# Patient Record
Sex: Female | Born: 1960 | Race: Black or African American | Hispanic: No | Smoking: Former smoker
Health system: Southern US, Community
[De-identification: ages and names within clinical notes are randomized; demographics above are authoritative.]

## PROBLEM LIST (undated history)

## (undated) DIAGNOSIS — M797 Fibromyalgia: Secondary | ICD-10-CM

## (undated) DIAGNOSIS — M2012 Hallux valgus (acquired), left foot: Secondary | ICD-10-CM

## (undated) DIAGNOSIS — J301 Allergic rhinitis due to pollen: Secondary | ICD-10-CM

## (undated) DIAGNOSIS — E119 Type 2 diabetes mellitus without complications: Secondary | ICD-10-CM

## (undated) DIAGNOSIS — M1990 Unspecified osteoarthritis, unspecified site: Secondary | ICD-10-CM

## (undated) DIAGNOSIS — I639 Cerebral infarction, unspecified: Secondary | ICD-10-CM

## (undated) DIAGNOSIS — N183 Chronic kidney disease, stage 3 unspecified: Secondary | ICD-10-CM

## (undated) DIAGNOSIS — Z87898 Personal history of other specified conditions: Secondary | ICD-10-CM

## (undated) DIAGNOSIS — K219 Gastro-esophageal reflux disease without esophagitis: Secondary | ICD-10-CM

## (undated) DIAGNOSIS — I509 Heart failure, unspecified: Secondary | ICD-10-CM

## (undated) DIAGNOSIS — I10 Essential (primary) hypertension: Secondary | ICD-10-CM

## (undated) DIAGNOSIS — E669 Obesity, unspecified: Secondary | ICD-10-CM

## (undated) DIAGNOSIS — J40 Bronchitis, not specified as acute or chronic: Secondary | ICD-10-CM

## (undated) DIAGNOSIS — E785 Hyperlipidemia, unspecified: Secondary | ICD-10-CM

## (undated) DIAGNOSIS — I428 Other cardiomyopathies: Secondary | ICD-10-CM

## (undated) DIAGNOSIS — F419 Anxiety disorder, unspecified: Secondary | ICD-10-CM

## (undated) DIAGNOSIS — Z8701 Personal history of pneumonia (recurrent): Secondary | ICD-10-CM

## (undated) DIAGNOSIS — M329 Systemic lupus erythematosus, unspecified: Secondary | ICD-10-CM

## (undated) DIAGNOSIS — R51 Headache: Secondary | ICD-10-CM

## (undated) HISTORY — DX: Obesity, unspecified: E66.9

## (undated) HISTORY — DX: Allergic rhinitis due to pollen: J30.1

## (undated) HISTORY — PX: TUBAL LIGATION: SHX77

## (undated) HISTORY — DX: Unspecified osteoarthritis, unspecified site: M19.90

## (undated) HISTORY — DX: Systemic lupus erythematosus, unspecified: M32.9

## (undated) HISTORY — DX: Hallux valgus (acquired), left foot: M20.12

## (undated) HISTORY — DX: Anxiety disorder, unspecified: F41.9

## (undated) HISTORY — DX: Other cardiomyopathies: I42.8

## (undated) HISTORY — PX: OTHER SURGICAL HISTORY: SHX169

---

## 2000-10-31 ENCOUNTER — Emergency Department (HOSPITAL_COMMUNITY): Admission: EM | Admit: 2000-10-31 | Discharge: 2000-10-31 | Payer: Self-pay | Admitting: Internal Medicine

## 2000-10-31 ENCOUNTER — Encounter: Payer: Self-pay | Admitting: Internal Medicine

## 2006-04-13 ENCOUNTER — Encounter: Payer: Self-pay | Admitting: Family Medicine

## 2006-04-20 ENCOUNTER — Encounter: Payer: Self-pay | Admitting: Family Medicine

## 2006-05-20 ENCOUNTER — Encounter: Payer: Self-pay | Admitting: Family Medicine

## 2007-04-07 ENCOUNTER — Emergency Department: Payer: Self-pay | Admitting: Unknown Physician Specialty

## 2009-09-05 ENCOUNTER — Ambulatory Visit: Payer: Self-pay | Admitting: Surgery

## 2010-11-09 HISTORY — PX: US ECHOCARDIOGRAPHY: HXRAD669

## 2010-11-20 ENCOUNTER — Encounter (HOSPITAL_COMMUNITY): Payer: Self-pay | Admitting: Pharmacy Technician

## 2010-11-25 ENCOUNTER — Encounter (HOSPITAL_COMMUNITY): Payer: Self-pay | Admitting: Pharmacy Technician

## 2010-12-05 ENCOUNTER — Encounter (HOSPITAL_COMMUNITY)
Admission: RE | Disposition: A | Discharge status: Home / Self Care | Payer: Self-pay | Source: Ambulatory Visit | Attending: Internal Medicine

## 2010-12-05 ENCOUNTER — Ambulatory Visit (HOSPITAL_COMMUNITY)
Admission: RE | Admit: 2010-12-05 | Discharge: 2010-12-05 | Disposition: A | Discharge status: Home / Self Care | Payer: Medicaid Other | Source: Ambulatory Visit | Attending: Internal Medicine | Admitting: Internal Medicine

## 2010-12-05 DIAGNOSIS — I509 Heart failure, unspecified: Secondary | ICD-10-CM | POA: Insufficient documentation

## 2010-12-05 DIAGNOSIS — I428 Other cardiomyopathies: Secondary | ICD-10-CM | POA: Insufficient documentation

## 2010-12-05 HISTORY — PX: LEFT HEART CATHETERIZATION WITH CORONARY ANGIOGRAM: SHX5451

## 2010-12-05 LAB — GLUCOSE, CAPILLARY
Glucose-Capillary: 125 mg/dL — ABNORMAL HIGH (ref 70–99)
Glucose-Capillary: 118 mg/dL — ABNORMAL HIGH (ref 70–99)

## 2010-12-05 SURGERY — LEFT HEART CATHETERIZATION WITH CORONARY ANGIOGRAM
Anesthesia: LOCAL

## 2010-12-05 MED ORDER — SODIUM CHLORIDE 0.9 % IV SOLN: 1.0000 mL/kg/h | INTRAVENOUS | Status: AC

## 2010-12-05 MED ORDER — SODIUM CHLORIDE 0.9 % IJ SOLN: 3.0000 mL | Freq: Two times a day (BID) | INTRAMUSCULAR | Status: DC

## 2010-12-05 MED ORDER — SODIUM CHLORIDE 0.9 % IJ SOLN: 3.0000 mL | INTRAMUSCULAR | Status: DC | PRN

## 2010-12-05 MED ORDER — VERAPAMIL HCL 2.5 MG/ML IV SOLN
INTRAVENOUS | Status: AC
  Filled 2010-12-05: qty 2

## 2010-12-05 MED ORDER — ASPIRIN 81 MG PO CHEW
CHEWABLE_TABLET | ORAL | Status: AC
  Administered 2010-12-05: 243 mg
  Filled 2010-12-05: qty 3

## 2010-12-05 MED ORDER — HEPARIN (PORCINE) IN NACL 2-0.9 UNIT/ML-% IJ SOLN
INTRAMUSCULAR | Status: AC
  Filled 2010-12-05: qty 2000

## 2010-12-05 MED ORDER — SODIUM CHLORIDE 0.9 % IV SOLN: INTRAVENOUS | Status: DC

## 2010-12-05 MED ORDER — OXYCODONE-ACETAMINOPHEN 5-325 MG PO TABS: 1.0000 | ORAL_TABLET | ORAL | Status: DC | PRN

## 2010-12-05 MED ORDER — ONDANSETRON HCL 4 MG/2ML IJ SOLN: 4.0000 mg | Freq: Four times a day (QID) | INTRAMUSCULAR | Status: DC | PRN

## 2010-12-05 MED ORDER — SODIUM CHLORIDE 0.9 % IV SOLN
INTRAVENOUS | Status: DC
  Administered 2010-12-05: 09:00:00 via INTRAVENOUS

## 2010-12-05 MED ORDER — ACETAMINOPHEN 325 MG PO TABS: 650.0000 mg | ORAL_TABLET | ORAL | Status: DC | PRN

## 2010-12-05 MED ORDER — SODIUM CHLORIDE 0.9 % IV SOLN: 250.0000 mL | INTRAVENOUS | Status: DC

## 2010-12-05 MED ORDER — LIDOCAINE HCL (PF) 1 % IJ SOLN
INTRAMUSCULAR | Status: AC
  Filled 2010-12-05: qty 30

## 2010-12-05 MED ORDER — ASPIRIN 81 MG PO CHEW: 324.0000 mg | CHEWABLE_TABLET | ORAL | Status: DC

## 2010-12-05 MED ORDER — NITROGLYCERIN 0.2 MG/ML ON CALL CATH LAB
INTRAVENOUS | Status: AC
  Filled 2010-12-05: qty 1

## 2010-12-05 MED ORDER — FENTANYL CITRATE 0.05 MG/ML IJ SOLN
INTRAMUSCULAR | Status: AC
  Filled 2010-12-05: qty 2

## 2010-12-05 MED ORDER — MIDAZOLAM HCL 2 MG/2ML IJ SOLN
INTRAMUSCULAR | Status: AC
  Filled 2010-12-05: qty 2

## 2010-12-05 NOTE — Op Note (Signed)
[facility]        CARDIAC CATHETERIZATION REPORT  Katherine Katherine [name]   [id number] 02/14/1960  Performing Cardiologist: Katherine Katherine [name] Primary Physician: No primary provider on file. Primary Cardiologist:  Katherine Katherine [name]  Procedures Performed:  Left Heart Catheterization via 5 Fr right femoral access  Left Ventriculography, (RAO/LAO)  for 100 ml total contrast  Native Coronary Angiography  Indication(s): New onset heart failure  History: 50 y.o. female who was recently presented to [facility] with new onset congestive heart failure. She was discharged for outpatient cardiology follow-up. EF was reportedly 25-30%, dilated, with global hypokinesis. She is referred for cardiac catheterization to r/o CAD.  Consent: Katherine procedure with Risks/Benefits/Alternatives and Indications was reviewed with Katherine Katherine [name].  All questions were answered.    Risks / Complications include, but not limited to: Death, MI, CVA/TIA, VF/VT (with defibrillation), Bradycardia (need for temporary pacer placement), contrast induced nephropathy , bleeding / bruising / hematoma / pseudoaneurysm, vascular or coronary injury (with possible emergent CT or Vascular Surgery), adverse medication reactions, infection.    Katherine Katherine [name] voices understanding and agree to proceed.    Risks of procedure as well as Katherine alternatives and risks of each were explained to Katherine (Katherine [name]/caregiver).  Consent for procedure obtained. Consent for signed by MD and Katherine [name] with RN witness -- placed on chart.  Procedure: Katherine Katherine [name] was brought to Katherine [facility] in Katherine fasting state and prepped and draped in Katherine usual sterile fashion for (Right groin) access.  Sterile technique was used including antiseptics, cap, gloves, gown, hand hygiene, mask and sheet.  Skin prep: Chlorhexidine;  Time Out: Verified Katherine [name] identification, verified procedure, site/side was marked, verified  correct Katherine [name] position, special equipment/implants available, medications/allergies/relevent history reviewed, required imaging and test results available.  Performed  + Allen's test  Katherine right radial artery was identified and Katherine area around it was anesthetized with 1cc of 1% lidocaine. Multiple attempts at accessing Katherine radial artery were made but do to Katherine pulse being weak and Katherine artery fairly deep, it could not be accessed. Attention was then turned to Katherine right groin. Katherine right femoral head was identified using tactile and fluoroscopic technique.  Katherine right groin was anesthetized with 20 cc 1% subcutaneous Lidocaine.  Katherine right Common Femoral Artery was accessed using Katherine Modified Seldinger Technique with placement of a antimicrobial bonded/coated single lumen (5 Fr) sheath was placed in Katherine left femoral artery using a smart ultrasound needle and Katherine Seldinger technique.  Katherine sheath was aspirated and flushed.    A 5 Fr JL4 Catheter was advanced of over a Standard J wire into Katherine ascending Aorta.  Katherine catheter was used to engage Katherine left coronary artery.  Multiple cineangiographic views of Katherine left coronary artery system(s) were performed. A 5 Fr JR4 Catheter was advanced of over a safety J wire into Katherine ascending Aorta.  Katherine catheter was used to engage Katherine right coronary artery.  Multiple cineangiographic views of Katherine right coronary artery system(s) were performed.) This catheter was then exchanged over Katherine safety J wire for an angled Pigtail catheter that was advanced across Katherine Aortic Valve.  LV hemodynamics were measured and Left Ventriculography was performed.  LV hemodynamics were then re-sampled, and Katherine catheter was pulled back across Katherine Aortic Valve for measurement of "pull-back" gradient.  Katherine catheter and Katherine wire was removed completely out of Katherine body.  A final contrast arteriogram was performed of Katherine arteriotomy showing an  adequate access position just below Katherine inguinal ligament.  Based  on this and her large body habitus, I decided to proceed with arterial closure. This was accomplished by Katherine cath lab staff with an Angioseal device. Immediate hemostasis was successful.  Katherine Katherine [name] was transported to Katherine holding area in stable condition.   Katherine Katherine [name]  was stable before, during and following Katherine procedure.   Katherine [name] did tolerate procedure well. There were not complications. EBL: 10 cc  Medications:  Sedation:  2 mg IV Versed, 50 IV mcg Fentanyl  Contrast:  100 ml Omnipaque    Hemodynamics:  Central Aortic Pressure / Mean Aortic Pressure: 148/81  LV Pressure / LV End diastolic Pressure:  18  Left Ventriculography:  EF:  20-25%  Wall Motion: global hypokinesis  Coronary Angiographic Data:  Angiographically normal coronary arteries  Impression: 1.  Angiographically normal coronary arteries 2.  Non-ischemic, dilated cardiomyopathy  Plan: 1.  Will optimize medications and outpatient work-up for NICM.  Katherine case and results was discussed with Katherine Katherine [name]. Katherine case and results was not discussed with Katherine Katherine [name]'s PCP.  Time Spend Directly with Katherine [name]:  60 minutes  Katherine Nose, MD Attending Cardiologist [facility]  Katherine Katherine [name] C 12/05/2010, 12:11 PM

## 2010-12-05 NOTE — H&P (Signed)
Pt. Seen and examined. My office note which is scanned from the past 30 days details the H&P and plans for heart catheterization. There have been no changes since this H&P.  Pt. Signed informed consent and wishes to proceed with LHC today.  Chrystie Nose, MD Attending Cardiologist [facility]

## 2010-12-09 ENCOUNTER — Encounter (HOSPITAL_COMMUNITY): Payer: Self-pay

## 2010-12-09 ENCOUNTER — Other Ambulatory Visit (HOSPITAL_COMMUNITY): Payer: Self-pay | Admitting: Internal Medicine

## 2010-12-18 ENCOUNTER — Other Ambulatory Visit (HOSPITAL_COMMUNITY): Payer: Self-pay | Admitting: Internal Medicine

## 2010-12-18 DIAGNOSIS — D869 Sarcoidosis, unspecified: Secondary | ICD-10-CM

## 2010-12-24 ENCOUNTER — Other Ambulatory Visit: Payer: Self-pay | Admitting: Internal Medicine

## 2010-12-25 ENCOUNTER — Ambulatory Visit (HOSPITAL_COMMUNITY)
Admission: RE | Admit: 2010-12-25 | Discharge: 2010-12-25 | Disposition: A | Discharge status: Home / Self Care | Payer: Medicaid Other | Source: Ambulatory Visit | Attending: Internal Medicine | Admitting: Internal Medicine

## 2010-12-25 DIAGNOSIS — D869 Sarcoidosis, unspecified: Secondary | ICD-10-CM

## 2010-12-25 DIAGNOSIS — I428 Other cardiomyopathies: Secondary | ICD-10-CM | POA: Insufficient documentation

## 2010-12-25 LAB — BASIC METABOLIC PANEL
BUN: 17 mg/dL (ref 6–23)
Calcium: 9.1 mg/dL (ref 8.4–10.5)
GFR calc Af Amer: 90 mL/min — ABNORMAL LOW (ref >90)
GFR calc non Af Amer: 77 mL/min — ABNORMAL LOW (ref >90)
Glucose, Bld: 206 mg/dL — ABNORMAL HIGH (ref 70–99)

## 2010-12-30 MED ORDER — GADOBENATE DIMEGLUMINE 529 MG/ML IV SOLN
30.0000 mL | Freq: Once | INTRAVENOUS | Status: AC
  Administered 2010-12-30: 30 mL via INTRAVENOUS

## 2011-02-09 ENCOUNTER — Other Ambulatory Visit: Payer: Self-pay | Admitting: Cardiovascular Disease

## 2011-02-12 MED ORDER — GENTAMICIN SULFATE 40 MG/ML IJ SOLN
80.0000 mg | INTRAMUSCULAR | Status: DC
  Filled 2011-02-12: qty 2

## 2011-02-12 MED ORDER — SODIUM CHLORIDE 0.9 % IV SOLN
INTRAVENOUS | Status: DC
  Administered 2011-02-13: 10:00:00 via INTRAVENOUS

## 2011-02-12 MED ORDER — SODIUM CHLORIDE 0.45 % IV SOLN: INTRAVENOUS | Status: DC

## 2011-02-12 MED ORDER — CEFAZOLIN SODIUM-DEXTROSE 2-3 GM-% IV SOLR
2.0000 g | INTRAVENOUS | Status: DC
  Filled 2011-02-12: qty 50

## 2011-02-12 MED ORDER — CHLORHEXIDINE GLUCONATE 4 % EX LIQD
60.0000 mL | Freq: Once | CUTANEOUS | Status: DC
  Filled 2011-02-12: qty 60

## 2011-02-13 ENCOUNTER — Encounter (HOSPITAL_COMMUNITY): Payer: Self-pay | Admitting: General Practice

## 2011-02-13 ENCOUNTER — Ambulatory Visit (HOSPITAL_COMMUNITY)
Admission: RE | Admit: 2011-02-13 | Discharge: 2011-02-14 | Disposition: A | Discharge status: Home / Self Care | Payer: Medicaid Other | Source: Ambulatory Visit | Attending: Cardiovascular Disease | Admitting: Cardiovascular Disease

## 2011-02-13 ENCOUNTER — Encounter (HOSPITAL_COMMUNITY)
Admission: RE | Disposition: A | Discharge status: Home / Self Care | Payer: Self-pay | Source: Ambulatory Visit | Attending: Cardiovascular Disease

## 2011-02-13 DIAGNOSIS — I428 Other cardiomyopathies: Secondary | ICD-10-CM | POA: Insufficient documentation

## 2011-02-13 DIAGNOSIS — Z95810 Presence of automatic (implantable) cardiac defibrillator: Secondary | ICD-10-CM

## 2011-02-13 HISTORY — DX: Headache: R51

## 2011-02-13 HISTORY — PX: IMPLANTABLE CARDIOVERTER DEFIBRILLATOR IMPLANT: SHX5473

## 2011-02-13 HISTORY — DX: Essential (primary) hypertension: I10

## 2011-02-13 HISTORY — DX: Heart failure, unspecified: I50.9

## 2011-02-13 HISTORY — DX: Cerebral infarction, unspecified: I63.9

## 2011-02-13 HISTORY — PX: OTHER SURGICAL HISTORY: SHX169

## 2011-02-13 HISTORY — DX: Gastro-esophageal reflux disease without esophagitis: K21.9

## 2011-02-13 HISTORY — DX: Bronchitis, not specified as acute or chronic: J40

## 2011-02-13 HISTORY — DX: Fibromyalgia: M79.7

## 2011-02-13 LAB — SURGICAL PCR SCREEN
MRSA, PCR: NEGATIVE
Staphylococcus aureus: NEGATIVE

## 2011-02-13 SURGERY — IMPLANTABLE CARDIOVERTER DEFIBRILLATOR IMPLANT
Anesthesia: LOCAL

## 2011-02-13 MED ORDER — CEFAZOLIN SODIUM 1-5 GM-% IV SOLN
1.0000 g | Freq: Four times a day (QID) | INTRAVENOUS | Status: AC
  Administered 2011-02-13 – 2011-02-14 (×3): 1 g via INTRAVENOUS
  Filled 2011-02-13 (×3): qty 50

## 2011-02-13 MED ORDER — BUPROPION HCL ER (SR) 150 MG PO TB12
150.0000 mg | ORAL_TABLET | ORAL | Status: DC
  Administered 2011-02-14: 150 mg via ORAL
  Filled 2011-02-13 (×2): qty 1

## 2011-02-13 MED ORDER — SIMVASTATIN 20 MG PO TABS
20.0000 mg | ORAL_TABLET | Freq: Every day | ORAL | Status: DC
  Administered 2011-02-13: 20 mg via ORAL
  Filled 2011-02-13 (×2): qty 1

## 2011-02-13 MED ORDER — ACETAMINOPHEN 325 MG PO TABS
325.0000 mg | ORAL_TABLET | ORAL | Status: DC | PRN
Start: 2011-02-13 — End: 2011-02-14

## 2011-02-13 MED ORDER — LOSARTAN POTASSIUM 50 MG PO TABS
100.0000 mg | ORAL_TABLET | Freq: Every day | ORAL | Status: DC
  Administered 2011-02-14: 100 mg via ORAL
  Filled 2011-02-13 (×2): qty 2

## 2011-02-13 MED ORDER — LORATADINE 10 MG PO TABS
10.0000 mg | ORAL_TABLET | Freq: Every day | ORAL | Status: DC | PRN
  Administered 2011-02-13: 10 mg via ORAL
  Filled 2011-02-13: qty 1

## 2011-02-13 MED ORDER — LORAZEPAM 0.5 MG PO TABS: 0.5000 mg | ORAL_TABLET | Freq: Every day | ORAL | Status: DC | PRN

## 2011-02-13 MED ORDER — HEPARIN (PORCINE) IN NACL 2-0.9 UNIT/ML-% IJ SOLN
INTRAMUSCULAR | Status: AC
  Filled 2011-02-13: qty 1000

## 2011-02-13 MED ORDER — PROMETHAZINE HCL 25 MG PO TABS: 25.0000 mg | ORAL_TABLET | Freq: Three times a day (TID) | ORAL | Status: DC | PRN

## 2011-02-13 MED ORDER — ALBUTEROL SULFATE HFA 108 (90 BASE) MCG/ACT IN AERS
1.0000 | INHALATION_SPRAY | Freq: Four times a day (QID) | RESPIRATORY_TRACT | Status: DC | PRN
  Filled 2011-02-13: qty 6.7

## 2011-02-13 MED ORDER — ASPIRIN EC 81 MG PO TBEC
81.0000 mg | DELAYED_RELEASE_TABLET | Freq: Every day | ORAL | Status: DC
  Administered 2011-02-14: 81 mg via ORAL
  Filled 2011-02-13 (×2): qty 1

## 2011-02-13 MED ORDER — CLONIDINE HCL 0.3 MG PO TABS
0.3000 mg | ORAL_TABLET | Freq: Two times a day (BID) | ORAL | Status: DC
  Administered 2011-02-13 – 2011-02-14 (×2): 0.3 mg via ORAL
  Filled 2011-02-13 (×4): qty 1

## 2011-02-13 MED ORDER — CLOPIDOGREL BISULFATE 75 MG PO TABS
75.0000 mg | ORAL_TABLET | Freq: Every day | ORAL | Status: DC
  Administered 2011-02-14: 75 mg via ORAL
  Filled 2011-02-13 (×2): qty 1

## 2011-02-13 MED ORDER — DIPHENHYDRAMINE HCL 50 MG/ML IJ SOLN
INTRAMUSCULAR | Status: AC
  Filled 2011-02-13: qty 1

## 2011-02-13 MED ORDER — FLUTICASONE-SALMETEROL 250-50 MCG/DOSE IN AEPB
1.0000 | INHALATION_SPRAY | RESPIRATORY_TRACT | Status: DC
  Administered 2011-02-14: 1 via RESPIRATORY_TRACT
  Filled 2011-02-13: qty 14

## 2011-02-13 MED ORDER — FENTANYL CITRATE 0.05 MG/ML IJ SOLN
INTRAMUSCULAR | Status: AC
  Filled 2011-02-13: qty 2

## 2011-02-13 MED ORDER — HYDROCODONE-ACETAMINOPHEN 5-325 MG PO TABS
1.0000 | ORAL_TABLET | ORAL | Status: DC | PRN
  Administered 2011-02-13 – 2011-02-14 (×4): 2 via ORAL
  Filled 2011-02-13 (×4): qty 2

## 2011-02-13 MED ORDER — GABAPENTIN 300 MG PO CAPS
300.0000 mg | ORAL_CAPSULE | Freq: Three times a day (TID) | ORAL | Status: DC
  Administered 2011-02-13 – 2011-02-14 (×3): 300 mg via ORAL
  Filled 2011-02-13 (×5): qty 1

## 2011-02-13 MED ORDER — CEFAZOLIN SODIUM-DEXTROSE 2-3 GM-% IV SOLR
INTRAVENOUS | Status: AC
  Filled 2011-02-13: qty 50

## 2011-02-13 MED ORDER — MIDAZOLAM HCL 5 MG/5ML IJ SOLN
INTRAMUSCULAR | Status: AC
  Filled 2011-02-13: qty 5

## 2011-02-13 MED ORDER — LIDOCAINE HCL (PF) 1 % IJ SOLN
INTRAMUSCULAR | Status: AC
  Filled 2011-02-13: qty 60

## 2011-02-13 MED ORDER — ZOLPIDEM TARTRATE 5 MG PO TABS: 10.0000 mg | ORAL_TABLET | Freq: Every evening | ORAL | Status: DC | PRN

## 2011-02-13 MED ORDER — METFORMIN HCL 500 MG PO TABS
500.0000 mg | ORAL_TABLET | Freq: Two times a day (BID) | ORAL | Status: DC
Start: 2011-02-13 — End: 2011-02-14
  Administered 2011-02-13 – 2011-02-14 (×2): 500 mg via ORAL
  Filled 2011-02-13 (×4): qty 1

## 2011-02-13 MED ORDER — MUPIROCIN 2 % EX OINT
TOPICAL_OINTMENT | Freq: Two times a day (BID) | CUTANEOUS | Status: DC
  Administered 2011-02-13 – 2011-02-14 (×2): via NASAL
  Filled 2011-02-13: qty 22

## 2011-02-13 MED ORDER — LOSARTAN POTASSIUM-HCTZ 100-25 MG PO TABS
1.0000 | ORAL_TABLET | Freq: Every day | ORAL | Status: DC
Start: 2011-02-13 — End: 2011-02-13

## 2011-02-13 MED ORDER — FUROSEMIDE 40 MG PO TABS
40.0000 mg | ORAL_TABLET | Freq: Two times a day (BID) | ORAL | Status: DC
  Administered 2011-02-13 – 2011-02-14 (×2): 40 mg via ORAL
  Filled 2011-02-13 (×4): qty 1

## 2011-02-13 MED ORDER — METOPROLOL TARTRATE 50 MG PO TABS
50.0000 mg | ORAL_TABLET | Freq: Two times a day (BID) | ORAL | Status: DC
  Administered 2011-02-13 – 2011-02-14 (×2): 50 mg via ORAL
  Filled 2011-02-13 (×4): qty 1

## 2011-02-13 MED ORDER — SODIUM CHLORIDE 0.9 % IV SOLN: INTRAVENOUS | Status: DC

## 2011-02-13 MED ORDER — HYDROXYCHLOROQUINE SULFATE 200 MG PO TABS
200.0000 mg | ORAL_TABLET | Freq: Two times a day (BID) | ORAL | Status: DC
Start: 2011-02-13 — End: 2011-02-14
  Administered 2011-02-13 – 2011-02-14 (×2): 200 mg via ORAL
  Filled 2011-02-13 (×4): qty 1

## 2011-02-13 MED ORDER — ONDANSETRON HCL 4 MG/2ML IJ SOLN: 4.0000 mg | Freq: Four times a day (QID) | INTRAMUSCULAR | Status: DC | PRN

## 2011-02-13 MED ORDER — PANTOPRAZOLE SODIUM 40 MG PO TBEC
40.0000 mg | DELAYED_RELEASE_TABLET | Freq: Every day | ORAL | Status: DC
  Administered 2011-02-14: 40 mg via ORAL
  Filled 2011-02-13: qty 1

## 2011-02-13 MED ORDER — VENLAFAXINE HCL ER 150 MG PO CP24
225.0000 mg | ORAL_CAPSULE | Freq: Every day | ORAL | Status: DC
  Administered 2011-02-14: 225 mg via ORAL
  Filled 2011-02-13 (×2): qty 1

## 2011-02-13 MED ORDER — HYDROCHLOROTHIAZIDE 25 MG PO TABS
25.0000 mg | ORAL_TABLET | Freq: Every day | ORAL | Status: DC
  Administered 2011-02-14: 25 mg via ORAL
  Filled 2011-02-13 (×2): qty 1

## 2011-02-13 NOTE — Op Note (Signed)
Procedure report  Procedure performed:  1. Implantation of new dual chamber cardioverter defibrillator  2. Fluoroscopy  3. Moderate sedation  4. Initial lead and generator testing    Reason for procedure:  Primary prevention of sudden cardiac death Nonischemic cardiomyopathy,  left ventricular ejection fraction less than 35%, Heart failure NYHA class 2, on comprehensive medical therapy for over 90 days (SCD-HeFT)  Procedure performed by: Thurmon Fair, MD  Complications: None  Estimated blood loss: <10 mL  Medications administered during procedure: Ancef 2 g intravenously Benadryl 25 mg intravenously Lidocaine 1% 30 mL locally,  Fentanyl 100 mcg intravenously Versed 5 mg intravenously  Device details: Generator Medtronic protecta model D314 DRM serial number [id number] Right atrial lead Medtronic 5076-52 cm serial number [id number] Right ventricular lead Medtronic Sprint Quattro secure 06/28/1938 38M-62 cm dual coil defibrillator lead serial number [id number]  Procedure details:  After the risks and benefits of the procedure were discussed the patient provided informed consent and was brought to the cardiac cath lab in the fasting state. The patient was prepped and draped in usual sterile fashion. Local anesthesia with 1% lidocaine was administered to to the left infraclavicular area. A 5-6 cm horizontal incision was made parallel with and 2-3 cm caudal to the left clavicle. Using electrocautery and blunt dissection a prepectoral pocket was created down to the level of the pectoralis major muscle fascia. The pocket was carefully inspected for hemostasis. An antibiotic-soaked sponge was placed in the pocket.  Under fluoroscopic guidance and using the modified Seldinger technique 2 separate venipunctures were performed to access the left subclavian vein. no difficulty was encountered accessing the vein.  Two J-tip guidewires were subsequently exchanged for 9.5 [place] and   7 [place] safe sheaths.  Under fluoroscopic guidance the ventricular lead was advanced to level of the mid to apical right ventricular septum and thet active-fixation helix was deployed. Prominent current of injury was seen. Satisfactory pacing and sensing parameters were recorded. There was no evidence of diaphragmatic stimulation at maximum device output. The safe sheath was peeled away and the lead was secured in place with 2-0 silk.  In similar fashion the right atrial lead was advanced to the level of the atrial appendage. The active-fixation helix was deployed. There was prominent current of injury. Satisfactory  pacing and sensing parameters were recorded. There was no evidence of diaphragmatic stimulation with pacing at maximum device output. The safe sheath was peeled away and the lead was secured in place with 2-0 silk.  The antibiotic-soaked sponge was removed from the pocket. The pocket was flushed with copious amounts of antibiotic solution. Reinspection showed excellent hemostasis.  The ventricular lead was connected to the generator and appropriate ventricular pacing was seen. Subsequently the atrial lead was also connected. Repeat testing of the lead parameters later showed excellent values.  The entire system was then carefully inserted in the pocket with care been taking that the leads and device assumed a comfortable position without pressure on the incision. Great care was taken that the leads be located deep to the generator. The pocket was then closed in layers using 2 layers of 2-0 Vicryl and cutaneous staples, after which a sterile dressing was applied.  Defibrillation threshold testing was then performed. After adequate sedation was achieved, ventricular fibrillation was induced with a 1J shock on T method. There was appropriate sensing by the device. There were no dropouts during "least sensitive" settings. The arrhythmia was terminated by a single 15J shock. High voltage  impedance  during the shock was 39 ohms. Retesting of the leads confirmed normal function.  At the end of the procedure the following lead parameters were encountered:  Right atrial lead  sensed P waves 2.3 mV, impedance 703 ohms, threshold 0.9 V at 0.5 ms pulse width.  Right ventricular lead sensed R waves 13.5 mV, impedance 995 ohms, threshold 0.5 V at 0.5 ms pulse width.  High voltage impedance 39 ohms, charge time 2.6 seconds  Thurmon Fair, MD, [facility] [phone] 02/13/2011 11:49 AM

## 2011-02-13 NOTE — H&P (Signed)
History reviewed, patient examined, no change in status, stable for surgery. Meets SCD-HFT criteria for ICD as primary prevention. Risks and benefits discussed and she agrees to proceed. Thurmon Fair, MD, [facility] [phone]  02/13/2011 8:43 AM

## 2011-02-14 ENCOUNTER — Ambulatory Visit (HOSPITAL_COMMUNITY): Payer: Medicaid Other

## 2011-02-14 ENCOUNTER — Other Ambulatory Visit: Payer: Self-pay

## 2011-02-14 NOTE — Progress Notes (Signed)
Pt. Discharged 02/14/2011  12:13 PM Discharge instructions reviewed with patient/family. Patient/family verbalized understanding. All Rx's given. Questions answered as needed. Pt. Discharged to home with family/self. Taken off unit via W/C. Elease Hashimoto RN

## 2011-02-14 NOTE — Progress Notes (Signed)
[facility]  Subjective: No complaints.  Objective: Vital signs in last 24 hours: Temp:  [98.3 F (36.8 C)-98.9 F (37.2 C)] 98.9 F (37.2 C) (01/26 0508) Pulse Rate:  [78-90] 78  (01/26 0753) Resp:  [19-21] 21  (01/26 0753) BP: (133-166)/(67-105) 166/75 mmHg (01/26 0508) SpO2:  [95 %-97 %] 97 % (01/26 0508) Last BM Date: 02/12/11  Intake/Output from previous day: 01/25 0701 - 01/26 0700 In: 1200 [P.O.:1200] Out: -  Intake/Output this shift:    Medications Current Facility-Administered Medications  Medication Dose Route Frequency Provider Last Rate Last Dose  . 0.9 %  sodium chloride infusion   Intravenous Continuous Mihai Croitoru, MD      . acetaminophen (TYLENOL) tablet 325-650 mg  325-650 mg Oral Q4H PRN Mihai Croitoru, MD      . albuterol (PROVENTIL HFA;VENTOLIN HFA) 108 (90 BASE) MCG/ACT inhaler 1 puff  1 puff Inhalation Q6H PRN Mihai Croitoru, MD      . aspirin EC tablet 81 mg  81 mg Oral Daily Mihai Croitoru, MD      . buPROPion (WELLBUTRIN SR) 12 hr tablet 150 mg  150 mg Oral Q0700 Mihai Croitoru, MD   150 mg at 02/14/11 0631  . ceFAZolin (ANCEF) 2-3 GM-% IVPB SOLR           . ceFAZolin (ANCEF) IVPB 1 g/50 mL premix  1 g Intravenous Q6H Mihai Croitoru, MD   1 g at 02/14/11 0128  . cloNIDine (CATAPRES) tablet 0.3 mg  0.3 mg Oral BID Thurmon Fair, MD   0.3 mg at 02/13/11 2208  . clopidogrel (PLAVIX) tablet 75 mg  75 mg Oral Daily Mihai Croitoru, MD      . Fluticasone-Salmeterol (ADVAIR) 250-50 MCG/DOSE inhaler 1 puff  1 puff Inhalation Q0700 Mihai Croitoru, MD   1 puff at 02/14/11 0753  . furosemide (LASIX) tablet 40 mg  40 mg Oral BID Thurmon Fair, MD   40 mg at 02/14/11 0817  . gabapentin (NEURONTIN) capsule 300 mg  300 mg Oral TID Thurmon Fair, MD   300 mg at 02/13/11 2208  . hydrochlorothiazide (HYDRODIURIL) tablet 25 mg  25 mg Oral Daily Mihai Croitoru, MD      . HYDROcodone-acetaminophen (NORCO) 5-325 MG per tablet 1-2 tablet   1-2 tablet Oral Q4H PRN Thurmon Fair, MD   2 tablet at 02/14/11 0541  . hydroxychloroquine (PLAQUENIL) tablet 200 mg  200 mg Oral BID Thurmon Fair, MD   200 mg at 02/13/11 2208  . loratadine (CLARITIN) tablet 10 mg  10 mg Oral Daily PRN Thurmon Fair, MD   10 mg at 02/13/11 1744  . LORazepam (ATIVAN) tablet 0.5-1 mg  0.5-1 mg Oral Daily PRN Mihai Croitoru, MD      . losartan (COZAAR) tablet 100 mg  100 mg Oral Daily Mihai Croitoru, MD      . metFORMIN (GLUCOPHAGE) tablet 500 mg  500 mg Oral BID WC Mihai Croitoru, MD   500 mg at 02/14/11 0708  . metoprolol (LOPRESSOR) tablet 50 mg  50 mg Oral BID Thurmon Fair, MD   50 mg at 02/13/11 2208  . midazolam (VERSED) 5 MG/5ML injection           . mupirocin ointment (BACTROBAN) 2 %   Nasal BID Mihai Croitoru, MD      . ondansetron (ZOFRAN) injection 4 mg  4 mg Intravenous Q6H PRN Mihai Croitoru, MD      . pantoprazole (PROTONIX) EC tablet 40 mg  40 mg Oral Q1200 Mihai Croitoru, MD      . promethazine (PHENERGAN) tablet 25 mg  25 mg Oral Q8H PRN Mihai Croitoru, MD      . simvastatin (ZOCOR) tablet 20 mg  20 mg Oral QHS Mihai Croitoru, MD   20 mg at 02/13/11 2208  . venlafaxine (EFFEXOR-XR) 24 hr capsule 225 mg  225 mg Oral Q breakfast Mihai Croitoru, MD   225 mg at 02/14/11 0631  . zolpidem (AMBIEN) tablet 10 mg  10 mg Oral QHS PRN Abelino Derrick, PA      . DISCONTD: 0.45 % sodium chloride infusion   Intravenous Continuous Mihai Croitoru, MD      . DISCONTD: 0.9 %  sodium chloride infusion   Intravenous Continuous Mihai Croitoru, MD 50 mL/hr at 02/13/11 0937    . DISCONTD: ceFAZolin (ANCEF) IVPB 2 g/50 mL premix  2 g Intravenous On Call Thurmon Fair, MD      . DISCONTD: chlorhexidine (HIBICLENS) 4 % liquid 4 application  60 mL Topical Once Mihai Croitoru, MD      . DISCONTD: gentamicin (GARAMYCIN) 80 mg in sodium chloride irrigation 0.9 % 500 mL irrigation  80 mg Irrigation On Call Thurmon Fair, MD      . DISCONTD: losartan-hydrochlorothiazide  (HYZAAR) 100-25 MG per tablet 1 tablet  1 tablet Oral Daily Mihai Croitoru, MD        PE: General appearance: alert, cooperative and no distress Lungs: clear to auscultation bilaterally Heart: regular rate and rhythm, S1, S2 normal, no murmur, click, rub or gallop Extremities: 1+ LEE Pulses: 2+ and symmetric  Lab Results:  No results found for this basename: WBC:3,HGB:3,HCT:3,PLT:3 in the last 72 hours BMET  Studies/Results: Procedure performed:  1. Implantation of new dual chamber cardioverter defibrillator  2. Fluoroscopy  3. Moderate sedation  4. Initial lead and generator testing  Reason for procedure:  Primary prevention of sudden cardiac death  Nonischemic cardiomyopathy, left ventricular ejection fraction less than 35%, Heart failure NYHA class 2, on comprehensive medical therapy for over 90 days (SCD-HeFT)    Assessment/Plan  Active Problems: Cardiomyopathy  Plan:  S/P ICD for primary prevention.  No Pneumothorax.  No new bleeding from incision site.    LOS: 1 day    HAGER,BRYAN W 02/14/2011 10:35 AM   Agree with note written by Jones Skene PAC  S/P ICD implant by Dr. Mitchell Heir for primary prevention. Looks good. CXR ok. Interrogated by rep this am. Exam benign. OK for D/C home ROV with Dr. Mitchell Heir 1-2 weeks then back to Dr. Rennis Golden.  Runell Gess 02/14/2011 10:41 AM

## 2012-05-24 ENCOUNTER — Telehealth (HOSPITAL_COMMUNITY): Payer: Self-pay | Admitting: *Deleted

## 2012-06-08 ENCOUNTER — Telehealth (HOSPITAL_COMMUNITY): Payer: Self-pay | Admitting: *Deleted

## 2012-06-17 ENCOUNTER — Other Ambulatory Visit: Payer: Self-pay

## 2012-06-17 MED ORDER — CLOPIDOGREL BISULFATE 75 MG PO TABS: 75.0000 mg | ORAL_TABLET | Freq: Every day | ORAL | Status: DC

## 2012-06-17 NOTE — Telephone Encounter (Signed)
Rx was sent to pharmacy electronically. 

## 2012-08-06 ENCOUNTER — Encounter: Payer: Self-pay | Admitting: *Deleted

## 2012-08-11 ENCOUNTER — Encounter: Payer: Self-pay | Admitting: Internal Medicine

## 2012-08-12 ENCOUNTER — Ambulatory Visit: Payer: Medicaid Other | Admitting: Cardiovascular Disease

## 2012-09-08 ENCOUNTER — Other Ambulatory Visit: Payer: Self-pay

## 2012-09-08 MED ORDER — SPIRONOLACTONE 25 MG PO TABS: 25.0000 mg | ORAL_TABLET | Freq: Every day | ORAL | Status: DC

## 2012-09-08 MED ORDER — FUROSEMIDE 40 MG PO TABS: 40.0000 mg | ORAL_TABLET | Freq: Two times a day (BID) | ORAL | Status: DC

## 2012-09-08 NOTE — Telephone Encounter (Signed)
Rx was sent to pharmacy electronically. 

## 2012-09-29 ENCOUNTER — Other Ambulatory Visit: Payer: Self-pay | Admitting: *Deleted

## 2012-09-29 MED ORDER — METOPROLOL TARTRATE 50 MG PO TABS: 50.0000 mg | ORAL_TABLET | Freq: Three times a day (TID) | ORAL | Status: DC

## 2012-09-29 NOTE — Telephone Encounter (Signed)
Metoprolol tart 50mg  refilled electronically #90 w/0 refills.

## 2012-10-03 ENCOUNTER — Other Ambulatory Visit: Payer: Self-pay | Admitting: Cardiovascular Disease

## 2012-10-03 ENCOUNTER — Ambulatory Visit (INDEPENDENT_AMBULATORY_CARE_PROVIDER_SITE_OTHER): Payer: Medicaid Other | Admitting: Internal Medicine

## 2012-10-03 ENCOUNTER — Encounter: Payer: Self-pay | Admitting: Internal Medicine

## 2012-10-03 VITALS — BP 134/90 | HR 74 | Ht 70.5 in | Wt 301.4 lb

## 2012-10-03 DIAGNOSIS — E119 Type 2 diabetes mellitus without complications: Secondary | ICD-10-CM

## 2012-10-03 DIAGNOSIS — R0602 Shortness of breath: Secondary | ICD-10-CM

## 2012-10-03 DIAGNOSIS — M797 Fibromyalgia: Secondary | ICD-10-CM | POA: Insufficient documentation

## 2012-10-03 DIAGNOSIS — I10 Essential (primary) hypertension: Secondary | ICD-10-CM

## 2012-10-03 DIAGNOSIS — E1122 Type 2 diabetes mellitus with diabetic chronic kidney disease: Secondary | ICD-10-CM | POA: Insufficient documentation

## 2012-10-03 DIAGNOSIS — IMO0002 Reserved for concepts with insufficient information to code with codable children: Secondary | ICD-10-CM

## 2012-10-03 DIAGNOSIS — IMO0001 Reserved for inherently not codable concepts without codable children: Secondary | ICD-10-CM

## 2012-10-03 DIAGNOSIS — I639 Cerebral infarction, unspecified: Secondary | ICD-10-CM

## 2012-10-03 DIAGNOSIS — I6350 Cerebral infarction due to unspecified occlusion or stenosis of unspecified cerebral artery: Secondary | ICD-10-CM

## 2012-10-03 DIAGNOSIS — Z95810 Presence of automatic (implantable) cardiac defibrillator: Secondary | ICD-10-CM

## 2012-10-03 DIAGNOSIS — I428 Other cardiomyopathies: Secondary | ICD-10-CM | POA: Insufficient documentation

## 2012-10-03 DIAGNOSIS — Z79899 Other long term (current) drug therapy: Secondary | ICD-10-CM

## 2012-10-03 DIAGNOSIS — E785 Hyperlipidemia, unspecified: Secondary | ICD-10-CM

## 2012-10-03 DIAGNOSIS — M329 Systemic lupus erythematosus, unspecified: Secondary | ICD-10-CM

## 2012-10-03 HISTORY — DX: Fibromyalgia: M79.7

## 2012-10-03 LAB — ICD DEVICE OBSERVATION

## 2012-10-03 NOTE — Patient Instructions (Addendum)
Your physician recommends that you return for lab work TODAY. BNP CMP We will call you with the results and make any necessary changes based off the results.

## 2012-10-03 NOTE — Progress Notes (Signed)
OFFICE NOTE  Chief Complaint:  Increasing shortness of breath, orthopnea, weight gain  Primary Care Physician: Rush Barer, PA-C  HPI:  Katherine [name] is a 52 year old female with history of nonischemic cardiomyopathy, EF about 20% to 25%. She had heart catheterization which showed fairly normal coronaries and a cardiac MRI which showed a dilated LV and severe global hypokinesis and EF of 28%. No evidence of delayed enhancement, infiltrative disease, or LV noncompaction. She had an AICD placed which was a Barrister's clerk DR XT device. This was interrogated by Dr. Royann Shivers in July 2013. She had no events; however, did have some increased impedance with OptiVol. She did have a small amount of weight gain and he had increased her Lasix and started her on Aldactone. When I last saw her in April of this year her weight has been stable and she was asymptomatic. About 6 or 7 weeks ago she started to have more swelling and shortness of breath as well as weight gain. She also had a flare of her lupus and felt that her symptoms were possibly due to fibromyalgia. When her swelling became worse she increased her Lasix from 40 mg every morning and 20 mg every afternoon to 80 mg 3 times a day. She did not notify the office if this change and initially had some improvement in her swelling but notes that at some point she plateaued. She also feels like she gets occasional sharp twinges from her defibrillator which she thinks are shocks. She recently saw her primary care provider and has been having some problems with worsening kidney function per her report and was referred to a nephrologist who she will be seeing. I'm concerned about her increased lasix use and history of renal dysfunction.  PMHx:  Past Medical History  Diagnosis Date  . Angina   . Asthma   . Shortness of breath   . Bronchitis   . Lupus   . Seizures     per patient statement  . Hypertension   . Stroke   . CHF (congestive heart  failure)   . Pneumonia   . GERD (gastroesophageal reflux disease)   . Headache(784.0)   . Diabetes mellitus   . Fibromyalgia   . Nonischemic cardiomyopathy     NYHA classII  . Fibromyalgia 10/03/2012    Past Surgical History  Procedure Laterality Date  . Icd  02/13/2011    Medtronic Protecta DR XT  . Tubal ligation    . Right ankle    . US echocardiography  11/09/2010    mild LV enlargement w/conc. LVH & severe global hypokinesis EF 30-35%,mod. diastolic dysfunction, mod. TR,mild AI,mild to mod MR,mild PI    FAMHx:  History reviewed. No pertinent family history.  SOCHx:   reports that she quit smoking about 7 years ago. She has never used smokeless tobacco. She reports that  drinks alcohol. She reports that she does not use illicit drugs.  ALLERGIES:  Allergies  Allergen Reactions  . Penicillins Itching    Can take amoxicillin without a reaction  . Shellfish Allergy Other (See Comments)    Causes to have grand mal seizures    ROS: A comprehensive review of systems was negative except for: Constitutional: positive for fatigue Respiratory: positive for dyspnea on exertion Cardiovascular: positive for orthopnea and paroxysmal nocturnal dyspnea  HOME MEDS: Current Outpatient Prescriptions  Medication Sig Dispense Refill  . albuterol (PROVENTIL HFA;VENTOLIN HFA) 108 (90 BASE) MCG/ACT inhaler Inhale 2 puffs into the lungs every 6 (six)  hours as needed. For wheezing and shortness of breath      . albuterol (PROVENTIL) (2.5 MG/3ML) 0.083% nebulizer solution Take 2.5 mg by nebulization every 6 (six) hours as needed. For wheezing and shortness of breath       . aspirin EC 81 MG tablet Take 81 mg by mouth daily.        . cloNIDine (CATAPRES) 0.3 MG tablet Take 0.2 mg by mouth 2 (two) times daily.       . clopidogrel (PLAVIX) 75 MG tablet Take 1 tablet (75 mg total) by mouth daily.  30 tablet  11  . Fluticasone-Salmeterol (ADVAIR) 250-50 MCG/DOSE AEPB Inhale 1 puff into the lungs  every morning.        . furosemide (LASIX) 40 MG tablet Take 80 mg by mouth 3 (three) times daily.      Marland Kitchen gabapentin (NEURONTIN) 300 MG capsule Take 300 mg by mouth 3 (three) times daily.        . hydroxychloroquine (PLAQUENIL) 200 MG tablet Take 200 mg by mouth 2 (two) times daily.        Marland Kitchen loratadine (CLARITIN) 10 MG tablet Take 10 mg by mouth daily as needed. For allergies       . LORazepam (ATIVAN) 1 MG tablet Take 0.5-1 mg by mouth daily as needed. anxiety       . losartan (COZAAR) 100 MG tablet Take 100 mg by mouth daily.      . metoprolol (LOPRESSOR) 50 MG tablet Take 1 tablet (50 mg total) by mouth 3 (three) times daily.  90 tablet  0  . omeprazole (PRILOSEC) 20 MG capsule Take 20 mg by mouth 2 (two) times daily.        . promethazine (PHENERGAN) 25 MG tablet Take 25 mg by mouth every 8 (eight) hours as needed. nausea       . simvastatin (ZOCOR) 20 MG tablet Take 20 mg by mouth at bedtime.        . sitaGLIPtin (JANUVIA) 50 MG tablet Take 50 mg by mouth daily.      Marland Kitchen spironolactone (ALDACTONE) 25 MG tablet Take 1 tablet (25 mg total) by mouth daily.  90 tablet  2  . venlafaxine (EFFEXOR-XR) 75 MG 24 hr capsule Take 450 mg by mouth daily.        No current facility-administered medications for this visit.    LABS/IMAGING: No results found for this or any previous visit (from the past 48 hour(s)). No results found.  VITALS: BP 134/90  Pulse 74  Ht 5' 10.5" (1.791 m)  Wt 301 lb 6.4 oz (136.714 kg)  BMI 42.62 kg/m2  EXAM: General appearance: alert, mild distress and morbidly obese Neck: JVD - 5 cm above sternal notch, no adenopathy, no carotid bruit, supple, symmetrical, trachea midline and thyroid not enlarged, symmetric, no tenderness/mass/nodules Lungs: diminished breath sounds bibasilar and rales bibasilar Heart: regular rate and rhythm Abdomen: obese, distended and somewhat firm, no rebound, TTP or guarding Extremities: edema 1-2+ bilateral pitting Pulses: 2+ and  symmetric Skin: Skin color, texture, turgor normal. No rashes or lesions Neurologic: Grossly normal  EKG: Normal sinus rhythm at 74  ASSESSMENT: 1. Acute on chronic systolic heart failure exacerbation 2. Nonischemic cardiomyopathy, EF 28% 2013 by cardiac MRI 3. Status post Medtronic Protecta XT AICD 4. Reported renal dysfunction  PLAN: 1.   Mrs. Pardue has had worsening shortness of breath, 4 pillow orthopnea, lower extremity edema and fatigue. She increased her Lasix doses on her  own to try to compensate for this and is now taking more than 3 times as much Lasix as she had previously. I'm concerned about her renal function and electrolytes in the setting of known medical renal disease. It is also unclear what led to her exacerbation she reports no significant dietary changes or lapses in medication. She did have a flare of her lupus, she felt at the same time.  Interrogation of her ICD today demonstrated a definite increase in her thoracic impedance in June and July. She also had an episode of VT which self terminated just prior to any treatment. She was aware of that episode. There were no shocks recorded. The device appears to be functioning appropriately. At this point, I would recommend that laboratory work today to evaluate her BNP and comprehensive metabolic profile. Based on her renal function I may elect to either add Zaroxolyn for additional diuresis or she may need IV diuretics.  If for some reason her renal function is worse, she may even need hospitalization. Will plan to contact her tomorrow with those results.  Chrystie Nose, MD, [facility] Attending Cardiologist [facility]  HILTY,Kenneth C 10/03/2012, 12:47 PM

## 2012-10-04 LAB — ICD DEVICE OBSERVATION
AL THRESHOLD: 0.5 V
ATRIAL PACING ICD: 0.7 pct
FVT: 0
RV LEAD IMPEDENCE ICD: 361 Ohm
RV LEAD THRESHOLD: 0.5 V
TZON-0003VSLOWVT: 359.2 ms
VF: 1

## 2012-10-04 LAB — COMPREHENSIVE METABOLIC PANEL
Albumin: 3.9 g/dL (ref 3.5–5.2)
BUN: 17 mg/dL (ref 6–23)
Calcium: 9.2 mg/dL (ref 8.4–10.5)
Chloride: 103 mEq/L (ref 96–112)
Glucose, Bld: 127 mg/dL — ABNORMAL HIGH (ref 70–99)
Potassium: 3.9 mEq/L (ref 3.5–5.3)

## 2012-10-04 LAB — BRAIN NATRIURETIC PEPTIDE: Brain Natriuretic Peptide: 472.2 pg/mL — ABNORMAL HIGH (ref 0.0–100.0)

## 2012-10-07 ENCOUNTER — Telehealth: Payer: Self-pay | Admitting: Internal Medicine

## 2012-10-07 DIAGNOSIS — Z79899 Other long term (current) drug therapy: Secondary | ICD-10-CM

## 2012-10-07 DIAGNOSIS — R0602 Shortness of breath: Secondary | ICD-10-CM

## 2012-10-07 MED ORDER — METOLAZONE 5 MG PO TABS: 5.0000 mg | ORAL_TABLET | Freq: Every day | ORAL | Status: DC

## 2012-10-07 NOTE — Telephone Encounter (Signed)
Called patient with lab results and medication instructions per Dr. Rennis Golden - Lasix 80mg  BID and zaroxylyn 5mg  daily. Informed patient of repeat lab work and follow up appointment with Dr. Rennis Golden

## 2012-10-07 NOTE — Telephone Encounter (Signed)
Message copied by Lindell Spar on Fri Oct 07, 2012  5:03 PM ------      Message from: Chrystie Nose      Created: Fri Oct 07, 2012  1:42 PM       Let her know the BNP is elevated, but not significantly. This suggests some decompensated heart failure. Kidney function looks ok.  Have her decrease her lasix to 80 mg twice daily and add zaroxyln 5 mg daily. Re-check BMP/BNP in 2 weeks and appointment with me after that.            -[place] ------

## 2012-10-07 NOTE — Telephone Encounter (Signed)
Message forwarded to Dr. Hilty/Jenna, RN. 

## 2012-10-07 NOTE — Telephone Encounter (Signed)
Patient is waiting for her test results

## 2012-10-10 ENCOUNTER — Other Ambulatory Visit: Payer: Self-pay | Admitting: *Deleted

## 2012-10-10 MED ORDER — ALBUTEROL SULFATE (2.5 MG/3ML) 0.083% IN NEBU: 2.5000 mg | INHALATION_SOLUTION | Freq: Four times a day (QID) | RESPIRATORY_TRACT | Status: DC | PRN

## 2012-10-10 NOTE — Telephone Encounter (Signed)
Rx was sent to pharmacy electronically. 

## 2012-10-18 ENCOUNTER — Telehealth: Payer: Self-pay | Admitting: Internal Medicine

## 2012-10-18 NOTE — Telephone Encounter (Signed)
Please call refill to [facility] in [place] for her generic lasix.  Her dosage was changed by Dr. Rennis Golden and she is running out.

## 2012-10-24 ENCOUNTER — Other Ambulatory Visit: Payer: Self-pay | Admitting: *Deleted

## 2012-10-24 MED ORDER — METOPROLOL TARTRATE 50 MG PO TABS: 50.0000 mg | ORAL_TABLET | Freq: Three times a day (TID) | ORAL | Status: DC

## 2012-11-07 ENCOUNTER — Other Ambulatory Visit: Payer: Self-pay | Admitting: *Deleted

## 2012-11-07 ENCOUNTER — Telehealth (HOSPITAL_COMMUNITY): Payer: Self-pay | Admitting: Internal Medicine

## 2012-11-07 MED ORDER — CLONIDINE HCL 0.2 MG PO TABS: 0.2000 mg | ORAL_TABLET | Freq: Two times a day (BID) | ORAL | Status: DC

## 2012-11-07 NOTE — Telephone Encounter (Signed)
Rx was sent to pharmacy electronically. 

## 2012-11-07 NOTE — Telephone Encounter (Signed)
Needs refill on Clonidine.  Pharmacy has been faxing request since Thursday and told her to call us. [facility].

## 2012-11-08 ENCOUNTER — Ambulatory Visit: Payer: Medicaid Other | Admitting: Internal Medicine

## 2012-11-17 ENCOUNTER — Encounter: Payer: Self-pay | Admitting: Internal Medicine

## 2012-11-17 ENCOUNTER — Ambulatory Visit (INDEPENDENT_AMBULATORY_CARE_PROVIDER_SITE_OTHER): Payer: Medicaid Other | Admitting: Internal Medicine

## 2012-11-17 VITALS — BP 114/64 | HR 64 | Ht 70.5 in | Wt 290.6 lb

## 2012-11-17 DIAGNOSIS — I5043 Acute on chronic combined systolic (congestive) and diastolic (congestive) heart failure: Secondary | ICD-10-CM | POA: Insufficient documentation

## 2012-11-17 DIAGNOSIS — I428 Other cardiomyopathies: Secondary | ICD-10-CM

## 2012-11-17 DIAGNOSIS — I5023 Acute on chronic systolic (congestive) heart failure: Secondary | ICD-10-CM

## 2012-11-17 DIAGNOSIS — I509 Heart failure, unspecified: Secondary | ICD-10-CM

## 2012-11-17 DIAGNOSIS — Z95810 Presence of automatic (implantable) cardiac defibrillator: Secondary | ICD-10-CM

## 2012-11-17 LAB — ICD DEVICE OBSERVATION

## 2012-11-17 MED ORDER — POTASSIUM CHLORIDE CRYS ER 20 MEQ PO TBCR: 20.0000 meq | EXTENDED_RELEASE_TABLET | Freq: Every day | ORAL | Status: DC

## 2012-11-17 NOTE — Progress Notes (Signed)
OFFICE NOTE  Chief Complaint:  Increasing shortness of breath, orthopnea, weight gain  Primary Care Physician: Rush Barer, PA-C  HPI:  Katherine [name] is a 52 year old female with history of nonischemic cardiomyopathy, EF about 20% to 25%. She had heart catheterization which showed fairly normal coronaries and a cardiac MRI which showed a dilated LV and severe global hypokinesis and EF of 28%. No evidence of delayed enhancement, infiltrative disease, or LV noncompaction. She had an AICD placed which was a Barrister's clerk DR XT device. This was interrogated by Dr. Royann Shivers in July 2013. She had no events; however, did have some increased impedance with OptiVol. She did have a small amount of weight gain and he had increased her Lasix and started her on Aldactone. When I last saw her in April of this year her weight has been stable and she was asymptomatic. About 6 or 7 weeks ago she started to have more swelling and shortness of breath as well as weight gain. She also had a flare of her lupus and felt that her symptoms were possibly due to fibromyalgia. When her swelling became worse she increased her Lasix from 40 mg every morning and 20 mg every afternoon to 80 mg 3 times a day. She did not notify the office if this change and initially had some improvement in her swelling but notes that at some point she plateaued. She also feels like she gets occasional sharp twinges from her defibrillator which she thinks are shocks. She recently saw her primary care provider and has been having some problems with worsening kidney function per her report and was referred to a nephrologist who she will be seeing. I'm concerned about her increased lasix use and history of renal dysfunction.  At her last office visit I recommended adding Zaroxolyn daily to her regimen. She was also taking Lasix 80 mg 3 times daily. We decreased that to 40 mg twice a day. She has since had 11 pound weight loss. She reports  a marked improvement in her shortness of breath and symptoms. Her BNP was over 400 and is now down to 189 on laboratory work dated 11/15/2012. Her creatinine has risen very slightly from 1.1-1.21. Her labs otherwise are within normal limits except for potassium which is borderline low at 3.5. She is much more energy now and is generally feeling better. Her AICD was interrogated by myself today in the office to evaluate her operative all and heart failure management report. This demonstrated a sharp increase in her fluid status in the end of September and beginning of October which has now dropped back to 0. She's had no upper rate episodes or shocks noted since her last interrogation.  PMHx:  Past Medical History  Diagnosis Date  . Angina   . Asthma   . Shortness of breath   . Bronchitis   . Lupus   . Seizures     per patient statement  . Hypertension   . Stroke   . CHF (congestive heart failure)   . Pneumonia   . GERD (gastroesophageal reflux disease)   . Headache(784.0)   . Diabetes mellitus   . Fibromyalgia   . Nonischemic cardiomyopathy     NYHA classII  . Fibromyalgia 10/03/2012    Past Surgical History  Procedure Laterality Date  . Icd  02/13/2011    Medtronic Protecta DR XT  . Tubal ligation    . Right ankle    . US echocardiography  11/09/2010  mild LV enlargement w/conc. LVH & severe global hypokinesis EF 30-35%,mod. diastolic dysfunction, mod. TR,mild AI,mild to mod MR,mild PI    FAMHx:  History reviewed. No pertinent family history.  SOCHx:   reports that she quit smoking about 7 years ago. She has never used smokeless tobacco. She reports that she drinks alcohol. She reports that she does not use illicit drugs.  ALLERGIES:  Allergies  Allergen Reactions  . Penicillins Itching    Can take amoxicillin without a reaction  . Shellfish Allergy Other (See Comments)    Causes to have grand mal seizures    ROS: A comprehensive review of systems was  negative.  HOME MEDS: Current Outpatient Prescriptions  Medication Sig Dispense Refill  . albuterol (PROVENTIL HFA;VENTOLIN HFA) 108 (90 BASE) MCG/ACT inhaler Inhale 2 puffs into the lungs every 6 (six) hours as needed. For wheezing and shortness of breath      . albuterol (PROVENTIL) (2.5 MG/3ML) 0.083% nebulizer solution Take 3 mLs (2.5 mg total) by nebulization every 6 (six) hours as needed. For wheezing and shortness of breath  75 mL  3  . aspirin EC 81 MG tablet Take 81 mg by mouth daily.        . cloNIDine (CATAPRES) 0.2 MG tablet Take 1 tablet (0.2 mg total) by mouth 2 (two) times daily.  60 tablet  10  . clopidogrel (PLAVIX) 75 MG tablet Take 1 tablet (75 mg total) by mouth daily.  30 tablet  11  . Fluticasone-Salmeterol (ADVAIR) 250-50 MCG/DOSE AEPB Inhale 1 puff into the lungs every morning.        . furosemide (LASIX) 40 MG tablet Take 40 mg by mouth 2 (two) times daily. Will take 1 extra dose based on edema      . gabapentin (NEURONTIN) 300 MG capsule Take 300 mg by mouth 3 (three) times daily.        . hydroxychloroquine (PLAQUENIL) 200 MG tablet Take 200 mg by mouth 2 (two) times daily.        Marland Kitchen loratadine (CLARITIN) 10 MG tablet Take 10 mg by mouth daily as needed. For allergies       . LORazepam (ATIVAN) 1 MG tablet Take 0.5-1 mg by mouth daily as needed. anxiety       . losartan (COZAAR) 100 MG tablet Take 100 mg by mouth daily.      . metolazone (ZAROXOLYN) 5 MG tablet Take 5 mg by mouth every other day. Take Monday, Wednesday, Friday      . metoprolol (LOPRESSOR) 50 MG tablet Take 1 tablet (50 mg total) by mouth 3 (three) times daily.  90 tablet  11  . omeprazole (PRILOSEC) 20 MG capsule Take 20 mg by mouth 2 (two) times daily.        . promethazine (PHENERGAN) 25 MG tablet Take 25 mg by mouth every 8 (eight) hours as needed. nausea       . simvastatin (ZOCOR) 20 MG tablet Take 20 mg by mouth at bedtime.        . sitaGLIPtin (JANUVIA) 50 MG tablet Take 50 mg by mouth daily.       Marland Kitchen spironolactone (ALDACTONE) 25 MG tablet Take 1 tablet (25 mg total) by mouth daily.  90 tablet  2  . venlafaxine (EFFEXOR-XR) 75 MG 24 hr capsule Take 450 mg by mouth daily.       Marland Kitchen zolpidem (AMBIEN) 10 MG tablet Take 10 mg by mouth at bedtime as needed for sleep.      Marland Kitchen  potassium chloride SA (K-DUR,KLOR-CON) 20 MEQ tablet Take 1 tablet (20 mEq total) by mouth daily.  90 tablet  3   No current facility-administered medications for this visit.    LABS/IMAGING: No results found for this or any previous visit (from the past 48 hour(s)). No results found.  VITALS: BP 114/64  Pulse 64  Ht 5' 10.5" (1.791 m)  Wt 290 lb 9.6 oz (131.815 kg)  BMI 41.09 kg/m2  EXAM: General appearance: alert, mild distress and morbidly obese Neck: no notable JVD, no adenopathy, no carotid bruit Lungs: diminished breath sounds bibasilar, no rales Heart: regular rate and rhythm Abdomen: obese, distended and somewhat firm, no rebound, TTP or guarding Extremities: no edema Pulses: 2+ and symmetric Skin: Skin color, texture, turgor normal. No rashes or lesions Neurologic: Grossly normal  EKG: Normal sinus rhythm at 74  ASSESSMENT: 1. Acute on chronic systolic heart failure exacerbation - improved with the addition of Zaroxyln 2. Nonischemic cardiomyopathy, EF 28% 2013 by cardiac MRI 3. Status post Medtronic Protecta XT AICD 4. Reported renal dysfunction  PLAN: 1.   Mrs. Mini is doing much better after the addition of Zaroxolyn to Lasix. She continues on Zaroxolyn daily with Lasix 40 mg twice daily. Her BNP has come down significantly to 189. Her creatinine is stable at 1.21. She feels significantly better with less shortness of breath and no lower extremity swelling. Her weight is coming down 11 pounds. Her Optivol measurements indicate that she is now at 0 or baseline based on fluid index.  Her potassium is noted to be on the low normal side. I would like to start her on potassium 40 mEq daily. In  addition I think we can back off on the Zaroxolyn to 3 times weekly. She should remain on 40 mg of Lasix twice daily. We will go and try to configure her to have monthly reports of her Optivol to help pinpoint trends in heart failure.  A plan to see her back in 3 months or sooner as necessary.  Chrystie Nose, MD, [facility] Attending Cardiologist [facility]  HILTY,Kenneth C 11/17/2012, 3:37 PM

## 2012-11-17 NOTE — Patient Instructions (Addendum)
Start taking a potassium supplement daily. This has been sent to your pharmacy.  Take your zaroxolyn on Mondays, Wednesdays & Fridays.   Your physician wants you to follow-up in: 3 months. You will receive a reminder letter in the mail two months in advance. If you don't receive a letter, please call our office to schedule the follow-up appointment.

## 2012-12-04 IMAGING — CR DG CHEST 2V
2 series · 2 of 2 positions shown · non-contrast
Comparison: None.

CLINICAL DATA: Pacemaker placement.  History of diabetes and
hypertension.

CHEST - 2 VIEW

[w chest pa]
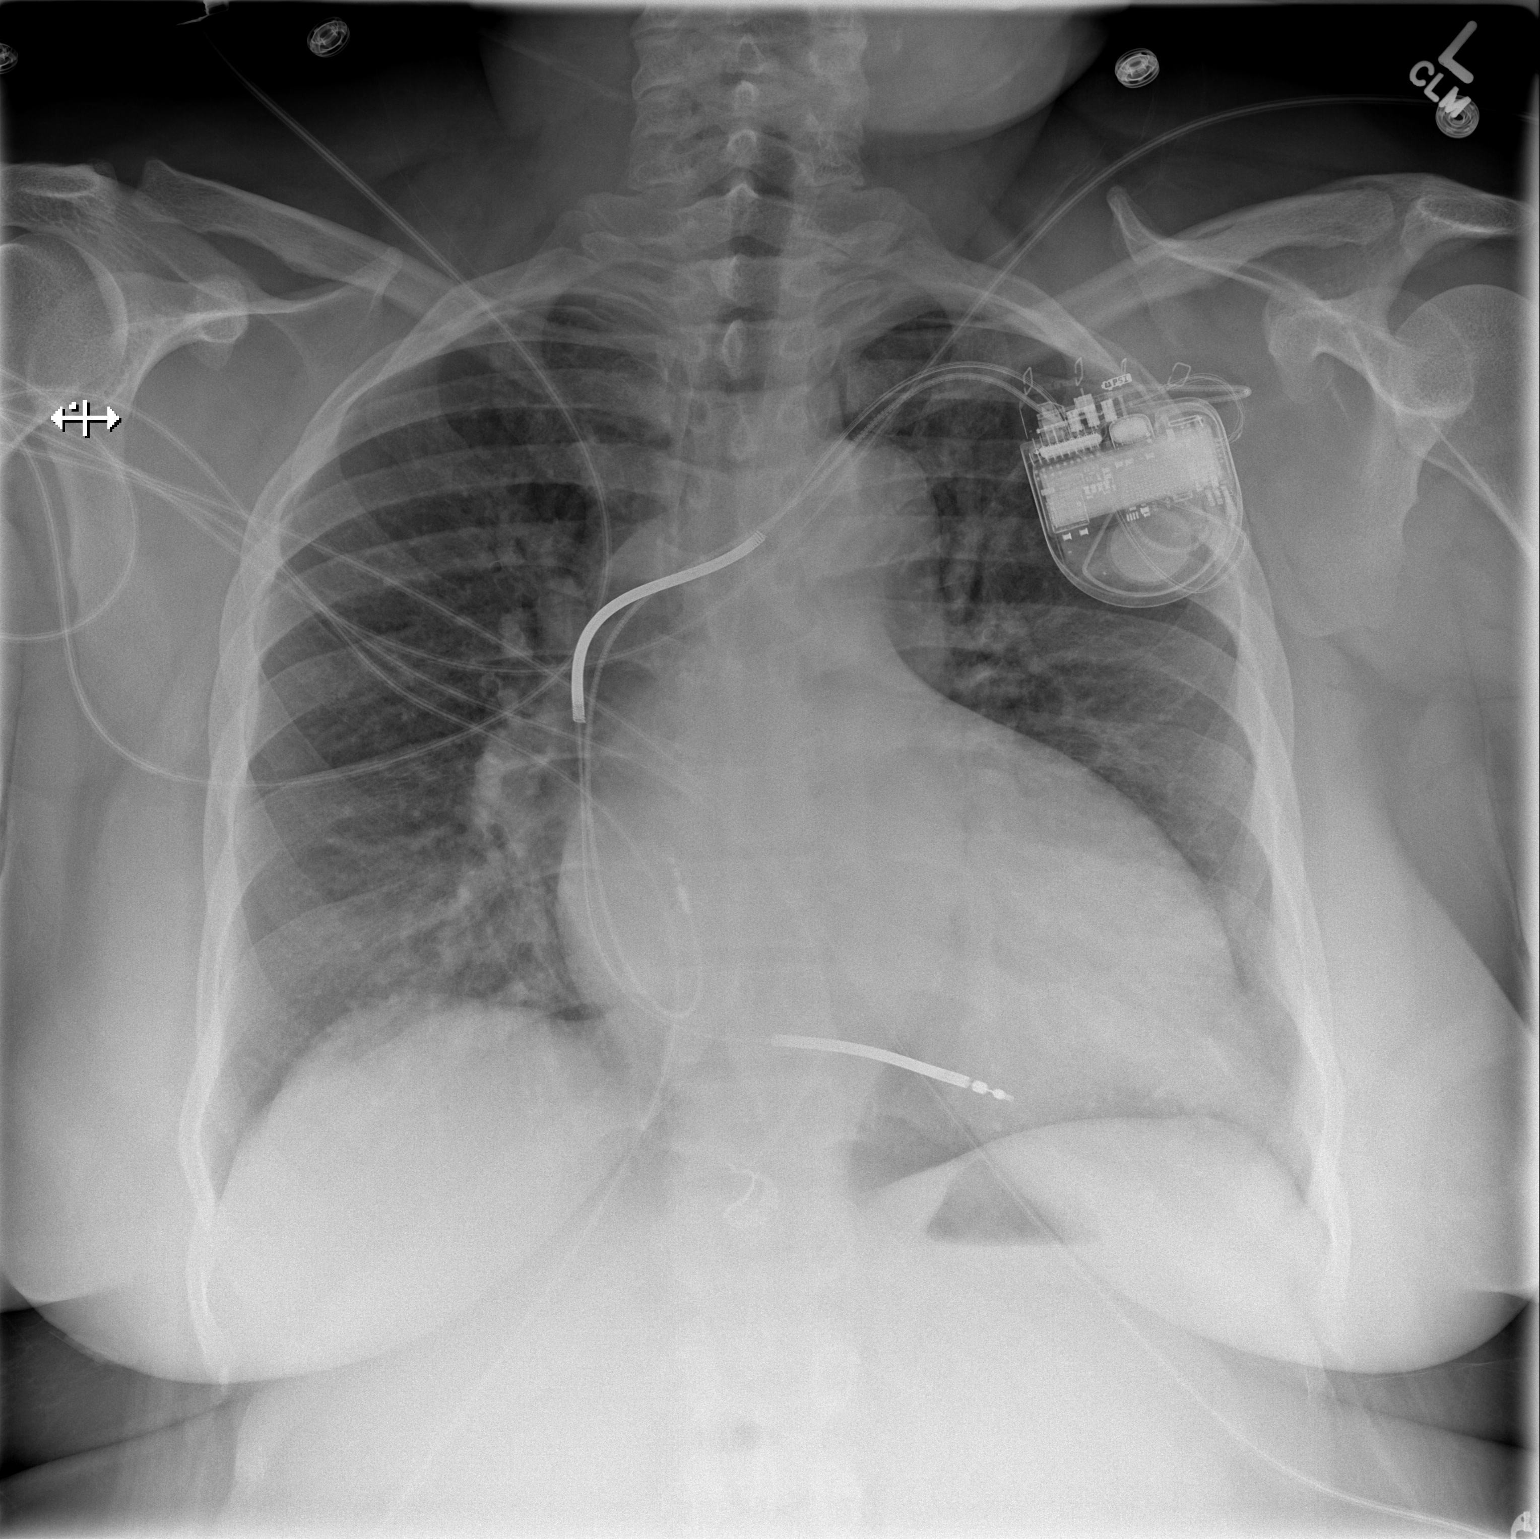

[w chest lat]
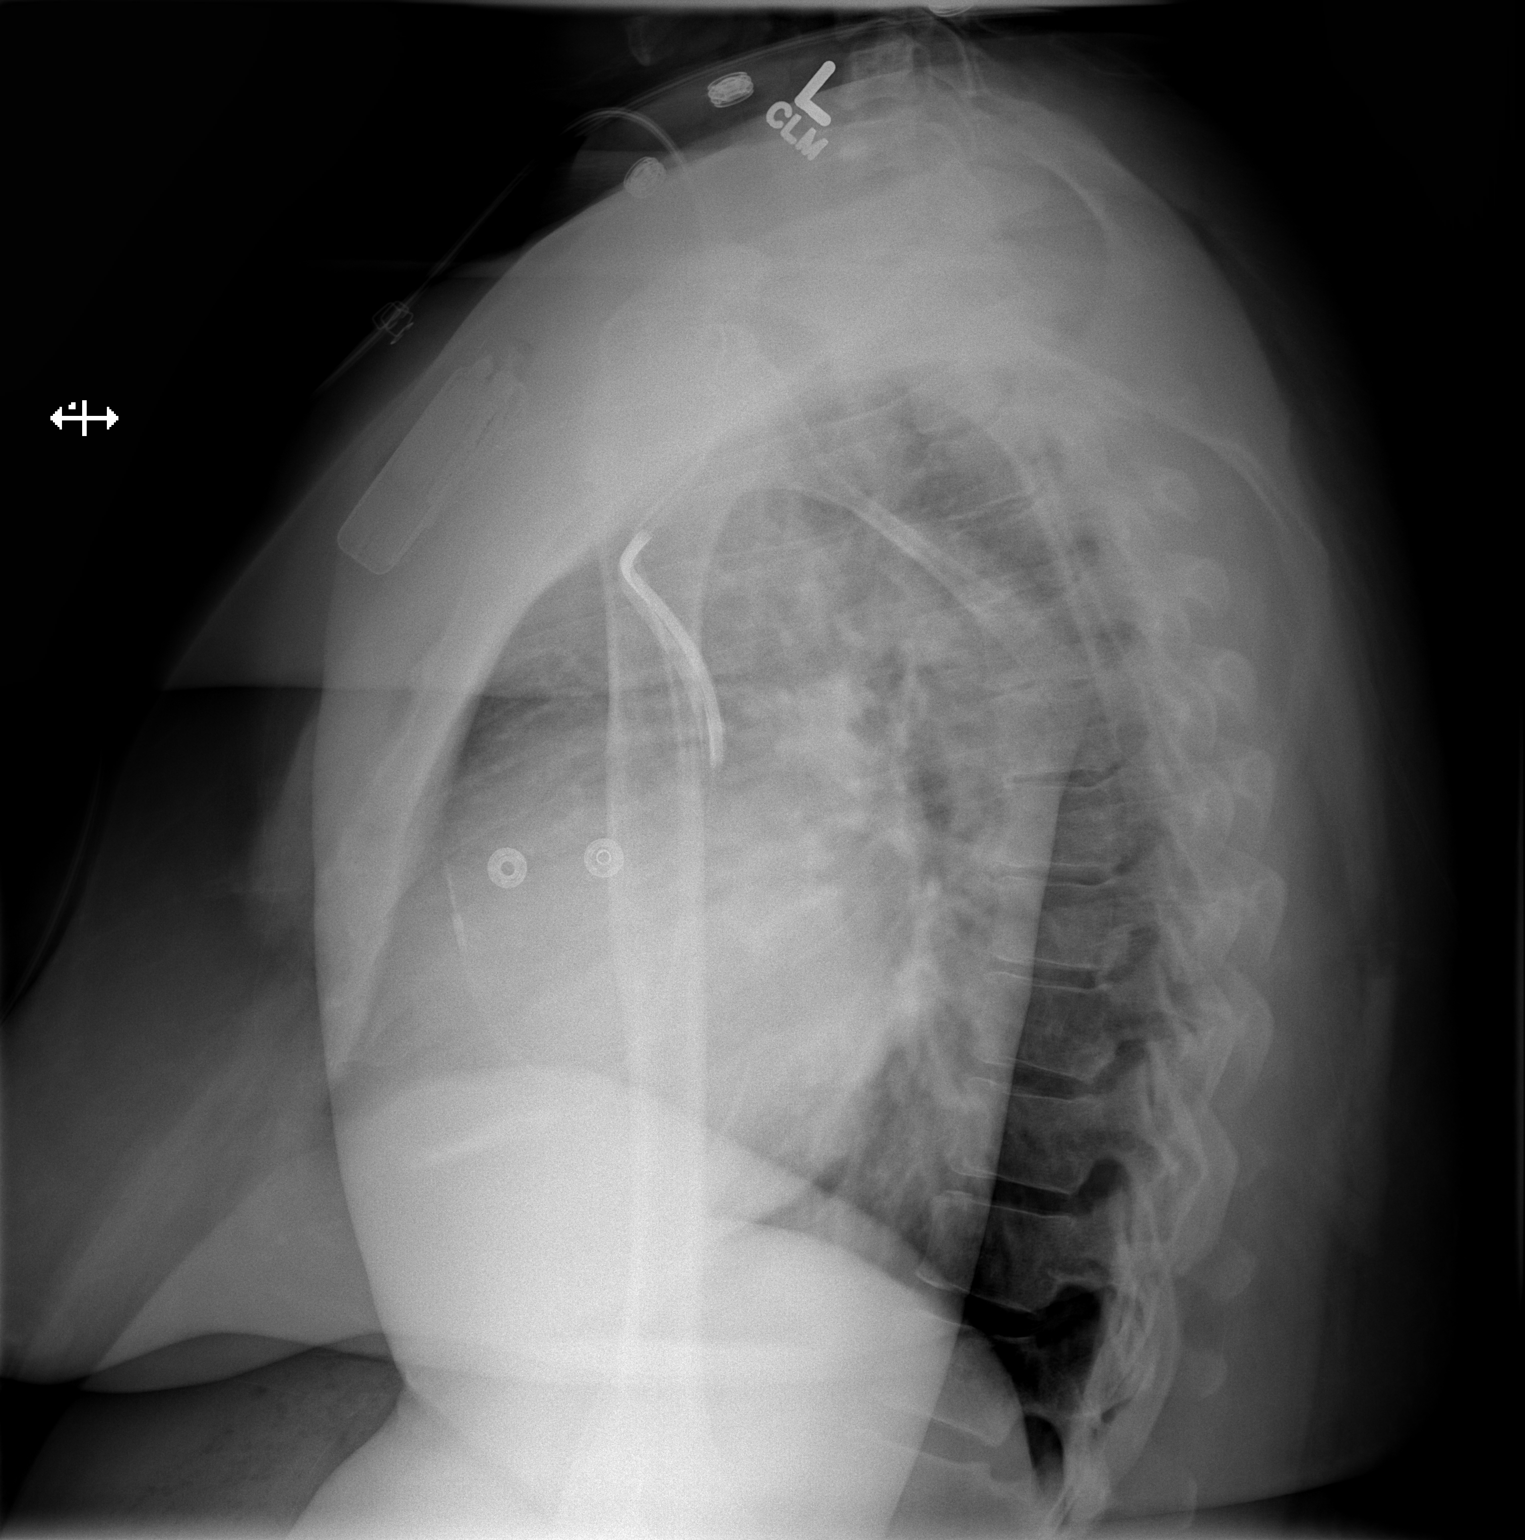

[2 of 2 positions shown; findings below may reference images not displayed]

FINDINGS: The patient was unable to raise left arm for the lateral
view.  There is a left subclavian AICD with leads in the right
atrium and right ventricle.  There is moderate cardiomegaly with
mild vascular congestion.  No overt pulmonary edema, significant
pleural effusion or pneumothorax is demonstrated.
IMPRESSION: Pacemaker placement without demonstrated complication.
Cardiomegaly and vascular congestion.

## 2013-01-02 ENCOUNTER — Encounter: Payer: Medicaid Other | Admitting: *Deleted

## 2013-01-11 ENCOUNTER — Ambulatory Visit (INDEPENDENT_AMBULATORY_CARE_PROVIDER_SITE_OTHER): Payer: Medicaid Other | Admitting: *Deleted

## 2013-01-11 DIAGNOSIS — I428 Other cardiomyopathies: Secondary | ICD-10-CM

## 2013-01-11 DIAGNOSIS — I509 Heart failure, unspecified: Secondary | ICD-10-CM

## 2013-01-11 DIAGNOSIS — I5023 Acute on chronic systolic (congestive) heart failure: Secondary | ICD-10-CM

## 2013-01-11 LAB — ICD DEVICE OBSERVATION

## 2013-01-11 NOTE — Patient Instructions (Signed)
Please go home and set up your Carelink transmitter.   Your physician recommends that you schedule a follow-up appointment in: 6 months with Dr.Croitoru

## 2013-01-13 LAB — MDC_IDC_ENUM_SESS_TYPE_INCLINIC
Battery Voltage: 3.13 V
Brady Statistic AP VP Percent: 0.1 % — CL
Brady Statistic AS VP Percent: 0.1 % — CL
HighPow Impedance: 44 Ohm
HighPow Impedance: 55 Ohm
Lead Channel Impedance Value: 399 Ohm
Lead Channel Impedance Value: 456 Ohm
Lead Channel Pacing Threshold Amplitude: 0.75 V
Lead Channel Pacing Threshold Pulse Width: 0.4 ms
Lead Channel Sensing Intrinsic Amplitude: 3.3 mV
Lead Channel Setting Pacing Amplitude: 1.5 V
Lead Channel Setting Pacing Amplitude: 2 V
Lead Channel Setting Sensing Sensitivity: 0.6 mV
Zone Setting Detection Interval: 359.2 ms

## 2013-01-13 NOTE — Progress Notes (Signed)
ICD check in clinic. Normal device function. Thresholds and sensing consistent with previous device measurements. Impedance trends stable over time. No evidence of any ventricular arrhythmias. No mode switches. Histogram distribution appropriate for patient and level of activity. No changes made this session. Device programmed at appropriate safety margins. Device programmed to optimize intrinsic conduction. Current batt voltage 3.13V (ERI 2.63V). Pt enrolled in remote follow-up. Plan to follow up remotely on 02-13-2013 and with [facility] in March 2015.  Patient education completed including shock plan. Alert tones demonstrated for patient.

## 2013-01-16 ENCOUNTER — Telehealth: Payer: Self-pay | Admitting: *Deleted

## 2013-01-23 ENCOUNTER — Other Ambulatory Visit: Payer: Self-pay | Admitting: *Deleted

## 2013-01-23 MED ORDER — FUROSEMIDE 40 MG PO TABS: 40.0000 mg | ORAL_TABLET | Freq: Two times a day (BID) | ORAL | Status: DC

## 2013-02-13 ENCOUNTER — Encounter: Payer: Medicaid Other | Admitting: *Deleted

## 2013-02-24 ENCOUNTER — Encounter: Payer: Self-pay | Admitting: *Deleted

## 2013-04-06 ENCOUNTER — Encounter: Payer: Self-pay | Admitting: *Deleted

## 2013-05-22 ENCOUNTER — Encounter: Payer: Self-pay | Admitting: Cardiovascular Disease

## 2013-05-25 ENCOUNTER — Other Ambulatory Visit: Payer: Self-pay

## 2013-05-25 MED ORDER — METOLAZONE 5 MG PO TABS: 5.0000 mg | ORAL_TABLET | ORAL | Status: DC

## 2013-06-20 ENCOUNTER — Encounter: Payer: Self-pay | Admitting: Cardiology

## 2013-06-20 ENCOUNTER — Ambulatory Visit: Payer: Medicaid Other | Admitting: Internal Medicine

## 2013-07-20 ENCOUNTER — Ambulatory Visit: Payer: Medicaid Other | Admitting: Internal Medicine

## 2013-08-08 ENCOUNTER — Other Ambulatory Visit: Payer: Self-pay

## 2013-08-08 MED ORDER — METOLAZONE 5 MG PO TABS: 5.0000 mg | ORAL_TABLET | ORAL | Status: DC

## 2013-08-08 NOTE — Telephone Encounter (Signed)
Rx was sent to pharmacy electronically. 

## 2013-08-12 ENCOUNTER — Telehealth: Payer: Self-pay | Admitting: Internal Medicine

## 2013-08-12 NOTE — Telephone Encounter (Signed)
Closed encounter °

## 2013-08-22 ENCOUNTER — Telehealth: Payer: Self-pay | Admitting: Internal Medicine

## 2013-08-24 NOTE — Telephone Encounter (Signed)
Closed enounter °

## 2013-08-29 ENCOUNTER — Encounter: Payer: Self-pay | Admitting: *Deleted

## 2013-09-12 ENCOUNTER — Encounter: Payer: Self-pay | Admitting: *Deleted

## 2013-10-06 ENCOUNTER — Encounter: Payer: Medicaid Other | Attending: "Endocrinology | Admitting: Nutrition

## 2013-10-06 VITALS — Ht 70.0 in | Wt 288.0 lb

## 2013-10-06 DIAGNOSIS — E118 Type 2 diabetes mellitus with unspecified complications: Principal | ICD-10-CM

## 2013-10-06 DIAGNOSIS — E119 Type 2 diabetes mellitus without complications: Secondary | ICD-10-CM

## 2013-10-06 DIAGNOSIS — E1165 Type 2 diabetes mellitus with hyperglycemia: Secondary | ICD-10-CM | POA: Insufficient documentation

## 2013-10-06 DIAGNOSIS — IMO0002 Reserved for concepts with insufficient information to code with codable children: Secondary | ICD-10-CM | POA: Insufficient documentation

## 2013-10-06 NOTE — Progress Notes (Signed)
  Medical Nutrition Therapy:  Appt start time: 1000 end time:  1100.   Assessment:  Primary concerns today: uncontrolled diabetes. She notes she has a lot of health problems that make it hard for her to exercise, eat on regular schedule and take care of her diabetes. She reports recently coming out of hospital with blood sugars in the 900's. She states she has Lupus, has had 5 mini strokes (?), Fibromyalgia, CHF, IBS and some other medical problems. She lives by herself and cooks/shops for herself. Denies any chewing or swallowing problems. Can't walk well due to history of stroke and right side weakness at times. She notes her memory is not good at times.Quit smoking and now has quit drinking regular sodas. Hasn't been checking blood sugars because she has anxiety attacks. Possible non compliance with insulin due  Anxiety issues.  Preferred Learning Style:   Auditory  Visual  Hands on  Learning Readiness:   Contemplating  MEDICATIONS: See list   DIETARY INTAKE:  It was difficult to get a diet history from her today. She notes she can't get up too early due to pain from fibromyalgia and her lupus. Incosistent meal times. Usual physical activity: limited but she is willing to try to improve and be more active when able.  Estimated energy needs: 1500 calories 180 g carbohydrates 80  g protein 45 g fat  Progress Towards Goal(s):  In progress.   Nutritional Diagnosis:  NB-1.1 Food and nutrition-related knowledge deficit As related to uncontrolled diabetes.  As evidenced by A1C > 7% with CKD..    Intervention:  Nutrition counseling and diabetes education on diet, meal planning, MY Plate, portion sizes and benefits of physical activity for weight loss and improved blood sugars.  Plan:  Aim for 3-4 Carb Choices per meal ( 45-60 grams) +/- 1 either way  Aim for 0-1 Carbs per snack if hungry  Include protein in moderation with your meals and snacks Consider  increasing your activity  level by 15 for 30 minutes daily as tolerated Consider checking BG at alternate times per day as directed by MD  Continuer taking medications as directed by MD Follow the Plate Method  .Teaching Method Utilized:  Visual Auditory Hands on . Handouts given during visit include: Carb Counting and Food Label handouts Meal Plan Card  My Plate Method  Barriers to learning/adherence to lifestyle change: memory issues, physical limitations from Lupus  Demonstrated degree of understanding via:  Teach Back   Monitoring/Evaluation:  Dietary intake, exercise, meal planning, SBG, and body weight in 1 week(s).

## 2013-10-06 NOTE — Patient Instructions (Signed)
Plan:  Aim for 3-4 Carb Choices per meal ( 45-60 grams) +/- 1 either way  Aim for 0-1 Carbs per snack if hungry  Include protein in moderation with your meals and snacks Consider  increasing your activity level by 15 for 30 minutes daily as tolerated Consider checking BG at alternate times per day as directed by MD  Continuer taking medications as directed by MD Follow the Plate Method

## 2013-10-09 ENCOUNTER — Telehealth: Payer: Self-pay | Admitting: Nutrition

## 2013-10-11 ENCOUNTER — Encounter: Payer: Medicaid Other | Attending: "Endocrinology | Admitting: Nutrition

## 2013-10-11 ENCOUNTER — Ambulatory Visit: Payer: Medicaid Other | Admitting: Nutrition

## 2013-10-11 ENCOUNTER — Encounter: Payer: Self-pay | Admitting: Cardiovascular Disease

## 2013-10-11 ENCOUNTER — Ambulatory Visit (INDEPENDENT_AMBULATORY_CARE_PROVIDER_SITE_OTHER): Payer: Medicaid Other | Admitting: Cardiovascular Disease

## 2013-10-11 VITALS — BP 100/62 | HR 68 | Ht 71.0 in | Wt 288.0 lb

## 2013-10-11 DIAGNOSIS — E119 Type 2 diabetes mellitus without complications: Secondary | ICD-10-CM

## 2013-10-11 DIAGNOSIS — Z95810 Presence of automatic (implantable) cardiac defibrillator: Secondary | ICD-10-CM

## 2013-10-11 DIAGNOSIS — I10 Essential (primary) hypertension: Secondary | ICD-10-CM

## 2013-10-11 DIAGNOSIS — I6350 Cerebral infarction due to unspecified occlusion or stenosis of unspecified cerebral artery: Secondary | ICD-10-CM

## 2013-10-11 DIAGNOSIS — I5022 Chronic systolic (congestive) heart failure: Secondary | ICD-10-CM

## 2013-10-11 DIAGNOSIS — E1165 Type 2 diabetes mellitus with hyperglycemia: Secondary | ICD-10-CM

## 2013-10-11 DIAGNOSIS — I428 Other cardiomyopathies: Secondary | ICD-10-CM

## 2013-10-11 DIAGNOSIS — IMO0002 Reserved for concepts with insufficient information to code with codable children: Secondary | ICD-10-CM | POA: Diagnosis not present

## 2013-10-11 DIAGNOSIS — E785 Hyperlipidemia, unspecified: Secondary | ICD-10-CM

## 2013-10-11 DIAGNOSIS — I639 Cerebral infarction, unspecified: Secondary | ICD-10-CM

## 2013-10-11 MED ORDER — METOPROLOL SUCCINATE ER 100 MG PO TB24: 100.0000 mg | ORAL_TABLET | Freq: Every day | ORAL | Status: DC

## 2013-10-11 NOTE — Progress Notes (Signed)
  Medical Nutrition Therapy:  Appt start time: 1530 end time:  1630.   Assessment:  Primary concerns today: Diabetes.  She lives by herself and does her own shopping and cooking. She reports her blood sugars still in the 300's.  Medication compliance is reported to be an issue. Stays hungry, thirsty, tired and feels depressed at times.  Preferred Learning Style:   Auditory  No preference indicated   Learning Readiness:   Not ready  Contemplating  Ready  Change in progress   MEDICATIONS: see list   DIETARY INTAKE:  Her eating pattern is inconsistent. She notes she she eats at various times and no schedule. Sometimes skips meals.  Usual physical activity: ADL's  Estimated energy needs: 1800  calories 200 g carbohydrates 135 g protein 50 g fat  Progress Towards Goal(s):  In progress.   Nutritional Diagnosis:  NB-1.1 Food and nutrition-related knowledge deficit As related to diabetes.  As evidenced by A1C A1C > 15%   Intervention:  Nutrition counseling and diabetes education provided on diet, portion sizes, meal planning, carb counting and importance of diet and medication compliance to reduce complications.. Plan:  Aim for 3-4 Carb Choices per meal (45-60 grams) +/- 1 either way  Aim for 0-1 Carbs per snack if hungry  Include protein in moderation with your meals and snacks Consider  increasing your activity level by 15 for 30 minutes daily as tolerated Check BG 4 times per day as instructed by MD and record on BS log sheet along with medication administered.  Consider taking medications as directed by MD.  Goal 1. Get A1C down to less than 10% in three months. 2. Eat three balanced meals as discussed by Plate Method. 3. Drink water and avoid sweets and junk food. 4. Do not snack between meals.  Teaching Method Utilized:  Visual Auditory Hands on  Handouts given during visit include:  My Plate Carb Counting and Food Label handouts Meal Plan  Card    Barriers to learning/adherence to lifestyle change: fiances and level of understanding  Demonstrated degree of understanding via:  Teach Back   Monitoring/Evaluation:  Dietary intake, exercise, meal planning, food journal, SBG, and body weight in 1 month(s).

## 2013-10-11 NOTE — Patient Instructions (Addendum)
Your physician wants you to follow-up in: 6 months You will receive a reminder letter in the mail two months in advance. If you don't receive a letter, please call our office to schedule the follow-up appointment.    Your physician has recommended you make the following change in your medication:     Change from Lopressor to Torprol XL 100 mg daily     Thank you for choosing [facility] !

## 2013-10-11 NOTE — Progress Notes (Signed)
  Medical Nutrition Therapy:  Appt start time: 1545end time:  1600.   Assessment:  Primary concerns today: Diabetes.  BS this 343  mg/dl. 15 units Humalog with meals; 50 units of levemir.  BS been running in upper 353 mg at lunch 430 yesterday am. 317 before supper last night Following the slidding scale as directed..  Lives by herself and shops for herself. Vision is  gettng better. Now preparing meals before injecting insulin.  Taking a One a day multivitamin a day. Walking dog for exercise some days. Drinking water only. Eating carrots and lettuce for her vegetable and beginning to feel better.  Preferred Learning Style:   Auditory  Visual  Hands on   Learning Readiness:   Not ready  Contemplating  Ready  Change in progress   MEDICATIONS: see list   DIETARY INTAKE:  24-hr recall:  B ( AM): 2 toast, 2 eggs and 2 slices of bacon and grils or oatmeal 1 pk, water 16 oz Snk ( AM): yogurt  L ( PM): bologna,lettuce and cheese sandwich on honewheat, carrots, apple, water 16 oz Snk ( PM): none D ( PM): Cheese toast sandwich, water 16 oz Snk ( PM): none Beverages: water 1 liter per day  Usual physical activity: Walking her dog daily  Estimated energy needs: 1500 calories 170 g carbohydrates 1112 g protein 42 g fat  Progress Towards Goal(s):  In progress.   Nutritional Diagnosis:  NB-1.1 Food and nutrition-related knowledge deficit As related to diabetes.  As evidenced by A1C >15%.    Intervention:  Nutrition counseling and diabetes education.  Plan:  Aim for 3-4 Carb Choices per meal (1 grams) +/- 1 either way  Keep a blood sugars log and food journal daily. Avoid snacks unless a low blood sugar. Include protein in moderation with your meals and snacks Consider reading food labels for Total Carbohydrate Consider  increasing your activity level by 15 for 30 minutes daily as tolerated Consider checking BG at alternate times per day as directed by MD  Consider taking  medication  as directed by MD- Levemir 50 units nightly. Do not forget to take it. May take with dinner meal to help remember to take it. Be sure to take Novolog 15 units (meal time insulin) before each meal but don't inject until food is on the table. Test blood sugar before meals to know how much insulin to inject before meals.  Teaching Method Utilized:  Visual Auditory Hands on  Handouts given during visit include:       My Plate  Carb Counting and Food Label handouts Meal Plan Card  Barriers to learning/adherence to lifestyle change: none  Demonstrated degree of understanding via:  Teach Back   Monitoring/Evaluation:  Dietary intake, exercise, meal planning, SBG, My Plate,  and body weight in 2 week(s).. Bring BS log and food journal with you to visits.

## 2013-10-11 NOTE — Progress Notes (Signed)
Patient ID: Randa Spike, female   DOB: [birth year], 53 y.o.   MRN: [id number]      SUBJECTIVE: The patient is a 53 year old woman who I am evaluating for the first time, and is a former patient of Dr. Rennis Golden. She has a history of a nonischemic cardiomyopathy and chronic systolic heart failure and has an ICD. She also has a history of HTN, stroke and type 2 diabetes mellitus. Cardiac MRI on 12/25/10 showed moderate LV dilation with severe global hypokinesis, EF 28%. There was no myocardial delayed enhancement, thus no definitive evidence for prior MI, infiltrative disease, or myocarditis. She denies chest pain and palpitations. She may explain some shortness of breath and waking up in the morning which resolves after getting out of bed. She denies paroxysmal nocturnal dyspnea. She takes metolazone every other day. On the day she doesn't take it she can experience some abdominal distention.  ECG performed in the office today demonstrates normal sinus rhythm with left axis deviation and a nonspecific T wave abnormality.  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  Review of Systems: As per "subjective", otherwise negative.  Allergies  Allergen Reactions  . Penicillins Itching    Can take amoxicillin without a reaction  . Shellfish Allergy Other (See Comments)    Causes to have grand mal seizures    Current Outpatient Prescriptions  Medication Sig Dispense Refill  . acetaminophen (TYLENOL) 500 MG tablet Take 500 mg by mouth every 6 (six) hours as needed.      Marland Kitchen albuterol (PROVENTIL HFA;VENTOLIN HFA) 108 (90 BASE) MCG/ACT inhaler Inhale 2 puffs into the lungs every 6 (six) hours as needed. For wheezing and shortness of breath      . albuterol (PROVENTIL) (2.5 MG/3ML) 0.083% nebulizer solution Take 3 mLs (2.5 mg total) by nebulization every 6 (six) hours as needed. For wheezing and shortness of breath  75 mL  3  . aspirin EC 81 MG tablet Take 81 mg by mouth daily.        .  cloNIDine (CATAPRES) 0.2 MG tablet Take 1 tablet (0.2 mg total) by mouth 2 (two) times daily.  60 tablet  10  . clopidogrel (PLAVIX) 75 MG tablet Take 1 tablet (75 mg total) by mouth daily.  30 tablet  11  . Fluticasone-Salmeterol (ADVAIR) 250-50 MCG/DOSE AEPB Inhale 1 puff into the lungs every morning.        . furosemide (LASIX) 40 MG tablet Take 1 tablet (40 mg total) by mouth 2 (two) times daily. Will take 1 extra dose based on edema  90 tablet  2  . gabapentin (NEURONTIN) 300 MG capsule Take 300 mg by mouth 3 (three) times daily.        . hydroxychloroquine (PLAQUENIL) 200 MG tablet Take 200 mg by mouth 2 (two) times daily.        Marland Kitchen ibuprofen (ADVIL,MOTRIN) 400 MG tablet Take 400 mg by mouth every 8 (eight) hours as needed.      . Insulin Aspart (NOVOLOG FLEXPEN [place]) Inject 50 Units into the skin.      Marland Kitchen loratadine (CLARITIN) 10 MG tablet Take 10 mg by mouth daily as needed. For allergies       . LORazepam (ATIVAN) 1 MG tablet Take 0.5-1 mg by mouth daily as needed. anxiety       . losartan (COZAAR) 100 MG tablet Take 100 mg by mouth daily.      . metolazone (ZAROXOLYN) 5 MG tablet Take 1 tablet (  5 mg total) by mouth every other day. Take Monday, Wednesday, Friday  15 tablet  3  . metoprolol (LOPRESSOR) 50 MG tablet Take 1 tablet (50 mg total) by mouth 3 (three) times daily.  90 tablet  11  . omeprazole (PRILOSEC) 20 MG capsule Take 20 mg by mouth 2 (two) times daily.        . potassium chloride SA (K-DUR,KLOR-CON) 20 MEQ tablet Take 1 tablet (20 mEq total) by mouth daily.  90 tablet  3  . promethazine (PHENERGAN) 25 MG tablet Take 25 mg by mouth every 8 (eight) hours as needed. nausea       . simvastatin (ZOCOR) 20 MG tablet Take 20 mg by mouth at bedtime.        Marland Kitchen spironolactone (ALDACTONE) 25 MG tablet Take 1 tablet (25 mg total) by mouth daily.  90 tablet  2  . traMADol (ULTRAM) 50 MG tablet Take by mouth 2 (two) times daily as needed.      . venlafaxine XR (EFFEXOR-XR) 150 MG 24 hr  capsule Take 450 mg by mouth daily.       No current facility-administered medications for this visit.    Past Medical History  Diagnosis Date  . Angina   . Asthma   . Shortness of breath   . Bronchitis   . Lupus   . Seizures     per patient statement  . Hypertension   . Stroke   . CHF (congestive heart failure)   . Pneumonia   . GERD (gastroesophageal reflux disease)   . Headache(784.0)   . Diabetes mellitus   . Fibromyalgia   . Nonischemic cardiomyopathy     NYHA classII  . Fibromyalgia 10/03/2012    Past Surgical History  Procedure Laterality Date  . Icd  02/13/2011    Medtronic Protecta DR XT  . Tubal ligation    . Right ankle    . US echocardiography  11/09/2010    mild LV enlargement w/conc. LVH & severe global hypokinesis EF 30-35%,mod. diastolic dysfunction, mod. TR,mild AI,mild to mod MR,mild PI    History   Social History  . Marital Status: Legally Separated    Spouse Name: N/A    Number of Children: N/A  . Years of Education: N/A   Occupational History  . Not on file.   Social History Main Topics  . Smoking status: Former Smoker -- 1.00 packs/day    Types: Cigarettes    Quit date: 10/03/2005  . Smokeless tobacco: Never Used  . Alcohol Use: Yes     Comment: quit 2007  . Drug Use: No  . Sexual Activity: Yes   Other Topics Concern  . Not on file   Social History Narrative  . No narrative on file     Filed Vitals:   10/11/13 1332  BP: 100/62  Pulse: 68  Height:  (1.803 m)  Weight: 288 lb (130.636 kg)    PHYSICAL EXAM General: NAD HEENT: Normal. Neck: No JVD, no thyromegaly. Lungs: Clear to auscultation bilaterally with normal respiratory effort. CV: Nondisplaced PMI.  Regular rate and rhythm, normal S1/S2, no S3/S4, no murmur. No pretibial or periankle edema.  No carotid bruit.  Normal pedal pulses.  Abdomen: Soft, nontender, no hepatosplenomegaly, no distention.  Neurologic: Alert and oriented x 3.  Psych: Normal  affect. Skin: Normal. Musculoskeletal: Normal range of motion, no gross deformities. Extremities: No clubbing or cyanosis.   ECG: Most recent ECG reviewed.  ASSESSMENT AND PLAN: 1. Chronic systolic heart failure/NICM: Euvolemic and stable. Continue therapy with Lasix 40 mg twice daily, spironolactone 25 mg daily and metolazone 5 mg every other day. Renal function is monitored by primary provider. Currently on Toprol tartrate 50 mg three times daily. Given her severely reduced left ventricular systolic function, I will switch this to Toprol-XL 100 mg daily. 2. ICD: Device interrogation in December 2014 demonstrated normal function with no ventricular arrhythmias and no mode switches. Scheduled to see [name] next month. 3. Essential HTN: Controlled on present therapy. 4. Type 2 diabetes:On insulin. 5. Hyperlipidemia: On simvastatin 20 mg.  Dispo: f/u 6 months.  Prentice Docker, M.D., F.A.C.C.

## 2013-10-12 ENCOUNTER — Telehealth: Payer: Self-pay | Admitting: Cardiovascular Disease

## 2013-10-12 NOTE — Telephone Encounter (Signed)
Please see refill bin for prior authorization / tgs  °

## 2013-10-13 ENCOUNTER — Encounter: Payer: Self-pay | Admitting: Nutrition

## 2013-10-17 ENCOUNTER — Telehealth: Payer: Self-pay | Admitting: *Deleted

## 2013-10-17 NOTE — Telephone Encounter (Signed)
Insurance will cover Toprol XL 100 mg daily, notified [facility] pharmacy that this medication does not need a prior authorization. Pharmacist filled medication under the trade name.

## 2013-10-18 NOTE — Patient Instructions (Signed)
Plan:  Aim for 3-4 Carb Choices per meal (45-60 grams) +/- 1 either way  Aim for 0-1 Carbs per snack if hungry  Include protein in moderation with your meals and snacks Consider  increasing your activity level by 15 for 30 minutes daily as tolerated Check BG 4 times per day as instructed by MD and record on BS log sheet along with medication administered.  Consider taking medications as directed by MD.  Goal 1. Get A1C down to less than 10% in three months. 2. Eat three balanced meals as discussed by Plate Method. 3. Drink water and avoid sweets and junk food. 4. Do not snack between meals.

## 2013-10-20 ENCOUNTER — Encounter: Payer: Medicaid Other | Admitting: Internal Medicine

## 2013-11-01 NOTE — Telephone Encounter (Signed)
Missed appointment

## 2013-11-08 NOTE — Patient Instructions (Signed)
Plan:  Aim for 3-4 Carb Choices per meal (1 grams) +/- 1 either way  Keep a blood sugars log and food journal daily. Avoid snacks unless a low blood sugar. Include protein in moderation with your meals and snacks Consider reading food labels for Total Carbohydrate Consider  increasing your activity level by 15 for 30 minutes daily as tolerated Consider checking BG at alternate times per day as directed by MD  Consider taking medication  as directed by MD Be sure to take Novolog (meal time insulin) before each meal but don't inject until food is on the table. Test blood sugar before meals to know how much insulin to inject before meals.

## 2013-11-10 ENCOUNTER — Ambulatory Visit: Payer: Medicaid Other | Admitting: Nutrition

## 2013-11-27 ENCOUNTER — Other Ambulatory Visit: Payer: Self-pay

## 2013-11-27 MED ORDER — METOLAZONE 5 MG PO TABS
5.0000 mg | ORAL_TABLET | ORAL | Status: DC
Start: 2013-11-27 — End: 2013-12-22

## 2013-11-27 NOTE — Telephone Encounter (Signed)
Rx sent to pharmacy   

## 2013-11-30 ENCOUNTER — Encounter: Payer: Medicaid Other | Admitting: Internal Medicine

## 2013-11-30 ENCOUNTER — Encounter: Payer: Self-pay | Admitting: *Deleted

## 2013-12-19 ENCOUNTER — Encounter: Payer: Self-pay | Admitting: *Deleted

## 2013-12-22 ENCOUNTER — Telehealth: Payer: Self-pay | Admitting: *Deleted

## 2013-12-22 MED ORDER — METOLAZONE 5 MG PO TABS: 5.0000 mg | ORAL_TABLET | ORAL | Status: DC

## 2013-12-22 MED ORDER — FUROSEMIDE 40 MG PO TABS: 40.0000 mg | ORAL_TABLET | Freq: Two times a day (BID) | ORAL | Status: DC

## 2013-12-22 NOTE — Telephone Encounter (Signed)
Pt needs furosemide, metelazone called in  [facility].

## 2013-12-22 NOTE — Telephone Encounter (Signed)
Refill complete 

## 2013-12-28 ENCOUNTER — Encounter (HOSPITAL_COMMUNITY): Payer: Self-pay | Admitting: Internal Medicine

## 2013-12-29 ENCOUNTER — Other Ambulatory Visit: Payer: Self-pay

## 2013-12-29 MED ORDER — CLONIDINE HCL 0.2 MG PO TABS: 0.2000 mg | ORAL_TABLET | Freq: Two times a day (BID) | ORAL | Status: DC

## 2014-01-08 ENCOUNTER — Other Ambulatory Visit: Payer: Self-pay | Admitting: Cardiovascular Disease

## 2014-01-08 MED ORDER — LOSARTAN POTASSIUM 100 MG PO TABS: 100.0000 mg | ORAL_TABLET | Freq: Every day | ORAL | Status: DC

## 2014-01-08 NOTE — Telephone Encounter (Signed)
Refill complete 

## 2014-01-08 NOTE — Telephone Encounter (Signed)
Received fax refill request  Rx # [id number]  Medication:  Losartan 100 mg Qty 90 Sig:  Take one tablet daily Physician:  Purvis Sheffield

## 2014-01-15 ENCOUNTER — Other Ambulatory Visit: Payer: Self-pay | Admitting: Cardiovascular Disease

## 2014-01-15 NOTE — Telephone Encounter (Signed)
Received fax refill request  Rx # [id number] Medication:  Gabapentin 300 mg capsule Qty 270 Sig:  Take one capsule three times daily Physician:  Purvis Sheffield

## 2014-01-23 ENCOUNTER — Other Ambulatory Visit: Payer: Self-pay | Admitting: Cardiovascular Disease

## 2014-01-23 MED ORDER — SIMVASTATIN 20 MG PO TABS: 20.0000 mg | ORAL_TABLET | Freq: Every day | ORAL | Status: DC

## 2014-01-23 NOTE — Telephone Encounter (Signed)
Received fax refill request  Rx # [id number] Medication:  Simvastatin 20 mg tablet Qty 90 Sig:  Take one tablet at bedtime Physician:  Purvis Sheffield

## 2014-01-23 NOTE — Telephone Encounter (Signed)
Refill complete 

## 2014-01-29 ENCOUNTER — Other Ambulatory Visit: Payer: Self-pay | Admitting: Cardiovascular Disease

## 2014-01-29 MED ORDER — OMEPRAZOLE 20 MG PO CPDR: 20.0000 mg | DELAYED_RELEASE_CAPSULE | Freq: Two times a day (BID) | ORAL | Status: DC

## 2014-01-29 NOTE — Telephone Encounter (Signed)
Received fax refill request  Rx # [id number] Medication:  Omeprazole DR 20 mg capsule Qty 180  Sig:  Take one capsule twice daily Physician:  Purvis Sheffield

## 2014-01-31 ENCOUNTER — Telehealth: Payer: Self-pay | Admitting: *Deleted

## 2014-01-31 ENCOUNTER — Other Ambulatory Visit: Payer: Self-pay | Admitting: *Deleted

## 2014-01-31 MED ORDER — SPIRONOLACTONE 25 MG PO TABS: 25.0000 mg | ORAL_TABLET | Freq: Every day | ORAL | Status: DC

## 2014-01-31 NOTE — Telephone Encounter (Signed)
Error

## 2014-02-26 ENCOUNTER — Other Ambulatory Visit: Payer: Self-pay | Admitting: Cardiovascular Disease

## 2014-02-26 MED ORDER — METOPROLOL SUCCINATE ER 100 MG PO TB24: 100.0000 mg | ORAL_TABLET | Freq: Every day | ORAL | Status: DC

## 2014-02-26 NOTE — Telephone Encounter (Signed)
Received fax refill request  Rx # [id number]  Medication:  Metoprolol ER 100 mg Qty 30  Sig:  Take one tablet daily with or immediately following a meal Physician:  Purvis Sheffield

## 2014-03-23 ENCOUNTER — Encounter: Payer: Medicaid Other | Admitting: Internal Medicine

## 2014-03-23 ENCOUNTER — Encounter: Payer: Self-pay | Admitting: Internal Medicine

## 2014-04-13 ENCOUNTER — Telehealth: Payer: Self-pay

## 2014-04-13 NOTE — Telephone Encounter (Signed)
Needs PA for Losartan,they want patient to have tried a previous Ace before approving this medication.I do not se where he has ever had any other ACE   Call Monday 3/28 for decision [phone]  PA # [id number]

## 2014-04-17 ENCOUNTER — Telehealth: Payer: Self-pay

## 2014-04-17 NOTE — Telephone Encounter (Signed)
PA [id number] aproved for losartan 04/13/14 thru 04/13/15, [facility] notified

## 2014-04-30 ENCOUNTER — Other Ambulatory Visit: Payer: Self-pay

## 2014-04-30 MED ORDER — POTASSIUM CHLORIDE CRYS ER 20 MEQ PO TBCR: 20.0000 meq | EXTENDED_RELEASE_TABLET | Freq: Every day | ORAL | Status: DC

## 2014-04-30 NOTE — Telephone Encounter (Signed)
Refill complete 

## 2014-05-11 ENCOUNTER — Other Ambulatory Visit: Payer: Self-pay | Admitting: Cardiovascular Disease

## 2014-05-11 MED ORDER — FUROSEMIDE 40 MG PO TABS: 40.0000 mg | ORAL_TABLET | Freq: Two times a day (BID) | ORAL | Status: DC

## 2014-05-11 NOTE — Telephone Encounter (Signed)
Received fax refill request  Rx # [id number] Medication:  Furosemide 40mg  tablet Qty 90 Sig:  Take one tab twice a day will take 1 extra tab based on edema Physician:  Purvis Sheffield

## 2014-06-19 ENCOUNTER — Other Ambulatory Visit: Payer: Self-pay | Admitting: Cardiovascular Disease

## 2014-06-19 MED ORDER — METOPROLOL SUCCINATE ER 100 MG PO TB24: 100.0000 mg | ORAL_TABLET | Freq: Every day | ORAL | Status: DC

## 2014-06-19 NOTE — Telephone Encounter (Signed)
Received fax refill request  Rx # [id number] Medication:  Metoprolol SUCC ER 100 mg  Qty 30 Sig:  Take one tablet once daily with food Physician:  Purvis Sheffield

## 2014-06-19 NOTE — Telephone Encounter (Signed)
Needs apt for future refills

## 2014-06-22 ENCOUNTER — Encounter: Payer: Self-pay | Admitting: Internal Medicine

## 2014-06-22 ENCOUNTER — Ambulatory Visit (INDEPENDENT_AMBULATORY_CARE_PROVIDER_SITE_OTHER): Payer: Medicaid Other | Admitting: Internal Medicine

## 2014-06-22 VITALS — BP 128/68 | HR 79 | Ht 70.5 in | Wt 292.0 lb

## 2014-06-22 DIAGNOSIS — I5023 Acute on chronic systolic (congestive) heart failure: Secondary | ICD-10-CM | POA: Diagnosis not present

## 2014-06-22 DIAGNOSIS — Z95810 Presence of automatic (implantable) cardiac defibrillator: Secondary | ICD-10-CM

## 2014-06-22 DIAGNOSIS — I10 Essential (primary) hypertension: Secondary | ICD-10-CM | POA: Diagnosis not present

## 2014-06-22 DIAGNOSIS — I429 Cardiomyopathy, unspecified: Secondary | ICD-10-CM | POA: Diagnosis not present

## 2014-06-22 DIAGNOSIS — I428 Other cardiomyopathies: Secondary | ICD-10-CM

## 2014-06-22 LAB — CUP PACEART INCLINIC DEVICE CHECK
Battery Voltage: 3.03 V
Brady Statistic AP VP Percent: 0.01 %
Brady Statistic AP VP Percent: 0.01 %
Brady Statistic AP VS Percent: 2.42 %
Brady Statistic AP VS Percent: 2.42 %
Brady Statistic AS VP Percent: 0.03 %
Brady Statistic AS VS Percent: 97.54 %
Brady Statistic AS VS Percent: 97.54 %
Brady Statistic RA Percent Paced: 2.43 %
Brady Statistic RA Percent Paced: 2.43 %
Brady Statistic RV Percent Paced: 0.03 %
Date Time Interrogation Session: 20160603102054
HIGH POWER IMPEDANCE MEASURED VALUE: 171 Ohm
HIGH POWER IMPEDANCE MEASURED VALUE: 342 Ohm
HIGH POWER IMPEDANCE MEASURED VALUE: 44 Ohm
HighPow Impedance: 342 Ohm
HighPow Impedance: 44 Ohm
HighPow Impedance: 53 Ohm
HighPow Impedance: 171 Ohm
HighPow Impedance: 53 Ohm
Lead Channel Impedance Value: 399 Ohm
Lead Channel Impedance Value: 399 Ohm
Lead Channel Pacing Threshold Amplitude: 0.5 V
Lead Channel Pacing Threshold Amplitude: 0.5 V
Lead Channel Pacing Threshold Pulse Width: 0.4 ms
Lead Channel Pacing Threshold Pulse Width: 0.4 ms
Lead Channel Pacing Threshold Pulse Width: 0.4 ms
Lead Channel Sensing Intrinsic Amplitude: 2.75 mV
Lead Channel Sensing Intrinsic Amplitude: 2.75 mV
Lead Channel Sensing Intrinsic Amplitude: 7.625 mV
Lead Channel Setting Pacing Amplitude: 2 V
Lead Channel Setting Pacing Amplitude: 2.5 V
Lead Channel Setting Pacing Pulse Width: 0.4 ms
Lead Channel Setting Pacing Pulse Width: 0.4 ms
Lead Channel Setting Sensing Sensitivity: 0.6 mV
MDC IDC MSMT BATTERY VOLTAGE: 3.03 V
MDC IDC MSMT LEADCHNL RA IMPEDANCE VALUE: 456 Ohm
MDC IDC MSMT LEADCHNL RA IMPEDANCE VALUE: 456 Ohm
MDC IDC MSMT LEADCHNL RA PACING THRESHOLD AMPLITUDE: 0.5 V
MDC IDC MSMT LEADCHNL RA PACING THRESHOLD AMPLITUDE: 0.5 V
MDC IDC MSMT LEADCHNL RV PACING THRESHOLD PULSEWIDTH: 0.4 ms
MDC IDC MSMT LEADCHNL RV SENSING INTR AMPL: 7.625 mV
MDC IDC SESS DTM: 20160603121432
MDC IDC SET LEADCHNL RA PACING AMPLITUDE: 2 V
MDC IDC SET LEADCHNL RV PACING AMPLITUDE: 2.5 V
MDC IDC SET LEADCHNL RV SENSING SENSITIVITY: 0.6 mV
MDC IDC SET ZONE DETECTION INTERVAL: 360 ms
MDC IDC STAT BRADY AS VP PERCENT: 0.03 %
MDC IDC STAT BRADY RV PERCENT PACED: 0.03 %
Zone Setting Detection Interval: 300 ms
Zone Setting Detection Interval: 350 ms
Zone Setting Detection Interval: 360 ms
Zone Setting Detection Interval: 360 ms
Zone Setting Detection Interval: 300 ms
Zone Setting Detection Interval: 350 ms
Zone Setting Detection Interval: 360 ms

## 2014-06-22 NOTE — Patient Instructions (Signed)
Your physician recommends that you schedule a follow-up appointment in 3 months with [facility]  Your physician wants you to follow-up in: with [name]. You will receive a reminder letter in the mail two months in advance. If you don't receive a letter, please call our office to schedule the follow-up appointment.  Your physician recommends that you continue on your current medications as directed. Please refer to the Current Medication list given to you today.  Thank you for choosing [facility]!

## 2014-06-22 NOTE — Assessment & Plan Note (Signed)
Her symptoms are well compensated. She will continue her current meds. I have encouraged her to reduce her sodium intake, lose weight and continue her current meds.

## 2014-06-22 NOTE — Assessment & Plan Note (Signed)
Her blood pressure is well controlled today. No change in meds. She is encouraged to lose weight.

## 2014-06-22 NOTE — Assessment & Plan Note (Signed)
Her medtronic ICD is working normally. Will recheck in several months. She has had NSVT.

## 2014-06-22 NOTE — Progress Notes (Signed)
HPI Katherine [name] returns today for followup. She is a pleasant 54 yo woman with a non-ischemic CM, chronic systolic heart failure, SLE, fibromyalgia, HTN,and obesity. She is s/p ICD implantation and returns today to establish followup in our clinic. She denies syncope or ICD shock. She admits to being sedentary. Allergies  Allergen Reactions  . Penicillins Itching    Can take amoxicillin without a reaction  . Shellfish Allergy Other (See Comments)    Causes to have grand mal seizures     Current Outpatient Prescriptions  Medication Sig Dispense Refill  . albuterol (PROVENTIL HFA;VENTOLIN HFA) 108 (90 BASE) MCG/ACT inhaler Inhale 2 puffs into the lungs every 6 (six) hours as needed. For wheezing and shortness of breath    . albuterol (PROVENTIL) (2.5 MG/3ML) 0.083% nebulizer solution Take 3 mLs (2.5 mg total) by nebulization every 6 (six) hours as needed. For wheezing and shortness of breath 75 mL 3  . aspirin EC 81 MG tablet Take 81 mg by mouth daily.      . cloNIDine (CATAPRES) 0.2 MG tablet Take 1 tablet (0.2 mg total) by mouth 2 (two) times daily. 60 tablet 6  . clopidogrel (PLAVIX) 75 MG tablet Take 1 tablet (75 mg total) by mouth daily. 30 tablet 11  . Fluticasone-Salmeterol (ADVAIR) 250-50 MCG/DOSE AEPB Inhale 1 puff into the lungs every morning.      . furosemide (LASIX) 40 MG tablet Take 1 tablet (40 mg total) by mouth 2 (two) times daily. Will take 1 extra dose based on edema 90 tablet 2  . gabapentin (NEURONTIN) 300 MG capsule Take 300 mg by mouth 3 (three) times daily.      . hydroxychloroquine (PLAQUENIL) 200 MG tablet Take 200 mg by mouth 2 (two) times daily.      Marland Kitchen ibuprofen (ADVIL,MOTRIN) 400 MG tablet Take 400 mg by mouth every 8 (eight) hours as needed.    . Insulin Aspart (NOVOLOG FLEXPEN ) Inject 50 Units into the skin.    Marland Kitchen insulin aspart (NOVOLOG) 100 UNIT/ML injection Inject 10 Units into the skin 3 (three) times daily before meals.    . insulin detemir  (LEVEMIR) 100 UNIT/ML injection Inject 50 Units into the skin at bedtime.    Marland Kitchen loratadine (CLARITIN) 10 MG tablet Take 10 mg by mouth daily as needed. For allergies     . LORazepam (ATIVAN) 1 MG tablet Take 0.5-1 mg by mouth daily as needed. anxiety     . losartan (COZAAR) 100 MG tablet Take 1 tablet (100 mg total) by mouth daily. 90 tablet 3  . metolazone (ZAROXOLYN) 5 MG tablet Take 1 tablet (5 mg total) by mouth every other day. Take Monday, Wednesday, Friday 30 tablet 9  . metoprolol succinate (TOPROL-XL) 100 MG 24 hr tablet Take 1 tablet (100 mg total) by mouth daily. Take with or immediately following a meal. 90 tablet 0  . omeprazole (PRILOSEC) 20 MG capsule Take 1 capsule (20 mg total) by mouth 2 (two) times daily. 180 capsule 3  . potassium chloride SA (K-DUR,KLOR-CON) 20 MEQ tablet Take 1 tablet (20 mEq total) by mouth daily. 90 tablet 3  . promethazine (PHENERGAN) 25 MG tablet Take 25 mg by mouth every 8 (eight) hours as needed. nausea     . simvastatin (ZOCOR) 20 MG tablet Take 1 tablet (20 mg total) by mouth at bedtime. 30 tablet 9  . spironolactone (ALDACTONE) 25 MG tablet Take 1 tablet (25 mg total) by mouth daily. (Patient  taking differently: Take 100 mg by mouth daily. ) 90 tablet 2  . traMADol (ULTRAM) 50 MG tablet Take by mouth 2 (two) times daily as needed.    . venlafaxine XR (EFFEXOR-XR) 150 MG 24 hr capsule Take 450 mg by mouth daily.     No current facility-administered medications for this visit.     Past Medical History  Diagnosis Date  . Angina   . Asthma   . Shortness of breath   . Bronchitis   . Lupus   . Seizures     per patient statement  . Hypertension   . Stroke   . CHF (congestive heart failure)   . Pneumonia   . GERD (gastroesophageal reflux disease)   . Headache(784.0)   . Diabetes mellitus   . Fibromyalgia   . Nonischemic cardiomyopathy     NYHA classII  . Fibromyalgia 10/03/2012    ROS:   All systems reviewed and negative except as noted  in the HPI.   Past Surgical History  Procedure Laterality Date  . Icd  02/13/2011    Medtronic Protecta DR XT  . Tubal ligation    . Right ankle    . US echocardiography  11/09/2010    mild LV enlargement w/conc. LVH & severe global hypokinesis EF 30-35%,mod. diastolic dysfunction, mod. TR,mild AI,mild to mod MR,mild PI  . Left heart catheterization with coronary angiogram N/A 12/05/2010    Procedure: LEFT HEART CATHETERIZATION WITH CORONARY ANGIOGRAM;  Surgeon: Chrystie Nose, MD;  Location: [facility] CATH LAB;  Service: Cardiovascular;  Laterality: N/A;  . Implantable cardioverter defibrillator implant N/A 02/13/2011    Procedure: IMPLANTABLE CARDIOVERTER DEFIBRILLATOR IMPLANT;  Surgeon: Thurmon Fair, MD;  Location: [facility] CATH LAB;  Service: Cardiovascular;  Laterality: N/A;     No family history on file.   History   Social History  . Marital Status: Legally Separated    Spouse Name: N/A  . Number of Children: N/A  . Years of Education: N/A   Occupational History  . Not on file.   Social History Main Topics  . Smoking status: Former Smoker -- 1.00 packs/day    Types: Cigarettes    Quit date: 10/03/2005  . Smokeless tobacco: Never Used  . Alcohol Use: 0.0 oz/week    0 Standard drinks or equivalent per week     Comment: quit 2007  . Drug Use: No  . Sexual Activity: Yes   Other Topics Concern  . Not on file   Social History Narrative     BP 128/68 mmHg  Pulse 79  Ht 5' 10.5" (1.791 m)  Wt 292 lb (132.45 kg)  BMI 41.29 kg/m2  SpO2 96%  Physical Exam:  obese appearing 54 yo woman, NAD HEENT: Unremarkable Neck:  7 cm JVD, no thyromegally Back:  No CVA tenderness Lungs:  Clear with no wheezes, well healed ICD incision. HEART:  Regular rate rhythm, no murmurs, no rubs, no clicks Abd:  soft, obese, positive bowel sounds, no organomegally, no rebound, no guarding Ext:  2 plus pulses, no edema, no cyanosis, no clubbing Skin:  No rashes no nodules Neuro:  CN II  through XII intact, motor grossly intact   DEVICE  Normal device function.  See PaceArt for details.   Assess/Plan:

## 2014-06-29 ENCOUNTER — Encounter: Payer: Self-pay | Admitting: Cardiovascular Disease

## 2014-07-30 ENCOUNTER — Other Ambulatory Visit: Payer: Self-pay

## 2014-07-30 MED ORDER — LOSARTAN POTASSIUM 100 MG PO TABS: 100.0000 mg | ORAL_TABLET | Freq: Every day | ORAL | Status: DC

## 2014-08-03 ENCOUNTER — Other Ambulatory Visit: Payer: Self-pay

## 2014-08-03 MED ORDER — CLONIDINE HCL 0.2 MG PO TABS: 0.2000 mg | ORAL_TABLET | Freq: Two times a day (BID) | ORAL | Status: DC

## 2014-09-26 LAB — HEMOGLOBIN A1C: HEMOGLOBIN A1C: 11.4 % — AB (ref 4.0–6.0)

## 2014-10-08 ENCOUNTER — Other Ambulatory Visit: Payer: Self-pay

## 2014-10-08 MED ORDER — FUROSEMIDE 40 MG PO TABS: 40.0000 mg | ORAL_TABLET | Freq: Two times a day (BID) | ORAL | Status: DC

## 2014-10-11 ENCOUNTER — Encounter: Payer: Self-pay | Admitting: Internal Medicine

## 2014-10-11 ENCOUNTER — Ambulatory Visit (INDEPENDENT_AMBULATORY_CARE_PROVIDER_SITE_OTHER): Payer: Medicaid Other | Admitting: *Deleted

## 2014-10-11 DIAGNOSIS — I429 Cardiomyopathy, unspecified: Secondary | ICD-10-CM

## 2014-10-11 DIAGNOSIS — I428 Other cardiomyopathies: Secondary | ICD-10-CM

## 2014-10-11 DIAGNOSIS — I5023 Acute on chronic systolic (congestive) heart failure: Secondary | ICD-10-CM

## 2014-10-11 LAB — CUP PACEART INCLINIC DEVICE CHECK
Battery Voltage: 3.01 V
Brady Statistic AP VP Percent: 0 %
Brady Statistic AS VP Percent: 0.03 %
Brady Statistic RA Percent Paced: 0.74 %
Date Time Interrogation Session: 20160922132914
HIGH POWER IMPEDANCE MEASURED VALUE: 304 Ohm
HIGH POWER IMPEDANCE MEASURED VALUE: 48 Ohm
HighPow Impedance: 133 Ohm
HighPow Impedance: 40 Ohm
Lead Channel Impedance Value: 399 Ohm
Lead Channel Impedance Value: 399 Ohm
Lead Channel Pacing Threshold Amplitude: 0.5 V
Lead Channel Pacing Threshold Pulse Width: 0.4 ms
Lead Channel Sensing Intrinsic Amplitude: 2 mV
Lead Channel Sensing Intrinsic Amplitude: 2.875 mV
Lead Channel Sensing Intrinsic Amplitude: 8.125 mV
Lead Channel Setting Pacing Amplitude: 2 V
Lead Channel Setting Pacing Amplitude: 2.5 V
Lead Channel Setting Sensing Sensitivity: 0.6 mV
MDC IDC MSMT LEADCHNL RA PACING THRESHOLD AMPLITUDE: 0.625 V
MDC IDC MSMT LEADCHNL RA PACING THRESHOLD PULSEWIDTH: 0.4 ms
MDC IDC MSMT LEADCHNL RV SENSING INTR AMPL: 6.625 mV
MDC IDC SET LEADCHNL RV PACING PULSEWIDTH: 0.4 ms
MDC IDC SET ZONE DETECTION INTERVAL: 360 ms
MDC IDC SET ZONE DETECTION INTERVAL: 360 ms
MDC IDC STAT BRADY AP VS PERCENT: 0.74 %
MDC IDC STAT BRADY AS VS PERCENT: 99.23 %
MDC IDC STAT BRADY RV PERCENT PACED: 0.03 %
Zone Setting Detection Interval: 300 ms
Zone Setting Detection Interval: 350 ms

## 2014-10-11 NOTE — Progress Notes (Signed)
ICD check in clinic. Normal device function. Thresholds and sensing consistent with previous device measurements. Impedance trends stable over time. (1) ventricular arrhythmia x 7 bts @ 125/200. No mode switches. Abnormal thoracic impedance since 09/02/14---patient c/o LE edema and abdominal bloat x 3-4 days---appt made with Joni Reining for 10/12/14. Histogram distribution appropriate for patient and level of activity. No changes made this session. Device programmed at appropriate safety margins. Device programmed to optimize intrinsic conduction. Batt voltage 3.01V (ERI 2.63V). Pt enrolled in remote follow-up. Plan to follow up via [facility] on 12/22 and with [facility] in 06-2015. Patient education completed including shock plan. Alert tones demonstrated for patient.

## 2014-10-12 ENCOUNTER — Encounter: Payer: Self-pay | Admitting: Adult Health

## 2014-10-12 ENCOUNTER — Ambulatory Visit (INDEPENDENT_AMBULATORY_CARE_PROVIDER_SITE_OTHER): Payer: Medicaid Other | Admitting: Adult Health

## 2014-10-12 VITALS — BP 94/62 | HR 76 | Ht 70.0 in | Wt 293.0 lb

## 2014-10-12 DIAGNOSIS — E860 Dehydration: Secondary | ICD-10-CM | POA: Diagnosis not present

## 2014-10-12 DIAGNOSIS — Z79899 Other long term (current) drug therapy: Secondary | ICD-10-CM | POA: Diagnosis not present

## 2014-10-12 DIAGNOSIS — I420 Dilated cardiomyopathy: Secondary | ICD-10-CM | POA: Diagnosis not present

## 2014-10-12 DIAGNOSIS — I9589 Other hypotension: Secondary | ICD-10-CM

## 2014-10-12 DIAGNOSIS — I5032 Chronic diastolic (congestive) heart failure: Secondary | ICD-10-CM | POA: Diagnosis not present

## 2014-10-12 LAB — BASIC METABOLIC PANEL WITH GFR
BUN: 52 mg/dL — ABNORMAL HIGH (ref 7–25)
CO2: 26 mmol/L (ref 20–31)
Calcium: 9 mg/dL (ref 8.6–10.4)
Chloride: 103 mmol/L (ref 98–110)
Creat: 2.67 mg/dL — ABNORMAL HIGH (ref 0.50–1.05)
Glucose, Bld: 101 mg/dL — ABNORMAL HIGH (ref 65–99)
Potassium: 4.3 mmol/L (ref 3.5–5.3)
Sodium: 137 mmol/L (ref 135–146)

## 2014-10-12 MED ORDER — METOPROLOL SUCCINATE ER 100 MG PO TB24: 100.0000 mg | ORAL_TABLET | Freq: Every day | ORAL | Status: DC

## 2014-10-12 NOTE — Progress Notes (Signed)
Cardiology Office Note   Date:  10/12/2014   ID:  EBONEE OLEARY, DOB [birth year], MRN [id number]  PCP:  Alleen Borne  Cardiologist:  Inis Sizer, NP   No chief complaint on file.     History of Present Illness: Katherine [name] is a 54 y.o. female who presents for ongoing assessment and management of nonischemic cardiomyopathy EF of 28%,, chronic systolic heart failure, with a history of hypertension, SLE, fibromyalgia, and obesity.  The patient has then treated by [name] in the setting of ICD implantation.  She was last seen by [name] in June of 2016. There were no changes in her medical management.she was advised to reduce sodium intake  She comes today feeling weak and tired.  She had been having issues with fluid overload, as she was eating a lot of diet dinners pickles and other salted foods.  When she began to swell.  She took extra doses of metolazone and Lasix.  The patient took 3 doses of Lasix today, had been taking daily, metolazone.  She has a day, hypotensive, weak, fatigued, and dizzy. She has other multiple somatic complaints.  Past Medical History  Diagnosis Date  . Angina   . Asthma   . Shortness of breath   . Bronchitis   . Lupus   . Seizures     per patient statement  . Hypertension   . Stroke   . CHF (congestive heart failure)   . Pneumonia   . GERD (gastroesophageal reflux disease)   . Headache(784.0)   . Diabetes mellitus   . Fibromyalgia   . Nonischemic cardiomyopathy     NYHA classII  . Fibromyalgia 10/03/2012    Past Surgical History  Procedure Laterality Date  . Icd  02/13/2011    Medtronic Protecta DR XT  . Tubal ligation    . Right ankle    . US echocardiography  11/09/2010    mild LV enlargement w/conc. LVH & severe global hypokinesis EF 30-35%,mod. diastolic dysfunction, mod. TR,mild AI,mild to mod MR,mild PI  . Left heart catheterization with coronary angiogram N/A 12/05/2010    Procedure: LEFT HEART  CATHETERIZATION WITH CORONARY ANGIOGRAM;  Surgeon: Chrystie Nose, MD;  Location: [facility] CATH LAB;  Service: Cardiovascular;  Laterality: N/A;  . Implantable cardioverter defibrillator implant N/A 02/13/2011    Procedure: IMPLANTABLE CARDIOVERTER DEFIBRILLATOR IMPLANT;  Surgeon: Thurmon Fair, MD;  Location: [facility] CATH LAB;  Service: Cardiovascular;  Laterality: N/A;     Current Outpatient Prescriptions  Medication Sig Dispense Refill  . albuterol (PROVENTIL HFA;VENTOLIN HFA) 108 (90 BASE) MCG/ACT inhaler Inhale 2 puffs into the lungs every 6 (six) hours as needed. For wheezing and shortness of breath    . albuterol (PROVENTIL) (2.5 MG/3ML) 0.083% nebulizer solution Take 3 mLs (2.5 mg total) by nebulization every 6 (six) hours as needed. For wheezing and shortness of breath 75 mL 3  . aspirin EC 81 MG tablet Take 81 mg by mouth daily.      . cetirizine (ZYRTEC) 10 MG tablet Take 10 mg by mouth as needed for allergies.    . cloNIDine (CATAPRES) 0.2 MG tablet Take 1 tablet (0.2 mg total) by mouth 2 (two) times daily. 60 tablet 6  . Fluticasone-Salmeterol (ADVAIR) 500-50 MCG/DOSE AEPB Inhale 2 puffs into the lungs 2 (two) times daily.    . furosemide (LASIX) 40 MG tablet Take 1 tablet (40 mg total) by mouth 2 (two) times daily. Will take 1 extra dose based on edema  90 tablet 2  . gabapentin (NEURONTIN) 300 MG capsule Take 300 mg by mouth 3 (three) times daily.      . hydroxychloroquine (PLAQUENIL) 200 MG tablet Take 200 mg by mouth 2 (two) times daily.      Marland Kitchen ibuprofen (ADVIL,MOTRIN) 600 MG tablet Take 600 mg by mouth every 8 (eight) hours as needed.    . Insulin Aspart (NOVOLOG FLEXPEN [place]) Inject into the skin 3 (three) times daily. SSI    . insulin detemir (LEVEMIR) 100 UNIT/ML injection Inject 70 Units into the skin at bedtime.     Marland Kitchen loratadine (CLARITIN) 10 MG tablet Take 10 mg by mouth daily as needed. For allergies     . losartan (COZAAR) 100 MG tablet Take 1 tablet (100 mg total) by mouth daily.  90 tablet 3  . meloxicam (MOBIC) 7.5 MG tablet Take 7.5 mg by mouth 2 (two) times daily as needed for pain.    . metolazone (ZAROXOLYN) 5 MG tablet Take 1 tablet (5 mg total) by mouth every other day. Take Monday, Wednesday, Friday 30 tablet 9  . metoprolol succinate (TOPROL-XL) 100 MG 24 hr tablet Take 1 tablet (100 mg total) by mouth daily. Take with or immediately following a meal. 90 tablet 0  . omeprazole (PRILOSEC) 20 MG capsule Take 1 capsule (20 mg total) by mouth 2 (two) times daily. 180 capsule 3  . promethazine (PHENERGAN) 25 MG tablet Take 25 mg by mouth every 8 (eight) hours as needed. nausea     . simvastatin (ZOCOR) 20 MG tablet Take 1 tablet (20 mg total) by mouth at bedtime. 30 tablet 9  . spironolactone (ALDACTONE) 100 MG tablet Take 100 mg by mouth daily.    Marland Kitchen venlafaxine XR (EFFEXOR-XR) 150 MG 24 hr capsule Take 450 mg by mouth daily.     No current facility-administered medications for this visit.    Allergies:   Penicillins and Shellfish allergy    Social History:  The patient  reports that she quit smoking about 9 years ago. Her smoking use included Cigarettes. She smoked 1.00 pack per day. She has never used smokeless tobacco. She reports that she drinks alcohol. She reports that she does not use illicit drugs.   Family History:  The patient's family history is not on file.    ROS: All other systems are reviewed and negative. Unless otherwise mentioned in H&P    PHYSICAL EXAM: VS:  BP 94/62 mmHg  Pulse 76  Ht  (1.778 m)  Wt 293 lb (132.904 kg)  BMI 42.04 kg/m2  SpO2 96% , BMI Body mass index is 42.04 kg/(m^2). GEN: Well nourished, well developed, in no acute distress HEENT: normal Neck: no JVD, carotid bruits, or masses Cardiac: RRR; tachycardic,no murmurs, rubs, or gallops,no edema  Respiratory:  clear to auscultation bilaterally, normal work of breathing GI: soft, nontender, nondistended, + BS MS: no deformity or atrophy Skin: warm and dry, no  rash Neuro:  Strength and sensation are intact Psych: euthymic mood, full affect   Recent Labs: No results found for requested labs within last 365 days.    Lipid Panel No results found for: CHOL, TRIG, HDL, CHOLHDL, VLDL, LDLCALC, LDLDIRECT    Wt Readings from Last 3 Encounters:  10/12/14 293 lb (132.904 kg)  06/22/14 292 lb (132.45 kg)  10/11/13 289 lb (131.09 kg)     ASSESSMENT AND PLAN:  1.  Acute on chronic diastolic heart failure: she has been noncompliant with diet, medication regimen, and  has been taking extra doses of diuretics.  Over the last few days to the point of which I believe is dehydration.  She is very weak, tired, and hypotensive.  The patient states that she be needing a lot of diet dinners, which were high in sodium and she was having some muscle cramps, and so.  She was eating pickles.  She began to have a significant amount of swelling.  She took over and above the amount of diuretics that were necessary for fluid retention.I rechecked her blood pressure in the clinic, and it was found to be 95/60.  I have asked her not to take any Lasix today or tomorrow (she had already taken 3 doses this morning). She will begin 40 mg twice a day, on the second day.  She is only to use metolazone as directed.  She is to avoid pre-process dinners and other highly salted foods to include the pickles.  I will order a BMET.  She will be seen again in 6 weeks  2. Hypertension:she is hypotensive today based on above.  She is to continue to take all of her other medications include clonidine.  I do not wish her to have some rebound.  Hypertension.  She verbalizes understanding  3. Questionable OSA:I discussed with her the possibility of having a sleep study, but at this time.  She is not medically stable to do so.  I would like to get her normotensive and feeling better before proceeding  Current medicines are reviewed at length with the patient today.    Labs/ tests ordered today  include: BMET No orders of the defined types were placed in this encounter.     Disposition:   FU with 6 weeks Signed, Kathryn LawrencJoni Reining016 3:41 PM    [facility] [address], [place], [place] [address] Phone: [phone]; Fax: [phone]

## 2014-10-12 NOTE — Patient Instructions (Signed)
Your physician recommends that you schedule a follow-up appointment in: 4-6 WEEKS WITH [name]  DO NOT TAKE ANY LASIX Saturday RESUME LASIX ON Sunday AT THE NORMAL DOSE 40 mg AM / 40 mg PM   Your physician recommends that you return for lab work TODAY - BMET  Thanks for choosing [facility]!!!

## 2014-10-12 NOTE — Progress Notes (Deleted)
Name: Katherine [name]    DOB: [birth year]  Age: 54 y.o.  MR#: [id number]       PCP:  Alleen Borne      Insurance: Payor: MEDICAID [place] / Plan: MEDICAID [place] ACCESS / Product Type: *No Product type* /   CC:   No chief complaint on file.   VS Filed Vitals:   10/12/14 1528  BP: 94/62  Pulse: 76  Height:  (1.778 m)  Weight: 293 lb (132.904 kg)  SpO2: 96%    Weights Current Weight  10/12/14 293 lb (132.904 kg)  06/22/14 292 lb (132.45 kg)  10/11/13 289 lb (131.09 kg)    Blood Pressure  BP Readings from Last 3 Encounters:  10/12/14 94/62  06/22/14 128/68  10/11/13 100/62     Admit date:  (Not on file) Last encounter with RMR:  Visit date not found   Allergy Penicillins and Shellfish allergy  Current Outpatient Prescriptions  Medication Sig Dispense Refill  . albuterol (PROVENTIL HFA;VENTOLIN HFA) 108 (90 BASE) MCG/ACT inhaler Inhale 2 puffs into the lungs every 6 (six) hours as needed. For wheezing and shortness of breath    . albuterol (PROVENTIL) (2.5 MG/3ML) 0.083% nebulizer solution Take 3 mLs (2.5 mg total) by nebulization every 6 (six) hours as needed. For wheezing and shortness of breath 75 mL 3  . aspirin EC 81 MG tablet Take 81 mg by mouth daily.      . cetirizine (ZYRTEC) 10 MG tablet Take 10 mg by mouth as needed for allergies.    . cloNIDine (CATAPRES) 0.2 MG tablet Take 1 tablet (0.2 mg total) by mouth 2 (two) times daily. 60 tablet 6  . Fluticasone-Salmeterol (ADVAIR) 500-50 MCG/DOSE AEPB Inhale 2 puffs into the lungs 2 (two) times daily.    . furosemide (LASIX) 40 MG tablet Take 1 tablet (40 mg total) by mouth 2 (two) times daily. Will take 1 extra dose based on edema 90 tablet 2  . gabapentin (NEURONTIN) 300 MG capsule Take 300 mg by mouth 3 (three) times daily.      . hydroxychloroquine (PLAQUENIL) 200 MG tablet Take 200 mg by mouth 2 (two) times daily.      Marland Kitchen ibuprofen (ADVIL,MOTRIN) 600 MG tablet Take 600 mg by mouth every 8 (eight) hours as  needed.    . Insulin Aspart (NOVOLOG FLEXPEN [place]) Inject into the skin 3 (three) times daily. SSI    . insulin detemir (LEVEMIR) 100 UNIT/ML injection Inject 70 Units into the skin at bedtime.     Marland Kitchen loratadine (CLARITIN) 10 MG tablet Take 10 mg by mouth daily as needed. For allergies     . losartan (COZAAR) 100 MG tablet Take 1 tablet (100 mg total) by mouth daily. 90 tablet 3  . meloxicam (MOBIC) 7.5 MG tablet Take 7.5 mg by mouth 2 (two) times daily as needed for pain.    . metolazone (ZAROXOLYN) 5 MG tablet Take 1 tablet (5 mg total) by mouth every other day. Take Monday, Wednesday, Friday 30 tablet 9  . metoprolol succinate (TOPROL-XL) 100 MG 24 hr tablet Take 1 tablet (100 mg total) by mouth daily. Take with or immediately following a meal. 90 tablet 0  . omeprazole (PRILOSEC) 20 MG capsule Take 1 capsule (20 mg total) by mouth 2 (two) times daily. 180 capsule 3  . promethazine (PHENERGAN) 25 MG tablet Take 25 mg by mouth every 8 (eight) hours as needed. nausea     . simvastatin (ZOCOR) 20 MG tablet  Take 1 tablet (20 mg total) by mouth at bedtime. 30 tablet 9  . spironolactone (ALDACTONE) 100 MG tablet Take 100 mg by mouth daily.    Marland Kitchen venlafaxine XR (EFFEXOR-XR) 150 MG 24 hr capsule Take 450 mg by mouth daily.     No current facility-administered medications for this visit.    Discontinued Meds:    Medications Discontinued During This Encounter  Medication Reason  . LORazepam (ATIVAN) 1 MG tablet Error  . spironolactone (ALDACTONE) 25 MG tablet Error  . traMADol (ULTRAM) 50 MG tablet Error    Patient Active Problem List   Diagnosis Date Noted  . Acute on chronic systolic congestive heart failure, NYHA class 2 11/17/2012  . Non-ischemic cardiomyopathy 10/03/2012  . AICD (automatic cardioverter/defibrillator) present 10/03/2012  . HTN (hypertension) 10/03/2012  . DM2 (diabetes mellitus, type 2) 10/03/2012  . Fibromyalgia 10/03/2012  . Dyslipidemia 10/03/2012  . Lupus 10/03/2012   . Stroke 10/03/2012    LABS    Component Value Date/Time   NA 140 10/03/2012 1355   NA 137 12/25/2010 1004   K 3.9 10/03/2012 1355   K 3.3* 12/25/2010 1004   CL 103 10/03/2012 1355   CL 99 12/25/2010 1004   CO2 29 10/03/2012 1355   CO2 28 12/25/2010 1004   GLUCOSE 127* 10/03/2012 1355   GLUCOSE 206* 12/25/2010 1004   BUN 17 10/03/2012 1355   BUN 17 12/25/2010 1004   CREATININE 1.10 10/03/2012 1355   CREATININE 0.86 12/25/2010 1004   CALCIUM 9.2 10/03/2012 1355   CALCIUM 9.1 12/25/2010 1004   GFRNONAA 77* 12/25/2010 1004   GFRAA 90* 12/25/2010 1004   CMP     Component Value Date/Time   NA 140 10/03/2012 1355   K 3.9 10/03/2012 1355   CL 103 10/03/2012 1355   CO2 29 10/03/2012 1355   GLUCOSE 127* 10/03/2012 1355   BUN 17 10/03/2012 1355   CREATININE 1.10 10/03/2012 1355   CREATININE 0.86 12/25/2010 1004   CALCIUM 9.2 10/03/2012 1355   PROT 7.4 10/03/2012 1355   ALBUMIN 3.9 10/03/2012 1355   AST 15 10/03/2012 1355   ALT 12 10/03/2012 1355   ALKPHOS 102 10/03/2012 1355   BILITOT 0.6 10/03/2012 1355   GFRNONAA 77* 12/25/2010 1004   GFRAA 90* 12/25/2010 1004    No results found for: WBC, HGB, HCT, MCV  Lipid Panel  No results found for: CHOL, TRIG, HDL, CHOLHDL, VLDL, LDLCALC, LDLDIRECT  ABG No results found for: PHART, PCO2ART, PO2ART, HCO3, TCO2, ACIDBASEDEF, O2SAT   No results found for: TSH BNP (last 3 results) No results for input(s): BNP in the last 8760 hours.  ProBNP (last 3 results) No results for input(s): PROBNP in the last 8760 hours.  Cardiac Panel (last 3 results) No results for input(s): CKTOTAL, CKMB, TROPONINI, RELINDX in the last 72 hours.  Iron/TIBC/Ferritin/ %Sat No results found for: IRON, TIBC, FERRITIN, IRONPCTSAT   EKG Orders placed or performed in visit on 10/11/13  . EKG 12-Lead     Prior Assessment and Plan Problem List as of 10/12/2014      Cardiovascular and Mediastinum   Non-ischemic cardiomyopathy   HTN  (hypertension)   Last Assessment & Plan 06/22/2014 Office Visit Written 06/22/2014 10:38 AM by Marinus Maw, MD    Her blood pressure is well controlled today. No change in meds. She is encouraged to lose weight.      Stroke   Acute on chronic systolic congestive heart failure, NYHA class 2  Last Assessment & Plan 06/22/2014 Office Visit Written 06/22/2014 10:37 AM by Marinus Maw, MD    Her symptoms are well compensated. She will continue her current meds. I have encouraged her to reduce her sodium intake, lose weight and continue her current meds.        Endocrine   DM2 (diabetes mellitus, type 2)     Musculoskeletal and Integument   Fibromyalgia     Other   AICD (automatic cardioverter/defibrillator) present   Last Assessment & Plan 06/22/2014 Office Visit Written 06/22/2014 10:39 AM by Marinus Maw, MD    Her medtronic ICD is working normally. Will recheck in several months. She has had NSVT.      Dyslipidemia   Lupus       Imaging: No results found.

## 2014-10-15 ENCOUNTER — Telehealth: Payer: Self-pay

## 2014-10-15 DIAGNOSIS — Z79899 Other long term (current) drug therapy: Secondary | ICD-10-CM

## 2014-10-15 NOTE — Telephone Encounter (Signed)
-----   Message from Jodelle Gross, NP sent at 10/15/2014  7:09 AM EDT ----- Kidney function significantly impacted by over use of diuretics and metolazone.  She will need to take lasix as directed and do not take metolazone unless absolutely necessary. AVOID salted pre-packed dinners. Repeat BMET in one week after only taking medications as directed.

## 2014-10-15 NOTE — Telephone Encounter (Signed)
LM on pt's private vm with Ms lawrence's advise.this had been discussed already at recent office visit,lab slip entered

## 2014-10-17 ENCOUNTER — Telehealth: Payer: Self-pay

## 2014-10-17 NOTE — Telephone Encounter (Signed)
Attempted patient call for ICM enrollment and intro.  Patient referred by Trudi Ida, CMA at last device check 10/11/2014.  Left message for return call.

## 2014-10-17 NOTE — Telephone Encounter (Signed)
Spoke with patient and provided ICM enrollment intro.  She agreed to monthly ICM follow up to check device fluid levels.  1st ICM transmission date 11/19/2014.  Enrollment letter sent.

## 2014-10-30 LAB — BASIC METABOLIC PANEL
BUN: 26 mg/dL — ABNORMAL HIGH (ref 7–25)
CALCIUM: 9.1 mg/dL (ref 8.6–10.4)
CO2: 25 mmol/L (ref 20–31)
Chloride: 108 mmol/L (ref 98–110)
Creat: 1.37 mg/dL — ABNORMAL HIGH (ref 0.50–1.05)
GLUCOSE: 124 mg/dL — AB (ref 65–99)
POTASSIUM: 4.2 mmol/L (ref 3.5–5.3)
SODIUM: 141 mmol/L (ref 135–146)

## 2014-11-14 ENCOUNTER — Ambulatory Visit (INDEPENDENT_AMBULATORY_CARE_PROVIDER_SITE_OTHER): Payer: Medicaid Other | Admitting: "Endocrinology

## 2014-11-14 ENCOUNTER — Encounter: Payer: Self-pay | Admitting: "Endocrinology

## 2014-11-14 VITALS — BP 125/84 | HR 80 | Ht 70.0 in | Wt 287.0 lb

## 2014-11-14 DIAGNOSIS — N183 Chronic kidney disease, stage 3 unspecified: Secondary | ICD-10-CM

## 2014-11-14 DIAGNOSIS — Z9119 Patient's noncompliance with other medical treatment and regimen: Secondary | ICD-10-CM | POA: Diagnosis not present

## 2014-11-14 DIAGNOSIS — E1122 Type 2 diabetes mellitus with diabetic chronic kidney disease: Secondary | ICD-10-CM

## 2014-11-14 DIAGNOSIS — Z91199 Patient's noncompliance with other medical treatment and regimen due to unspecified reason: Secondary | ICD-10-CM

## 2014-11-14 DIAGNOSIS — I10 Essential (primary) hypertension: Secondary | ICD-10-CM | POA: Diagnosis not present

## 2014-11-14 DIAGNOSIS — E785 Hyperlipidemia, unspecified: Secondary | ICD-10-CM

## 2014-11-14 MED ORDER — GLIMEPIRIDE 2 MG PO TABS: 2.0000 mg | ORAL_TABLET | Freq: Every day | ORAL | Status: DC

## 2014-11-14 NOTE — Patient Instructions (Signed)

## 2014-11-14 NOTE — Progress Notes (Signed)
Subjective:    Patient ID: Katherine [name], female    DOB: 1960-08-05,    Past Medical History  Diagnosis Date  . Angina   . Asthma   . Shortness of breath   . Bronchitis   . Lupus (HCC)   . Seizures (HCC)     per patient statement  . Hypertension   . Stroke (HCC)   . CHF (congestive heart failure) (HCC)   . Pneumonia   . GERD (gastroesophageal reflux disease)   . Headache(784.0)   . Diabetes mellitus   . Fibromyalgia   . Nonischemic cardiomyopathy (HCC)     NYHA classII  . Fibromyalgia 10/03/2012   Past Surgical History  Procedure Laterality Date  . Icd  02/13/2011    Medtronic Protecta DR XT  . Tubal ligation    . Right ankle    . US echocardiography  11/09/2010    mild LV enlargement w/conc. LVH & severe global hypokinesis EF 30-35%,mod. diastolic dysfunction, mod. TR,mild AI,mild to mod MR,mild PI  . Left heart catheterization with coronary angiogram N/A 12/05/2010    Procedure: LEFT HEART CATHETERIZATION WITH CORONARY ANGIOGRAM;  Surgeon: Chrystie Nose, MD;  Location: [facility] CATH LAB;  Service: Cardiovascular;  Laterality: N/A;  . Implantable cardioverter defibrillator implant N/A 02/13/2011    Procedure: IMPLANTABLE CARDIOVERTER DEFIBRILLATOR IMPLANT;  Surgeon: Thurmon Fair, MD;  Location: [facility] CATH LAB;  Service: Cardiovascular;  Laterality: N/A;   Social History   Social History  . Marital Status: Legally Separated    Spouse Name: N/A  . Number of Children: N/A  . Years of Education: N/A   Social History Main Topics  . Smoking status: Former Smoker -- 1.00 packs/day    Types: Cigarettes    Quit date: 10/03/2005  . Smokeless tobacco: Never Used  . Alcohol Use: 0.0 oz/week    0 Standard drinks or equivalent per week     Comment: quit 2007  . Drug Use: No  . Sexual Activity: Yes   Other Topics Concern  . None   Social History Narrative   Outpatient Encounter Prescriptions as of 11/14/2014  Medication Sig  . albuterol (PROVENTIL HFA;VENTOLIN  HFA) 108 (90 BASE) MCG/ACT inhaler Inhale 2 puffs into the lungs every 6 (six) hours as needed. For wheezing and shortness of breath  . albuterol (PROVENTIL) (2.5 MG/3ML) 0.083% nebulizer solution Take 3 mLs (2.5 mg total) by nebulization every 6 (six) hours as needed. For wheezing and shortness of breath  . aspirin EC 81 MG tablet Take 81 mg by mouth daily.    . cetirizine (ZYRTEC) 10 MG tablet Take 10 mg by mouth as needed for allergies.  . cloNIDine (CATAPRES) 0.2 MG tablet Take 1 tablet (0.2 mg total) by mouth 2 (two) times daily.  . Fluticasone-Salmeterol (ADVAIR) 500-50 MCG/DOSE AEPB Inhale 2 puffs into the lungs 2 (two) times daily.  . furosemide (LASIX) 40 MG tablet Take 1 tablet (40 mg total) by mouth 2 (two) times daily. Will take 1 extra dose based on edema  . gabapentin (NEURONTIN) 300 MG capsule Take 300 mg by mouth 3 (three) times daily.    . hydroxychloroquine (PLAQUENIL) 200 MG tablet Take 200 mg by mouth 2 (two) times daily.    Marland Kitchen ibuprofen (ADVIL,MOTRIN) 600 MG tablet Take 600 mg by mouth every 8 (eight) hours as needed.  . loratadine (CLARITIN) 10 MG tablet Take 10 mg by mouth daily as needed. For allergies   . losartan (COZAAR) 100 MG tablet  Take 1 tablet (100 mg total) by mouth daily.  . meloxicam (MOBIC) 7.5 MG tablet Take 7.5 mg by mouth 2 (two) times daily as needed for pain.  . metolazone (ZAROXOLYN) 5 MG tablet Take 1 tablet (5 mg total) by mouth every other day. Take Monday, Wednesday, Friday  . metoprolol succinate (TOPROL-XL) 100 MG 24 hr tablet Take 1 tablet (100 mg total) by mouth daily. Take with or immediately following a meal.  . omeprazole (PRILOSEC) 20 MG capsule Take 1 capsule (20 mg total) by mouth 2 (two) times daily.  . promethazine (PHENERGAN) 25 MG tablet Take 25 mg by mouth every 8 (eight) hours as needed. nausea   . simvastatin (ZOCOR) 20 MG tablet Take 1 tablet (20 mg total) by mouth at bedtime.  Marland Kitchen spironolactone (ALDACTONE) 100 MG tablet Take 100 mg by  mouth daily.  Marland Kitchen venlafaxine XR (EFFEXOR-XR) 150 MG 24 hr capsule Take 450 mg by mouth daily.  . [DISCONTINUED] Insulin Aspart (NOVOLOG FLEXPEN [place]) Inject into the skin 3 (three) times daily. SSI  . [DISCONTINUED] insulin detemir (LEVEMIR) 100 UNIT/ML injection Inject 70 Units into the skin at bedtime.   Marland Kitchen glimepiride (AMARYL) 2 MG tablet Take 1 tablet (2 mg total) by mouth daily with breakfast.  . [DISCONTINUED] glimepiride (AMARYL) 2 MG tablet Take 1 tablet (2 mg total) by mouth daily before breakfast.   No facility-administered encounter medications on file as of 11/14/2014.   ALLERGIES: Allergies  Allergen Reactions  . Penicillins Itching    Can take amoxicillin without a reaction  . Shellfish Allergy Other (See Comments)    Causes to have grand mal seizures   VACCINATION STATUS:  There is no immunization history on file for this patient.  Diabetes She presents for her follow-up diabetic visit. She has type 2 diabetes mellitus. Onset time: She was diagnosed at approximate age of 62 years. Her disease course has been worsening. Pertinent negatives for hypoglycemia include no confusion, headaches, pallor or seizures. Associated symptoms include fatigue, polydipsia and polyuria. Pertinent negatives for diabetes include no chest pain and no polyphagia. There are no hypoglycemic complications. Symptoms are worsening. Diabetic complications include a CVA and nephropathy. Risk factors for coronary artery disease include diabetes mellitus, dyslipidemia, obesity, sedentary lifestyle, post-menopausal and hypertension. She is compliant with treatment none of the time. She is following a generally unhealthy diet. She has not had a previous visit with a dietitian. She never participates in exercise. Home blood sugar record trend: She brought her log showing questionable blood glucose readings or between 150 - 160, clearly unfortunately made up numbers. (She did not bring any meter to verify her BG  profile.) An ACE inhibitor/angiotensin II receptor blocker is being taken.  Hypertension This is a chronic problem. The current episode started more than 1 year ago. The problem is controlled. Pertinent negatives include no chest pain, headaches, palpitations or shortness of breath. Risk factors for coronary artery disease include dyslipidemia, diabetes mellitus, obesity and sedentary lifestyle. Past treatments include angiotensin blockers. Hypertensive end-organ damage includes kidney disease and CVA.  Hyperlipidemia This is a chronic problem. The current episode started more than 1 year ago. Pertinent negatives include no chest pain, myalgias or shortness of breath. Current antihyperlipidemic treatment includes statins.     Review of Systems  Constitutional: Positive for fatigue. Negative for unexpected weight change.  HENT: Negative for trouble swallowing and voice change.   Eyes: Negative for visual disturbance.  Respiratory: Negative for cough, shortness of breath and wheezing.  Cardiovascular: Negative for chest pain, palpitations and leg swelling.  Gastrointestinal: Negative for nausea, vomiting and diarrhea.  Endocrine: Positive for polydipsia and polyuria. Negative for cold intolerance, heat intolerance and polyphagia.  Musculoskeletal: Negative for myalgias and arthralgias.  Skin: Negative for color change, pallor, rash and wound.  Neurological: Negative for seizures and headaches.  Psychiatric/Behavioral: Negative for suicidal ideas and confusion.    Objective:    BP 125/84 mmHg  Pulse 80  Ht 5\' 10"  (1.778 m)  Wt 287 lb (130.182 kg)  BMI 41.18 kg/m2  SpO2 97%  Wt Readings from Last 3 Encounters:  11/14/14 287 lb (130.182 kg)  10/12/14 293 lb (132.904 kg)  06/22/14 292 lb (132.45 kg)    Physical Exam  Constitutional: She is oriented to person, place, and time. She appears well-developed.  HENT:  Head: Normocephalic and atraumatic.  Eyes: EOM are normal.  Neck:  Normal range of motion. Neck supple. No tracheal deviation present. No thyromegaly present.  Cardiovascular: Normal rate and regular rhythm.   Pulmonary/Chest: Effort normal and breath sounds normal.  Abdominal: Soft. Bowel sounds are normal. There is no tenderness. There is no guarding.  Musculoskeletal: Normal range of motion. She exhibits no edema.  Neurological: She is alert and oriented to person, place, and time. She has normal reflexes. No cranial nerve deficit. Coordination normal.  Skin: Skin is warm and dry. No rash noted. No erythema. No pallor.  Psychiatric: She has a normal mood and affect. Judgment normal.  She has reluctant noncompliant affect.    Results for orders placed or performed in visit on 11/14/14  Hemoglobin A1c  Result Value Ref Range   Hgb A1c MFr Bld 11.4 (A) 4.0 - 6.0 %   Complete Blood Count (Most recent): No results found for: WBC, HGB, HCT, MCV, PLT Chemistry (most recent): Lab Results  Component Value Date   NA 141 10/29/2014   K 4.2 10/29/2014   CL 108 10/29/2014   CO2 25 10/29/2014   BUN 26* 10/29/2014   CREATININE 1.37* 10/29/2014   Diabetic Labs (most recent): Lab Results  Component Value Date   HGBA1C 11.4* 09/26/2014   Lipid profile (most recent): No results found for: TRIG, CHOL       Assessment & Plan:   1. Diabetes mellitus with stage 3 chronic kidney disease (HCC)   Her  diabetes is  complicated by chronic kidney disease and questionable CVA and patient remains at a high risk for more acute and chronic complications of diabetes which include CAD, CVA, CKD, retinopathy, and neuropathy. These are all discussed in detail with the patient.  Patient came with made up glucose profile between 150 and 160, and  recent A1c of 11.4 %.  She did not bring any meter to verify her blood glucose readings. Recent labs reviewed.   - I have re-counseled the patient on diet management and weight loss  by adopting a carbohydrate restricted /  protein rich  Diet.  - Suggestion is made for patient to avoid simple carbohydrates   from their diet including Cakes , Desserts, Ice Cream,  Soda (  diet and regular) , Sweet Tea , Candies,  Chips, Cookies, Artificial Sweeteners,   and "Sugar-free" Products .  This will help patient to have stable blood glucose profile and potentially avoid unintended  Weight gain.  - Patient is advised to stick to a routine mealtimes to eat 3 meals  a day and avoid unnecessary snacks ( to snack only to correct hypoglycemia).  -  The patient  has been  scheduled with Norm Salt, RDN, CDE for individualized DM education.  - I have approached patient with the following individualized plan to manage diabetes and patient agrees.  -Patient is seriously noncompliant. - ideally , she would  definitely need basal/bolus insulin therapy for optimal control of diabetes. -However, she did not display appropriate commitment for monitoring. -Hence she is at high risk patient to give insulin to. - she must start with strict monitoring of BG AC and HS.  -Patient is encouraged to call clinic for blood glucose levels less than 70 or above 300 mg /dl. -She is not a candidate for Invokana, metformin due to advanced CKD. -I will initiate Amaryl 2 mg by mouth every morning for now. She will be considered for incretin therapy next visit, along with insulin therapy . -Unfortunately, she does not seem to be convinced about the need to control diabetes.   - Patient specific target  for A1c; LDL, HDL, Triglycerides, and  Waist Circumference were discussed in detail.  2) BP/HTN: Controlled. Continue current medications including ACEI/ARB. 3) Lipids/HPL:  continue statins. 4)  Weight/Diet: CDE consult in progress, exercise, and carbohydrates information provided.  5)  Personal history of noncompliance with medical treatment, presenting hazards to health -I counseled this patient for 20 minutes.  6) Chronic Care/Health  Maintenance:  -Patient  on ACEI/ARB and Statin medications and encouraged to continue to follow up with Ophthalmology, Podiatrist at least yearly or according to recommendations, and advised to  stay away from smoking. I have recommended yearly flu vaccine and pneumonia vaccination at least every 5 years; moderate intensity exercise for up to 150 minutes weekly; and  sleep for at least 7 hours a day.  I advised patient to maintain close follow up with their PCP for primary care needs.  Patient is asked to bring meter and  blood glucose logs during their next visit.   Follow up plan: Return in about 1 week (around 11/21/2014) for diabetes.  Marquis Lunch, MD Phone: [phone]  Fax: [phone]   11/14/2014, 2:02 PM

## 2014-11-15 ENCOUNTER — Ambulatory Visit: Payer: Self-pay | Admitting: "Endocrinology

## 2014-11-16 ENCOUNTER — Encounter: Payer: Self-pay | Admitting: Adult Health

## 2014-11-16 ENCOUNTER — Ambulatory Visit (INDEPENDENT_AMBULATORY_CARE_PROVIDER_SITE_OTHER): Payer: Medicaid Other | Admitting: Adult Health

## 2014-11-16 VITALS — BP 126/74 | HR 87 | Wt 289.6 lb

## 2014-11-16 DIAGNOSIS — I10 Essential (primary) hypertension: Secondary | ICD-10-CM

## 2014-11-16 MED ORDER — FLUTICASONE-SALMETEROL 500-50 MCG/DOSE IN AEPB: 2.0000 | INHALATION_SPRAY | Freq: Two times a day (BID) | RESPIRATORY_TRACT | Status: DC

## 2014-11-16 NOTE — Progress Notes (Deleted)
Name: Katherine [name]    DOB: [birth year]  Age: 54 y.o.  MR#: [id number]       PCP:  Alleen Borne      Insurance: Payor: MEDICAID [place] / Plan: MEDICAID [place] ACCESS / Product Type: *No Product type* /   CC:   No chief complaint on file.   VS Filed Vitals:   11/16/14 1329  BP: 126/74  Pulse: 87  Weight: 289 lb 9.6 oz (131.362 kg)  SpO2: 97%    Weights Current Weight  11/16/14 289 lb 9.6 oz (131.362 kg)  11/14/14 287 lb (130.182 kg)  10/12/14 293 lb (132.904 kg)    Blood Pressure  BP Readings from Last 3 Encounters:  11/16/14 126/74  11/14/14 125/84  10/12/14 94/62     Admit date:  (Not on file) Last encounter with RMR:  10/12/2014   Allergy Penicillins and Shellfish allergy  Current Outpatient Prescriptions  Medication Sig Dispense Refill  . albuterol (PROVENTIL HFA;VENTOLIN HFA) 108 (90 BASE) MCG/ACT inhaler Inhale 2 puffs into the lungs every 6 (six) hours as needed. For wheezing and shortness of breath    . albuterol (PROVENTIL) (2.5 MG/3ML) 0.083% nebulizer solution Take 3 mLs (2.5 mg total) by nebulization every 6 (six) hours as needed. For wheezing and shortness of breath 75 mL 3  . aspirin EC 81 MG tablet Take 81 mg by mouth daily.      . cetirizine (ZYRTEC) 10 MG tablet Take 10 mg by mouth as needed for allergies.    . cloNIDine (CATAPRES) 0.2 MG tablet Take 1 tablet (0.2 mg total) by mouth 2 (two) times daily. 60 tablet 6  . Fluticasone-Salmeterol (ADVAIR) 500-50 MCG/DOSE AEPB Inhale 2 puffs into the lungs 2 (two) times daily.    . furosemide (LASIX) 40 MG tablet Take 1 tablet (40 mg total) by mouth 2 (two) times daily. Will take 1 extra dose based on edema 90 tablet 2  . gabapentin (NEURONTIN) 300 MG capsule Take 300 mg by mouth 3 (three) times daily.      Marland Kitchen glimepiride (AMARYL) 2 MG tablet Take 1 tablet (2 mg total) by mouth daily with breakfast. 30 tablet 3  . hydroxychloroquine (PLAQUENIL) 200 MG tablet Take 200 mg by mouth 2 (two) times daily.      Marland Kitchen  ibuprofen (ADVIL,MOTRIN) 600 MG tablet Take 600 mg by mouth every 8 (eight) hours as needed.    . loratadine (CLARITIN) 10 MG tablet Take 10 mg by mouth daily as needed. For allergies     . losartan (COZAAR) 100 MG tablet Take 1 tablet (100 mg total) by mouth daily. 90 tablet 3  . meloxicam (MOBIC) 7.5 MG tablet Take 7.5 mg by mouth 2 (two) times daily as needed for pain.    . metolazone (ZAROXOLYN) 5 MG tablet Take 1 tablet (5 mg total) by mouth every other day. Take Monday, Wednesday, Friday 30 tablet 9  . metoprolol succinate (TOPROL-XL) 100 MG 24 hr tablet Take 1 tablet (100 mg total) by mouth daily. Take with or immediately following a meal. 90 tablet 3  . omeprazole (PRILOSEC) 20 MG capsule Take 1 capsule (20 mg total) by mouth 2 (two) times daily. 180 capsule 3  . promethazine (PHENERGAN) 25 MG tablet Take 25 mg by mouth every 8 (eight) hours as needed. nausea     . simvastatin (ZOCOR) 20 MG tablet Take 1 tablet (20 mg total) by mouth at bedtime. 30 tablet 9  . spironolactone (ALDACTONE) 100 MG tablet Take  100 mg by mouth daily.    Marland Kitchen venlafaxine XR (EFFEXOR-XR) 150 MG 24 hr capsule Take 450 mg by mouth daily.     No current facility-administered medications for this visit.    Discontinued Meds:   There are no discontinued medications.  Patient Active Problem List   Diagnosis Date Noted  . Acute on chronic systolic congestive heart failure, NYHA class 2 (HCC) 11/17/2012  . Non-ischemic cardiomyopathy (HCC) 10/03/2012  . AICD (automatic cardioverter/defibrillator) present 10/03/2012  . HTN (hypertension) 10/03/2012  . Diabetes mellitus with stage 3 chronic kidney disease (HCC) 10/03/2012  . Fibromyalgia 10/03/2012  . Dyslipidemia 10/03/2012  . Lupus (HCC) 10/03/2012  . Stroke (HCC) 10/03/2012    LABS    Component Value Date/Time   NA 141 10/29/2014 1326   NA 137 10/12/2014 1638   NA 140 10/03/2012 1355   K 4.2 10/29/2014 1326   K 4.3 10/12/2014 1638   K 3.9 10/03/2012 1355    CL 108 10/29/2014 1326   CL 103 10/12/2014 1638   CL 103 10/03/2012 1355   CO2 25 10/29/2014 1326   CO2 26 10/12/2014 1638   CO2 29 10/03/2012 1355   GLUCOSE 124* 10/29/2014 1326   GLUCOSE 101* 10/12/2014 1638   GLUCOSE 127* 10/03/2012 1355   BUN 26* 10/29/2014 1326   BUN 52* 10/12/2014 1638   BUN 17 10/03/2012 1355   CREATININE 1.37* 10/29/2014 1326   CREATININE 2.67* 10/12/2014 1638   CREATININE 1.10 10/03/2012 1355   CREATININE 0.86 12/25/2010 1004   CALCIUM 9.1 10/29/2014 1326   CALCIUM 9.0 10/12/2014 1638   CALCIUM 9.2 10/03/2012 1355   GFRNONAA 77* 12/25/2010 1004   GFRAA 90* 12/25/2010 1004   CMP     Component Value Date/Time   NA 141 10/29/2014 1326   K 4.2 10/29/2014 1326   CL 108 10/29/2014 1326   CO2 25 10/29/2014 1326   GLUCOSE 124* 10/29/2014 1326   BUN 26* 10/29/2014 1326   CREATININE 1.37* 10/29/2014 1326   CREATININE 0.86 12/25/2010 1004   CALCIUM 9.1 10/29/2014 1326   PROT 7.4 10/03/2012 1355   ALBUMIN 3.9 10/03/2012 1355   AST 15 10/03/2012 1355   ALT 12 10/03/2012 1355   ALKPHOS 102 10/03/2012 1355   BILITOT 0.6 10/03/2012 1355   GFRNONAA 77* 12/25/2010 1004   GFRAA 90* 12/25/2010 1004    No results found for: WBC, HGB, HCT, MCV  Lipid Panel  No results found for: CHOL, TRIG, HDL, CHOLHDL, VLDL, LDLCALC, LDLDIRECT  ABG No results found for: PHART, PCO2ART, PO2ART, HCO3, TCO2, ACIDBASEDEF, O2SAT   No results found for: TSH BNP (last 3 results) No results for input(s): BNP in the last 8760 hours.  ProBNP (last 3 results) No results for input(s): PROBNP in the last 8760 hours.  Cardiac Panel (last 3 results) No results for input(s): CKTOTAL, CKMB, TROPONINI, RELINDX in the last 72 hours.  Iron/TIBC/Ferritin/ %Sat No results found for: IRON, TIBC, FERRITIN, IRONPCTSAT   EKG Orders placed or performed in visit on 11/16/14  . EKG 12-Lead     Prior Assessment and Plan Problem List as of 11/16/2014      Cardiovascular and  Mediastinum   Non-ischemic cardiomyopathy (HCC)   HTN (hypertension)   Last Assessment & Plan 06/22/2014 Office Visit Written 06/22/2014 10:38 AM by Marinus Maw, MD    Her blood pressure is well controlled today. No change in meds. She is encouraged to lose weight.      Stroke [facility])  Acute on chronic systolic congestive heart failure, NYHA class 2 [facility])   Last Assessment & Plan 06/22/2014 Office Visit Written 06/22/2014 10:37 AM by Marinus Maw, MD    Her symptoms are well compensated. She will continue her current meds. I have encouraged her to reduce her sodium intake, lose weight and continue her current meds.        Endocrine   Diabetes mellitus with stage 3 chronic kidney disease (HCC)     Musculoskeletal and Integument   Fibromyalgia     Other   AICD (automatic cardioverter/defibrillator) present   Last Assessment & Plan 06/22/2014 Office Visit Written 06/22/2014 10:39 AM by Marinus Maw, MD    Her medtronic ICD is working normally. Will recheck in several months. She has had NSVT.      Dyslipidemia   Lupus (HCC)       Imaging: No results found.

## 2014-11-16 NOTE — Patient Instructions (Signed)
Your physician wants you to follow-up in: 6 Months with Dr. Koneswaran. You will receive a reminder letter in the mail two months in advance. If you don't receive a letter, please call our office to schedule the follow-up appointment.  Your physician recommends that you continue on your current medications as directed. Please refer to the Current Medication list given to you today.  If you need a refill on your cardiac medications before your next appointment, please call your pharmacy.  Thank you for choosing [facility]!   

## 2014-11-16 NOTE — Progress Notes (Signed)
Cardiology Office Note   Date:  11/16/2014   ID:  ALIS [name], DOB [birth year], MRN [id number]  PCP:  Alleen Borne  Cardiologist: Inis Sizer, NP   No chief complaint on file.     History of Present Illness: Katherine [name] is a 54 y.o. female who presents for of nonischemic cardiomyopathy EF of 28%,, chronic systolic heart failure, with a history of hypertension, SLE, fibromyalgia, and obesity. The patient has then treated by [name] in the setting of ICD implantation. She was last seen by [name] in June of 2016. On last office visit on 10/12/2014 she was having complaints of fluid retention and non-adherence to low sodium diet. She took extra doses of metolazone. She was weak and dizzy. A BMET was ordered and she was asked to only use metolazone prn, and go back to lasix 40 mg BID after two days of rest from it.   BMET revealed Creatinine of 2.67 with follow up labs on 10/29/14 with recovery of Creatinine to 1.37. She is feeling better. Has become more active but gets tired if she does too much. Will rest she feels better. She has not taken any metolazone.   Past Medical History  Diagnosis Date  . Angina   . Asthma   . Shortness of breath   . Bronchitis   . Lupus (HCC)   . Seizures (HCC)     per patient statement  . Hypertension   . Stroke (HCC)   . CHF (congestive heart failure) (HCC)   . Pneumonia   . GERD (gastroesophageal reflux disease)   . Headache(784.0)   . Diabetes mellitus   . Fibromyalgia   . Nonischemic cardiomyopathy (HCC)     NYHA classII  . Fibromyalgia 10/03/2012    Past Surgical History  Procedure Laterality Date  . Icd  02/13/2011    Medtronic Protecta DR XT  . Tubal ligation    . Right ankle    . US echocardiography  11/09/2010    mild LV enlargement w/conc. LVH & severe global hypokinesis EF 30-35%,mod. diastolic dysfunction, mod. TR,mild AI,mild to mod MR,mild PI  . Left heart catheterization with coronary  angiogram N/A 12/05/2010    Procedure: LEFT HEART CATHETERIZATION WITH CORONARY ANGIOGRAM;  Surgeon: Chrystie Nose, MD;  Location: [facility] CATH LAB;  Service: Cardiovascular;  Laterality: N/A;  . Implantable cardioverter defibrillator implant N/A 02/13/2011    Procedure: IMPLANTABLE CARDIOVERTER DEFIBRILLATOR IMPLANT;  Surgeon: Thurmon Fair, MD;  Location: [facility] CATH LAB;  Service: Cardiovascular;  Laterality: N/A;     Current Outpatient Prescriptions  Medication Sig Dispense Refill  . albuterol (PROVENTIL HFA;VENTOLIN HFA) 108 (90 BASE) MCG/ACT inhaler Inhale 2 puffs into the lungs every 6 (six) hours as needed. For wheezing and shortness of breath    . albuterol (PROVENTIL) (2.5 MG/3ML) 0.083% nebulizer solution Take 3 mLs (2.5 mg total) by nebulization every 6 (six) hours as needed. For wheezing and shortness of breath 75 mL 3  . aspirin EC 81 MG tablet Take 81 mg by mouth daily.      . cetirizine (ZYRTEC) 10 MG tablet Take 10 mg by mouth as needed for allergies.    . cloNIDine (CATAPRES) 0.2 MG tablet Take 1 tablet (0.2 mg total) by mouth 2 (two) times daily. 60 tablet 6  . Fluticasone-Salmeterol (ADVAIR) 500-50 MCG/DOSE AEPB Inhale 2 puffs into the lungs 2 (two) times daily. 60 each 0  . furosemide (LASIX) 40 MG tablet Take 1 tablet (40 mg  total) by mouth 2 (two) times daily. Will take 1 extra dose based on edema 90 tablet 2  . gabapentin (NEURONTIN) 300 MG capsule Take 300 mg by mouth 3 (three) times daily.      Marland Kitchen glimepiride (AMARYL) 2 MG tablet Take 1 tablet (2 mg total) by mouth daily with breakfast. 30 tablet 3  . hydroxychloroquine (PLAQUENIL) 200 MG tablet Take 200 mg by mouth 2 (two) times daily.      Marland Kitchen ibuprofen (ADVIL,MOTRIN) 600 MG tablet Take 600 mg by mouth every 8 (eight) hours as needed.    . loratadine (CLARITIN) 10 MG tablet Take 10 mg by mouth daily as needed. For allergies     . losartan (COZAAR) 100 MG tablet Take 1 tablet (100 mg total) by mouth daily. 90 tablet 3  .  meloxicam (MOBIC) 7.5 MG tablet Take 7.5 mg by mouth 2 (two) times daily as needed for pain.    . metolazone (ZAROXOLYN) 5 MG tablet Take 1 tablet (5 mg total) by mouth every other day. Take Monday, Wednesday, Friday 30 tablet 9  . metoprolol succinate (TOPROL-XL) 100 MG 24 hr tablet Take 1 tablet (100 mg total) by mouth daily. Take with or immediately following a meal. 90 tablet 3  . omeprazole (PRILOSEC) 20 MG capsule Take 1 capsule (20 mg total) by mouth 2 (two) times daily. 180 capsule 3  . promethazine (PHENERGAN) 25 MG tablet Take 25 mg by mouth every 8 (eight) hours as needed. nausea     . simvastatin (ZOCOR) 20 MG tablet Take 1 tablet (20 mg total) by mouth at bedtime. 30 tablet 9  . spironolactone (ALDACTONE) 100 MG tablet Take 100 mg by mouth daily.    Marland Kitchen venlafaxine XR (EFFEXOR-XR) 150 MG 24 hr capsule Take 450 mg by mouth daily.     No current facility-administered medications for this visit.    Allergies:   Penicillins and Shellfish allergy    Social History:  The patient  reports that she quit smoking about 9 years ago. Her smoking use included Cigarettes. She smoked 1.00 pack per day. She has never used smokeless tobacco. She reports that she drinks alcohol. She reports that she does not use illicit drugs.   Family History:  The patient's family history is not on file.    ROS: All other systems are reviewed and negative. Unless otherwise mentioned in H&P    PHYSICAL EXAM: VS:  BP 126/74 mmHg  Pulse 87  Wt 289 lb 9.6 oz (131.362 kg)  SpO2 97% , BMI Body mass index is 41.55 kg/(m^2). GEN: Well nourished, well developed, in no acute distressObese.  HEENT: normal Neck: no JVD, carotid bruits, or masses Cardiac: RRR; distant heart sounds, no murmurs, rubs, or gallops,no edema  Respiratory:  Clear to auscultation bilaterally, normal work of breathing GI: soft, nontender, nondistended, + BS MS: no deformity or atrophy Skin: warm and dry, no rash Neuro:  Strength and  sensation are intact Psych: euthymic mood, full affect   EKG: The ekg ordered today demonstrates NSR with low voltage. RAte of 89 bpm.    Recent Labs: 10/29/2014: BUN 26*; Creat 1.37*; Potassium 4.2; Sodium 141    Lipid Panel No results found for: CHOL, TRIG, HDL, CHOLHDL, VLDL, LDLCALC, LDLDIRECT    Wt Readings from Last 3 Encounters:  11/16/14 289 lb 9.6 oz (131.362 kg)  11/14/14 287 lb (130.182 kg)  10/12/14 293 lb (132.904 kg)      ASSESSMENT AND PLAN:  1. NICM: She  is medically compliant and is strict on salt intake. She does not appear to be fluid overloaded. Continue current medications. She has had labs within the last two weeks per PCP. No changes in regimen.   2. Chronic Systolic CHF: Class II NYHA dyspnea. She is doing well with the exception of needing to use breathing treatments more with chronic lung disease. Continue with PCP for this.   3. AICD in situ: Keep follow up appointments with PPM clinic.   4. Hypertension: Controlled on clonidine, losartan, spironolactone. Labs have been reivewed.      Current medicines are reviewed at length with the patient today.    Labs/ tests ordered today include: None   Orders Placed This Encounter  Procedures  . EKG 12-Lead     Disposition:   FU with 6 months.    Signed, Joni Reining, NP  11/16/2014 2:10 PM    [facility] [address] [place], [place] [address] Phone: [phone]; Fax: [phone]

## 2014-11-21 ENCOUNTER — Other Ambulatory Visit: Payer: Self-pay | Admitting: Cardiovascular Disease

## 2014-11-21 MED ORDER — SPIRONOLACTONE 100 MG PO TABS: 100.0000 mg | ORAL_TABLET | Freq: Every day | ORAL | Status: DC

## 2014-11-23 NOTE — Progress Notes (Signed)
Rescheduled ICM transmission for 12/26/2014 as no transmission received for 1st encounter.  Letter sent.

## 2014-11-27 ENCOUNTER — Ambulatory Visit: Payer: Medicaid Other | Admitting: "Endocrinology

## 2014-12-17 ENCOUNTER — Ambulatory Visit: Payer: Medicaid Other | Admitting: "Endocrinology

## 2014-12-19 ENCOUNTER — Ambulatory Visit: Payer: Medicaid Other | Admitting: "Endocrinology

## 2014-12-26 ENCOUNTER — Telehealth: Payer: Self-pay | Admitting: Cardiology

## 2014-12-26 NOTE — Telephone Encounter (Signed)
Spoke with pt and reminded pt of remote transmission that is due today. Pt verbalized understanding.   

## 2014-12-28 ENCOUNTER — Ambulatory Visit: Payer: Medicaid Other | Admitting: "Endocrinology

## 2014-12-31 ENCOUNTER — Telehealth: Payer: Self-pay

## 2014-12-31 NOTE — Telephone Encounter (Signed)
Attempted ICM call to patient to check if assistance is needed in sending remote transmission for ICM clinic.  No answer and message left for return call.     No ICM transmission received for 12/26/2014.  Patient enrolled in ICM on 10/17/2014 but no transmissions have been received after receiving reminder calls.  Next transmission scheduled for 01/17/2015 which will be 1st ICM encounter if patient is reached.  Patient letter sent with new transmission date and to call if she needs assistance sending remote transmission.

## 2015-01-08 ENCOUNTER — Ambulatory Visit: Payer: Medicaid Other | Admitting: "Endocrinology

## 2015-01-17 ENCOUNTER — Telehealth: Payer: Self-pay | Admitting: Cardiology

## 2015-01-17 NOTE — Telephone Encounter (Signed)
Spoke with pt and reminded pt of remote transmission that is due today. Pt verbalized understanding.   

## 2015-01-24 NOTE — Progress Notes (Signed)
No ICM transmission received for 01/17/2015.  Unable to reach patient for ICM clinic after enrolling on 10/17/2014.  No remote transmissions received after reminder calls and letters.  Patient currently not sending remote transmissions.  She is visiting office for remote checks.  No further remote ICM transmission scheduled at this time due unable to reach patient since 10/17/2014.  Marland Kitchen

## 2015-01-25 ENCOUNTER — Ambulatory Visit: Payer: Medicaid Other | Admitting: "Endocrinology

## 2015-02-11 ENCOUNTER — Ambulatory Visit: Payer: Medicaid Other | Admitting: "Endocrinology

## 2015-02-19 ENCOUNTER — Encounter: Payer: Self-pay | Admitting: "Endocrinology

## 2015-02-19 ENCOUNTER — Ambulatory Visit (INDEPENDENT_AMBULATORY_CARE_PROVIDER_SITE_OTHER): Payer: Medicaid Other | Admitting: "Endocrinology

## 2015-02-19 VITALS — BP 145/85 | HR 78 | Ht 70.0 in | Wt 274.0 lb

## 2015-02-19 DIAGNOSIS — E785 Hyperlipidemia, unspecified: Secondary | ICD-10-CM

## 2015-02-19 DIAGNOSIS — I10 Essential (primary) hypertension: Secondary | ICD-10-CM

## 2015-02-19 DIAGNOSIS — E1122 Type 2 diabetes mellitus with diabetic chronic kidney disease: Secondary | ICD-10-CM

## 2015-02-19 DIAGNOSIS — Z91199 Patient's noncompliance with other medical treatment and regimen due to unspecified reason: Secondary | ICD-10-CM | POA: Insufficient documentation

## 2015-02-19 DIAGNOSIS — N183 Chronic kidney disease, stage 3 (moderate): Secondary | ICD-10-CM | POA: Diagnosis not present

## 2015-02-19 DIAGNOSIS — Z9119 Patient's noncompliance with other medical treatment and regimen: Secondary | ICD-10-CM

## 2015-02-19 MED ORDER — GLIMEPIRIDE 4 MG PO TABS: 4.0000 mg | ORAL_TABLET | Freq: Every day | ORAL | Status: DC

## 2015-02-19 NOTE — Progress Notes (Signed)
Subjective:    Patient ID: Katherine [name], female    DOB: Jun 13, 1960,    Past Medical History  Diagnosis Date  . Angina   . Asthma   . Shortness of breath   . Bronchitis   . Lupus (HCC)   . Seizures (HCC)     per patient statement  . Hypertension   . Stroke (HCC)   . CHF (congestive heart failure) (HCC)   . Pneumonia   . GERD (gastroesophageal reflux disease)   . Headache(784.0)   . Diabetes mellitus   . Fibromyalgia   . Nonischemic cardiomyopathy (HCC)     NYHA classII  . Fibromyalgia 10/03/2012   Past Surgical History  Procedure Laterality Date  . Icd  02/13/2011    Medtronic Protecta DR XT  . Tubal ligation    . Right ankle    . US echocardiography  11/09/2010    mild LV enlargement w/conc. LVH & severe global hypokinesis EF 30-35%,mod. diastolic dysfunction, mod. TR,mild AI,mild to mod MR,mild PI  . Left heart catheterization with coronary angiogram N/A 12/05/2010    Procedure: LEFT HEART CATHETERIZATION WITH CORONARY ANGIOGRAM;  Surgeon: Chrystie Nose, MD;  Location: [facility] CATH LAB;  Service: Cardiovascular;  Laterality: N/A;  . Implantable cardioverter defibrillator implant N/A 02/13/2011    Procedure: IMPLANTABLE CARDIOVERTER DEFIBRILLATOR IMPLANT;  Surgeon: Thurmon Fair, MD;  Location: [facility] CATH LAB;  Service: Cardiovascular;  Laterality: N/A;   Social History   Social History  . Marital Status: Legally Separated    Spouse Name: N/A  . Number of Children: N/A  . Years of Education: N/A   Social History Main Topics  . Smoking status: Former Smoker -- 1.00 packs/day    Types: Cigarettes    Quit date: 10/03/2005  . Smokeless tobacco: Never Used  . Alcohol Use: 0.0 oz/week    0 Standard drinks or equivalent per week     Comment: quit 2007  . Drug Use: No  . Sexual Activity: Yes   Other Topics Concern  . None   Social History Narrative   Outpatient Encounter Prescriptions as of 02/19/2015  Medication Sig  . glimepiride (AMARYL) 4 MG tablet  Take 1 tablet (4 mg total) by mouth daily with breakfast.  . [DISCONTINUED] glimepiride (AMARYL) 2 MG tablet Take 1 tablet (2 mg total) by mouth daily with breakfast.  . albuterol (PROVENTIL HFA;VENTOLIN HFA) 108 (90 BASE) MCG/ACT inhaler Inhale 2 puffs into the lungs every 6 (six) hours as needed. For wheezing and shortness of breath  . albuterol (PROVENTIL) (2.5 MG/3ML) 0.083% nebulizer solution Take 3 mLs (2.5 mg total) by nebulization every 6 (six) hours as needed. For wheezing and shortness of breath  . aspirin EC 81 MG tablet Take 81 mg by mouth daily.    . cetirizine (ZYRTEC) 10 MG tablet Take 10 mg by mouth as needed for allergies.  . cloNIDine (CATAPRES) 0.2 MG tablet Take 1 tablet (0.2 mg total) by mouth 2 (two) times daily.  . Fluticasone-Salmeterol (ADVAIR) 500-50 MCG/DOSE AEPB Inhale 2 puffs into the lungs 2 (two) times daily.  . furosemide (LASIX) 40 MG tablet Take 1 tablet (40 mg total) by mouth 2 (two) times daily. Will take 1 extra dose based on edema  . gabapentin (NEURONTIN) 300 MG capsule Take 300 mg by mouth 3 (three) times daily.    . hydroxychloroquine (PLAQUENIL) 200 MG tablet Take 200 mg by mouth 2 (two) times daily.    Marland Kitchen ibuprofen (ADVIL,MOTRIN) 600 MG  tablet Take 600 mg by mouth every 8 (eight) hours as needed.  . loratadine (CLARITIN) 10 MG tablet Take 10 mg by mouth daily as needed. For allergies   . losartan (COZAAR) 100 MG tablet Take 1 tablet (100 mg total) by mouth daily.  . meloxicam (MOBIC) 7.5 MG tablet Take 7.5 mg by mouth 2 (two) times daily as needed for pain.  . metolazone (ZAROXOLYN) 5 MG tablet Take 1 tablet (5 mg total) by mouth every other day. Take Monday, Wednesday, Friday  . metoprolol succinate (TOPROL-XL) 100 MG 24 hr tablet Take 1 tablet (100 mg total) by mouth daily. Take with or immediately following a meal.  . omeprazole (PRILOSEC) 20 MG capsule Take 1 capsule (20 mg total) by mouth 2 (two) times daily.  . promethazine (PHENERGAN) 25 MG tablet  Take 25 mg by mouth every 8 (eight) hours as needed. nausea   . simvastatin (ZOCOR) 20 MG tablet Take 1 tablet (20 mg total) by mouth at bedtime.  Marland Kitchen spironolactone (ALDACTONE) 100 MG tablet Take 1 tablet (100 mg total) by mouth daily.  Marland Kitchen venlafaxine XR (EFFEXOR-XR) 150 MG 24 hr capsule Take 450 mg by mouth daily.   No facility-administered encounter medications on file as of 02/19/2015.   ALLERGIES: Allergies  Allergen Reactions  . Penicillins Itching    Can take amoxicillin without a reaction  . Shellfish Allergy Other (See Comments)    Causes to have grand mal seizures   VACCINATION STATUS:  There is no immunization history on file for this patient.  Diabetes She presents for her follow-up diabetic visit. She has type 2 diabetes mellitus. Onset time: She was diagnosed at approximate age of 57 years. Her disease course has been worsening. Pertinent negatives for hypoglycemia include no confusion, headaches, pallor or seizures. Associated symptoms include fatigue, polydipsia and polyuria. Pertinent negatives for diabetes include no chest pain and no polyphagia. There are no hypoglycemic complications. Symptoms are worsening. Diabetic complications include a CVA and nephropathy. Risk factors for coronary artery disease include diabetes mellitus, dyslipidemia, obesity, sedentary lifestyle, post-menopausal and hypertension. She is compliant with treatment none of the time. She is following a generally unhealthy diet. She has not had a previous visit with a dietitian. She never participates in exercise. Home blood sugar record trend: She brought her log showing questionable blood glucose readings or between 150 - 160, clearly unfortunately made up numbers. Her overall blood glucose range is >200 mg/dl. (She did not bring any meter to verify her BG profile. He has a log which has a random blood glucose tests showing readings in 300s and 400s.) An ACE inhibitor/angiotensin II receptor blocker is being  taken.  Hypertension This is a chronic problem. The current episode started more than 1 year ago. The problem is controlled. Pertinent negatives include no chest pain, headaches, palpitations or shortness of breath. Risk factors for coronary artery disease include dyslipidemia, diabetes mellitus, obesity and sedentary lifestyle. Past treatments include angiotensin blockers. Hypertensive end-organ damage includes kidney disease and CVA.  Hyperlipidemia This is a chronic problem. The current episode started more than 1 year ago. Pertinent negatives include no chest pain, myalgias or shortness of breath. Current antihyperlipidemic treatment includes statins.     Review of Systems  Constitutional: Positive for fatigue. Negative for unexpected weight change.  HENT: Negative for trouble swallowing and voice change.   Eyes: Negative for visual disturbance.  Respiratory: Negative for cough, shortness of breath and wheezing.   Cardiovascular: Negative for chest pain, palpitations  and leg swelling.  Gastrointestinal: Negative for nausea, vomiting and diarrhea.  Endocrine: Positive for polydipsia and polyuria. Negative for cold intolerance, heat intolerance and polyphagia.  Musculoskeletal: Negative for myalgias and arthralgias.  Skin: Negative for color change, pallor, rash and wound.  Neurological: Negative for seizures and headaches.  Psychiatric/Behavioral: Negative for suicidal ideas and confusion.    Objective:    BP 145/85 mmHg  Pulse 78  Ht  (1.778 m)  Wt 274 lb (124.286 kg)  BMI 39.32 kg/m2  SpO2 98%  Wt Readings from Last 3 Encounters:  02/19/15 274 lb (124.286 kg)  11/16/14 289 lb 9.6 oz (131.362 kg)  11/14/14 287 lb (130.182 kg)    Physical Exam  Constitutional: She is oriented to person, place, and time. She appears well-developed.  HENT:  Head: Normocephalic and atraumatic.  Eyes: EOM are normal.  Neck: Normal range of motion. Neck supple. No tracheal deviation  present. No thyromegaly present.  Cardiovascular: Normal rate and regular rhythm.   Pulmonary/Chest: Effort normal and breath sounds normal.  Abdominal: Soft. Bowel sounds are normal. There is no tenderness. There is no guarding.  Musculoskeletal: Normal range of motion. She exhibits no edema.  Neurological: She is alert and oriented to person, place, and time. She has normal reflexes. No cranial nerve deficit. Coordination normal.  Skin: Skin is warm and dry. No rash noted. No erythema. No pallor.  Psychiatric: She has a normal mood and affect. Judgment normal.  She has reluctant noncompliant affect.    Results for orders placed or performed in visit on 11/14/14  Hemoglobin A1c  Result Value Ref Range   Hgb A1c MFr Bld 11.4 (A) 4.0 - 6.0 %   Complete Blood Count (Most recent): No results found for: WBC, HGB, HCT, MCV, PLT Chemistry (most recent): Lab Results  Component Value Date   NA 141 10/29/2014   K 4.2 10/29/2014   CL 108 10/29/2014   CO2 25 10/29/2014   BUN 26* 10/29/2014   CREATININE 1.37* 10/29/2014   Diabetic Labs (most recent): Lab Results  Component Value Date   HGBA1C 11.4* 09/26/2014     Assessment & Plan:   1. Diabetes mellitus with stage 3 chronic kidney disease (HCC)   Her  diabetes is  complicated by chronic kidney disease and questionable CVA and patient remains at a high risk for more acute and chronic complications of diabetes which include CAD, CVA, CKD, retinopathy, and neuropathy. These are all discussed in detail with the patient.  Patient came with his log showing blood glucose between 300-400 averaging but she did not bring the meter to verify those readings.  -During her last visit she showed up with made up glucose profile between 150 and 160, and  recent A1c of 11.4 %.   -She remains noncommittal needed and would like to avoid insulin therapy. - I have re-counseled the patient on diet management and weight loss  by adopting a carbohydrate  restricted / protein rich  Diet.  - Suggestion is made for patient to avoid simple carbohydrates   from their diet including Cakes , Desserts, Ice Cream,  Soda (  diet and regular) , Sweet Tea , Candies,  Chips, Cookies, Artificial Sweeteners,   and "Sugar-free" Products .  This will help patient to have stable blood glucose profile and potentially avoid unintended  Weight gain.  - Patient is advised to stick to a routine mealtimes to eat 3 meals  a day and avoid unnecessary snacks ( to snack  only to correct hypoglycemia).  - The patient  has been  scheduled with Norm Salt, RDN, CDE for individualized DM education.  - I have approached patient with the following individualized plan to manage diabetes and patient agrees.  -Patient is seriously noncompliant. - ideally , she would  definitely need basal/bolus insulin therapy for optimal control of diabetes. -However, she did not display appropriate commitment for monitoring, and she declined even a basal insulin at this point. -Hence she is a high risk patient to give insulin to. - she must start with strict monitoring of BG AC and HS.  -Patient is encouraged to call clinic for blood glucose levels less than 70 or above 300 mg /dl. -She is not a candidate for Invokana, metformin due to advanced CKD. -I will increase her Amaryl to 4 mg by mouth every morning for now.  -Unfortunately, she does not seem to be convinced about the need to control diabetes.   - Patient specific target  for A1c; LDL, HDL, Triglycerides, and  Waist Circumference were discussed in detail.  2) BP/HTN: Controlled. Continue current medications including ACEI/ARB. 3) Lipids/HPL:  continue statins. 4)  Weight/Diet: CDE consult in progress, exercise, and carbohydrates information provided.  5)  Personal history of noncompliance with medical treatment, presenting hazards to health -I counseled this patient for 20 minutes.  6) Chronic Care/Health  Maintenance:  -Patient  on ACEI/ARB and Statin medications and encouraged to continue to follow up with Ophthalmology, Podiatrist at least yearly or according to recommendations, and advised to  stay away from smoking. I have recommended yearly flu vaccine and pneumonia vaccination at least every 5 years; moderate intensity exercise for up to 150 minutes weekly; and  sleep for at least 7 hours a day.  I advised patient to maintain close follow up with their PCP for primary care needs.  Patient is asked to bring meter and  blood glucose logs during their next visit.   Follow up plan: Return in about 2 weeks (around 03/05/2015) for diabetes, high blood pressure, high cholesterol, underactive thyroid, follow up with pre-visit labs, meter, and logs.  Marquis Lunch, MD Phone: [phone]  Fax: [phone]   02/19/2015, 11:25 AM

## 2015-02-19 NOTE — Patient Instructions (Signed)

## 2015-02-25 ENCOUNTER — Other Ambulatory Visit: Payer: Self-pay | Admitting: Adult Health

## 2015-02-25 NOTE — Telephone Encounter (Signed)
Refill for advair deferred to pcp

## 2015-02-28 ENCOUNTER — Other Ambulatory Visit: Payer: Self-pay | Admitting: "Endocrinology

## 2015-03-07 ENCOUNTER — Ambulatory Visit (INDEPENDENT_AMBULATORY_CARE_PROVIDER_SITE_OTHER): Payer: Medicaid Other | Admitting: "Endocrinology

## 2015-03-07 ENCOUNTER — Encounter: Payer: Self-pay | Admitting: "Endocrinology

## 2015-03-07 VITALS — BP 141/85 | HR 81 | Ht 70.0 in | Wt 267.0 lb

## 2015-03-07 DIAGNOSIS — N183 Chronic kidney disease, stage 3 (moderate): Secondary | ICD-10-CM

## 2015-03-07 DIAGNOSIS — E785 Hyperlipidemia, unspecified: Secondary | ICD-10-CM

## 2015-03-07 DIAGNOSIS — E1122 Type 2 diabetes mellitus with diabetic chronic kidney disease: Secondary | ICD-10-CM | POA: Diagnosis not present

## 2015-03-07 DIAGNOSIS — I10 Essential (primary) hypertension: Secondary | ICD-10-CM

## 2015-03-07 MED ORDER — INSULIN LISPRO PROT & LISPRO (50-50 MIX) 100 UNIT/ML KWIKPEN: 20.0000 [IU] | PEN_INJECTOR | Freq: Two times a day (BID) | SUBCUTANEOUS | Status: DC

## 2015-03-07 MED ORDER — INSULIN PEN NEEDLE 31G X 8 MM MISC: 1.0000 | Status: DC

## 2015-03-07 NOTE — Patient Instructions (Signed)

## 2015-03-07 NOTE — Progress Notes (Signed)
Subjective:    Patient ID: Katherine [name], female    DOB: 12-Apr-1960,    Past Medical History  Diagnosis Date  . Angina   . Asthma   . Shortness of breath   . Bronchitis   . Lupus (HCC)   . Seizures (HCC)     per patient statement  . Hypertension   . Stroke (HCC)   . CHF (congestive heart failure) (HCC)   . Pneumonia   . GERD (gastroesophageal reflux disease)   . Headache(784.0)   . Diabetes mellitus   . Fibromyalgia   . Nonischemic cardiomyopathy (HCC)     NYHA classII  . Fibromyalgia 10/03/2012   Past Surgical History  Procedure Laterality Date  . Icd  02/13/2011    Medtronic Protecta DR XT  . Tubal ligation    . Right ankle    . US echocardiography  11/09/2010    mild LV enlargement w/conc. LVH & severe global hypokinesis EF 30-35%,mod. diastolic dysfunction, mod. TR,mild AI,mild to mod MR,mild PI  . Left heart catheterization with coronary angiogram N/A 12/05/2010    Procedure: LEFT HEART CATHETERIZATION WITH CORONARY ANGIOGRAM;  Surgeon: Chrystie Nose, MD;  Location: [facility] CATH LAB;  Service: Cardiovascular;  Laterality: N/A;  . Implantable cardioverter defibrillator implant N/A 02/13/2011    Procedure: IMPLANTABLE CARDIOVERTER DEFIBRILLATOR IMPLANT;  Surgeon: Thurmon Fair, MD;  Location: [facility] CATH LAB;  Service: Cardiovascular;  Laterality: N/A;   Social History   Social History  . Marital Status: Legally Separated    Spouse Name: N/A  . Number of Children: N/A  . Years of Education: N/A   Social History Main Topics  . Smoking status: Former Smoker -- 1.00 packs/day    Types: Cigarettes    Quit date: 10/03/2005  . Smokeless tobacco: Never Used  . Alcohol Use: 0.0 oz/week    0 Standard drinks or equivalent per week     Comment: quit 2007  . Drug Use: No  . Sexual Activity: Yes   Other Topics Concern  . None   Social History Narrative   Outpatient Encounter Prescriptions as of 03/07/2015  Medication Sig  . albuterol (PROVENTIL HFA;VENTOLIN  HFA) 108 (90 BASE) MCG/ACT inhaler Inhale 2 puffs into the lungs every 6 (six) hours as needed. For wheezing and shortness of breath  . albuterol (PROVENTIL) (2.5 MG/3ML) 0.083% nebulizer solution Take 3 mLs (2.5 mg total) by nebulization every 6 (six) hours as needed. For wheezing and shortness of breath  . aspirin EC 81 MG tablet Take 81 mg by mouth daily.    . cetirizine (ZYRTEC) 10 MG tablet Take 10 mg by mouth as needed for allergies.  . cloNIDine (CATAPRES) 0.2 MG tablet Take 1 tablet (0.2 mg total) by mouth 2 (two) times daily.  . Fluticasone-Salmeterol (ADVAIR) 500-50 MCG/DOSE AEPB Inhale 2 puffs into the lungs 2 (two) times daily.  . furosemide (LASIX) 40 MG tablet Take 1 tablet (40 mg total) by mouth 2 (two) times daily. Will take 1 extra dose based on edema  . gabapentin (NEURONTIN) 300 MG capsule Take 300 mg by mouth 3 (three) times daily.    . hydroxychloroquine (PLAQUENIL) 200 MG tablet Take 200 mg by mouth 2 (two) times daily.    Marland Kitchen ibuprofen (ADVIL,MOTRIN) 600 MG tablet Take 600 mg by mouth every 8 (eight) hours as needed.  . Insulin Lispro Prot & Lispro (HUMALOG MIX 50/50 KWIKPEN) (50-50) 100 UNIT/ML Kwikpen Inject 20 Units into the skin 2 (two) times daily  with a meal.  . Insulin Pen Needle (B-D ULTRAFINE III SHORT PEN) 31G X 8 MM MISC 1 each by Does not apply route as directed.  . loratadine (CLARITIN) 10 MG tablet Take 10 mg by mouth daily as needed. For allergies   . losartan (COZAAR) 100 MG tablet Take 1 tablet (100 mg total) by mouth daily.  . meloxicam (MOBIC) 7.5 MG tablet Take 7.5 mg by mouth 2 (two) times daily as needed for pain.  . metolazone (ZAROXOLYN) 5 MG tablet Take 1 tablet (5 mg total) by mouth every other day. Take Monday, Wednesday, Friday  . metoprolol succinate (TOPROL-XL) 100 MG 24 hr tablet Take 1 tablet (100 mg total) by mouth daily. Take with or immediately following a meal.  . omeprazole (PRILOSEC) 20 MG capsule Take 1 capsule (20 mg total) by mouth 2  (two) times daily.  . promethazine (PHENERGAN) 25 MG tablet Take 25 mg by mouth every 8 (eight) hours as needed. nausea   . simvastatin (ZOCOR) 20 MG tablet Take 1 tablet (20 mg total) by mouth at bedtime.  Marland Kitchen spironolactone (ALDACTONE) 100 MG tablet Take 1 tablet (100 mg total) by mouth daily.  Marland Kitchen venlafaxine XR (EFFEXOR-XR) 150 MG 24 hr capsule Take 450 mg by mouth daily.  . [DISCONTINUED] glimepiride (AMARYL) 2 MG tablet Take 4mg  Once daily with breakfast   No facility-administered encounter medications on file as of 03/07/2015.   ALLERGIES: Allergies  Allergen Reactions  . Penicillins Itching    Can take amoxicillin without a reaction  . Shellfish Allergy Other (See Comments)    Causes to have grand mal seizures   VACCINATION STATUS:  There is no immunization history on file for this patient.  Diabetes She presents for her follow-up diabetic visit. She has type 2 diabetes mellitus. Onset time: She was diagnosed at approximate age of 74 years. Her disease course has been worsening. Pertinent negatives for hypoglycemia include no confusion, headaches, pallor or seizures. Associated symptoms include fatigue, polydipsia and polyuria. Pertinent negatives for diabetes include no chest pain and no polyphagia. There are no hypoglycemic complications. Symptoms are worsening. Diabetic complications include a CVA and nephropathy. Risk factors for coronary artery disease include diabetes mellitus, dyslipidemia, obesity, sedentary lifestyle, post-menopausal and hypertension. She is compliant with treatment none of the time. She is following a generally unhealthy diet. She has not had a previous visit with a dietitian. She never participates in exercise. Home blood sugar record trend: She brought her log showing questionable blood glucose readings or between 150 - 160, clearly unfortunately made up numbers. Her overall blood glucose range is >200 mg/dl. (She did not bring any meter to verify her BG profile.  He has a log which has a random blood glucose tests showing readings in 300s and 400s.) An ACE inhibitor/angiotensin II receptor blocker is being taken.  Hypertension This is a chronic problem. The current episode started more than 1 year ago. The problem is controlled. Pertinent negatives include no chest pain, headaches, palpitations or shortness of breath. Risk factors for coronary artery disease include dyslipidemia, diabetes mellitus, obesity and sedentary lifestyle. Past treatments include angiotensin blockers. Hypertensive end-organ damage includes kidney disease and CVA.  Hyperlipidemia This is a chronic problem. The current episode started more than 1 year ago. Pertinent negatives include no chest pain, myalgias or shortness of breath. Current antihyperlipidemic treatment includes statins.     Review of Systems  Constitutional: Positive for fatigue. Negative for unexpected weight change.  HENT: Negative for trouble  swallowing and voice change.   Eyes: Negative for visual disturbance.  Respiratory: Negative for cough, shortness of breath and wheezing.   Cardiovascular: Negative for chest pain, palpitations and leg swelling.  Gastrointestinal: Negative for nausea, vomiting and diarrhea.  Endocrine: Positive for polydipsia and polyuria. Negative for cold intolerance, heat intolerance and polyphagia.  Musculoskeletal: Negative for myalgias and arthralgias.  Skin: Negative for color change, pallor, rash and wound.  Neurological: Negative for seizures and headaches.  Psychiatric/Behavioral: Negative for suicidal ideas and confusion.    Objective:    BP 141/85 mmHg  Pulse 81  Ht 5\' 10"  (1.778 m)  Wt 267 lb (121.11 kg)  BMI 38.31 kg/m2  SpO2 96%  Wt Readings from Last 3 Encounters:  03/07/15 267 lb (121.11 kg)  02/19/15 274 lb (124.286 kg)  11/16/14 289 lb 9.6 oz (131.362 kg)    Physical Exam  Constitutional: She is oriented to person, place, and time. She appears well-developed.   HENT:  Head: Normocephalic and atraumatic.  Eyes: EOM are normal.  Neck: Normal range of motion. Neck supple. No tracheal deviation present. No thyromegaly present.  Cardiovascular: Normal rate and regular rhythm.   Pulmonary/Chest: Effort normal and breath sounds normal.  Abdominal: Soft. Bowel sounds are normal. There is no tenderness. There is no guarding.  Musculoskeletal: Normal range of motion. She exhibits no edema.  Neurological: She is alert and oriented to person, place, and time. She has normal reflexes. No cranial nerve deficit. Coordination normal.  Skin: Skin is warm and dry. No rash noted. No erythema. No pallor.  Psychiatric: She has a normal mood and affect. Judgment normal.  She has reluctant noncompliant affect.    Results for orders placed or performed in visit on 11/14/14  Hemoglobin A1c  Result Value Ref Range   Hgb A1c MFr Bld 11.4 (A) 4.0 - 6.0 %   Complete Blood Count (Most recent): No results found for: WBC, HGB, HCT, MCV, PLT Chemistry (most recent): Lab Results  Component Value Date   NA 141 10/29/2014   K 4.2 10/29/2014   CL 108 10/29/2014   CO2 25 10/29/2014   BUN 26* 10/29/2014   CREATININE 1.37* 10/29/2014   Diabetic Labs (most recent): Lab Results  Component Value Date   HGBA1C 11.4* 09/26/2014     Assessment & Plan:   1. Diabetes mellitus with stage 3 chronic kidney disease (HCC)   Her  diabetes is  complicated by chronic kidney disease and questionable CVA and patient remains at a high risk for more acute and chronic complications of diabetes which include CAD, CVA, CKD, retinopathy, and neuropathy. These are all discussed in detail with the patient.  Patient came with his log showing blood glucose between 300-400 averaging, despite 4 mg of Amaryl daily.    - I have re-counseled the patient on diet management and weight loss  by adopting a carbohydrate restricted / protein rich  Diet.  - Suggestion is made for patient to avoid  simple carbohydrates   from their diet including Cakes , Desserts, Ice Cream,  Soda (  diet and regular) , Sweet Tea , Candies,  Chips, Cookies, Artificial Sweeteners,   and "Sugar-free" Products .  This will help patient to have stable blood glucose profile and potentially avoid unintended  Weight gain.  - Patient is advised to stick to a routine mealtimes to eat 3 meals  a day and avoid unnecessary snacks ( to snack only to correct hypoglycemia).  - The patient  has  been  scheduled with Norm Salt, RDN, CDE for individualized DM education.  - I have approached patient with the following individualized plan to manage diabetes and patient agrees.  -Patient has been seriously noncompliant. - Ideally , she would  definitely need basal/bolus insulin therapy for optimal control of diabetes. -However, she did not display appropriate commitment for monitoring, and she declined even a basal insulin at this point. -She has  recent A1c of 11.4 %.   -She is approached for insulin therapy associated with strict monitoring of blood glucose and she agrees. -I will initiate Humalog 50/50 20 units with breakfast and 20 units with supper for blood glucose above 90 mg/dL. -Written instructions given.   -Patient is encouraged to call clinic for blood glucose levels less than 70 or above 300 mg /dl. -She is not a candidate for Invokana, metformin due to advanced CKD. -I will decrease crease her Amaryl to 2 mg by mouth every morning for now, will discontinue by next visit.   - Patient specific target  for A1c; LDL, HDL, Triglycerides, and  Waist Circumference were discussed in detail.  2) BP/HTN: Controlled. Continue current medications including ACEI/ARB. 3) Lipids/HPL:  continue statins. 4)  Weight/Diet: CDE consult in progress, exercise, and carbohydrates information provided.  5)  Personal history of noncompliance with medical treatment, presenting hazards to health -I counseled this patient for 20  minutes.  6) Chronic Care/Health Maintenance:  -Patient  on ACEI/ARB and Statin medications and encouraged to continue to follow up with Ophthalmology, Podiatrist at least yearly or according to recommendations, and advised to  stay away from smoking. I have recommended yearly flu vaccine and pneumonia vaccination at least every 5 years; moderate intensity exercise for up to 150 minutes weekly; and  sleep for at least 7 hours a day.  I advised patient to maintain close follow up with their PCP for primary care needs.  Patient is asked to bring meter and  blood glucose logs during their next visit.   Follow up plan: Return in about 2 weeks (around 03/21/2015) for follow up with meter and logs- no labs.  Marquis Lunch, MD Phone: [phone]  Fax: [phone]   03/07/2015, 3:10 PM

## 2015-03-12 ENCOUNTER — Other Ambulatory Visit: Payer: Self-pay

## 2015-03-12 MED ORDER — SIMVASTATIN 20 MG PO TABS: 20.0000 mg | ORAL_TABLET | Freq: Every day | ORAL | Status: DC

## 2015-03-12 NOTE — Telephone Encounter (Signed)
Refill complete 

## 2015-03-14 ENCOUNTER — Other Ambulatory Visit: Payer: Self-pay

## 2015-03-14 MED ORDER — CLONIDINE HCL 0.2 MG PO TABS: 0.2000 mg | ORAL_TABLET | Freq: Two times a day (BID) | ORAL | Status: DC

## 2015-03-14 NOTE — Telephone Encounter (Signed)
Refill complete 

## 2015-03-19 ENCOUNTER — Telehealth: Payer: Self-pay

## 2015-03-19 NOTE — Telephone Encounter (Signed)
Darl Pikes, I do not have a referral on this pt.

## 2015-03-19 NOTE — Telephone Encounter (Signed)
[facility] called to check on a referral on this patient. Do you have anything from them on her? I didn't see a triage letter.

## 2015-03-20 NOTE — Telephone Encounter (Signed)
PCP is aware we haven't received referral and they should be faxing to [place].

## 2015-03-21 NOTE — Telephone Encounter (Signed)
Received referral and mailed letter to pt to call.

## 2015-03-22 ENCOUNTER — Ambulatory Visit: Payer: Medicaid Other | Admitting: "Endocrinology

## 2015-04-04 ENCOUNTER — Encounter: Payer: Self-pay | Admitting: "Endocrinology

## 2015-04-04 ENCOUNTER — Ambulatory Visit (INDEPENDENT_AMBULATORY_CARE_PROVIDER_SITE_OTHER): Payer: Medicaid Other | Admitting: "Endocrinology

## 2015-04-04 VITALS — BP 148/84 | HR 82 | Ht 70.0 in | Wt 279.0 lb

## 2015-04-04 DIAGNOSIS — N183 Chronic kidney disease, stage 3 (moderate): Secondary | ICD-10-CM | POA: Diagnosis not present

## 2015-04-04 DIAGNOSIS — I10 Essential (primary) hypertension: Secondary | ICD-10-CM

## 2015-04-04 DIAGNOSIS — E1122 Type 2 diabetes mellitus with diabetic chronic kidney disease: Secondary | ICD-10-CM | POA: Diagnosis not present

## 2015-04-04 DIAGNOSIS — Z91199 Patient's noncompliance with other medical treatment and regimen due to unspecified reason: Secondary | ICD-10-CM

## 2015-04-04 DIAGNOSIS — E785 Hyperlipidemia, unspecified: Secondary | ICD-10-CM | POA: Diagnosis not present

## 2015-04-04 DIAGNOSIS — Z9119 Patient's noncompliance with other medical treatment and regimen: Secondary | ICD-10-CM

## 2015-04-04 NOTE — Progress Notes (Signed)
Subjective:    Patient ID: Katherine [name], female    DOB: 14-Feb-1960,    Past Medical History  Diagnosis Date  . Angina   . Asthma   . Shortness of breath   . Bronchitis   . Lupus (HCC)   . Seizures (HCC)     per patient statement  . Hypertension   . Stroke (HCC)   . CHF (congestive heart failure) (HCC)   . Pneumonia   . GERD (gastroesophageal reflux disease)   . Headache(784.0)   . Diabetes mellitus   . Fibromyalgia   . Nonischemic cardiomyopathy (HCC)     NYHA classII  . Fibromyalgia 10/03/2012   Past Surgical History  Procedure Laterality Date  . Icd  02/13/2011    Medtronic Protecta DR XT  . Tubal ligation    . Right ankle    . US echocardiography  11/09/2010    mild LV enlargement w/conc. LVH & severe global hypokinesis EF 30-35%,mod. diastolic dysfunction, mod. TR,mild AI,mild to mod MR,mild PI  . Left heart catheterization with coronary angiogram N/A 12/05/2010    Procedure: LEFT HEART CATHETERIZATION WITH CORONARY ANGIOGRAM;  Surgeon: Chrystie Nose, MD;  Location: [facility] CATH LAB;  Service: Cardiovascular;  Laterality: N/A;  . Implantable cardioverter defibrillator implant N/A 02/13/2011    Procedure: IMPLANTABLE CARDIOVERTER DEFIBRILLATOR IMPLANT;  Surgeon: Thurmon Fair, MD;  Location: [facility] CATH LAB;  Service: Cardiovascular;  Laterality: N/A;   Social History   Social History  . Marital Status: Legally Separated    Spouse Name: N/A  . Number of Children: N/A  . Years of Education: N/A   Social History Main Topics  . Smoking status: Former Smoker -- 1.00 packs/day    Types: Cigarettes    Quit date: 10/03/2005  . Smokeless tobacco: Never Used  . Alcohol Use: 0.0 oz/week    0 Standard drinks or equivalent per week     Comment: quit 2007  . Drug Use: No  . Sexual Activity: Yes   Other Topics Concern  . None   Social History Narrative   Outpatient Encounter Prescriptions as of 04/04/2015  Medication Sig  . albuterol (PROVENTIL HFA;VENTOLIN  HFA) 108 (90 BASE) MCG/ACT inhaler Inhale 2 puffs into the lungs every 6 (six) hours as needed. For wheezing and shortness of breath  . albuterol (PROVENTIL) (2.5 MG/3ML) 0.083% nebulizer solution Take 3 mLs (2.5 mg total) by nebulization every 6 (six) hours as needed. For wheezing and shortness of breath  . aspirin EC 81 MG tablet Take 81 mg by mouth daily.    . cetirizine (ZYRTEC) 10 MG tablet Take 10 mg by mouth as needed for allergies.  . cloNIDine (CATAPRES) 0.2 MG tablet Take 1 tablet (0.2 mg total) by mouth 2 (two) times daily.  . Fluticasone-Salmeterol (ADVAIR) 500-50 MCG/DOSE AEPB Inhale 2 puffs into the lungs 2 (two) times daily.  . furosemide (LASIX) 40 MG tablet Take 1 tablet (40 mg total) by mouth 2 (two) times daily. Will take 1 extra dose based on edema  . gabapentin (NEURONTIN) 300 MG capsule Take 300 mg by mouth 3 (three) times daily.    . hydroxychloroquine (PLAQUENIL) 200 MG tablet Take 200 mg by mouth 2 (two) times daily.    Marland Kitchen ibuprofen (ADVIL,MOTRIN) 600 MG tablet Take 600 mg by mouth every 8 (eight) hours as needed.  . Insulin Lispro Prot & Lispro (HUMALOG MIX 50/50 KWIKPEN) (50-50) 100 UNIT/ML Kwikpen Inject 20 Units into the skin 2 (two) times daily  with a meal.  . Insulin Pen Needle (B-D ULTRAFINE III SHORT PEN) 31G X 8 MM MISC 1 each by Does not apply route as directed.  . loratadine (CLARITIN) 10 MG tablet Take 10 mg by mouth daily as needed. For allergies   . losartan (COZAAR) 100 MG tablet Take 1 tablet (100 mg total) by mouth daily.  . meloxicam (MOBIC) 7.5 MG tablet Take 7.5 mg by mouth 2 (two) times daily as needed for pain.  . metolazone (ZAROXOLYN) 5 MG tablet Take 1 tablet (5 mg total) by mouth every other day. Take Monday, Wednesday, Friday  . metoprolol succinate (TOPROL-XL) 100 MG 24 hr tablet Take 1 tablet (100 mg total) by mouth daily. Take with or immediately following a meal.  . omeprazole (PRILOSEC) 20 MG capsule Take 1 capsule (20 mg total) by mouth 2  (two) times daily.  . promethazine (PHENERGAN) 25 MG tablet Take 25 mg by mouth every 8 (eight) hours as needed. nausea   . simvastatin (ZOCOR) 20 MG tablet Take 1 tablet (20 mg total) by mouth at bedtime.  Marland Kitchen spironolactone (ALDACTONE) 100 MG tablet Take 1 tablet (100 mg total) by mouth daily.  Marland Kitchen venlafaxine XR (EFFEXOR-XR) 150 MG 24 hr capsule Take 450 mg by mouth daily.   No facility-administered encounter medications on file as of 04/04/2015.   ALLERGIES: Allergies  Allergen Reactions  . Penicillins Itching    Can take amoxicillin without a reaction  . Shellfish Allergy Other (See Comments)    Causes to have grand mal seizures   VACCINATION STATUS:  There is no immunization history on file for this patient.  Diabetes She presents for her follow-up diabetic visit. She has type 2 diabetes mellitus. Onset time: She was diagnosed at approximate age of 75 years. Her disease course has been improving. Pertinent negatives for hypoglycemia include no confusion, headaches, pallor or seizures. Associated symptoms include fatigue. Pertinent negatives for diabetes include no chest pain, no polydipsia, no polyphagia and no polyuria. There are no hypoglycemic complications. Symptoms are improving. Diabetic complications include a CVA and nephropathy. Risk factors for coronary artery disease include diabetes mellitus, dyslipidemia, obesity, sedentary lifestyle, post-menopausal and hypertension. She is compliant with treatment none of the time. Her weight is increasing steadily. She is following a generally unhealthy diet. She has not had a previous visit with a dietitian. She never participates in exercise. Home blood sugar record trend: She brought her log  showing better  blood glucose readings or between 150 - 160. Her overall blood glucose range is 140-180 mg/dl. An ACE inhibitor/angiotensin II receptor blocker is being taken.  Hypertension This is a chronic problem. The current episode started more  than 1 year ago. The problem is controlled. Pertinent negatives include no chest pain, headaches, palpitations or shortness of breath. Risk factors for coronary artery disease include dyslipidemia, diabetes mellitus, obesity and sedentary lifestyle. Past treatments include angiotensin blockers. Hypertensive end-organ damage includes kidney disease and CVA.  Hyperlipidemia This is a chronic problem. The current episode started more than 1 year ago. Pertinent negatives include no chest pain, myalgias or shortness of breath. Current antihyperlipidemic treatment includes statins.     Review of Systems  Constitutional: Positive for fatigue. Negative for unexpected weight change.  HENT: Negative for trouble swallowing and voice change.   Eyes: Negative for visual disturbance.  Respiratory: Negative for cough, shortness of breath and wheezing.   Cardiovascular: Negative for chest pain, palpitations and leg swelling.  Gastrointestinal: Negative for nausea, vomiting and  diarrhea.  Endocrine: Negative for cold intolerance, heat intolerance, polydipsia, polyphagia and polyuria.  Musculoskeletal: Negative for myalgias and arthralgias.  Skin: Negative for color change, pallor, rash and wound.  Neurological: Negative for seizures and headaches.  Psychiatric/Behavioral: Negative for suicidal ideas and confusion.    Objective:    BP 148/84 mmHg  Pulse 82  Ht  (1.778 m)  Wt 279 lb (126.554 kg)  BMI 40.03 kg/m2  SpO2 99%  Wt Readings from Last 3 Encounters:  04/04/15 279 lb (126.554 kg)  03/07/15 267 lb (121.11 kg)  02/19/15 274 lb (124.286 kg)    Physical Exam  Constitutional: She is oriented to person, place, and time. She appears well-developed.  HENT:  Head: Normocephalic and atraumatic.  Eyes: EOM are normal.  Neck: Normal range of motion. Neck supple. No tracheal deviation present. No thyromegaly present.  Cardiovascular: Normal rate and regular rhythm.   Pulmonary/Chest: Effort  normal and breath sounds normal.  Abdominal: Soft. Bowel sounds are normal. There is no tenderness. There is no guarding.  Musculoskeletal: Normal range of motion. She exhibits no edema.  Neurological: She is alert and oriented to person, place, and time. She has normal reflexes. No cranial nerve deficit. Coordination normal.  Skin: Skin is warm and dry. No rash noted. No erythema. No pallor.  Psychiatric: She has a normal mood and affect. Judgment normal.  She has reluctant noncompliant affect.    Results for orders placed or performed in visit on 11/14/14  Hemoglobin A1c  Result Value Ref Range   Hgb A1c MFr Bld 11.4 (A) 4.0 - 6.0 %   Complete Blood Count (Most recent): No results found for: WBC, HGB, HCT, MCV, PLT Chemistry (most recent): Lab Results  Component Value Date   NA 141 10/29/2014   K 4.2 10/29/2014   CL 108 10/29/2014   CO2 25 10/29/2014   BUN 26* 10/29/2014   CREATININE 1.37* 10/29/2014   Diabetic Labs (most recent): Lab Results  Component Value Date   HGBA1C 11.4* 09/26/2014     Assessment & Plan:   1. Diabetes mellitus with stage 3 chronic kidney disease (HCC)   Her  diabetes is  complicated by chronic kidney disease and questionable CVA and patient remains at a high risk for more acute and chronic complications of diabetes which include CAD, CVA, CKD, retinopathy, and neuropathy. These are all discussed in detail with the patient.  Patient came with Better  blood glucose readings .     - I have re-counseled the patient on diet management and weight loss  by adopting a carbohydrate restricted / protein rich  Diet.  - Suggestion is made for patient to avoid simple carbohydrates   from their diet including Cakes , Desserts, Ice Cream,  Soda (  diet and regular) , Sweet Tea , Candies,  Chips, Cookies, Artificial Sweeteners,   and "Sugar-free" Products .  This will help patient to have stable blood glucose profile and potentially avoid unintended  Weight  gain.  - Patient is advised to stick to a routine mealtimes to eat 3 meals  a day and avoid unnecessary snacks ( to snack only to correct hypoglycemia).  - The patient  has been  scheduled with Norm Salt, RDN, CDE for individualized DM education.  - I have approached patient with the following individualized plan to manage diabetes and patient agrees.  -Patient has been seriously noncompliant. - Ideally , she would  definitely need basal/bolus insulin therapy for optimal control of diabetes. -  However, she did not display appropriate commitment for monitoring, and she declined even a basal insulin at this point. -She has  recent A1c of 11.4 %.   -She is approached for insulin therapy associated with strict monitoring of blood glucose and she agrees. -I will  Continue Humalog 50/50 20 units with breakfast and 20 units with supper for blood glucose above 90 mg/dL. -Written instructions given.   -Patient is encouraged to call clinic for blood glucose levels less than 70 or above 300 mg /dl. -She is not a candidate for Invokana, metformin due to advanced CKD. -I will  Discontinue amaryl .  - Patient specific target  for A1c; LDL, HDL, Triglycerides, and  Waist Circumference were discussed in detail.  2) BP/HTN: Controlled. Continue current medications including ACEI/ARB. 3) Lipids/HPL:  continue statins. 4)  Weight/Diet: CDE consult in progress, exercise, and carbohydrates information provided.  5)  Personal history of noncompliance with medical treatment, presenting hazards to health -I counseled this patient for 20 minutes.  6) Chronic Care/Health Maintenance:  -Patient  on ACEI/ARB and Statin medications and encouraged to continue to follow up with Ophthalmology, Podiatrist at least yearly or according to recommendations, and advised to  stay away from smoking. I have recommended yearly flu vaccine and pneumonia vaccination at least every 5 years; moderate intensity exercise for up  to 150 minutes weekly; and  sleep for at least 7 hours a day.  I advised patient to maintain close follow up with their PCP for primary care needs.  Patient is asked to bring meter and  blood glucose logs during their next visit.   Follow up plan: Return in about 3 months (around 07/05/2015) for diabetes, high blood pressure, high cholesterol, follow up with pre-visit labs, meter, and logs.  Marquis Lunch, MD Phone: [phone]  Fax: [phone]   04/04/2015, 4:43 PM

## 2015-04-04 NOTE — Patient Instructions (Signed)

## 2015-04-12 ENCOUNTER — Ambulatory Visit: Payer: Medicaid Other | Admitting: Adult Health

## 2015-04-22 ENCOUNTER — Encounter: Payer: Medicaid Other | Admitting: Adult Health

## 2015-04-22 NOTE — Progress Notes (Signed)
ERROR

## 2015-04-29 ENCOUNTER — Encounter: Payer: Self-pay | Admitting: Adult Health

## 2015-04-29 ENCOUNTER — Ambulatory Visit (INDEPENDENT_AMBULATORY_CARE_PROVIDER_SITE_OTHER): Payer: Medicaid Other | Admitting: Adult Health

## 2015-04-29 VITALS — BP 136/78 | HR 104 | Ht 70.0 in | Wt 274.0 lb

## 2015-04-29 DIAGNOSIS — I420 Dilated cardiomyopathy: Secondary | ICD-10-CM | POA: Diagnosis not present

## 2015-04-29 DIAGNOSIS — I10 Essential (primary) hypertension: Secondary | ICD-10-CM | POA: Diagnosis not present

## 2015-04-29 DIAGNOSIS — Z95810 Presence of automatic (implantable) cardiac defibrillator: Secondary | ICD-10-CM | POA: Diagnosis not present

## 2015-04-29 MED ORDER — METOPROLOL SUCCINATE ER 100 MG PO TB24: 100.0000 mg | ORAL_TABLET | Freq: Every day | ORAL | Status: DC

## 2015-04-29 MED ORDER — LOSARTAN POTASSIUM 100 MG PO TABS: 100.0000 mg | ORAL_TABLET | Freq: Every day | ORAL | Status: DC

## 2015-04-29 MED ORDER — SIMVASTATIN 20 MG PO TABS: 20.0000 mg | ORAL_TABLET | Freq: Every day | ORAL | Status: DC

## 2015-04-29 NOTE — Patient Instructions (Signed)
Medication Instructions:  Your physician recommends that you continue on your current medications as directed. Please refer to the Current Medication list given to you today.   Labwork: NONE  Testing/Procedures: NONE  Follow-Up: Your physician wants you to follow-up in: 1 YEAR.  You will receive a reminder letter in the mail two months in advance. If you don't receive a letter, please call our office to schedule the follow-up appointment.   Any Other Special Instructions Will Be Listed Below (If Applicable).  I sent in refills for your cardiac medication.    If you need a refill on your cardiac medications before your next appointment, please call your pharmacy.

## 2015-04-29 NOTE — Progress Notes (Signed)
aCardiology Office Note   Date:  04/29/2015   ID:  Katherine [name], DOB 06-Feb-1960, MRN [id number]  PCP:  Alleen Borne  Cardiologist: Inis Sizer, NP   Chief Complaint  Patient presents with  . Cardiomyopathy  . Hypertension  . Congestive Heart Failure      History of Present Illness: Katherine [name] is a 55 y.o. female who presents for ongoing assessment and management of nonischemic cardiomyopathy, EF of 28%, chronic systolic heart failure, with history of hypertension, SLE, fibromyalgia, and obesity.  The patient has an ICD in situ and is followed by [name].  She was last in the office in October of 2016,at which time she had had a recent history of fluid overload and had given a prescription metolazone.  On followup visit, she was euvolemic and had not had to take any additional doses.  Without any complaints.  She is followed by an endocrinologist and her primary care physician.  She continues to have generalized complaints of muscle aches and pains related to fibromyalgia.  She is medically compliant concerning antihypertensive medications.  She is received a letter from our office to have her pacemaker interrogation appointment, which is annual, with [name], but has not yet made an appointment.  She is otherwise doing well remains active.  She is due to have blood work done next month by her primary care physician.  Past Medical History  Diagnosis Date  . Angina   . Asthma   . Shortness of breath   . Bronchitis   . Lupus (HCC)   . Seizures (HCC)     per patient statement  . Hypertension   . Stroke (HCC)   . CHF (congestive heart failure) (HCC)   . Pneumonia   . GERD (gastroesophageal reflux disease)   . Headache(784.0)   . Diabetes mellitus   . Fibromyalgia   . Nonischemic cardiomyopathy (HCC)     NYHA classII  . Fibromyalgia 10/03/2012    Past Surgical History  Procedure Laterality Date  . Icd  02/13/2011    Medtronic Protecta  DR XT  . Tubal ligation    . Right ankle    . US echocardiography  11/09/2010    mild LV enlargement w/conc. LVH & severe global hypokinesis EF 30-35%,mod. diastolic dysfunction, mod. TR,mild AI,mild to mod MR,mild PI  . Left heart catheterization with coronary angiogram N/A 12/05/2010    Procedure: LEFT HEART CATHETERIZATION WITH CORONARY ANGIOGRAM;  Surgeon: Chrystie Nose, MD;  Location: [facility] CATH LAB;  Service: Cardiovascular;  Laterality: N/A;  . Implantable cardioverter defibrillator implant N/A 02/13/2011    Procedure: IMPLANTABLE CARDIOVERTER DEFIBRILLATOR IMPLANT;  Surgeon: Thurmon Fair, MD;  Location: [facility] CATH LAB;  Service: Cardiovascular;  Laterality: N/A;     Current Outpatient Prescriptions  Medication Sig Dispense Refill  . albuterol (PROVENTIL HFA;VENTOLIN HFA) 108 (90 BASE) MCG/ACT inhaler Inhale 2 puffs into the lungs every 6 (six) hours as needed. For wheezing and shortness of breath    . albuterol (PROVENTIL) (2.5 MG/3ML) 0.083% nebulizer solution Take 3 mLs (2.5 mg total) by nebulization every 6 (six) hours as needed. For wheezing and shortness of breath 75 mL 3  . aspirin EC 81 MG tablet Take 81 mg by mouth daily.      . cetirizine (ZYRTEC) 10 MG tablet Take 10 mg by mouth as needed for allergies.    . cloNIDine (CATAPRES) 0.2 MG tablet Take 1 tablet (0.2 mg total) by mouth 2 (two) times daily.  60 tablet 6  . Fluticasone-Salmeterol (ADVAIR) 500-50 MCG/DOSE AEPB Inhale 2 puffs into the lungs 2 (two) times daily. 60 each 0  . furosemide (LASIX) 40 MG tablet Take 1 tablet (40 mg total) by mouth 2 (two) times daily. Will take 1 extra dose based on edema 90 tablet 2  . gabapentin (NEURONTIN) 300 MG capsule Take 300 mg by mouth 3 (three) times daily.      . hydroxychloroquine (PLAQUENIL) 200 MG tablet Take 200 mg by mouth 2 (two) times daily.      Marland Kitchen ibuprofen (ADVIL,MOTRIN) 600 MG tablet Take 600 mg by mouth every 8 (eight) hours as needed.    . Insulin Lispro Prot & Lispro  (HUMALOG MIX 50/50 KWIKPEN) (50-50) 100 UNIT/ML Kwikpen Inject 20 Units into the skin 2 (two) times daily with a meal. 15 mL 2  . Insulin Pen Needle (B-D ULTRAFINE III SHORT PEN) 31G X 8 MM MISC 1 each by Does not apply route as directed. 100 each 3  . loratadine (CLARITIN) 10 MG tablet Take 10 mg by mouth daily as needed. For allergies     . losartan (COZAAR) 100 MG tablet Take 1 tablet (100 mg total) by mouth daily. 90 tablet 3  . meloxicam (MOBIC) 7.5 MG tablet Take 7.5 mg by mouth 2 (two) times daily as needed for pain.    . metolazone (ZAROXOLYN) 5 MG tablet Take 1 tablet (5 mg total) by mouth every other day. Take Monday, Wednesday, Friday 30 tablet 9  . metoprolol succinate (TOPROL-XL) 100 MG 24 hr tablet Take 1 tablet (100 mg total) by mouth daily. Take with or immediately following a meal. 90 tablet 3  . omeprazole (PRILOSEC) 20 MG capsule Take 1 capsule (20 mg total) by mouth 2 (two) times daily. 180 capsule 3  . promethazine (PHENERGAN) 25 MG tablet Take 25 mg by mouth every 8 (eight) hours as needed. nausea     . simvastatin (ZOCOR) 20 MG tablet Take 1 tablet (20 mg total) by mouth at bedtime. 30 tablet 9  . spironolactone (ALDACTONE) 100 MG tablet Take 1 tablet (100 mg total) by mouth daily. 90 tablet 3  . venlafaxine XR (EFFEXOR-XR) 150 MG 24 hr capsule Take 450 mg by mouth daily.     No current facility-administered medications for this visit.    Allergies:   Penicillins and Shellfish allergy    Social History:  The patient  reports that she quit smoking about 9 years ago. Her smoking use included Cigarettes. She smoked 1.00 pack per day. She has never used smokeless tobacco. She reports that she drinks alcohol. She reports that she does not use illicit drugs.   Family History:  The patient's family history is not on file.    ROS: All other systems are reviewed and negative. Unless otherwise mentioned in H&P    PHYSICAL EXAM: VS:  BP 136/78 mmHg  Pulse 104  Ht   (1.778 m)  Wt 274 lb (124.286 kg)  BMI 39.32 kg/m2  SpO2 99% , BMI Body mass index is 39.32 kg/(m^2). GEN: Well nourished, well developed, in no acute distressmorbidly obese. HEENT: normal Neck: no JVD, carotid bruits, or masses Cardiac: RRR; tachycardic,no murmurs, rubs, or gallops,no edema  Respiratory:  C lear to auscultation bilaterally, normal work of breathing GI: soft, nontender, nondistended, + BS MS: no deformity or atrophy Skin: warm and dry, no rash Neuro:  Strength and sensation are intact Psych: euthymic mood, full affect   Recent Labs: 10/29/2014:  BUN 26*; Creat 1.37*; Potassium 4.2; Sodium 141    Lipid Panel No results found for: CHOL, TRIG, HDL, CHOLHDL, VLDL, LDLCALC, LDLDIRECT    Wt Readings from Last 3 Encounters:  04/29/15 274 lb (124.286 kg)  04/04/15 279 lb (126.554 kg)  03/07/15 267 lb (121.11 kg)      ASSESSMENT AND PLAN:  1.  Hypertensive cardiomyopathy: As recent echo revealed EF of 20%.  Patient blood pressure is controlled currently on multiple medications to include clonidine 0.2 mg, spironolactone 100 mg daily, losartan 100 mg daily, and Lasix 40 mg twice a day.  She denies any fluid retention or significant weight gain.  2. ICD in situ:followup appointment with [name] for a pacemaker interrogation of annual evaluation.  Will be made today before she leaves.  No changes in her medicine.  3. Diabetes is fibromyalgia: She is being followed by her primary care physician for labs and management.   Current medicines are reviewed at length with the patient today.    Labs/ tests ordered today include: to be completed. By  primary care physician No orders of the defined types were placed in this encounter.     Disposition:   FU with our office one year, [name] will see here on followup appointment, which will be made today.  Signed, Joni Reining, NP  04/29/2015 2:25 PM    [facility] [address]  [place], [place] [address] Phone: [phone]; Fax: [phone]

## 2015-04-29 NOTE — Progress Notes (Deleted)
Name: Katherine [name]    DOB: [birth year]  Age: 55 y.o.  MR#: [id number]       PCP:  Alleen Borne      Insurance: Payor: MEDICAID [place] / Plan: MEDICAID [place] ACCESS / Product Type: *No Product type* /   CC:   No chief complaint on file.   VS Filed Vitals:   04/29/15 1417  BP: 136/78  Pulse: 104  Height:  (1.778 m)  Weight: 274 lb (124.286 kg)  SpO2: 99%    Weights Current Weight  04/29/15 274 lb (124.286 kg)  04/04/15 279 lb (126.554 kg)  03/07/15 267 lb (121.11 kg)    Blood Pressure  BP Readings from Last 3 Encounters:  04/29/15 136/78  04/04/15 148/84  03/07/15 141/85     Admit date:  (Not on file) Last encounter with RMR:  02/25/2015   Allergy Penicillins and Shellfish allergy  Current Outpatient Prescriptions  Medication Sig Dispense Refill  . albuterol (PROVENTIL HFA;VENTOLIN HFA) 108 (90 BASE) MCG/ACT inhaler Inhale 2 puffs into the lungs every 6 (six) hours as needed. For wheezing and shortness of breath    . albuterol (PROVENTIL) (2.5 MG/3ML) 0.083% nebulizer solution Take 3 mLs (2.5 mg total) by nebulization every 6 (six) hours as needed. For wheezing and shortness of breath 75 mL 3  . aspirin EC 81 MG tablet Take 81 mg by mouth daily.      . cetirizine (ZYRTEC) 10 MG tablet Take 10 mg by mouth as needed for allergies.    . cloNIDine (CATAPRES) 0.2 MG tablet Take 1 tablet (0.2 mg total) by mouth 2 (two) times daily. 60 tablet 6  . Fluticasone-Salmeterol (ADVAIR) 500-50 MCG/DOSE AEPB Inhale 2 puffs into the lungs 2 (two) times daily. 60 each 0  . furosemide (LASIX) 40 MG tablet Take 1 tablet (40 mg total) by mouth 2 (two) times daily. Will take 1 extra dose based on edema 90 tablet 2  . gabapentin (NEURONTIN) 300 MG capsule Take 300 mg by mouth 3 (three) times daily.      . hydroxychloroquine (PLAQUENIL) 200 MG tablet Take 200 mg by mouth 2 (two) times daily.      Marland Kitchen ibuprofen (ADVIL,MOTRIN) 600 MG tablet Take 600 mg by mouth every 8 (eight) hours as  needed.    . Insulin Lispro Prot & Lispro (HUMALOG MIX 50/50 KWIKPEN) (50-50) 100 UNIT/ML Kwikpen Inject 20 Units into the skin 2 (two) times daily with a meal. 15 mL 2  . Insulin Pen Needle (B-D ULTRAFINE III SHORT PEN) 31G X 8 MM MISC 1 each by Does not apply route as directed. 100 each 3  . loratadine (CLARITIN) 10 MG tablet Take 10 mg by mouth daily as needed. For allergies     . losartan (COZAAR) 100 MG tablet Take 1 tablet (100 mg total) by mouth daily. 90 tablet 3  . meloxicam (MOBIC) 7.5 MG tablet Take 7.5 mg by mouth 2 (two) times daily as needed for pain.    . metolazone (ZAROXOLYN) 5 MG tablet Take 1 tablet (5 mg total) by mouth every other day. Take Monday, Wednesday, Friday 30 tablet 9  . metoprolol succinate (TOPROL-XL) 100 MG 24 hr tablet Take 1 tablet (100 mg total) by mouth daily. Take with or immediately following a meal. 90 tablet 3  . omeprazole (PRILOSEC) 20 MG capsule Take 1 capsule (20 mg total) by mouth 2 (two) times daily. 180 capsule 3  . promethazine (PHENERGAN) 25 MG tablet Take 25 mg  by mouth every 8 (eight) hours as needed. nausea     . simvastatin (ZOCOR) 20 MG tablet Take 1 tablet (20 mg total) by mouth at bedtime. 30 tablet 9  . spironolactone (ALDACTONE) 100 MG tablet Take 1 tablet (100 mg total) by mouth daily. 90 tablet 3  . venlafaxine XR (EFFEXOR-XR) 150 MG 24 hr capsule Take 450 mg by mouth daily.     No current facility-administered medications for this visit.    Discontinued Meds:   There are no discontinued medications.  Patient Active Problem List   Diagnosis Date Noted  . Personal history of noncompliance with medical treatment, presenting hazards to health 02/19/2015  . Acute on chronic systolic congestive heart failure, NYHA class 2 (HCC) 11/17/2012  . Non-ischemic cardiomyopathy (HCC) 10/03/2012  . AICD (automatic cardioverter/defibrillator) present 10/03/2012  . HTN (hypertension) 10/03/2012  . Diabetes mellitus with stage 3 chronic kidney  disease (HCC) 10/03/2012  . Fibromyalgia 10/03/2012  . Dyslipidemia 10/03/2012  . Lupus (HCC) 10/03/2012  . Stroke (HCC) 10/03/2012    LABS    Component Value Date/Time   NA 141 10/29/2014 1326   NA 137 10/12/2014 1638   NA 140 10/03/2012 1355   K 4.2 10/29/2014 1326   K 4.3 10/12/2014 1638   K 3.9 10/03/2012 1355   CL 108 10/29/2014 1326   CL 103 10/12/2014 1638   CL 103 10/03/2012 1355   CO2 25 10/29/2014 1326   CO2 26 10/12/2014 1638   CO2 29 10/03/2012 1355   GLUCOSE 124* 10/29/2014 1326   GLUCOSE 101* 10/12/2014 1638   GLUCOSE 127* 10/03/2012 1355   BUN 26* 10/29/2014 1326   BUN 52* 10/12/2014 1638   BUN 17 10/03/2012 1355   CREATININE 1.37* 10/29/2014 1326   CREATININE 2.67* 10/12/2014 1638   CREATININE 1.10 10/03/2012 1355   CREATININE 0.86 12/25/2010 1004   CALCIUM 9.1 10/29/2014 1326   CALCIUM 9.0 10/12/2014 1638   CALCIUM 9.2 10/03/2012 1355   GFRNONAA 77* 12/25/2010 1004   GFRAA 90* 12/25/2010 1004   CMP     Component Value Date/Time   NA 141 10/29/2014 1326   K 4.2 10/29/2014 1326   CL 108 10/29/2014 1326   CO2 25 10/29/2014 1326   GLUCOSE 124* 10/29/2014 1326   BUN 26* 10/29/2014 1326   CREATININE 1.37* 10/29/2014 1326   CREATININE 0.86 12/25/2010 1004   CALCIUM 9.1 10/29/2014 1326   PROT 7.4 10/03/2012 1355   ALBUMIN 3.9 10/03/2012 1355   AST 15 10/03/2012 1355   ALT 12 10/03/2012 1355   ALKPHOS 102 10/03/2012 1355   BILITOT 0.6 10/03/2012 1355   GFRNONAA 77* 12/25/2010 1004   GFRAA 90* 12/25/2010 1004    No results found for: WBC, HGB, HCT, MCV  Lipid Panel  No results found for: CHOL, TRIG, HDL, CHOLHDL, VLDL, LDLCALC, LDLDIRECT  ABG No results found for: PHART, PCO2ART, PO2ART, HCO3, TCO2, ACIDBASEDEF, O2SAT   No results found for: TSH BNP (last 3 results) No results for input(s): BNP in the last 8760 hours.  ProBNP (last 3 results) No results for input(s): PROBNP in the last 8760 hours.  Cardiac Panel (last 3 results) No  results for input(s): CKTOTAL, CKMB, TROPONINI, RELINDX in the last 72 hours.  Iron/TIBC/Ferritin/ %Sat No results found for: IRON, TIBC, FERRITIN, IRONPCTSAT   EKG Orders placed or performed in visit on 11/16/14  . EKG 12-Lead     Prior Assessment and Plan Problem List as of 04/29/2015  Cardiovascular and Mediastinum   Non-ischemic cardiomyopathy (HCC)   HTN (hypertension)   Last Assessment & Plan 06/22/2014 Office Visit Written 06/22/2014 10:38 AM by Marinus Maw, MD    Her blood pressure is well controlled today. No change in meds. She is encouraged to lose weight.      Stroke [facility])   Acute on chronic systolic congestive heart failure, NYHA class 2 [facility])   Last Assessment & Plan 06/22/2014 Office Visit Written 06/22/2014 10:37 AM by Marinus Maw, MD    Her symptoms are well compensated. She will continue her current meds. I have encouraged her to reduce her sodium intake, lose weight and continue her current meds.        Endocrine   Diabetes mellitus with stage 3 chronic kidney disease (HCC)     Musculoskeletal and Integument   Fibromyalgia     Other   AICD (automatic cardioverter/defibrillator) present   Last Assessment & Plan 06/22/2014 Office Visit Written 06/22/2014 10:39 AM by Marinus Maw, MD    Her medtronic ICD is working normally. Will recheck in several months. She has had NSVT.      Dyslipidemia   Lupus (HCC)   Personal history of noncompliance with medical treatment, presenting hazards to health       Imaging: No results found.

## 2015-05-01 ENCOUNTER — Telehealth: Payer: Self-pay | Admitting: *Deleted

## 2015-05-01 NOTE — Telephone Encounter (Signed)
PA started with Katherine [name]. PA # [id number] Call Reference #

## 2015-05-23 ENCOUNTER — Ambulatory Visit (INDEPENDENT_AMBULATORY_CARE_PROVIDER_SITE_OTHER): Payer: Medicaid Other | Admitting: Internal Medicine

## 2015-05-23 ENCOUNTER — Encounter: Payer: Self-pay | Admitting: Internal Medicine

## 2015-05-23 VITALS — BP 138/80 | HR 95 | Ht 70.5 in | Wt 279.0 lb

## 2015-05-23 DIAGNOSIS — Z95810 Presence of automatic (implantable) cardiac defibrillator: Secondary | ICD-10-CM | POA: Diagnosis not present

## 2015-05-23 DIAGNOSIS — I5022 Chronic systolic (congestive) heart failure: Secondary | ICD-10-CM | POA: Diagnosis not present

## 2015-05-23 NOTE — Patient Instructions (Signed)
Your physician wants you to follow-up in: 1 Year with [name]. You will receive a reminder letter in the mail two months in advance. If you don't receive a letter, please call our office to schedule the follow-up appointment.  Remote monitoring is used to monitor your Pacemaker of ICD from home. This monitoring reduces the number of office visits required to check your device to one time per year. It allows [place] to keep an eye on the functioning of your device to ensure it is working properly. You are scheduled for a device check from home on 08/22/15. You may send your transmission at any time that day. If you have a wireless device, the transmission will be sent automatically. After your physician reviews your transmission, you will receive a postcard with your next transmission date.  Your physician recommends that you continue on your current medications as directed. Please refer to the Current Medication list given to you today.

## 2015-05-23 NOTE — Progress Notes (Signed)
HPI Katherine [name] returns today for followup. She is a pleasant 55 yo woman with a non-ischemic CM, chronic systolic heart failure, SLE, fibromyalgia, HTN,and obesity. She is s/p ICD implantation and returns today for ongoing ICD evaluation and management. She denies syncope or ICD shock. She admits to being sedentary. Allergies  Allergen Reactions  . Penicillins Itching    Can take amoxicillin without a reaction  . Shellfish Allergy Other (See Comments)    Causes to have grand mal seizures     Current Outpatient Prescriptions  Medication Sig Dispense Refill  . albuterol (PROVENTIL HFA;VENTOLIN HFA) 108 (90 BASE) MCG/ACT inhaler Inhale 2 puffs into the lungs every 6 (six) hours as needed. For wheezing and shortness of breath    . albuterol (PROVENTIL) (2.5 MG/3ML) 0.083% nebulizer solution Take 3 mLs (2.5 mg total) by nebulization every 6 (six) hours as needed. For wheezing and shortness of breath 75 mL 3  . aspirin EC 81 MG tablet Take 81 mg by mouth daily.      . cetirizine (ZYRTEC) 10 MG tablet Take 10 mg by mouth as needed for allergies.    . clobetasol cream (TEMOVATE) 0.05 % Apply 1 application topically 2 (two) times daily as needed.    . cloNIDine (CATAPRES) 0.2 MG tablet Take 1 tablet (0.2 mg total) by mouth 2 (two) times daily. 60 tablet 6  . Fluticasone-Salmeterol (ADVAIR) 500-50 MCG/DOSE AEPB Inhale 2 puffs into the lungs 2 (two) times daily. 60 each 0  . furosemide (LASIX) 40 MG tablet Take 1 tablet (40 mg total) by mouth 2 (two) times daily. Will take 1 extra dose based on edema 90 tablet 2  . gabapentin (NEURONTIN) 300 MG capsule Take 300 mg by mouth 3 (three) times daily.      . hydroxychloroquine (PLAQUENIL) 200 MG tablet Take 200 mg by mouth 2 (two) times daily.      Marland Kitchen ibuprofen (ADVIL,MOTRIN) 600 MG tablet Take 600 mg by mouth every 8 (eight) hours as needed.    . Insulin Lispro Prot & Lispro (HUMALOG MIX 50/50 KWIKPEN) (50-50) 100 UNIT/ML Kwikpen Inject 20 Units  into the skin 2 (two) times daily with a meal. 15 mL 2  . Insulin Pen Needle (B-D ULTRAFINE III SHORT PEN) 31G X 8 MM MISC 1 each by Does not apply route as directed. 100 each 3  . losartan (COZAAR) 100 MG tablet Take 1 tablet (100 mg total) by mouth daily. 90 tablet 3  . meloxicam (MOBIC) 7.5 MG tablet Take 7.5 mg by mouth 2 (two) times daily as needed for pain.    . metolazone (ZAROXOLYN) 5 MG tablet Take 1 tablet (5 mg total) by mouth every other day. Take Monday, Wednesday, Friday (Patient taking differently: Take 5 mg by mouth as directed. Take Monday, Wednesday, Friday) 30 tablet 9  . metoprolol succinate (TOPROL-XL) 100 MG 24 hr tablet Take 1 tablet (100 mg total) by mouth daily. Take with or immediately following a meal. 90 tablet 3  . pimecrolimus (ELIDEL) 1 % cream Apply 1 application topically daily.    . promethazine (PHENERGAN) 25 MG tablet Take 25 mg by mouth every 8 (eight) hours as needed. nausea     . simvastatin (ZOCOR) 20 MG tablet Take 1 tablet (20 mg total) by mouth at bedtime. 90 tablet 3  . spironolactone (ALDACTONE) 100 MG tablet Take 1 tablet (100 mg total) by mouth daily. 90 tablet 3  . venlafaxine XR (EFFEXOR-XR) 150 MG 24  hr capsule Take 150 mg by mouth daily.      No current facility-administered medications for this visit.     Past Medical History  Diagnosis Date  . Angina   . Asthma   . Shortness of breath   . Bronchitis   . Lupus (HCC)   . Seizures (HCC)     per patient statement  . Hypertension   . Stroke (HCC)   . CHF (congestive heart failure) (HCC)   . Pneumonia   . GERD (gastroesophageal reflux disease)   . Headache(784.0)   . Diabetes mellitus   . Fibromyalgia   . Nonischemic cardiomyopathy (HCC)     NYHA classII  . Fibromyalgia 10/03/2012    ROS:   All systems reviewed and negative except as noted in the HPI.   Past Surgical History  Procedure Laterality Date  . Icd  02/13/2011    Medtronic Protecta DR XT  . Tubal ligation    .  Right ankle    . US echocardiography  11/09/2010    mild LV enlargement w/conc. LVH & severe global hypokinesis EF 30-35%,mod. diastolic dysfunction, mod. TR,mild AI,mild to mod MR,mild PI  . Left heart catheterization with coronary angiogram N/A 12/05/2010    Procedure: LEFT HEART CATHETERIZATION WITH CORONARY ANGIOGRAM;  Surgeon: Chrystie Nose, MD;  Location: [facility] CATH LAB;  Service: Cardiovascular;  Laterality: N/A;  . Implantable cardioverter defibrillator implant N/A 02/13/2011    Procedure: IMPLANTABLE CARDIOVERTER DEFIBRILLATOR IMPLANT;  Surgeon: Thurmon Fair, MD;  Location: [facility] CATH LAB;  Service: Cardiovascular;  Laterality: N/A;     No family history on file.   Social History   Social History  . Marital Status: Legally Separated    Spouse Name: N/A  . Number of Children: N/A  . Years of Education: N/A   Occupational History  . Not on file.   Social History Main Topics  . Smoking status: Former Smoker -- 1.00 packs/day for 26 years    Types: Cigarettes    Start date: 07/01/1979    Quit date: 10/03/2005  . Smokeless tobacco: Never Used  . Alcohol Use: 0.0 oz/week    0 Standard drinks or equivalent per week     Comment: quit 2007  . Drug Use: No  . Sexual Activity: Yes   Other Topics Concern  . Not on file   Social History Narrative     BP 138/80 mmHg  Pulse 95  Ht 5' 10.5" (1.791 m)  Wt 279 lb (126.554 kg)  BMI 39.45 kg/m2  SpO2 98%  Physical Exam:  obese appearing 55 yo woman, NAD HEENT: Unremarkable Neck:  7 cm JVD, no thyromegally Back:  No CVA tenderness Lungs:  Clear with no wheezes, well healed ICD incision. HEART:  Regular rate rhythm, no murmurs, no rubs, no clicks Abd:  soft, obese, positive bowel sounds, no organomegally, no rebound, no guarding Ext:  2 plus pulses, no edema, no cyanosis, no clubbing Skin:  No rashes no nodules Neuro:  CN II through XII intact, motor grossly intact   DEVICE  Normal device function.  See PaceArt for  details.   Assess/Plan: 1. Chronic systolic heart failure - her symptoms are well compensated class 2. She will continue her current meds and maintain a low sodium diet. 2. ICD - Her medtronic DDD ICD is working normally.  3. Obesity - she is not inclined to try and lose weight.  Lewayne Bunting, M.D.

## 2015-06-10 LAB — CUP PACEART INCLINIC DEVICE CHECK
Brady Statistic AS VS Percent: 97.15 %
HIGH POWER IMPEDANCE MEASURED VALUE: 42 Ohm
HighPow Impedance: 304 Ohm
HighPow Impedance: 53 Ohm
Implantable Lead Implant Date: 20130125
Implantable Lead Location: 753859
Implantable Lead Model: 5076
Implantable Lead Model: 6947
Lead Channel Impedance Value: 361 Ohm
Lead Channel Impedance Value: 418 Ohm
Lead Channel Pacing Threshold Amplitude: 0.5 V
Lead Channel Sensing Intrinsic Amplitude: 2.75 mV
Lead Channel Setting Pacing Pulse Width: 0.4 ms
MDC IDC LEAD IMPLANT DT: 20130125
MDC IDC LEAD LOCATION: 753860
MDC IDC MSMT BATTERY VOLTAGE: 2.96 V
MDC IDC MSMT LEADCHNL RA PACING THRESHOLD PULSEWIDTH: 0.4 ms
MDC IDC MSMT LEADCHNL RV PACING THRESHOLD AMPLITUDE: 0.75 V
MDC IDC MSMT LEADCHNL RV PACING THRESHOLD PULSEWIDTH: 0.4 ms
MDC IDC MSMT LEADCHNL RV SENSING INTR AMPL: 11 mV
MDC IDC SESS DTM: 20170504195623
MDC IDC SET LEADCHNL RA PACING AMPLITUDE: 2 V
MDC IDC SET LEADCHNL RV PACING AMPLITUDE: 2.5 V
MDC IDC SET LEADCHNL RV SENSING SENSITIVITY: 0.6 mV
MDC IDC STAT BRADY AP VP PERCENT: 0.01 %
MDC IDC STAT BRADY AP VS PERCENT: 2.82 %
MDC IDC STAT BRADY AS VP PERCENT: 0.03 %
MDC IDC STAT BRADY RA PERCENT PACED: 2.82 %
MDC IDC STAT BRADY RV PERCENT PACED: 0.04 %

## 2015-06-26 ENCOUNTER — Other Ambulatory Visit: Payer: Self-pay | Admitting: "Endocrinology

## 2015-07-01 ENCOUNTER — Other Ambulatory Visit: Payer: Self-pay

## 2015-07-01 MED ORDER — FUROSEMIDE 40 MG PO TABS: 40.0000 mg | ORAL_TABLET | Freq: Two times a day (BID) | ORAL | Status: DC

## 2015-07-01 NOTE — Telephone Encounter (Signed)
Refill complete 

## 2015-07-11 ENCOUNTER — Ambulatory Visit: Payer: Medicaid Other | Admitting: "Endocrinology

## 2015-07-24 ENCOUNTER — Ambulatory Visit: Payer: Medicaid Other | Admitting: "Endocrinology

## 2015-08-22 ENCOUNTER — Other Ambulatory Visit: Payer: Self-pay | Admitting: "Endocrinology

## 2015-08-22 ENCOUNTER — Encounter: Payer: Medicaid Other | Admitting: *Deleted

## 2015-08-22 LAB — BASIC METABOLIC PANEL
BUN: 26 mg/dL — AB (ref 7–25)
CHLORIDE: 103 mmol/L (ref 98–110)
CO2: 24 mmol/L (ref 20–31)
CREATININE: 1.75 mg/dL — AB (ref 0.50–1.05)
Calcium: 9.7 mg/dL (ref 8.6–10.4)
Glucose, Bld: 143 mg/dL — ABNORMAL HIGH (ref 65–99)
POTASSIUM: 4.7 mmol/L (ref 3.5–5.3)
Sodium: 139 mmol/L (ref 135–146)

## 2015-08-23 ENCOUNTER — Encounter: Payer: Self-pay | Admitting: Cardiology

## 2015-08-23 ENCOUNTER — Telehealth: Payer: Self-pay | Admitting: Cardiology

## 2015-08-23 LAB — HEMOGLOBIN A1C
Hgb A1c MFr Bld: 8.7 % — ABNORMAL HIGH (ref <5.7)
MEAN PLASMA GLUCOSE: 203 mg/dL

## 2015-08-23 NOTE — Telephone Encounter (Signed)
LMOVM reminding pt to send remote transmission.   

## 2015-09-24 ENCOUNTER — Ambulatory Visit (INDEPENDENT_AMBULATORY_CARE_PROVIDER_SITE_OTHER): Payer: Medicaid Other | Admitting: "Endocrinology

## 2015-09-24 ENCOUNTER — Encounter: Payer: Self-pay | Admitting: "Endocrinology

## 2015-09-24 VITALS — BP 154/99 | HR 83 | Ht 70.5 in | Wt 283.0 lb

## 2015-09-24 DIAGNOSIS — N183 Chronic kidney disease, stage 3 unspecified: Secondary | ICD-10-CM

## 2015-09-24 DIAGNOSIS — E785 Hyperlipidemia, unspecified: Secondary | ICD-10-CM

## 2015-09-24 DIAGNOSIS — I10 Essential (primary) hypertension: Secondary | ICD-10-CM

## 2015-09-24 DIAGNOSIS — E1122 Type 2 diabetes mellitus with diabetic chronic kidney disease: Secondary | ICD-10-CM | POA: Diagnosis not present

## 2015-09-24 MED ORDER — INSULIN LISPRO PROT & LISPRO (50-50 MIX) 100 UNIT/ML KWIKPEN: 30.0000 [IU] | PEN_INJECTOR | Freq: Two times a day (BID) | SUBCUTANEOUS | 3 refills | Status: DC

## 2015-09-24 NOTE — Progress Notes (Signed)
Subjective:    Patient ID: Katherine [name], female    DOB: 09/22/1960,    Past Medical History:  Diagnosis Date  . Angina   . Asthma   . Bronchitis   . CHF (congestive heart failure) (HCC)   . Diabetes mellitus   . Fibromyalgia   . Fibromyalgia 10/03/2012  . GERD (gastroesophageal reflux disease)   . Headache(784.0)   . Hypertension   . Lupus (HCC)   . Nonischemic cardiomyopathy (HCC)    NYHA classII  . Pneumonia   . Seizures (HCC)    per patient statement  . Shortness of breath   . Stroke [facility])    Past Surgical History:  Procedure Laterality Date  . ICD  02/13/2011   Medtronic Protecta DR XT  . IMPLANTABLE CARDIOVERTER DEFIBRILLATOR IMPLANT N/A 02/13/2011   Procedure: IMPLANTABLE CARDIOVERTER DEFIBRILLATOR IMPLANT;  Surgeon: Thurmon Fair, MD;  Location: [facility] CATH LAB;  Service: Cardiovascular;  Laterality: N/A;  . LEFT HEART CATHETERIZATION WITH CORONARY ANGIOGRAM N/A 12/05/2010   Procedure: LEFT HEART CATHETERIZATION WITH CORONARY ANGIOGRAM;  Surgeon: Chrystie Nose, MD;  Location: [facility] CATH LAB;  Service: Cardiovascular;  Laterality: N/A;  . RIGHT ANKLE    . TUBAL LIGATION    . US ECHOCARDIOGRAPHY  11/09/2010   mild LV enlargement w/conc. LVH & severe global hypokinesis EF 30-35%,mod. diastolic dysfunction, mod. TR,mild AI,mild to mod MR,mild PI   Social History   Social History  . Marital status: Legally Separated    Spouse name: N/A  . Number of children: N/A  . Years of education: N/A   Social History Main Topics  . Smoking status: Former Smoker    Packs/day: 1.00    Years: 26.00    Types: Cigarettes    Start date: 07/01/1979    Quit date: 10/03/2005  . Smokeless tobacco: Never Used  . Alcohol use 0.0 oz/week     Comment: quit 2007  . Drug use: No  . Sexual activity: Yes   Other Topics Concern  . None   Social History Narrative  . None   Outpatient Encounter Prescriptions as of 09/24/2015  Medication Sig  . albuterol (PROVENTIL HFA;VENTOLIN  HFA) 108 (90 BASE) MCG/ACT inhaler Inhale 2 puffs into the lungs every 6 (six) hours as needed. For wheezing and shortness of breath  . albuterol (PROVENTIL) (2.5 MG/3ML) 0.083% nebulizer solution Take 3 mLs (2.5 mg total) by nebulization every 6 (six) hours as needed. For wheezing and shortness of breath  . aspirin EC 81 MG tablet Take 81 mg by mouth daily.    . cetirizine (ZYRTEC) 10 MG tablet Take 10 mg by mouth as needed for allergies.  . clobetasol cream (TEMOVATE) 0.05 % Apply 1 application topically 2 (two) times daily as needed.  . cloNIDine (CATAPRES) 0.2 MG tablet Take 1 tablet (0.2 mg total) by mouth 2 (two) times daily.  . Fluticasone-Salmeterol (ADVAIR) 500-50 MCG/DOSE AEPB Inhale 2 puffs into the lungs 2 (two) times daily.  . furosemide (LASIX) 40 MG tablet Take 1 tablet (40 mg total) by mouth 2 (two) times daily. Will take 1 extra dose based on edema  . gabapentin (NEURONTIN) 300 MG capsule Take 300 mg by mouth 3 (three) times daily.    . hydroxychloroquine (PLAQUENIL) 200 MG tablet Take 200 mg by mouth 2 (two) times daily.    Marland Kitchen ibuprofen (ADVIL,MOTRIN) 600 MG tablet Take 600 mg by mouth every 8 (eight) hours as needed.  . Insulin Lispro Prot & Lispro (HUMALOG  MIX 50/50 KWIKPEN) (50-50) 100 UNIT/ML Kwikpen Inject 30 Units into the skin 2 (two) times daily before a meal.  . Insulin Pen Needle (B-D ULTRAFINE III SHORT PEN) 31G X 8 MM MISC 1 each by Does not apply route as directed.  Marland Kitchen. losartan (COZAAR) 100 MG tablet Take 1 tablet (100 mg total) by mouth daily.  . meloxicam (MOBIC) 7.5 MG tablet Take 7.5 mg by mouth 2 (two) times daily as needed for pain.  . metolazone (ZAROXOLYN) 5 MG tablet Take 1 tablet (5 mg total) by mouth every other day. Take Monday, Wednesday, Friday (Patient taking differently: Take 5 mg by mouth as directed. Take Monday, Wednesday, Friday)  . metoprolol succinate (TOPROL-XL) 100 MG 24 hr tablet Take 1 tablet (100 mg total) by mouth daily. Take with or  immediately following a meal.  . pimecrolimus (ELIDEL) 1 % cream Apply 1 application topically daily.  . promethazine (PHENERGAN) 25 MG tablet Take 25 mg by mouth every 8 (eight) hours as needed. nausea   . simvastatin (ZOCOR) 20 MG tablet Take 1 tablet (20 mg total) by mouth at bedtime.  Marland Kitchen. spironolactone (ALDACTONE) 100 MG tablet Take 1 tablet (100 mg total) by mouth daily.  Marland Kitchen. venlafaxine XR (EFFEXOR-XR) 150 MG 24 hr capsule Take 150 mg by mouth daily.   . [DISCONTINUED] HUMALOG MIX 50/50 KWIKPEN (50-50) 100 UNIT/ML Kwikpen INJECT 20 UNITS SUBCUTANEOUSLY TWICE A DAY WITH A MEAL   No facility-administered encounter medications on file as of 09/24/2015.    ALLERGIES: Allergies  Allergen Reactions  . Penicillins Itching    Can take amoxicillin without a reaction  . Shellfish Allergy Other (See Comments)    Causes to have grand mal seizures   VACCINATION STATUS:  There is no immunization history on file for this patient.  Diabetes  She presents for her follow-up diabetic visit. She has type 2 diabetes mellitus. Onset time: She was diagnosed at approximate age of 55 years. Her disease course has been improving. Pertinent negatives for hypoglycemia include no confusion, headaches, pallor or seizures. Associated symptoms include fatigue. Pertinent negatives for diabetes include no chest pain, no polydipsia, no polyphagia and no polyuria. There are no hypoglycemic complications. Symptoms are improving. Diabetic complications include a CVA and nephropathy. Risk factors for coronary artery disease include diabetes mellitus, dyslipidemia, obesity, sedentary lifestyle, post-menopausal and hypertension. She is compliant with treatment none of the time. Her weight is increasing steadily. She is following a generally unhealthy diet. She has not had a previous visit with a dietitian. She never participates in exercise. Home blood sugar record trend: She brought her log  showing better  blood glucose readings  or between 150 - 160. Her breakfast blood glucose range is generally 180-200 mg/dl. Her lunch blood glucose range is generally 180-200 mg/dl. Her dinner blood glucose range is generally 180-200 mg/dl. Her overall blood glucose range is 180-200 mg/dl. An ACE inhibitor/angiotensin II receptor blocker is being taken.  Hypertension  This is a chronic problem. The current episode started more than 1 year ago. The problem is controlled. Pertinent negatives include no chest pain, headaches, palpitations or shortness of breath. Risk factors for coronary artery disease include dyslipidemia, diabetes mellitus, obesity and sedentary lifestyle. Past treatments include angiotensin blockers. Hypertensive end-organ damage includes kidney disease and CVA.  Hyperlipidemia  This is a chronic problem. The current episode started more than 1 year ago. Pertinent negatives include no chest pain, myalgias or shortness of breath. Current antihyperlipidemic treatment includes statins.  Review of Systems  Constitutional: Positive for fatigue. Negative for unexpected weight change.  HENT: Negative for trouble swallowing and voice change.   Eyes: Negative for visual disturbance.  Respiratory: Negative for cough, shortness of breath and wheezing.   Cardiovascular: Negative for chest pain, palpitations and leg swelling.  Gastrointestinal: Negative for diarrhea, nausea and vomiting.  Endocrine: Negative for cold intolerance, heat intolerance, polydipsia, polyphagia and polyuria.  Musculoskeletal: Negative for arthralgias and myalgias.  Skin: Negative for color change, pallor, rash and wound.  Neurological: Negative for seizures and headaches.  Psychiatric/Behavioral: Negative for confusion and suicidal ideas.    Objective:    BP (!) 154/99   Pulse 83   Ht 5' 10.5" (1.791 m)   Wt 283 lb (128.4 kg)   BMI 40.03 kg/m   Wt Readings from Last 3 Encounters:  09/24/15 283 lb (128.4 kg)  05/23/15 279 lb (126.6 kg)   04/29/15 274 lb (124.3 kg)    Physical Exam  Constitutional: She is oriented to person, place, and time. She appears well-developed.  HENT:  Head: Normocephalic and atraumatic.  Eyes: EOM are normal.  Neck: Normal range of motion. Neck supple. No tracheal deviation present. No thyromegaly present.  Cardiovascular: Normal rate and regular rhythm.   Pulmonary/Chest: Effort normal and breath sounds normal.  Abdominal: Soft. Bowel sounds are normal. There is no tenderness. There is no guarding.  Musculoskeletal: Normal range of motion. She exhibits no edema.  Neurological: She is alert and oriented to person, place, and time. She has normal reflexes. No cranial nerve deficit. Coordination normal.  Skin: Skin is warm and dry. No rash noted. No erythema. No pallor.  Psychiatric: She has a normal mood and affect. Judgment normal.  She has reluctant noncompliant affect.    Results for orders placed or performed in visit on 08/22/15  Basic metabolic panel  Result Value Ref Range   Sodium 139 135 - 146 mmol/L   Potassium 4.7 3.5 - 5.3 mmol/L   Chloride 103 98 - 110 mmol/L   CO2 24 20 - 31 mmol/L   Glucose, Bld 143 (H) 65 - 99 mg/dL   BUN 26 (H) 7 - 25 mg/dL   Creat 9.60 (H) 4.54 - 1.05 mg/dL   Calcium 9.7 8.6 - 09.8 mg/dL  Hemoglobin J1B  Result Value Ref Range   Hgb A1c MFr Bld 8.7 (H) <5.7 %   Mean Plasma Glucose 203 mg/dL   Complete Blood Count (Most recent): No results found for: WBC, HGB, HCT, MCV, PLT Chemistry (most recent): Lab Results  Component Value Date   NA 139 08/22/2015   K 4.7 08/22/2015   CL 103 08/22/2015   CO2 24 08/22/2015   BUN 26 (H) 08/22/2015   CREATININE 1.75 (H) 08/22/2015   Diabetic Labs (most recent): Lab Results  Component Value Date   HGBA1C 8.7 (H) 08/22/2015   HGBA1C 11.4 (A) 09/26/2014     Assessment & Plan:   1. Diabetes mellitus with stage 3 chronic kidney disease (HCC)   Her  diabetes is  complicated by chronic kidney disease and  questionable CVA and patient remains at a high risk for more acute and chronic complications of diabetes which include CAD, CVA, CKD, retinopathy, and neuropathy. These are all discussed in detail with the patient.  Patient came with Better  blood glucose readings .     - I have re-counseled the patient on diet management and weight loss  by adopting a carbohydrate restricted / protein rich  Diet.  - Suggestion is made for patient to avoid simple carbohydrates   from their diet including Cakes , Desserts, Ice Cream,  Soda (  diet and regular) , Sweet Tea , Candies,  Chips, Cookies, Artificial Sweeteners,   and "Sugar-free" Products .  This will help patient to have stable blood glucose profile and potentially avoid unintended  Weight gain.  - Patient is advised to stick to a routine mealtimes to eat 3 meals  a day and avoid unnecessary snacks ( to snack only to correct hypoglycemia).  - The patient  has been  scheduled with Norm Salt, RDN, CDE for individualized DM education.  - I have approached patient with the following individualized plan to manage diabetes and patient agrees.  -She is engaged and doing much better than before.  -She has  recent A1c of 8.7% improving from 11.4 %.   -She is approached for insulin therapy associated with strict monitoring of blood glucose and she agrees. -I will  increase  Humalog 50/50  To 30 units with breakfast and 30 units with supper for blood glucose above 90 mg/dL. -Written instructions given.   -Patient is encouraged to call clinic for blood glucose levels less than 70 or above 300 mg /dl. -She is not a candidate for Invokana, metformin due to advanced CKD. -I will  Discontinue amaryl .  - Patient specific target  for A1c; LDL, HDL, Triglycerides, and  Waist Circumference were discussed in detail.  2) BP/HTN: Controlled. Continue current medications including ACEI/ARB. 3) Lipids/HPL:  continue statins. 4)  Weight/Diet: CDE consult in  progress, exercise, and carbohydrates information provided.  5)  Personal history of noncompliance with medical treatment, presenting hazards to health -I counseled this patient for 20 minutes.  6) Chronic Care/Health Maintenance:  -Patient  on ACEI/ARB and Statin medications and encouraged to continue to follow up with Ophthalmology, Podiatrist at least yearly or according to recommendations, and advised to  stay away from smoking. I have recommended yearly flu vaccine and pneumonia vaccination at least every 5 years; moderate intensity exercise for up to 150 minutes weekly; and  sleep for at least 7 hours a day.  I advised patient to maintain close follow up with their PCP for primary care needs.  Patient is asked to bring meter and  blood glucose logs during their next visit.   Follow up plan: Return in about 3 months (around 12/24/2015) for follow up with pre-visit labs, meter, and logs.  Marquis Lunch, MD Phone: [phone]  Fax: [phone]   09/24/2015, 12:02 PM

## 2015-09-24 NOTE — Patient Instructions (Signed)

## 2015-10-17 ENCOUNTER — Other Ambulatory Visit: Payer: Self-pay

## 2015-10-17 MED ORDER — METOPROLOL SUCCINATE ER 100 MG PO TB24: 100.0000 mg | ORAL_TABLET | Freq: Every day | ORAL | 3 refills | Status: DC

## 2015-10-17 NOTE — Telephone Encounter (Signed)
Refilled metoprolol 100 mg daily 90 day with refillx 3

## 2015-10-31 ENCOUNTER — Other Ambulatory Visit: Payer: Self-pay | Admitting: Cardiovascular Disease

## 2015-11-08 ENCOUNTER — Other Ambulatory Visit: Payer: Self-pay | Admitting: "Endocrinology

## 2015-11-25 ENCOUNTER — Other Ambulatory Visit: Payer: Self-pay | Admitting: Cardiovascular Disease

## 2015-12-23 ENCOUNTER — Encounter: Payer: Self-pay | Admitting: *Deleted

## 2015-12-27 ENCOUNTER — Ambulatory Visit: Payer: Medicaid Other | Admitting: "Endocrinology

## 2015-12-30 ENCOUNTER — Other Ambulatory Visit: Payer: Self-pay | Admitting: "Endocrinology

## 2015-12-30 ENCOUNTER — Encounter: Payer: Self-pay | Admitting: *Deleted

## 2015-12-30 ENCOUNTER — Encounter: Payer: Medicaid Other | Admitting: Obstetrics and Gynecology

## 2016-01-28 ENCOUNTER — Other Ambulatory Visit: Payer: Self-pay | Admitting: "Endocrinology

## 2016-02-12 ENCOUNTER — Other Ambulatory Visit: Payer: Self-pay | Admitting: "Endocrinology

## 2016-02-25 ENCOUNTER — Other Ambulatory Visit: Payer: Self-pay | Admitting: Adult Health

## 2016-04-03 ENCOUNTER — Other Ambulatory Visit: Payer: Self-pay | Admitting: Adult Health

## 2016-04-16 ENCOUNTER — Other Ambulatory Visit: Payer: Self-pay | Admitting: "Endocrinology

## 2016-04-23 ENCOUNTER — Other Ambulatory Visit: Payer: Self-pay | Admitting: Adult Health

## 2016-04-30 ENCOUNTER — Ambulatory Visit (INDEPENDENT_AMBULATORY_CARE_PROVIDER_SITE_OTHER): Payer: Medicaid Other | Admitting: *Deleted

## 2016-04-30 ENCOUNTER — Ambulatory Visit (INDEPENDENT_AMBULATORY_CARE_PROVIDER_SITE_OTHER): Payer: Medicaid Other | Admitting: Adult Health

## 2016-04-30 ENCOUNTER — Encounter: Payer: Self-pay | Admitting: Adult Health

## 2016-04-30 VITALS — BP 102/70 | HR 85 | Ht 71.0 in | Wt 292.0 lb

## 2016-04-30 DIAGNOSIS — N183 Chronic kidney disease, stage 3 unspecified: Secondary | ICD-10-CM

## 2016-04-30 DIAGNOSIS — I5022 Chronic systolic (congestive) heart failure: Secondary | ICD-10-CM

## 2016-04-30 DIAGNOSIS — Z95810 Presence of automatic (implantable) cardiac defibrillator: Secondary | ICD-10-CM | POA: Diagnosis not present

## 2016-04-30 DIAGNOSIS — I428 Other cardiomyopathies: Secondary | ICD-10-CM

## 2016-04-30 DIAGNOSIS — I10 Essential (primary) hypertension: Secondary | ICD-10-CM | POA: Diagnosis not present

## 2016-04-30 DIAGNOSIS — E785 Hyperlipidemia, unspecified: Secondary | ICD-10-CM | POA: Diagnosis not present

## 2016-04-30 DIAGNOSIS — E1122 Type 2 diabetes mellitus with diabetic chronic kidney disease: Secondary | ICD-10-CM

## 2016-04-30 LAB — CUP PACEART INCLINIC DEVICE CHECK
Brady Statistic AP VP Percent: 0.01 %
Brady Statistic AS VP Percent: 0.03 %
Brady Statistic RA Percent Paced: 3.41 %
Date Time Interrogation Session: 20180412153413
HIGH POWER IMPEDANCE MEASURED VALUE: 285 Ohm
HighPow Impedance: 39 Ohm
HighPow Impedance: 48 Ohm
Implantable Lead Implant Date: 20130125
Implantable Lead Location: 753860
Implantable Lead Model: 5076
Implantable Lead Model: 6947
Lead Channel Impedance Value: 361 Ohm
Lead Channel Pacing Threshold Amplitude: 0.5 V
Lead Channel Pacing Threshold Amplitude: 0.625 V
Lead Channel Pacing Threshold Pulse Width: 0.4 ms
Lead Channel Sensing Intrinsic Amplitude: 2.625 mV
Lead Channel Sensing Intrinsic Amplitude: 3.125 mV
Lead Channel Sensing Intrinsic Amplitude: 8.25 mV
Lead Channel Setting Pacing Pulse Width: 0.4 ms
MDC IDC LEAD IMPLANT DT: 20130125
MDC IDC LEAD LOCATION: 753859
MDC IDC MSMT BATTERY VOLTAGE: 2.68 V
MDC IDC MSMT LEADCHNL RA IMPEDANCE VALUE: 399 Ohm
MDC IDC MSMT LEADCHNL RV PACING THRESHOLD PULSEWIDTH: 0.4 ms
MDC IDC MSMT LEADCHNL RV SENSING INTR AMPL: 10.875 mV
MDC IDC PG IMPLANT DT: 20130125
MDC IDC SET LEADCHNL RA PACING AMPLITUDE: 2 V
MDC IDC SET LEADCHNL RV PACING AMPLITUDE: 2.5 V
MDC IDC SET LEADCHNL RV SENSING SENSITIVITY: 0.6 mV
MDC IDC STAT BRADY AP VS PERCENT: 3.42 %
MDC IDC STAT BRADY AS VS PERCENT: 96.55 %
MDC IDC STAT BRADY RV PERCENT PACED: 0.04 %

## 2016-04-30 MED ORDER — LOSARTAN POTASSIUM 100 MG PO TABS: 100.0000 mg | ORAL_TABLET | Freq: Every day | ORAL | 3 refills | Status: DC

## 2016-04-30 MED ORDER — METOPROLOL SUCCINATE ER 100 MG PO TB24: 100.0000 mg | ORAL_TABLET | Freq: Every day | ORAL | 3 refills | Status: DC

## 2016-04-30 MED ORDER — CLONIDINE HCL 0.2 MG PO TABS: 0.2000 mg | ORAL_TABLET | Freq: Two times a day (BID) | ORAL | 3 refills | Status: DC

## 2016-04-30 MED ORDER — SPIRONOLACTONE 100 MG PO TABS: 100.0000 mg | ORAL_TABLET | Freq: Every day | ORAL | 3 refills | Status: DC

## 2016-04-30 NOTE — Patient Instructions (Addendum)
Your physician wants you to follow-up in: 6 Months with Dr. Koneswaran. You will receive a reminder letter in the mail two months in advance. If you don't receive a letter, please call our office to schedule the follow-up appointment.  Your physician recommends that you continue on your current medications as directed. Please refer to the Current Medication list given to you today.  Your physician recommends that you return for lab work in: Today  If you need a refill on your cardiac medications before your next appointment, please call your pharmacy.  Thank you for choosing [facility]!    

## 2016-04-30 NOTE — Progress Notes (Signed)
ICD check in clinic. Normal device function. Thresholds and sensing consistent with previous device measurements. Impedance trends stable over time. 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 No mode switches. Histogram distribution appropriate for patient and level of activity. Stable thoracic impedance. No changes made this session. Device programmed at appropriate safety margins. Device programmed to optimize intrinsic conduction. Batt voltage 2.68V (ERI 2.63V), patient aware. Pt enrolled in remote follow-up. Plan to follow up with GT on 5/7 @1345 . Patient education completed including shock plan. Alert tones demonstrated for patient.

## 2016-04-30 NOTE — Progress Notes (Signed)
Cardiology Office Note   Date:  04/30/2016   ID:  Katherine [name], DOB 05-07-1960, MRN [id number]  PCP:  Alvina Filbert, MD  Cardiologist: Inis Sizer, NP   Chief Complaint  Patient presents with  . Cardiomyopathy  . Congestive Heart Failure  . Follow-up      History of Present Illness: Katherine [name] is a 56 y.o. female who presents for ongoing assessment and management of nonischemic cardiomyopathy, EF of 28%, chronic systolic heart failure, hypertension, with other history to include SLE, fibromyalgia, and obesity. The patient has an ICD in situ and is followed by [name], and has seen him on most recent office visit on 05/23/2015. The patient was last seen by [name] on 04/29/2015. On that visit the patient was stable from a cardiac standpoint, blood pressure was controlled on multiple medications.   She is here today with some depression. She states that lupus and diabetes is really causing her to feel weak and tired. She has not had her pacemaker checked in several months as she has issues with her device interrogated her for remote checks. She also is complaining of some excessive thirst which she believes is related to uncontrolled diabetes. She is being followed by an endocrinologist. She states she has fallen a couple of times due to muscle weakness.  Past Medical History:  Diagnosis Date  . Angina   . Anxiety   . Arthritis   . Asthma   . Bronchitis   . CHF (congestive heart failure) (HCC)   . Chronic kidney disease    chronic  . Diabetes mellitus   . Fibromyalgia   . Fibromyalgia 10/03/2012  . GERD (gastroesophageal reflux disease)   . Hay fever   . Headache(784.0)   . Heart disease   . High cholesterol   . Hypertension   . Lupus   . Nonischemic cardiomyopathy (HCC)    NYHA classII  . Obesity   . Pneumonia   . Seizures (HCC)    per patient statement  . Shortness of breath   . Stroke [facility])     Past Surgical History:   Procedure Laterality Date  . ICD  02/13/2011   Medtronic Protecta DR XT  . IMPLANTABLE CARDIOVERTER DEFIBRILLATOR IMPLANT N/A 02/13/2011   Procedure: IMPLANTABLE CARDIOVERTER DEFIBRILLATOR IMPLANT;  Surgeon: Thurmon Fair, MD;  Location: [facility] CATH LAB;  Service: Cardiovascular;  Laterality: N/A;  . LEFT HEART CATHETERIZATION WITH CORONARY ANGIOGRAM N/A 12/05/2010   Procedure: LEFT HEART CATHETERIZATION WITH CORONARY ANGIOGRAM;  Surgeon: Chrystie Nose, MD;  Location: [facility] CATH LAB;  Service: Cardiovascular;  Laterality: N/A;  . RIGHT ANKLE    . TUBAL LIGATION    . US ECHOCARDIOGRAPHY  11/09/2010   mild LV enlargement w/conc. LVH & severe global hypokinesis EF 30-35%,mod. diastolic dysfunction, mod. TR,mild AI,mild to mod MR,mild PI     Current Outpatient Prescriptions  Medication Sig Dispense Refill  . ACCU-CHEK SOFTCLIX LANCETS lancets USE AS DIRECTED 100 each 0  . albuterol (PROVENTIL HFA;VENTOLIN HFA) 108 (90 BASE) MCG/ACT inhaler Inhale 2 puffs into the lungs 4 (four) times daily as needed. For wheezing and shortness of breath    . aspirin EC 81 MG tablet Take 81 mg by mouth daily.      . cetirizine (ZYRTEC) 10 MG tablet Take 10 mg by mouth daily.     . clobetasol cream (TEMOVATE) 0.05 % Apply 1 application topically 2 (two) times daily as needed.    . cloNIDine (CATAPRES) 0.2  MG tablet TAKE 1 TABLET BY MOUTH TWICE DAILY 60 tablet 6  . COD LIVER OIL PO Take by mouth.    . fluticasone-salmeterol (ADVAIR HFA) 230-21 MCG/ACT inhaler Inhale 2 puffs into the lungs daily.    . furosemide (LASIX) 40 MG tablet TAKE 1 TABLET BY MOUTH TWICE DAILY. WILL TAKE 1 EXTRA DOSE BASED ON EDEMA 90 tablet 1  . gabapentin (NEURONTIN) 300 MG capsule Take 300 mg by mouth 3 (three) times daily.      Marland Kitchen HUMALOG MIX 50/50 KWIKPEN (50-50) 100 UNIT/ML Kwikpen INJECT 30 UNITS INTO THE SKIN TWICE DAILY. 15 mL 2  . hydroxychloroquine (PLAQUENIL) 200 MG tablet Take 200 mg by mouth 2 (two) times daily.      Marland Kitchen ibuprofen  (ADVIL,MOTRIN) 600 MG tablet Take 600 mg by mouth every 8 (eight) hours as needed.    Marland Kitchen losartan (COZAAR) 100 MG tablet Take 1 tablet (100 mg total) by mouth daily. 90 tablet 3  . meloxicam (MOBIC) 7.5 MG tablet Take 7.5 mg by mouth 2 (two) times daily as needed for pain.    . metoprolol succinate (TOPROL-XL) 100 MG 24 hr tablet Take 1 tablet (100 mg total) by mouth daily. Take with or immediately following a meal. 90 tablet 3  . pimecrolimus (ELIDEL) 1 % cream Apply 1 application topically 2 (two) times daily.     . promethazine (PHENERGAN) 25 MG tablet Take 25 mg by mouth as needed. nausea     . simvastatin (ZOCOR) 20 MG tablet Take 1 tablet (20 mg total) by mouth at bedtime. 90 tablet 3  . spironolactone (ALDACTONE) 100 MG tablet TAKE 1 TABLET BY MOUTH ONCE DAILY. 90 tablet 0  . ULTICARE SHORT PEN NEEDLES 31G X 8 MM MISC USE AS DIRECTED 100 each 5  . venlafaxine XR (EFFEXOR-XR) 150 MG 24 hr capsule Take 150 mg by mouth daily.      No current facility-administered medications for this visit.     Allergies:   Aspirin; Penicillins; and Shellfish allergy    Social History:  The patient  reports that she quit smoking about 10 years ago. Her smoking use included Cigarettes. She started smoking about 36 years ago. She has a 26.00 pack-year smoking history. She has never used smokeless tobacco. She reports that she drinks alcohol. She reports that she does not use drugs.   Family History:  The patient's family history includes Hypertension in her brother, daughter, and sister.    ROS: All other systems are reviewed and negative. Unless otherwise mentioned in H&P    PHYSICAL EXAM: VS:  BP 102/70   Pulse 85   Ht 5\' 11"  (1.803 m)   Wt 292 lb (132.5 kg)   SpO2 97%   BMI 40.73 kg/m  , BMI Body mass index is 40.73 kg/m. GEN: Well nourished, well developed, in no acute distress Obese HEENT: normal  Neck: no JVD, carotid bruits, or masses Cardiac: RRR; no murmurs, rubs, or gallops,no edema   Respiratory:  clear to auscultation bilaterally, normal work of breathing GI: soft, nontender, nondistended, + BS MS: no deformity or atrophy  Skin: warm and dry, no rash Neuro:  Strength and sensation are intact Psych: euthymic mood, full affect   See Pacemaker interrogation.   Recent Labs: 08/22/2015: BUN 26; Creat 1.75; Potassium 4.7; Sodium 139    Lipid Panel No results found for: CHOL, TRIG, HDL, CHOLHDL, VLDL, LDLCALC, LDLDIRECT    Wt Readings from Last 3 Encounters:  04/30/16 292 lb (132.5  kg)  09/24/15 283 lb (128.4 kg)  05/23/15 279 lb (126.6 kg)      Other studies Reviewed:   ASSESSMENT AND PLAN:  1.  ICD in situ: This will be interrogated today in the office, and arrangements will be made to have her remote device machine evaluated and/or replaced. Pacemaker interrogation revealed 2 appropriate shocks in October 2017. The patient had no recollection of these occurring. She has had polymorphic ventricular tachycardia in October causing discharges. She has had occasional ventricular ectopy which did not require ICD therapy. I am going to order a BMET, CBC, to evaluate for anemia or electrolyte imbalances causing her arrhythmias. Will not make any medication changes until labs are reviewed. A new remote device is being sent to the patient the our pacemaker clinic. Copy of the current pacemaker interrogation is being sent to [name].  2. Nonischemic cardiomyopathy: She will remain on beta blocker, ARB, Lasix, and spironolactone. Heart rate currently is controlled the blood pressure is soft. BMET to evaluate for dehydration. We'll need to repeat her echocardiogram at some point to assess for optimization of medications. May consider adding Entresto and removing ARB on next appointment.  3. Hypertension: As stated it is soft today, but normal for documented ejection fraction.     Current medicines are reviewed at length with the patient today.    Labs/ tests ordered  today include:   Orders Placed This Encounter  Procedures  . CBC with Differential  . Basic Metabolic Panel (BMET)  . Hepatic function panel  . Lipid Profile  . HgB A1c  . EKG 12-Lead     Disposition:   FU with 6 months  Signed, Joni Reining, NP  04/30/2016 2:14 PM    [facility] [address] [place], [place] [address] Phone: [phone]; Fax: [phone]

## 2016-04-30 NOTE — Progress Notes (Signed)
Name: Katherine [name]    DOB: 21-Oct-1960  Age: 56 y.o.  MR#: [id number]       PCP:  Alvina Filbert, MD      Insurance: Payor: MEDICAID [place] / Plan: MEDICAID [place] ACCESS / Product Type: *No Product type* /   CC:    Chief Complaint  Patient presents with  . Cardiomyopathy  . Congestive Heart Failure  . Follow-up    VS Vitals:   04/30/16 1330  BP: 102/70  Pulse: 85  SpO2: 97%  Weight: 292 lb (132.5 kg)  Height: 5\' 11"  (1.803 m)    Weights Current Weight  04/30/16 292 lb (132.5 kg)  09/24/15 283 lb (128.4 kg)  05/23/15 279 lb (126.6 kg)    Blood Pressure  BP Readings from Last 3 Encounters:  04/30/16 102/70  09/24/15 (!) 154/99  05/23/15 138/80     Admit date:  (Not on file) Last encounter with RMR:  04/23/2016   Allergy Aspirin; Penicillins; and Shellfish allergy  Current Outpatient Prescriptions  Medication Sig Dispense Refill  . ACCU-CHEK SOFTCLIX LANCETS lancets USE AS DIRECTED 100 each 0  . albuterol (PROVENTIL HFA;VENTOLIN HFA) 108 (90 BASE) MCG/ACT inhaler Inhale 2 puffs into the lungs 4 (four) times daily as needed. For wheezing and shortness of breath    . aspirin EC 81 MG tablet Take 81 mg by mouth daily.      . cetirizine (ZYRTEC) 10 MG tablet Take 10 mg by mouth daily.     . clobetasol cream (TEMOVATE) 0.05 % Apply 1 application topically 2 (two) times daily as needed.    . cloNIDine (CATAPRES) 0.2 MG tablet TAKE 1 TABLET BY MOUTH TWICE DAILY 60 tablet 6  . COD LIVER OIL PO Take by mouth.    . fluticasone-salmeterol (ADVAIR HFA) 230-21 MCG/ACT inhaler Inhale 2 puffs into the lungs daily.    . furosemide (LASIX) 40 MG tablet TAKE 1 TABLET BY MOUTH TWICE DAILY. WILL TAKE 1 EXTRA DOSE BASED ON EDEMA 90 tablet 1  . gabapentin (NEURONTIN) 300 MG capsule Take 300 mg by mouth 3 (three) times daily.      Marland Kitchen HUMALOG MIX 50/50 KWIKPEN (50-50) 100 UNIT/ML Kwikpen INJECT 30 UNITS INTO THE SKIN TWICE DAILY. 15 mL 2  . hydroxychloroquine (PLAQUENIL) 200 MG tablet Take  200 mg by mouth 2 (two) times daily.      Marland Kitchen ibuprofen (ADVIL,MOTRIN) 600 MG tablet Take 600 mg by mouth every 8 (eight) hours as needed.    Marland Kitchen losartan (COZAAR) 100 MG tablet Take 1 tablet (100 mg total) by mouth daily. 90 tablet 3  . meloxicam (MOBIC) 7.5 MG tablet Take 7.5 mg by mouth 2 (two) times daily as needed for pain.    . metoprolol succinate (TOPROL-XL) 100 MG 24 hr tablet Take 1 tablet (100 mg total) by mouth daily. Take with or immediately following a meal. 90 tablet 3  . pimecrolimus (ELIDEL) 1 % cream Apply 1 application topically 2 (two) times daily.     . promethazine (PHENERGAN) 25 MG tablet Take 25 mg by mouth as needed. nausea     . simvastatin (ZOCOR) 20 MG tablet Take 1 tablet (20 mg total) by mouth at bedtime. 90 tablet 3  . spironolactone (ALDACTONE) 100 MG tablet TAKE 1 TABLET BY MOUTH ONCE DAILY. 90 tablet 0  . ULTICARE SHORT PEN NEEDLES 31G X 8 MM MISC USE AS DIRECTED 100 each 5  . venlafaxine XR (EFFEXOR-XR) 150 MG 24 hr capsule Take 150 mg  by mouth daily.      No current facility-administered medications for this visit.     Discontinued Meds:   There are no discontinued medications.  Patient Active Problem List   Diagnosis Date Noted  . Morbid obesity due to excess calories (HCC) 09/24/2015  . Personal history of noncompliance with medical treatment, presenting hazards to health 02/19/2015  . Acute on chronic systolic congestive heart failure, NYHA class 2 (HCC) 11/17/2012  . Non-ischemic cardiomyopathy (HCC) 10/03/2012  . AICD (automatic cardioverter/defibrillator) present 10/03/2012  . HTN (hypertension) 10/03/2012  . Diabetes mellitus with stage 3 chronic kidney disease (HCC) 10/03/2012  . Fibromyalgia 10/03/2012  . Dyslipidemia 10/03/2012  . Lupus 10/03/2012  . Stroke (HCC) 10/03/2012    LABS    Component Value Date/Time   NA 139 08/22/2015 1440   NA 141 10/29/2014 1326   NA 137 10/12/2014 1638   K 4.7 08/22/2015 1440   K 4.2 10/29/2014 1326   K  4.3 10/12/2014 1638   CL 103 08/22/2015 1440   CL 108 10/29/2014 1326   CL 103 10/12/2014 1638   CO2 24 08/22/2015 1440   CO2 25 10/29/2014 1326   CO2 26 10/12/2014 1638   GLUCOSE 143 (H) 08/22/2015 1440   GLUCOSE 124 (H) 10/29/2014 1326   GLUCOSE 101 (H) 10/12/2014 1638   BUN 26 (H) 08/22/2015 1440   BUN 26 (H) 10/29/2014 1326   BUN 52 (H) 10/12/2014 1638   CREATININE 1.75 (H) 08/22/2015 1440   CREATININE 1.37 (H) 10/29/2014 1326   CREATININE 2.67 (H) 10/12/2014 1638   CALCIUM 9.7 08/22/2015 1440   CALCIUM 9.1 10/29/2014 1326   CALCIUM 9.0 10/12/2014 1638   GFRNONAA 77 (L) 12/25/2010 1004   GFRAA 90 (L) 12/25/2010 1004   CMP     Component Value Date/Time   NA 139 08/22/2015 1440   K 4.7 08/22/2015 1440   CL 103 08/22/2015 1440   CO2 24 08/22/2015 1440   GLUCOSE 143 (H) 08/22/2015 1440   BUN 26 (H) 08/22/2015 1440   CREATININE 1.75 (H) 08/22/2015 1440   CALCIUM 9.7 08/22/2015 1440   PROT 7.4 10/03/2012 1355   ALBUMIN 3.9 10/03/2012 1355   AST 15 10/03/2012 1355   ALT 12 10/03/2012 1355   ALKPHOS 102 10/03/2012 1355   BILITOT 0.6 10/03/2012 1355   GFRNONAA 77 (L) 12/25/2010 1004   GFRAA 90 (L) 12/25/2010 1004    No results found for: WBC, HGB, HCT, MCV  Lipid Panel  No results found for: CHOL, TRIG, HDL, CHOLHDL, VLDL, LDLCALC, LDLDIRECT  ABG No results found for: PHART, PCO2ART, PO2ART, HCO3, TCO2, ACIDBASEDEF, O2SAT   No results found for: TSH BNP (last 3 results) No results for input(s): BNP in the last 8760 hours.  ProBNP (last 3 results) No results for input(s): PROBNP in the last 8760 hours.  Cardiac Panel (last 3 results) No results for input(s): CKTOTAL, CKMB, TROPONINI, RELINDX in the last 72 hours.  Iron/TIBC/Ferritin/ %Sat No results found for: IRON, TIBC, FERRITIN, IRONPCTSAT   EKG Orders placed or performed in visit on 11/16/14  . EKG 12-Lead     Prior Assessment and Plan Problem List as of 04/30/2016 Reviewed: 09/24/2015 11:56 AM by Marquis Lunch, MD     Cardiovascular and Mediastinum   Non-ischemic cardiomyopathy (HCC)   HTN (hypertension)   Last Assessment & Plan 06/22/2014 Office Visit Written 06/22/2014 10:38 AM by Marinus Maw, MD    Her blood pressure is well controlled today. No change  in meds. She is encouraged to lose weight.      Stroke [facility])   Acute on chronic systolic congestive heart failure, NYHA class 2 [facility])   Last Assessment & Plan 06/22/2014 Office Visit Written 06/22/2014 10:37 AM by Marinus Maw, MD    Her symptoms are well compensated. She will continue her current meds. I have encouraged her to reduce her sodium intake, lose weight and continue her current meds.        Endocrine   Diabetes mellitus with stage 3 chronic kidney disease (HCC)     Other   AICD (automatic cardioverter/defibrillator) present   Last Assessment & Plan 06/22/2014 Office Visit Written 06/22/2014 10:39 AM by Marinus Maw, MD    Her medtronic ICD is working normally. Will recheck in several months. She has had NSVT.      Fibromyalgia   Dyslipidemia   Lupus   Personal history of noncompliance with medical treatment, presenting hazards to health   Morbid obesity due to excess calories [facility])       Imaging: No results found.

## 2016-05-08 ENCOUNTER — Other Ambulatory Visit: Payer: Self-pay | Admitting: "Endocrinology

## 2016-05-11 ENCOUNTER — Telehealth: Payer: Self-pay

## 2016-05-11 NOTE — Telephone Encounter (Signed)
Pt is requesting a refill on Humalog 50/50. Last visit 09-24-15.

## 2016-05-11 NOTE — Telephone Encounter (Signed)
Pt.notified

## 2016-05-11 NOTE — Telephone Encounter (Signed)
She needs to come for a visit with labs before refill.

## 2016-05-25 ENCOUNTER — Ambulatory Visit (INDEPENDENT_AMBULATORY_CARE_PROVIDER_SITE_OTHER): Payer: Medicaid Other | Admitting: Internal Medicine

## 2016-05-25 ENCOUNTER — Encounter: Payer: Self-pay | Admitting: Internal Medicine

## 2016-05-25 VITALS — BP 122/68 | HR 85 | Ht 70.0 in | Wt 298.0 lb

## 2016-05-25 DIAGNOSIS — I472 Ventricular tachycardia: Secondary | ICD-10-CM | POA: Diagnosis not present

## 2016-05-25 DIAGNOSIS — I4729 Other ventricular tachycardia: Secondary | ICD-10-CM

## 2016-05-25 NOTE — Progress Notes (Signed)
HPI Katherine [name] returns today for followup. She is a pleasant 56 yo woman with a non-ischemic CM, chronic systolic heart failure, SLE, fibromyalgia, HTN,and obesity. She is s/p ICD implantation and returns today for ongoing ICD evaluation and management. She denies syncope or ICD shock. She admits to being sedentary. Allergies  Allergen Reactions  . Aspirin Other (See Comments)  . Penicillins Itching    Can take amoxicillin without a reaction  . Shellfish Allergy Other (See Comments)    Causes to have grand mal seizures     Current Outpatient Prescriptions  Medication Sig Dispense Refill  . ACCU-CHEK SOFTCLIX LANCETS lancets USE AS DIRECTED 100 each 0  . albuterol (PROVENTIL HFA;VENTOLIN HFA) 108 (90 BASE) MCG/ACT inhaler Inhale 2 puffs into the lungs 4 (four) times daily as needed. For wheezing and shortness of breath    . aspirin EC 81 MG tablet Take 81 mg by mouth daily.      . cetirizine (ZYRTEC) 10 MG tablet Take 10 mg by mouth daily.     . clobetasol cream (TEMOVATE) 0.05 % Apply 1 application topically 2 (two) times daily as needed.    . cloNIDine (CATAPRES) 0.2 MG tablet Take 1 tablet (0.2 mg total) by mouth 2 (two) times daily. 180 tablet 3  . COD LIVER OIL PO Take by mouth.    . fluticasone-salmeterol (ADVAIR HFA) 230-21 MCG/ACT inhaler Inhale 2 puffs into the lungs daily.    . furosemide (LASIX) 40 MG tablet TAKE 1 TABLET BY MOUTH TWICE DAILY. WILL TAKE 1 EXTRA DOSE BASED ON EDEMA 90 tablet 1  . HUMALOG MIX 50/50 KWIKPEN (50-50) 100 UNIT/ML Kwikpen INJECT 30 UNITS INTO THE SKIN TWICE DAILY. 15 mL 2  . hydroxychloroquine (PLAQUENIL) 200 MG tablet Take 200 mg by mouth 2 (two) times daily.      Marland Kitchen ibuprofen (ADVIL,MOTRIN) 600 MG tablet Take 600 mg by mouth every 8 (eight) hours as needed.    Marland Kitchen losartan (COZAAR) 100 MG tablet Take 1 tablet (100 mg total) by mouth daily. 90 tablet 3  . meloxicam (MOBIC) 7.5 MG tablet Take 7.5 mg by mouth 2 (two) times daily as needed for  pain.    . metoprolol succinate (TOPROL-XL) 100 MG 24 hr tablet Take 1 tablet (100 mg total) by mouth daily. Take with or immediately following a meal. 90 tablet 3  . pimecrolimus (ELIDEL) 1 % cream Apply 1 application topically 2 (two) times daily.     . pregabalin (LYRICA) 50 MG capsule Take 50 mg by mouth daily.    . promethazine (PHENERGAN) 25 MG tablet Take 25 mg by mouth as needed. nausea     . simvastatin (ZOCOR) 20 MG tablet Take 1 tablet (20 mg total) by mouth at bedtime. 90 tablet 3  . spironolactone (ALDACTONE) 100 MG tablet Take 1 tablet (100 mg total) by mouth daily. 90 tablet 3  . ULTICARE SHORT PEN NEEDLES 31G X 8 MM MISC USE AS DIRECTED 100 each 5  . venlafaxine XR (EFFEXOR-XR) 150 MG 24 hr capsule Take 150 mg by mouth daily.      No current facility-administered medications for this visit.      Past Medical History:  Diagnosis Date  . Angina   . Anxiety   . Arthritis   . Asthma   . Bronchitis   . CHF (congestive heart failure) (HCC)   . Chronic kidney disease    chronic  . Diabetes mellitus   . Fibromyalgia   .  Fibromyalgia 10/03/2012  . GERD (gastroesophageal reflux disease)   . Hay fever   . Headache(784.0)   . Heart disease   . High cholesterol   . Hypertension   . Lupus   . Nonischemic cardiomyopathy (HCC)    NYHA classII  . Obesity   . Pneumonia   . Seizures (HCC)    per patient statement  . Shortness of breath   . Stroke (HCC)     ROS:   All systems reviewed and negative except as noted in the HPI.   Past Surgical History:  Procedure Laterality Date  . ICD  02/13/2011   Medtronic Protecta DR XT  . IMPLANTABLE CARDIOVERTER DEFIBRILLATOR IMPLANT N/A 02/13/2011   Procedure: IMPLANTABLE CARDIOVERTER DEFIBRILLATOR IMPLANT;  Surgeon: Thurmon Fair, MD;  Location: [facility] CATH LAB;  Service: Cardiovascular;  Laterality: N/A;  . LEFT HEART CATHETERIZATION WITH CORONARY ANGIOGRAM N/A 12/05/2010   Procedure: LEFT HEART CATHETERIZATION WITH CORONARY  ANGIOGRAM;  Surgeon: Chrystie Nose, MD;  Location: [facility] CATH LAB;  Service: Cardiovascular;  Laterality: N/A;  . RIGHT ANKLE    . TUBAL LIGATION    . US ECHOCARDIOGRAPHY  11/09/2010   mild LV enlargement w/conc. LVH & severe global hypokinesis EF 30-35%,mod. diastolic dysfunction, mod. TR,mild AI,mild to mod MR,mild PI     Family History  Problem Relation Age of Onset  . Hypertension Sister   . Hypertension Brother   . Hypertension Daughter      Social History   Social History  . Marital status: Legally Separated    Spouse name: N/A  . Number of children: N/A  . Years of education: N/A   Occupational History  . Not on file.   Social History Main Topics  . Smoking status: Former Smoker    Packs/day: 1.00    Years: 26.00    Types: Cigarettes    Start date: 07/01/1979    Quit date: 10/03/2005  . Smokeless tobacco: Never Used  . Alcohol use 0.0 oz/week     Comment: quit 2007  . Drug use: No  . Sexual activity: Yes   Other Topics Concern  . Not on file   Social History Narrative  . No narrative on file     BP 122/68   Pulse 85   Ht 5\' 10"  (1.778 m)   Wt 298 lb (135.2 kg)   SpO2 96%   BMI 42.76 kg/m   Physical Exam:  obese appearing 56 yo woman, NAD HEENT: Unremarkable Neck:  7 cm JVD, no thyromegally Back:  No CVA tenderness Lungs:  Clear with no wheezes, well healed ICD incision. HEART:  Regular rate rhythm, no murmurs, no rubs, no clicks Abd:  soft, obese, positive bowel sounds, no organomegally, no rebound, no guarding Ext:  2 plus pulses, no edema, no cyanosis, no clubbing Skin:  No rashes no nodules Neuro:  CN II through XII intact, motor grossly intact   DEVICE  Normal device function.  See PaceArt for details.   Assess/Plan: 1. Chronic systolic heart failure - her symptoms are well compensated class 2. She will continue her current meds and maintain a low sodium diet. 2. ICD - Her medtronic DDD ICD is working normally.  3. Obesity - she is  not inclined to try and lose weight. 4. VT - she had 2 episodes of PMVT back in October associate with an ICD shock. She was unaware.   Lewayne Bunting, M.D.

## 2016-05-25 NOTE — Patient Instructions (Signed)
Medication Instructions:  Your physician recommends that you continue on your current medications as directed. Please refer to the Current Medication list given to you today.   Labwork: NONE  Testing/Procedures: NONE  Follow-Up: Your physician wants you to follow-up in: 1 YEAR.  You will receive a reminder letter in the mail two months in advance. If you don't receive a letter, please call our office to schedule the follow-up appointment.   Any Other Special Instructions Will Be Listed Below (If Applicable).  Remote monitoring is used to monitor your Pacemaker of ICD from home. This monitoring reduces the number of office visits required to check your device to one time per year. It allows us to keep an eye on the functioning of your device to ensure it is working properly. You are scheduled for a device check from home on 08/24/2016. You may send your transmission at any time that day. If you have a wireless device, the transmission will be sent automatically. After your physician reviews your transmission, you will receive a postcard with your next transmission date.     If you need a refill on your cardiac medications before your next appointment, please call your pharmacy.   

## 2016-05-27 ENCOUNTER — Other Ambulatory Visit: Payer: Self-pay | Admitting: Adult Health

## 2016-06-01 ENCOUNTER — Ambulatory Visit: Payer: Medicaid Other | Admitting: "Endocrinology

## 2016-06-11 ENCOUNTER — Other Ambulatory Visit: Payer: Self-pay | Admitting: Adult Health

## 2016-06-30 ENCOUNTER — Other Ambulatory Visit: Payer: Self-pay | Admitting: Adult Health

## 2016-07-17 ENCOUNTER — Encounter: Payer: Self-pay | Admitting: *Deleted

## 2016-07-23 ENCOUNTER — Encounter: Payer: Self-pay | Admitting: *Deleted

## 2016-07-23 ENCOUNTER — Telehealth: Payer: Self-pay | Admitting: Obstetrics & Gynecology

## 2016-07-23 NOTE — Telephone Encounter (Signed)
Left detailed message on VM regarding HPV and colposcopy procedure. Advised to call back if further questions.

## 2016-07-27 ENCOUNTER — Ambulatory Visit (INDEPENDENT_AMBULATORY_CARE_PROVIDER_SITE_OTHER): Payer: Medicaid Other | Admitting: Obstetrics & Gynecology

## 2016-07-27 ENCOUNTER — Encounter: Payer: Self-pay | Admitting: Obstetrics & Gynecology

## 2016-07-27 VITALS — BP 110/80 | HR 80 | Wt 295.0 lb

## 2016-07-27 DIAGNOSIS — R87810 Cervical high risk human papillomavirus (HPV) DNA test positive: Secondary | ICD-10-CM

## 2016-07-27 NOTE — Progress Notes (Signed)
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 The prior pap showed no abnormalities.  Prior cervical/vaginal disease: . Prior cervical treatment: .  Smoker:  No. New sexual partner:  Yes.      History of abnormal Pap: no  Procedure Details  The risks and benefits of the procedure and Written informed consent obtained.  Speculum placed in vagina and excellent visualization of cervix achieved, cervix swabbed x 3 with acetic acid solution.  Findings: Cervix: no visible lesions, no mosaicism, no punctation and no abnormal vasculature; no biopsies taken. Vaginal inspection: vaginal colposcopy not performed. Vulvar colposcopy: vulvar colposcopy not performed.  Specimens: none  Complications: none.  Plan: Repeat Pap smear 1 year

## 2016-08-24 ENCOUNTER — Encounter: Payer: Medicaid Other | Admitting: *Deleted

## 2016-08-26 ENCOUNTER — Encounter: Payer: Self-pay | Admitting: Cardiology

## 2016-09-10 ENCOUNTER — Telehealth: Payer: Self-pay | Admitting: Adult Health

## 2016-09-10 ENCOUNTER — Other Ambulatory Visit: Payer: Self-pay

## 2016-09-10 MED ORDER — SPIRONOLACTONE 100 MG PO TABS: 100.0000 mg | ORAL_TABLET | Freq: Every day | ORAL | 3 refills | Status: DC

## 2016-09-10 MED ORDER — LOSARTAN POTASSIUM 100 MG PO TABS: 100.0000 mg | ORAL_TABLET | Freq: Every day | ORAL | 3 refills | Status: DC

## 2016-09-10 NOTE — Telephone Encounter (Signed)
Wants 90 day supply of Spironolactone and Losartin sent [employer] / tg

## 2016-09-10 NOTE — Telephone Encounter (Signed)
Sent in 90 day of Spironolactone  &  Losartan to [facility].

## 2016-11-02 ENCOUNTER — Other Ambulatory Visit: Payer: Self-pay | Admitting: "Endocrinology

## 2016-11-27 ENCOUNTER — Other Ambulatory Visit: Payer: Self-pay | Admitting: Adult Health

## 2016-12-09 ENCOUNTER — Telehealth: Payer: Self-pay | Admitting: Internal Medicine

## 2016-12-09 NOTE — Telephone Encounter (Signed)
PT CALLED STATING HER PACEMAKER IS MAKING A NOISE LIKE A SIREN--please give her a call @ [phone]

## 2016-12-09 NOTE — Telephone Encounter (Signed)
Called patient to see if she had attempted to send remote transmission. Patient states that she was using the bathroom and would try to send now.

## 2016-12-09 NOTE — Telephone Encounter (Signed)
Called patient and asked her to send a manual transmission. Patient verbalized understanding. Will call patient once it's received.

## 2016-12-09 NOTE — Telephone Encounter (Signed)
Attempted to call patient x 3-LMTCB/sss  Remote transmission was never received.

## 2016-12-14 NOTE — Telephone Encounter (Signed)
Late entry:  Britta Mccreedy spoke to patient this am about sending in a remote transmission. Patient was unable to send the transmission d/t lack of cell service in her home. Patient was offered an appt to come to the [address] office to have her device checked, but patient declined d/t transportation issues. Appt made for 11/29 @ 1045 w/device clinic ([place]).  Patient also mentioned that she's hearing the audible alert tone just once daily. RRT likely.

## 2016-12-17 ENCOUNTER — Ambulatory Visit (INDEPENDENT_AMBULATORY_CARE_PROVIDER_SITE_OTHER): Payer: Medicaid Other | Admitting: *Deleted

## 2016-12-17 DIAGNOSIS — I428 Other cardiomyopathies: Secondary | ICD-10-CM

## 2016-12-17 LAB — CUP PACEART INCLINIC DEVICE CHECK
Brady Statistic AP VP Percent: 0.02 %
Brady Statistic AS VP Percent: 0.03 %
Brady Statistic AS VS Percent: 92.57 %
Brady Statistic RA Percent Paced: 7.36 %
HIGH POWER IMPEDANCE MEASURED VALUE: 52 Ohm
HighPow Impedance: 304 Ohm
HighPow Impedance: 42 Ohm
Implantable Lead Implant Date: 20130125
Implantable Lead Model: 6947
Implantable Pulse Generator Implant Date: 20130125
Lead Channel Impedance Value: 418 Ohm
Lead Channel Pacing Threshold Amplitude: 0.75 V
Lead Channel Sensing Intrinsic Amplitude: 2.75 mV
Lead Channel Sensing Intrinsic Amplitude: 8.125 mV
Lead Channel Setting Pacing Amplitude: 2 V
Lead Channel Setting Pacing Pulse Width: 0.4 ms
MDC IDC LEAD IMPLANT DT: 20130125
MDC IDC LEAD LOCATION: 753859
MDC IDC LEAD LOCATION: 753860
MDC IDC MSMT BATTERY VOLTAGE: 2.62 V
MDC IDC MSMT LEADCHNL RA PACING THRESHOLD AMPLITUDE: 0.625 V
MDC IDC MSMT LEADCHNL RA PACING THRESHOLD PULSEWIDTH: 0.4 ms
MDC IDC MSMT LEADCHNL RA SENSING INTR AMPL: 2.625 mV
MDC IDC MSMT LEADCHNL RV IMPEDANCE VALUE: 361 Ohm
MDC IDC MSMT LEADCHNL RV PACING THRESHOLD PULSEWIDTH: 0.4 ms
MDC IDC MSMT LEADCHNL RV SENSING INTR AMPL: 10.625 mV
MDC IDC SESS DTM: 20181129133041
MDC IDC SET LEADCHNL RV PACING AMPLITUDE: 2.5 V
MDC IDC SET LEADCHNL RV SENSING SENSITIVITY: 0.6 mV
MDC IDC STAT BRADY AP VS PERCENT: 7.39 %
MDC IDC STAT BRADY RV PERCENT PACED: 0.05 %

## 2016-12-17 NOTE — Progress Notes (Signed)
ICD check in clinic. Normal device function. Thresholds and sensing consistent with previous device measurements. Impedance trends stable over time. 3 VHR -EGMs show NSVT. No mode switches. Histogram distribution appropriate for patient and level of activity. No changes made this session. Device programmed at appropriate safety margins. Device programmed to optimize intrinsic conduction. Battery reached ER 11/19-voltage 2.62V. Will message Dr. Bruna Potter nurse to reach out to patient. ERI alert tone turned off.

## 2016-12-18 ENCOUNTER — Telehealth: Payer: Self-pay

## 2016-12-18 DIAGNOSIS — Z01812 Encounter for preprocedural laboratory examination: Secondary | ICD-10-CM

## 2016-12-18 NOTE — Telephone Encounter (Signed)
Left message notifying Pt would call next week to set up gen change.

## 2016-12-21 NOTE — Telephone Encounter (Signed)
Call placed to Pt.  Discussed scheduling a gen change.  Pt states she would like to schedule, does not need to see [name] prior to change.  Gave Pt upcoming available dates.   Pt to call daughter and find out what day she would prefer.  Gave this nurse name and # for call back.

## 2016-12-29 ENCOUNTER — Other Ambulatory Visit: Payer: Self-pay | Admitting: "Endocrinology

## 2016-12-30 NOTE — Telephone Encounter (Signed)
New message      Patient would like to schedule on December 20th if possible

## 2016-12-31 NOTE — Telephone Encounter (Signed)
Left CVM requesting call back to confirm what time on Dec 20 Pt would like for procedure.  Also what day she can come in for lab work and pick up instruction letter and soap.

## 2017-01-01 NOTE — Telephone Encounter (Signed)
Call returned to Pt.  Per Pt she would like to schedule gen change for 12/08/2016 @ 11:30 am.  Pt to go to [facility] for pre procedure labs.  Pt states she will pick up soap and direction letter at [facility] office.  Message sent to [name] nurse in [facility] notifying of Pt coming to office.  Pt states her device is making a noise.  Discussed with device clinic.  They will speak with Pt regarding device noise.  No further action needed at this time.

## 2017-01-04 ENCOUNTER — Other Ambulatory Visit (HOSPITAL_COMMUNITY)
Admission: RE | Admit: 2017-01-04 | Discharge: 2017-01-04 | Disposition: A | Discharge status: Home / Self Care | Payer: Medicaid Other | Source: Ambulatory Visit | Attending: Internal Medicine | Admitting: Internal Medicine

## 2017-01-04 ENCOUNTER — Ambulatory Visit (INDEPENDENT_AMBULATORY_CARE_PROVIDER_SITE_OTHER): Payer: Self-pay | Admitting: *Deleted

## 2017-01-04 DIAGNOSIS — Z01812 Encounter for preprocedural laboratory examination: Secondary | ICD-10-CM | POA: Insufficient documentation

## 2017-01-04 DIAGNOSIS — I428 Other cardiomyopathies: Secondary | ICD-10-CM

## 2017-01-04 DIAGNOSIS — I5022 Chronic systolic (congestive) heart failure: Secondary | ICD-10-CM

## 2017-01-04 LAB — BASIC METABOLIC PANEL
ANION GAP: 11 (ref 5–15)
BUN: 65 mg/dL — AB (ref 6–20)
CO2: 27 mmol/L (ref 22–32)
Calcium: 9.2 mg/dL (ref 8.9–10.3)
Chloride: 100 mmol/L — ABNORMAL LOW (ref 101–111)
Creatinine, Ser: 4.16 mg/dL — ABNORMAL HIGH (ref 0.44–1.00)
GFR calc Af Amer: 13 mL/min — ABNORMAL LOW (ref >60)
GFR, EST NON AFRICAN AMERICAN: 11 mL/min — AB (ref >60)
Glucose, Bld: 111 mg/dL — ABNORMAL HIGH (ref 65–99)
POTASSIUM: 4.1 mmol/L (ref 3.5–5.1)
Sodium: 138 mmol/L (ref 135–145)

## 2017-01-04 LAB — CUP PACEART INCLINIC DEVICE CHECK
Battery Voltage: 2.62 V
Brady Statistic RA Percent Paced: 3.58 %
HIGH POWER IMPEDANCE MEASURED VALUE: 304 Ohm
HIGH POWER IMPEDANCE MEASURED VALUE: 41 Ohm
HighPow Impedance: 48 Ohm
Implantable Lead Implant Date: 20130125
Implantable Lead Location: 753859
Implantable Lead Location: 753860
Implantable Lead Model: 5076
Implantable Lead Model: 6947
Lead Channel Impedance Value: 361 Ohm
Lead Channel Impedance Value: 399 Ohm
Lead Channel Pacing Threshold Amplitude: 0.625 V
Lead Channel Pacing Threshold Amplitude: 0.75 V
Lead Channel Pacing Threshold Pulse Width: 0.4 ms
Lead Channel Sensing Intrinsic Amplitude: 2.25 mV
Lead Channel Setting Pacing Pulse Width: 0.4 ms
MDC IDC LEAD IMPLANT DT: 20130125
MDC IDC MSMT LEADCHNL RA SENSING INTR AMPL: 2.25 mV
MDC IDC MSMT LEADCHNL RV PACING THRESHOLD PULSEWIDTH: 0.4 ms
MDC IDC MSMT LEADCHNL RV SENSING INTR AMPL: 8.25 mV
MDC IDC MSMT LEADCHNL RV SENSING INTR AMPL: 8.25 mV
MDC IDC PG IMPLANT DT: 20130125
MDC IDC SESS DTM: 20181217155130
MDC IDC SET LEADCHNL RA PACING AMPLITUDE: 2 V
MDC IDC SET LEADCHNL RV PACING AMPLITUDE: 2.5 V
MDC IDC SET LEADCHNL RV SENSING SENSITIVITY: 0.6 mV
MDC IDC STAT BRADY AP VP PERCENT: 0.01 %
MDC IDC STAT BRADY AP VS PERCENT: 3.58 %
MDC IDC STAT BRADY AS VP PERCENT: 0.01 %
MDC IDC STAT BRADY AS VS PERCENT: 96.4 %
MDC IDC STAT BRADY RV PERCENT PACED: 0.03 %

## 2017-01-04 LAB — CBC WITH DIFFERENTIAL/PLATELET
BASOS ABS: 0 10*3/uL (ref 0.0–0.1)
BASOS PCT: 0 %
EOS ABS: 0.3 10*3/uL (ref 0.0–0.7)
EOS PCT: 3 %
HCT: 31.8 % — ABNORMAL LOW (ref 36.0–46.0)
Hemoglobin: 10 g/dL — ABNORMAL LOW (ref 12.0–15.0)
Lymphocytes Relative: 30 %
Lymphs Abs: 2.4 10*3/uL (ref 0.7–4.0)
MCH: 28.6 pg (ref 26.0–34.0)
MCHC: 31.4 g/dL (ref 30.0–36.0)
MCV: 90.9 fL (ref 78.0–100.0)
Monocytes Absolute: 0.5 10*3/uL (ref 0.1–1.0)
Monocytes Relative: 7 %
Neutro Abs: 4.7 10*3/uL (ref 1.7–7.7)
Neutrophils Relative %: 60 %
PLATELETS: 331 10*3/uL (ref 150–400)
RBC: 3.5 MIL/uL — AB (ref 3.87–5.11)
RDW: 13.6 % (ref 11.5–15.5)
WBC: 7.9 10*3/uL (ref 4.0–10.5)

## 2017-01-04 NOTE — Progress Notes (Signed)
ICD check in clinic d/t audible alert tone (N/C). Normal device function. (1) VT-NS episode. Audible alert sounded d/t unsuccessful [facility] alert transmission (d/t ERI). Carelink Alert for ERI d/c'd. Gen change scheduled for 12/20.

## 2017-01-05 ENCOUNTER — Telehealth: Payer: Self-pay

## 2017-01-05 MED ORDER — SPIRONOLACTONE 25 MG PO TABS: 25.0000 mg | ORAL_TABLET | Freq: Every day | ORAL | 3 refills | Status: DC

## 2017-01-05 NOTE — Telephone Encounter (Signed)
CVM left for Pt notifying of elevated Cr and new drug orders from [name].  Reduce spironolactone to 25 mg daily. DC metalozone. Left this nurse name and # for call back if any questions.

## 2017-01-06 ENCOUNTER — Telehealth: Payer: Self-pay

## 2017-01-06 MED ORDER — SPIRONOLACTONE 25 MG PO TABS: 25.0000 mg | ORAL_TABLET | Freq: Every day | ORAL | 3 refills | Status: DC

## 2017-01-06 NOTE — Telephone Encounter (Signed)
Call received from Pt.  Per Pt pharmacy had not received new spironolactone order.  Reentered.  Called pharmacy.  New script received.

## 2017-01-07 ENCOUNTER — Encounter (HOSPITAL_COMMUNITY)
Admission: RE | Disposition: A | Discharge status: Home / Self Care | Payer: Self-pay | Source: Ambulatory Visit | Attending: Internal Medicine

## 2017-01-07 ENCOUNTER — Ambulatory Visit (HOSPITAL_COMMUNITY)
Admission: RE | Admit: 2017-01-07 | Discharge: 2017-01-07 | Disposition: A | Discharge status: Home / Self Care | Payer: Medicaid Other | Source: Ambulatory Visit | Attending: Internal Medicine | Admitting: Internal Medicine

## 2017-01-07 ENCOUNTER — Encounter (HOSPITAL_COMMUNITY): Payer: Self-pay | Admitting: Internal Medicine

## 2017-01-07 DIAGNOSIS — Z91013 Allergy to seafood: Secondary | ICD-10-CM | POA: Diagnosis not present

## 2017-01-07 DIAGNOSIS — Z7982 Long term (current) use of aspirin: Secondary | ICD-10-CM | POA: Diagnosis not present

## 2017-01-07 DIAGNOSIS — E1122 Type 2 diabetes mellitus with diabetic chronic kidney disease: Secondary | ICD-10-CM | POA: Insufficient documentation

## 2017-01-07 DIAGNOSIS — M329 Systemic lupus erythematosus, unspecified: Secondary | ICD-10-CM | POA: Diagnosis not present

## 2017-01-07 DIAGNOSIS — R000 Tachycardia, unspecified: Secondary | ICD-10-CM | POA: Diagnosis not present

## 2017-01-07 DIAGNOSIS — F419 Anxiety disorder, unspecified: Secondary | ICD-10-CM | POA: Insufficient documentation

## 2017-01-07 DIAGNOSIS — Z4502 Encounter for adjustment and management of automatic implantable cardiac defibrillator: Secondary | ICD-10-CM

## 2017-01-07 DIAGNOSIS — I428 Other cardiomyopathies: Secondary | ICD-10-CM | POA: Diagnosis not present

## 2017-01-07 DIAGNOSIS — Z79899 Other long term (current) drug therapy: Secondary | ICD-10-CM | POA: Diagnosis not present

## 2017-01-07 DIAGNOSIS — Z791 Long term (current) use of non-steroidal anti-inflammatories (NSAID): Secondary | ICD-10-CM | POA: Insufficient documentation

## 2017-01-07 DIAGNOSIS — I5022 Chronic systolic (congestive) heart failure: Secondary | ICD-10-CM | POA: Insufficient documentation

## 2017-01-07 DIAGNOSIS — Z880 Allergy status to penicillin: Secondary | ICD-10-CM | POA: Diagnosis not present

## 2017-01-07 DIAGNOSIS — M1990 Unspecified osteoarthritis, unspecified site: Secondary | ICD-10-CM | POA: Insufficient documentation

## 2017-01-07 DIAGNOSIS — Z794 Long term (current) use of insulin: Secondary | ICD-10-CM | POA: Diagnosis not present

## 2017-01-07 DIAGNOSIS — Z6839 Body mass index (BMI) 39.0-39.9, adult: Secondary | ICD-10-CM | POA: Insufficient documentation

## 2017-01-07 DIAGNOSIS — Z8673 Personal history of transient ischemic attack (TIA), and cerebral infarction without residual deficits: Secondary | ICD-10-CM | POA: Insufficient documentation

## 2017-01-07 DIAGNOSIS — E7800 Pure hypercholesterolemia, unspecified: Secondary | ICD-10-CM | POA: Insufficient documentation

## 2017-01-07 DIAGNOSIS — E669 Obesity, unspecified: Secondary | ICD-10-CM | POA: Diagnosis not present

## 2017-01-07 DIAGNOSIS — I110 Hypertensive heart disease with heart failure: Secondary | ICD-10-CM | POA: Diagnosis not present

## 2017-01-07 DIAGNOSIS — Z886 Allergy status to analgesic agent status: Secondary | ICD-10-CM | POA: Diagnosis not present

## 2017-01-07 DIAGNOSIS — N189 Chronic kidney disease, unspecified: Secondary | ICD-10-CM | POA: Diagnosis not present

## 2017-01-07 DIAGNOSIS — I130 Hypertensive heart and chronic kidney disease with heart failure and stage 1 through stage 4 chronic kidney disease, or unspecified chronic kidney disease: Secondary | ICD-10-CM | POA: Insufficient documentation

## 2017-01-07 DIAGNOSIS — M797 Fibromyalgia: Secondary | ICD-10-CM | POA: Diagnosis not present

## 2017-01-07 HISTORY — PX: ICD GENERATOR CHANGEOUT: EP1231

## 2017-01-07 LAB — GLUCOSE, CAPILLARY: GLUCOSE-CAPILLARY: 147 mg/dL — AB (ref 65–99)

## 2017-01-07 LAB — SURGICAL PCR SCREEN
MRSA, PCR: NEGATIVE
STAPHYLOCOCCUS AUREUS: POSITIVE — AB

## 2017-01-07 SURGERY — ICD GENERATOR CHANGEOUT

## 2017-01-07 MED ORDER — ACETAMINOPHEN 325 MG PO TABS: 325.0000 mg | ORAL_TABLET | ORAL | Status: DC | PRN

## 2017-01-07 MED ORDER — MIDAZOLAM HCL 5 MG/5ML IJ SOLN
INTRAMUSCULAR | Status: AC
  Filled 2017-01-07: qty 5

## 2017-01-07 MED ORDER — VANCOMYCIN HCL IN DEXTROSE 1-5 GM/200ML-% IV SOLN
INTRAVENOUS | Status: AC
  Filled 2017-01-07: qty 200

## 2017-01-07 MED ORDER — LIDOCAINE HCL (PF) 1 % IJ SOLN
INTRAMUSCULAR | Status: DC | PRN
  Administered 2017-01-07: 50 mL

## 2017-01-07 MED ORDER — CHLORHEXIDINE GLUCONATE 4 % EX LIQD: 60.0000 mL | Freq: Once | CUTANEOUS | Status: DC

## 2017-01-07 MED ORDER — SODIUM CHLORIDE 0.9 % IR SOLN
Status: AC
  Filled 2017-01-07: qty 2

## 2017-01-07 MED ORDER — MUPIROCIN 2 % EX OINT
1.0000 "application " | TOPICAL_OINTMENT | Freq: Once | CUTANEOUS | Status: AC
  Administered 2017-01-07: 1 via TOPICAL
  Filled 2017-01-07: qty 22

## 2017-01-07 MED ORDER — FENTANYL CITRATE (PF) 100 MCG/2ML IJ SOLN
INTRAMUSCULAR | Status: DC | PRN
  Administered 2017-01-07: 12.5 ug via INTRAVENOUS
  Administered 2017-01-07: 25 ug via INTRAVENOUS

## 2017-01-07 MED ORDER — GENTAMICIN SULFATE 40 MG/ML IJ SOLN
80.0000 mg | INTRAMUSCULAR | Status: AC
  Administered 2017-01-07: 80 mg

## 2017-01-07 MED ORDER — VANCOMYCIN HCL IN DEXTROSE 1-5 GM/200ML-% IV SOLN
1000.0000 mg | INTRAVENOUS | Status: AC
  Administered 2017-01-07: 1000 mg via INTRAVENOUS

## 2017-01-07 MED ORDER — ONDANSETRON HCL 4 MG/2ML IJ SOLN: 4.0000 mg | Freq: Four times a day (QID) | INTRAMUSCULAR | Status: DC | PRN

## 2017-01-07 MED ORDER — MIDAZOLAM HCL 5 MG/5ML IJ SOLN
INTRAMUSCULAR | Status: DC | PRN
  Administered 2017-01-07 (×3): 1 mg via INTRAVENOUS

## 2017-01-07 MED ORDER — FENTANYL CITRATE (PF) 100 MCG/2ML IJ SOLN
INTRAMUSCULAR | Status: AC
  Filled 2017-01-07: qty 2

## 2017-01-07 MED ORDER — SODIUM CHLORIDE 0.9 % IV SOLN
INTRAVENOUS | Status: DC
  Administered 2017-01-07: 11:00:00 via INTRAVENOUS

## 2017-01-07 MED ORDER — LIDOCAINE HCL (PF) 1 % IJ SOLN
INTRAMUSCULAR | Status: AC
  Filled 2017-01-07: qty 30

## 2017-01-07 SURGICAL SUPPLY — 4 items
CABLE SURGICAL S-101-97-12 (CABLE) ×2 IMPLANT
ICD EVERA DR XT MRI DDMB1D4 (ICD Generator) ×2 IMPLANT
PAD DEFIB LIFELINK (PAD) ×2 IMPLANT
TRAY PACEMAKER INSERTION (PACKS) ×2 IMPLANT

## 2017-01-07 NOTE — Discharge Instructions (Signed)

## 2017-01-07 NOTE — H&P (Signed)
HPI Katherine [name] returns today for followup. She is a pleasant 56 yo woman with a non-ischemic CM, chronic systolic heart failure, SLE, fibromyalgia, HTN,and obesity. She is s/p ICD implantation and returns today for ongoing ICD evaluation and management. She denies syncope or ICD shock. She admits to being sedentary. Allergies  Allergen Reactions  . Aspirin Other (See Comments)  . Penicillins Itching    Can take amoxicillin without a reaction  . Shellfish Allergy Other (See Comments)    Causes to have grand mal seizures           Current Outpatient Prescriptions  Medication Sig Dispense Refill  . ACCU-CHEK SOFTCLIX LANCETS lancets USE AS DIRECTED 100 each 0  . albuterol (PROVENTIL HFA;VENTOLIN HFA) 108 (90 BASE) MCG/ACT inhaler Inhale 2 puffs into the lungs 4 (four) times daily as needed. For wheezing and shortness of breath    . aspirin EC 81 MG tablet Take 81 mg by mouth daily.      . cetirizine (ZYRTEC) 10 MG tablet Take 10 mg by mouth daily.     . clobetasol cream (TEMOVATE) 0.05 % Apply 1 application topically 2 (two) times daily as needed.    . cloNIDine (CATAPRES) 0.2 MG tablet Take 1 tablet (0.2 mg total) by mouth 2 (two) times daily. 180 tablet 3  . COD LIVER OIL PO Take by mouth.    . fluticasone-salmeterol (ADVAIR HFA) 230-21 MCG/ACT inhaler Inhale 2 puffs into the lungs daily.    . furosemide (LASIX) 40 MG tablet TAKE 1 TABLET BY MOUTH TWICE DAILY. WILL TAKE 1 EXTRA DOSE BASED ON EDEMA 90 tablet 1  . HUMALOG MIX 50/50 KWIKPEN (50-50) 100 UNIT/ML Kwikpen INJECT 30 UNITS INTO THE SKIN TWICE DAILY. 15 mL 2  . hydroxychloroquine (PLAQUENIL) 200 MG tablet Take 200 mg by mouth 2 (two) times daily.      Marland Kitchen ibuprofen (ADVIL,MOTRIN) 600 MG tablet Take 600 mg by mouth every 8 (eight) hours as needed.    Marland Kitchen losartan (COZAAR) 100 MG tablet Take 1 tablet (100 mg total) by mouth daily. 90 tablet 3  . meloxicam (MOBIC) 7.5 MG tablet Take 7.5 mg by mouth 2 (two) times  daily as needed for pain.    . metoprolol succinate (TOPROL-XL) 100 MG 24 hr tablet Take 1 tablet (100 mg total) by mouth daily. Take with or immediately following a meal. 90 tablet 3  . pimecrolimus (ELIDEL) 1 % cream Apply 1 application topically 2 (two) times daily.     . pregabalin (LYRICA) 50 MG capsule Take 50 mg by mouth daily.    . promethazine (PHENERGAN) 25 MG tablet Take 25 mg by mouth as needed. nausea     . simvastatin (ZOCOR) 20 MG tablet Take 1 tablet (20 mg total) by mouth at bedtime. 90 tablet 3  . spironolactone (ALDACTONE) 100 MG tablet Take 1 tablet (100 mg total) by mouth daily. 90 tablet 3  . ULTICARE SHORT PEN NEEDLES 31G X 8 MM MISC USE AS DIRECTED 100 each 5  . venlafaxine XR (EFFEXOR-XR) 150 MG 24 hr capsule Take 150 mg by mouth daily.      No current facility-administered medications for this visit.          Past Medical History:  Diagnosis Date  . Angina   . Anxiety   . Arthritis   . Asthma   . Bronchitis   . CHF (congestive heart failure) (HCC)   . Chronic kidney disease    chronic  . Diabetes  mellitus   . Fibromyalgia   . Fibromyalgia 10/03/2012  . GERD (gastroesophageal reflux disease)   . Hay fever   . Headache(784.0)   . Heart disease   . High cholesterol   . Hypertension   . Lupus   . Nonischemic cardiomyopathy (HCC)    NYHA classII  . Obesity   . Pneumonia   . Seizures (HCC)    per patient statement  . Shortness of breath   . Stroke (HCC)     ROS:   All systems reviewed and negative except as noted in the HPI.        Past Surgical History:  Procedure Laterality Date  . ICD  02/13/2011   Medtronic Protecta DR XT  . IMPLANTABLE CARDIOVERTER DEFIBRILLATOR IMPLANT N/A 02/13/2011   Procedure: IMPLANTABLE CARDIOVERTER DEFIBRILLATOR IMPLANT;  Surgeon: Thurmon FairMihai Croitoru, MD;  Location: [facility] CATH LAB;  Service: Cardiovascular;  Laterality: N/A;  . LEFT HEART CATHETERIZATION WITH CORONARY  ANGIOGRAM N/A 12/05/2010   Procedure: LEFT HEART CATHETERIZATION WITH CORONARY ANGIOGRAM;  Surgeon: Chrystie NoseKenneth C. Hilty, MD;  [facility];  Service: Cardiovascular;  Laterality: N/A;  . RIGHT ANKLE    . TUBAL LIGATION    . US ECHOCARDIOGRAPHY  11/09/2010   mild LV enlargement w/conc. LVH & severe global hypokinesis EF 30-35%,mod. diastolic dysfunction, mod. TR,mild AI,mild to mod MR,mild PI          Family History  Problem Relation Age of Onset  . Hypertension Sister   . Hypertension Brother   . Hypertension Daughter      Social History        Social History  . Marital status: Legally Separated    Spouse name: N/A  . Number of children: N/A  . Years of education: N/A      Occupational History  . Not on file.         Social History Main Topics  . Smoking status: Former Smoker    Packs/day: 1.00    Years: 26.00    Types: Cigarettes    Start date: 07/01/1979    Quit date: 10/03/2005  . Smokeless tobacco: Never Used  . Alcohol use 0.0 oz/week     Comment: quit 2007  . Drug use: No  . Sexual activity: Yes       Other Topics Concern  . Not on file      Social History Narrative  . No narrative on file     BP 122/68   Pulse 85   Ht 5\' 10"  (1.778 m)   Wt 298 lb (135.2 kg)   SpO2 96%   BMI 42.76 kg/m   Physical Exam:  obese appearing 56 yo woman, NAD HEENT: Unremarkable Neck:  7 cm JVD, no thyromegally Back:  No CVA tenderness Lungs:  Clear with no wheezes, well healed ICD incision. HEART:  Regular rate rhythm, no murmurs, no rubs, no clicks Abd:  soft, obese, positive bowel sounds, no organomegally, no rebound, no guarding Ext:  2 plus pulses, no edema, no cyanosis, no clubbing Skin:  No rashes no nodules Neuro:  CN II through XII intact, motor grossly intact   DEVICE  Normal device function.  See PaceArt for details.   Assess/Plan: 1. Chronic systolic heart failure - her symptoms are well  compensated class 2. She will continue her current meds and maintain a low sodium diet. 2. ICD - Her medtronic DDD ICD is working normally.  3. Obesity - she is not inclined to try and lose  weight. 4. VT - she had 2 episodes of PMVT back in October associate with an ICD shock. She was unaware.   EP Attending  Patient seen and examined. Agree with above. Since prior clinic visit, no change except she has reached ERI. I have reviewed the indications/risks/benefits/goals/expectations and she wishes to proceed.   Leonia Reeves.D.

## 2017-01-20 ENCOUNTER — Ambulatory Visit: Payer: Medicaid Other

## 2017-01-26 ENCOUNTER — Ambulatory Visit: Payer: Medicaid Other

## 2017-02-03 ENCOUNTER — Other Ambulatory Visit: Payer: Medicaid Other

## 2017-02-03 ENCOUNTER — Ambulatory Visit (INDEPENDENT_AMBULATORY_CARE_PROVIDER_SITE_OTHER): Payer: Medicaid Other | Admitting: *Deleted

## 2017-02-03 DIAGNOSIS — I428 Other cardiomyopathies: Secondary | ICD-10-CM | POA: Diagnosis not present

## 2017-02-03 DIAGNOSIS — I10 Essential (primary) hypertension: Secondary | ICD-10-CM

## 2017-02-04 LAB — CUP PACEART INCLINIC DEVICE CHECK
Battery Remaining Longevity: 131 mo
Brady Statistic AP VS Percent: 6.63 %
Brady Statistic AS VP Percent: 0.05 %
Brady Statistic AS VS Percent: 93.3 %
Brady Statistic RV Percent Paced: 0.07 %
Date Time Interrogation Session: 20190116211055
HighPow Impedance: 39 Ohm
HighPow Impedance: 51 Ohm
Implantable Lead Implant Date: 20130125
Implantable Lead Model: 6947
Lead Channel Impedance Value: 399 Ohm
Lead Channel Pacing Threshold Amplitude: 0.625 V
Lead Channel Pacing Threshold Pulse Width: 0.4 ms
Lead Channel Pacing Threshold Pulse Width: 0.4 ms
Lead Channel Sensing Intrinsic Amplitude: 11.875 mV
Lead Channel Sensing Intrinsic Amplitude: 2.25 mV
Lead Channel Sensing Intrinsic Amplitude: 3 mV
Lead Channel Setting Pacing Amplitude: 1.5 V
MDC IDC LEAD IMPLANT DT: 20130125
MDC IDC LEAD LOCATION: 753859
MDC IDC LEAD LOCATION: 753860
MDC IDC MSMT BATTERY VOLTAGE: 3.12 V
MDC IDC MSMT LEADCHNL RV IMPEDANCE VALUE: 323 Ohm
MDC IDC MSMT LEADCHNL RV IMPEDANCE VALUE: 342 Ohm
MDC IDC MSMT LEADCHNL RV PACING THRESHOLD AMPLITUDE: 0.75 V
MDC IDC MSMT LEADCHNL RV SENSING INTR AMPL: 8.5 mV
MDC IDC PG IMPLANT DT: 20181220
MDC IDC SET LEADCHNL RV PACING AMPLITUDE: 2.5 V
MDC IDC SET LEADCHNL RV PACING PULSEWIDTH: 0.4 ms
MDC IDC SET LEADCHNL RV SENSING SENSITIVITY: 0.6 mV
MDC IDC STAT BRADY AP VP PERCENT: 0.01 %
MDC IDC STAT BRADY RA PERCENT PACED: 6.63 %

## 2017-02-04 LAB — BASIC METABOLIC PANEL
BUN / CREAT RATIO: 17 (ref 9–23)
BUN: 28 mg/dL — AB (ref 6–24)
CALCIUM: 9.9 mg/dL (ref 8.7–10.2)
CO2: 23 mmol/L (ref 20–29)
CREATININE: 1.69 mg/dL — AB (ref 0.57–1.00)
Chloride: 103 mmol/L (ref 96–106)
GFR calc Af Amer: 39 mL/min/{1.73_m2} — ABNORMAL LOW (ref >59)
GFR calc non Af Amer: 33 mL/min/{1.73_m2} — ABNORMAL LOW (ref >59)
GLUCOSE: 155 mg/dL — AB (ref 65–99)
Potassium: 4.1 mmol/L (ref 3.5–5.2)
Sodium: 145 mmol/L — ABNORMAL HIGH (ref 134–144)

## 2017-02-04 NOTE — Progress Notes (Signed)
Wound check appointment. Steri-strips removed prior to appointment. Wound without redness or edema. Incision edges approximated, wound well healed. Normal device function. Thresholds, sensing, and impedances consistent with implant measurements. Device programmed at appropriate safety margins. Histogram distribution appropriate for patient and level of activity. No mode switches. (1) ventricular arrhythmia noted x 11bts. Patient educated about wound care, arm mobility, and shock plan. ROV in 3 months with GT/R.

## 2017-04-21 ENCOUNTER — Other Ambulatory Visit: Payer: Self-pay | Admitting: "Endocrinology

## 2017-04-22 ENCOUNTER — Telehealth: Payer: Self-pay

## 2017-04-22 NOTE — Telephone Encounter (Signed)
LVM on pts home phone to call device clinic back, pt left msg on nurse room phone stating that her device went off, also tried pts cell phone VM box not set up.

## 2017-04-27 ENCOUNTER — Telehealth: Payer: Self-pay | Admitting: *Deleted

## 2017-04-27 NOTE — Telephone Encounter (Signed)
Katherine [name] calling because her ICD is alarming once daily (since last Thursday). She reports that she feels great. Home monitor doesn't work- no signal. She doesn't drive and has trouble with transportation. She will call around and see if she can get someone to bring her to the [place] office today and call us back. Direct # to Device Clinic given.

## 2017-04-28 ENCOUNTER — Ambulatory Visit (INDEPENDENT_AMBULATORY_CARE_PROVIDER_SITE_OTHER): Payer: Medicaid Other | Admitting: *Deleted

## 2017-04-28 DIAGNOSIS — I428 Other cardiomyopathies: Secondary | ICD-10-CM | POA: Diagnosis not present

## 2017-04-29 ENCOUNTER — Other Ambulatory Visit: Payer: Self-pay | Admitting: Adult Health

## 2017-04-29 LAB — CUP PACEART INCLINIC DEVICE CHECK
Battery Remaining Longevity: 129 mo
Battery Voltage: 3.02 V
Brady Statistic AP VS Percent: 4.42 %
Brady Statistic AS VS Percent: 95.54 %
HighPow Impedance: 44 Ohm
HighPow Impedance: 53 Ohm
Implantable Lead Implant Date: 20130125
Implantable Lead Implant Date: 20130125
Implantable Lead Location: 753860
Implantable Lead Model: 6947
Implantable Pulse Generator Implant Date: 20181220
Lead Channel Impedance Value: 399 Ohm
Lead Channel Pacing Threshold Amplitude: 0.75 V
Lead Channel Pacing Threshold Pulse Width: 0.4 ms
Lead Channel Sensing Intrinsic Amplitude: 12.5 mV
Lead Channel Sensing Intrinsic Amplitude: 2.75 mV
Lead Channel Sensing Intrinsic Amplitude: 3.375 mV
Lead Channel Setting Pacing Amplitude: 1.5 V
MDC IDC LEAD LOCATION: 753859
MDC IDC MSMT LEADCHNL RA IMPEDANCE VALUE: 437 Ohm
MDC IDC MSMT LEADCHNL RV IMPEDANCE VALUE: 380 Ohm
MDC IDC MSMT LEADCHNL RV PACING THRESHOLD AMPLITUDE: 0.75 V
MDC IDC MSMT LEADCHNL RV PACING THRESHOLD PULSEWIDTH: 0.4 ms
MDC IDC MSMT LEADCHNL RV SENSING INTR AMPL: 10.375 mV
MDC IDC SESS DTM: 20190410181906
MDC IDC SET LEADCHNL RV PACING AMPLITUDE: 2.5 V
MDC IDC SET LEADCHNL RV PACING PULSEWIDTH: 0.4 ms
MDC IDC SET LEADCHNL RV SENSING SENSITIVITY: 0.6 mV
MDC IDC STAT BRADY AP VP PERCENT: 0.01 %
MDC IDC STAT BRADY AS VP PERCENT: 0.03 %
MDC IDC STAT BRADY RA PERCENT PACED: 4.4 %
MDC IDC STAT BRADY RV PERCENT PACED: 0.04 %

## 2017-04-29 NOTE — Progress Notes (Signed)
PT seen d/t device alarming, device tone for unsuccessful [facility] alert for 1 shock for treated  VF on 04/16/17, pt stated that she thought she passed out but also her son passed away around that time. Pt stated that she had been taking her Toprol-XL 100mg  tablet daily as prescribed. Pt stated that she get no cell service at all at her house and does not  have Internet for remote monitoring. Pt voiced understanding of driving restrictions x 6 months. Emphasized that pt needed to keep her apt with [name] on 05/13/17. Pt voiced understanding. Thresholds and sensing consistent with previous device measurements.

## 2017-05-13 ENCOUNTER — Encounter: Payer: Medicaid Other | Admitting: Internal Medicine

## 2017-06-25 ENCOUNTER — Other Ambulatory Visit: Payer: Self-pay | Admitting: Adult Health

## 2017-07-06 ENCOUNTER — Other Ambulatory Visit: Payer: Self-pay | Admitting: Adult Health

## 2017-07-10 ENCOUNTER — Other Ambulatory Visit: Payer: Self-pay | Admitting: Adult Health

## 2017-07-15 ENCOUNTER — Encounter: Payer: Medicaid Other | Admitting: Internal Medicine

## 2017-07-16 ENCOUNTER — Encounter: Payer: Self-pay | Admitting: Internal Medicine

## 2017-08-30 ENCOUNTER — Ambulatory Visit: Payer: Medicaid Other | Admitting: Internal Medicine

## 2017-08-30 ENCOUNTER — Encounter: Payer: Self-pay | Admitting: Internal Medicine

## 2017-08-30 VITALS — BP 126/84 | HR 88 | Ht 70.0 in | Wt 276.0 lb

## 2017-08-30 DIAGNOSIS — I5022 Chronic systolic (congestive) heart failure: Secondary | ICD-10-CM

## 2017-08-30 DIAGNOSIS — I472 Ventricular tachycardia, unspecified: Secondary | ICD-10-CM

## 2017-08-30 DIAGNOSIS — I428 Other cardiomyopathies: Secondary | ICD-10-CM

## 2017-08-30 LAB — CUP PACEART INCLINIC DEVICE CHECK
Battery Remaining Longevity: 123 mo
Brady Statistic AP VP Percent: 0.1 % — CL
Brady Statistic AP VS Percent: 0.8 %
Brady Statistic AS VP Percent: 0.1 % — CL
Implantable Lead Implant Date: 20130125
Implantable Lead Location: 753859
Implantable Lead Model: 5076
Implantable Lead Model: 6947
Implantable Pulse Generator Implant Date: 20181220
Lead Channel Impedance Value: 342 Ohm
Lead Channel Impedance Value: 437 Ohm
Lead Channel Pacing Threshold Amplitude: 0.5 V
Lead Channel Pacing Threshold Pulse Width: 0.4 ms
Lead Channel Sensing Intrinsic Amplitude: 10.3 mV
Lead Channel Sensing Intrinsic Amplitude: 3.5 mV
Lead Channel Setting Pacing Amplitude: 1.5 V
Lead Channel Setting Pacing Amplitude: 2.5 V
Lead Channel Setting Pacing Pulse Width: 0.4 ms
MDC IDC LEAD IMPLANT DT: 20130125
MDC IDC LEAD LOCATION: 753860
MDC IDC MSMT LEADCHNL RA PACING THRESHOLD PULSEWIDTH: 0.4 ms
MDC IDC MSMT LEADCHNL RV PACING THRESHOLD AMPLITUDE: 0.75 V
MDC IDC SESS DTM: 20190812160926
MDC IDC SET LEADCHNL RV SENSING SENSITIVITY: 0.6 mV
MDC IDC STAT BRADY AS VS PERCENT: 99.2 %

## 2017-08-30 MED ORDER — AMIODARONE HCL 200 MG PO TABS: 200.0000 mg | ORAL_TABLET | Freq: Every day | ORAL | 3 refills | Status: DC

## 2017-08-30 NOTE — Progress Notes (Signed)
HPI [name] returns today for followup. She is a pleasant 57 yo woman with a non-ischemic CM, chronic systolic heart failure, SLE, fibromyalgia, HTN,and obesity. She is s/p ICD implantation and returns today for ongoing ICD evaluation and management. She has been evaluated for worsening renal insufficiency. She experienced another ICD shock several weeks ago. Her meds have been adjusted. She has very mild peripheral edema.  Allergies  Allergen Reactions  . Aspirin Other (See Comments)  . Penicillins Itching    Can take amoxicillin without a reaction  . Shellfish Allergy Other (See Comments)    Causes to have grand mal seizures     Current Outpatient Medications  Medication Sig Dispense Refill  . ACCU-CHEK SOFTCLIX LANCETS lancets USE AS DIRECTED 100 each 0  . albuterol (PROVENTIL HFA;VENTOLIN HFA) 108 (90 BASE) MCG/ACT inhaler Inhale 2 puffs into the lungs 4 (four) times daily as needed. For wheezing and shortness of breath    . carvedilol (COREG) 6.25 MG tablet Take 1 tablet by mouth 2 (two) times daily.  0  . cetirizine (ZYRTEC) 10 MG tablet Take 10 mg by mouth daily.     . clobetasol cream (TEMOVATE) 0.05 % Apply 1 application topically 2 (two) times daily as needed.    . cloNIDine (CATAPRES) 0.2 MG tablet Take 1 tablet (0.2 mg total) by mouth 2 (two) times daily. Please keep upcoming appt for more refills, thanks! 60 tablet 0  . clopidogrel (PLAVIX) 75 MG tablet Take 1 tablet by mouth daily.  0  . COD LIVER OIL PO Take by mouth.    . DULoxetine HCl (CYMBALTA PO) Take by mouth 1 day or 1 dose.    Marland Kitchen Fluticasone-Salmeterol (ADVAIR DISKUS) 500-50 MCG/DOSE AEPB Inhale 1 puff into the lungs 2 (two) times daily.    . furosemide (LASIX) 40 MG tablet Take 40 mg by mouth.    Marland Kitchen HUMALOG MIX 50/50 KWIKPEN (50-50) 100 UNIT/ML Kwikpen INJECT 30 UNITS INTO THE SKIN TWICE DAILY. 15 mL 2  . lisinopril (PRINIVIL,ZESTRIL) 5 MG tablet Take 1 tablet by mouth daily.  0  . Multiple  Vitamins-Minerals (MULTIVITAMIN WOMEN 50+ PO) Take 1 tablet by mouth daily.    . pimecrolimus (ELIDEL) 1 % cream Apply 1 application topically 2 (two) times daily.     . promethazine (PHENERGAN) 25 MG tablet Take 25 mg by mouth as needed. nausea     . simvastatin (ZOCOR) 20 MG tablet TAKE ONE TABLET BY MOUTH AT BEDTIME. 30 tablet 0  . spironolactone (ALDACTONE) 25 MG tablet Take 1 tablet (25 mg total) by mouth daily. 90 tablet 3  . ULTICARE SHORT PEN NEEDLES 31G X 8 MM MISC USE AS DIRECTED 100 each 5  . venlafaxine XR (EFFEXOR-XR) 150 MG 24 hr capsule Take 150 mg by mouth daily.      No current facility-administered medications for this visit.      Past Medical History:  Diagnosis Date  . Angina   . Anxiety   . Arthritis   . Asthma   . Bronchitis   . CHF (congestive heart failure) (HCC)   . Chronic kidney disease    chronic  . Diabetes mellitus   . Fibromyalgia   . Fibromyalgia 10/03/2012  . GERD (gastroesophageal reflux disease)   . Hallux valgus of left foot   . Hay fever   . Headache(784.0)   . Heart disease   . High cholesterol   . Hypertension   . Lupus (HCC)   .  Mental disorder    anxiety  . Nonischemic cardiomyopathy (HCC)    NYHA classII  . Obesity   . Pneumonia   . Seizures (HCC)    per patient statement  . Shortness of breath   . Stroke (HCC)   . Systemic lupus erythematosus (HCC)     ROS:   All systems reviewed and negative except as noted in the HPI.   Past Surgical History:  Procedure Laterality Date  . ICD  02/13/2011   Medtronic Protecta DR XT  . ICD GENERATOR CHANGEOUT N/A 01/07/2017   Procedure: ICD GENERATOR CHANGEOUT;  Surgeon: Marinus Maw, MD;  Location: [facility] INVASIVE CV LAB;  Service: Cardiovascular;  Laterality: N/A;  . IMPLANTABLE CARDIOVERTER DEFIBRILLATOR IMPLANT N/A 02/13/2011   Procedure: IMPLANTABLE CARDIOVERTER DEFIBRILLATOR IMPLANT;  Surgeon: Thurmon Fair, MD;  Location: [facility] CATH LAB;  Service: Cardiovascular;  Laterality: N/A;    . LEFT HEART CATHETERIZATION WITH CORONARY ANGIOGRAM N/A 12/05/2010   Procedure: LEFT HEART CATHETERIZATION WITH CORONARY ANGIOGRAM;  Surgeon: Chrystie Nose, MD;  Location: [facility] CATH LAB;  Service: Cardiovascular;  Laterality: N/A;  . RIGHT ANKLE    . TUBAL LIGATION    . US ECHOCARDIOGRAPHY  11/09/2010   mild LV enlargement w/conc. LVH & severe global hypokinesis EF 30-35%,mod. diastolic dysfunction, mod. TR,mild AI,mild to mod MR,mild PI     Family History  Problem Relation Age of Onset  . Hypertension Sister   . Hypertension Brother   . Hypertension Daughter      Social History   Socioeconomic History  . Marital status: Legally Separated    Spouse name: Not on file  . Number of children: Not on file  . Years of education: Not on file  . Highest education level: Not on file  Occupational History  . Not on file  Social Needs  . Financial resource strain: Not on file  . Food insecurity:    Worry: Not on file    Inability: Not on file  . Transportation needs:    Medical: Not on file    Non-medical: Not on file  Tobacco Use  . Smoking status: Former Smoker    Packs/day: 1.00    Years: 26.00    Pack years: 26.00    Types: Cigarettes    Start date: 07/01/1979    Last attempt to quit: 10/03/2005    Years since quitting: 11.9  . Smokeless tobacco: Never Used  Substance and Sexual Activity  . Alcohol use: Yes    Alcohol/week: 0.0 standard drinks    Comment: quit 2007  . Drug use: No  . Sexual activity: Yes  Lifestyle  . Physical activity:    Days per week: Not on file    Minutes per session: Not on file  . Stress: Not on file  Relationships  . Social connections:    Talks on phone: Not on file    Gets together: Not on file    Attends religious service: Not on file    Active member of club or organization: Not on file    Attends meetings of clubs or organizations: Not on file    Relationship status: Not on file  . Intimate partner violence:    Fear of current  or ex partner: Not on file    Emotionally abused: Not on file    Physically abused: Not on file    Forced sexual activity: Not on file  Other Topics Concern  . Not on file  Social History Narrative  .  Not on file     BP 126/84   Pulse 88   Ht 5\' 10"  (1.778 m)   Wt 276 lb (125.2 kg)   LMP 01/30/2011   SpO2 95% Comment: on room air  BMI 39.60 kg/m   Physical Exam:  Well appearing NAD HEENT: Unremarkable Neck:  7 cm JVD, no thyromegally Lymphatics:  No adenopathy Back:  No CVA tenderness Lungs:  Clear with no wheezes HEART:  Regular rate rhythm, no murmurs, no rubs, no clicks Abd:  soft, positive bowel sounds, no organomegally, no rebound, no guarding Ext:  2 plus pulses, no edema, no cyanosis, no clubbing Skin:  No rashes no nodules Neuro:  CN II through XII intact, motor grossly intact  EKG - none  DEVICE  Normal device function.  See PaceArt for details.   Assess/Plan: 1. ICD - her device has been interogated today and is working normally. 2. VT - she has had recurrent VT and I have recommended she start low dose amiodarone.  3. Chronic systolic heart failure - her symptoms are class 2-3. She is nearly euvolemic on exam. We will consider her current meds.  4. Obesity - she has not been able to lose weight.   Leonia Reeves.D.

## 2017-08-30 NOTE — Patient Instructions (Signed)
Your physician wants you to follow-up in: 6 months with Dr.Taylor You will receive a reminder letter in the mail two months in advance. If you don't receive a letter, please call our office to schedule the follow-up appointment.    START Amiodarone  200 mg daily    If you need a refill on your cardiac medications before your next appointment, please call your pharmacy.    All other meds are the same     No labs or tests today      Thank you for choosing [facility] !

## 2017-09-10 ENCOUNTER — Other Ambulatory Visit: Payer: Self-pay | Admitting: Adult Health

## 2017-09-22 ENCOUNTER — Other Ambulatory Visit: Payer: Self-pay | Admitting: Internal Medicine

## 2017-09-22 MED ORDER — CARVEDILOL 6.25 MG PO TABS: 6.2500 mg | ORAL_TABLET | Freq: Two times a day (BID) | ORAL | 1 refills | Status: DC

## 2017-09-22 MED ORDER — LISINOPRIL 5 MG PO TABS: 5.0000 mg | ORAL_TABLET | Freq: Every day | ORAL | 1 refills | Status: DC

## 2017-09-22 NOTE — Telephone Encounter (Signed)
NEEDING REFILLS ON  carvedilol (COREG) 6.25 MG tablet [[id number]]   lisinopril (PRINIVIL,ZESTRIL) 5 MG tablet [[id number]]   Sent to [facility]

## 2017-09-22 NOTE — Telephone Encounter (Signed)
Refill escribed

## 2017-09-23 ENCOUNTER — Other Ambulatory Visit: Payer: Self-pay | Admitting: *Deleted

## 2017-09-23 MED ORDER — CLOPIDOGREL BISULFATE 75 MG PO TABS: 75.0000 mg | ORAL_TABLET | Freq: Every day | ORAL | 3 refills | Status: DC

## 2017-10-08 ENCOUNTER — Other Ambulatory Visit: Payer: Self-pay | Admitting: "Endocrinology

## 2017-10-12 ENCOUNTER — Other Ambulatory Visit: Payer: Self-pay | Admitting: Internal Medicine

## 2017-10-13 ENCOUNTER — Other Ambulatory Visit: Payer: Self-pay

## 2017-10-13 MED ORDER — CLONIDINE HCL 0.2 MG PO TABS: 0.2000 mg | ORAL_TABLET | Freq: Two times a day (BID) | ORAL | 6 refills | Status: DC

## 2017-10-13 NOTE — Telephone Encounter (Signed)
Refill complete 

## 2017-11-05 ENCOUNTER — Telehealth: Payer: Self-pay | Admitting: Cardiology

## 2017-11-05 NOTE — Telephone Encounter (Signed)
Patient called and stated that she heard her device is beeping. Pt states that she can not send a remote due to no cell signal. Call routed to Device Tech RN.

## 2017-11-05 NOTE — Telephone Encounter (Signed)
Spoke to patient about the audible alert tone that she heard coming from her ICD. Patient states that she heard the tone once yesterday and once today. Patient states that she went into the hospital on 10/11 and was discharged on 10/13 for acute CHF sx's. Patient states that she believes that she passed out while in the ER and received a shock. I told patient that her device may be alarming d/t an unsuccessful carelink transmission, but I told her the only way to know for sure is if she could either send in a remote or come into the office today. Patient refused to send a remote and patient states that she would not be able to come into the office bc she does not have a ride. I offered patient an appt w/[address] device clinic for Monday @ 1030. Patient agreeable.  Address for office provided for patient.  Patient aware of DMV driving restriction.  Reviewed ER precautions and shock plan with patient. Patient verbalized understanding.

## 2017-11-08 ENCOUNTER — Ambulatory Visit (INDEPENDENT_AMBULATORY_CARE_PROVIDER_SITE_OTHER): Payer: Medicaid Other | Admitting: *Deleted

## 2017-11-08 ENCOUNTER — Inpatient Hospital Stay: Admission: RE | Admit: 2017-11-08 | Payer: Medicaid Other | Source: Ambulatory Visit

## 2017-11-08 DIAGNOSIS — I428 Other cardiomyopathies: Secondary | ICD-10-CM

## 2017-11-08 DIAGNOSIS — I5022 Chronic systolic (congestive) heart failure: Secondary | ICD-10-CM

## 2017-11-09 LAB — CUP PACEART INCLINIC DEVICE CHECK
Battery Remaining Longevity: 121 mo
Battery Voltage: 2.99 V
Brady Statistic AP VS Percent: 0.31 %
Brady Statistic RA Percent Paced: 0.31 %
Date Time Interrogation Session: 20191021151835
HIGH POWER IMPEDANCE MEASURED VALUE: 42 Ohm
HighPow Impedance: 51 Ohm
Implantable Lead Implant Date: 20130125
Implantable Lead Location: 753860
Implantable Lead Model: 5076
Implantable Lead Model: 6947
Lead Channel Impedance Value: 323 Ohm
Lead Channel Pacing Threshold Amplitude: 0.625 V
Lead Channel Pacing Threshold Pulse Width: 0.4 ms
Lead Channel Sensing Intrinsic Amplitude: 3.125 mV
Lead Channel Setting Sensing Sensitivity: 0.6 mV
MDC IDC LEAD IMPLANT DT: 20130125
MDC IDC LEAD LOCATION: 753859
MDC IDC MSMT LEADCHNL RA IMPEDANCE VALUE: 437 Ohm
MDC IDC MSMT LEADCHNL RA SENSING INTR AMPL: 3.625 mV
MDC IDC MSMT LEADCHNL RV IMPEDANCE VALUE: 342 Ohm
MDC IDC MSMT LEADCHNL RV PACING THRESHOLD AMPLITUDE: 0.875 V
MDC IDC MSMT LEADCHNL RV PACING THRESHOLD PULSEWIDTH: 0.4 ms
MDC IDC MSMT LEADCHNL RV SENSING INTR AMPL: 10 mV
MDC IDC MSMT LEADCHNL RV SENSING INTR AMPL: 10.25 mV
MDC IDC PG IMPLANT DT: 20181220
MDC IDC SET LEADCHNL RA PACING AMPLITUDE: 1.5 V
MDC IDC SET LEADCHNL RV PACING AMPLITUDE: 2.5 V
MDC IDC SET LEADCHNL RV PACING PULSEWIDTH: 0.4 ms
MDC IDC STAT BRADY AP VP PERCENT: 0 %
MDC IDC STAT BRADY AS VP PERCENT: 0.03 %
MDC IDC STAT BRADY AS VS PERCENT: 99.66 %
MDC IDC STAT BRADY RV PERCENT PACED: 0.03 %

## 2017-11-09 NOTE — Progress Notes (Signed)
ICD check in clinic. Normal device function. Thresholds and sensing consistent with previous device measurements. Impedance trends stable over time. (10) ventricular arrhythmias recorded, (1) treated w/35J ICD shock while in ER for acute HF sx's, (1) VT-NS episode since hospital discharge. No AT/AF episodes recorded. Histogram distribution appropriate for patient and level of activity. Abnormal thoracic impedance since 10/5 - patient instructed to weigh daily and restrict sodium, will arrange f/u with APP within the next week. No changes made this session. Device programmed at appropriate safety margins. Device programmed to optimize intrinsic conduction. Estimated longevity 10.1 years. Pt will follow up with GT/R in 02/2018. Patient education completed including shock plan. Alert tones demonstrated for patient.

## 2017-11-12 ENCOUNTER — Ambulatory Visit: Admit: 2017-11-12 | Payer: Medicaid Other | Admitting: Podiatry

## 2017-11-12 SURGERY — CORRECTION, HALLUX VALGUS
Anesthesia: Choice | Laterality: Left

## 2017-11-24 ENCOUNTER — Other Ambulatory Visit: Payer: Self-pay | Admitting: Internal Medicine

## 2017-11-24 NOTE — Telephone Encounter (Signed)
This is a [place] pt.  °

## 2017-11-29 ENCOUNTER — Telehealth: Payer: Self-pay | Admitting: Cardiology

## 2017-11-29 ENCOUNTER — Encounter: Payer: Medicaid Other | Admitting: *Deleted

## 2017-11-29 NOTE — Telephone Encounter (Signed)
LMOVM reminding pt to send remote transmission.   

## 2017-12-01 ENCOUNTER — Encounter: Payer: Self-pay | Admitting: Cardiology

## 2017-12-10 ENCOUNTER — Ambulatory Visit (INDEPENDENT_AMBULATORY_CARE_PROVIDER_SITE_OTHER): Payer: Medicaid Other | Admitting: Student

## 2017-12-10 ENCOUNTER — Encounter: Payer: Self-pay | Admitting: Student

## 2017-12-10 ENCOUNTER — Encounter: Payer: Self-pay | Admitting: *Deleted

## 2017-12-10 VITALS — BP 128/76 | HR 77 | Ht 72.0 in | Wt 291.4 lb

## 2017-12-10 DIAGNOSIS — E785 Hyperlipidemia, unspecified: Secondary | ICD-10-CM

## 2017-12-10 DIAGNOSIS — I5022 Chronic systolic (congestive) heart failure: Secondary | ICD-10-CM | POA: Diagnosis not present

## 2017-12-10 DIAGNOSIS — I10 Essential (primary) hypertension: Secondary | ICD-10-CM | POA: Diagnosis not present

## 2017-12-10 DIAGNOSIS — I428 Other cardiomyopathies: Secondary | ICD-10-CM

## 2017-12-10 DIAGNOSIS — I472 Ventricular tachycardia, unspecified: Secondary | ICD-10-CM

## 2017-12-10 DIAGNOSIS — Z79899 Other long term (current) drug therapy: Secondary | ICD-10-CM

## 2017-12-10 MED ORDER — AMIODARONE HCL 200 MG PO TABS: 200.0000 mg | ORAL_TABLET | Freq: Every day | ORAL | 3 refills | Status: DC

## 2017-12-10 MED ORDER — LOSARTAN POTASSIUM 100 MG PO TABS: 100.0000 mg | ORAL_TABLET | Freq: Every day | ORAL | 3 refills | Status: DC

## 2017-12-10 MED ORDER — FUROSEMIDE 40 MG PO TABS: 40.0000 mg | ORAL_TABLET | Freq: Two times a day (BID) | ORAL | Status: DC

## 2017-12-10 MED ORDER — METOPROLOL SUCCINATE ER 100 MG PO TB24: 100.0000 mg | ORAL_TABLET | Freq: Every day | ORAL | 3 refills | Status: DC

## 2017-12-10 NOTE — Progress Notes (Signed)
Cardiology Office Note    Date:  12/10/2017   ID:  Katherine [name], DOB Oct 22, 1960, MRN [id number]  PCP:  Alvina Filbert, MD  Cardiologist: Lewayne Bunting, MD    Chief Complaint  Patient presents with  . Hospitalization Follow-up    History of Present Illness:    Katherine [name] is a 57 y.o. female with past medical history of chronic combined systolic and diastolic CHF (EF 21-30% by cath in 11/2010 with cath showing normal cors), nonischemic cardiomyopathy (s/p ICD placement in 2013 with gen-change in 12/2016), HTN, HLD, and IDDM who presents to the office today for hospital follow-up.   She was last examined by [name] in 08/2017 and reported receiving ICD shocks several weeks ago.  Her device is interrogated and found to be working normally with a shock having been delivered on 08/14/2017.  She was started on low-dose Amiodarone 200 mg daily at the time of her visit.   In the interim, by review of notes she was admitted to the hospital in 10/2017 for a CHF exacerbation.  Reported having received a shock while in the ED and her device did show this was delivered and she had not had any recurrent VT episodes since hospital discharge. Was found to have abnormal thoracic impedance since 10/23/2017 and close follow-up was arranged.  In talking with the patient and her friend today, she reports having been admitted to [facility] in [place], [place] in 10/2017 for an acute CHF exacerbation. Records have been requested but are not yet available. She believes a repeat echocardiogram was performed during that admission. She reports that two of her medications over the past several months have been changed during admissions at [facility] and that her Losartan was switched to Lisinopril and Toprol-XL was switched to Carvedilol and since these adjustments, she has experienced worsening fatigue and nausea. She actually self discontinued Amiodarone that [name] had started in 08/2017 due to thinking this  might be contributing to her symptoms.  She reports compliance with her diuretic regimen but says weight has trended up on her home scales over the past several months. She initially tells me she consumes a low-sodium diet but then has deli meat several times per week and consumed tomato soup yesterday.    Past Medical History:  Diagnosis Date  . Angina   . Anxiety   . Arthritis   . Asthma   . Bronchitis   . CHF (congestive heart failure) (HCC)    a. EF 20-25% by cath in 11/2010 with cath showing normal cors  . Chronic kidney disease    chronic  . Diabetes mellitus   . Fibromyalgia   . Fibromyalgia 10/03/2012  . GERD (gastroesophageal reflux disease)   . Hallux valgus of left foot   . Hay fever   . Headache(784.0)   . Heart disease   . High cholesterol   . Hypertension   . Lupus (HCC)   . Mental disorder    anxiety  . Nonischemic cardiomyopathy (HCC)    NYHA classII  . Obesity   . Pneumonia   . Seizures (HCC)    per patient statement  . Shortness of breath   . Stroke (HCC)   . Systemic lupus erythematosus (HCC)     Past Surgical History:  Procedure Laterality Date  . ICD  02/13/2011   Medtronic Protecta DR XT  . ICD GENERATOR CHANGEOUT N/A 01/07/2017   Procedure: ICD GENERATOR CHANGEOUT;  Surgeon: Marinus Maw, MD;  Location: [facility]  INVASIVE CV LAB;  Service: Cardiovascular;  Laterality: N/A;  . IMPLANTABLE CARDIOVERTER DEFIBRILLATOR IMPLANT N/A 02/13/2011   Procedure: IMPLANTABLE CARDIOVERTER DEFIBRILLATOR IMPLANT;  Surgeon: Thurmon Fair, MD;  Location: [facility] CATH LAB;  Service: Cardiovascular;  Laterality: N/A;  . LEFT HEART CATHETERIZATION WITH CORONARY ANGIOGRAM N/A 12/05/2010   Procedure: LEFT HEART CATHETERIZATION WITH CORONARY ANGIOGRAM;  Surgeon: Chrystie Nose, MD;  Location: [facility] CATH LAB;  Service: Cardiovascular;  Laterality: N/A;  . RIGHT ANKLE    . TUBAL LIGATION    . US ECHOCARDIOGRAPHY  11/09/2010   mild LV enlargement w/conc. LVH & severe global  hypokinesis EF 30-35%,mod. diastolic dysfunction, mod. TR,mild AI,mild to mod MR,mild PI    Current Medications: Outpatient Medications Prior to Visit  Medication Sig Dispense Refill  . ACCU-CHEK SOFTCLIX LANCETS lancets USE AS DIRECTED 100 each 0  . albuterol (PROVENTIL HFA;VENTOLIN HFA) 108 (90 BASE) MCG/ACT inhaler Inhale 2 puffs into the lungs 4 (four) times daily as needed. For wheezing and shortness of breath    . cetirizine (ZYRTEC) 10 MG tablet Take 10 mg by mouth daily.     . clobetasol cream (TEMOVATE) 0.05 % Apply 1 application topically 2 (two) times daily as needed.    . cloNIDine (CATAPRES) 0.2 MG tablet Take 1 tablet (0.2 mg total) by mouth 2 (two) times daily. 60 tablet 6  . clopidogrel (PLAVIX) 75 MG tablet Take 1 tablet (75 mg total) by mouth daily. 90 tablet 3  . COD LIVER OIL PO Take by mouth.    . DULoxetine HCl (CYMBALTA PO) Take by mouth 1 day or 1 dose.    Marland Kitchen Fluticasone-Salmeterol (ADVAIR DISKUS) 500-50 MCG/DOSE AEPB Inhale 1 puff into the lungs 2 (two) times daily.    Marland Kitchen HUMALOG MIX 50/50 KWIKPEN (50-50) 100 UNIT/ML Kwikpen INJECT 30 UNITS INTO THE SKIN TWICE DAILY. 15 mL 2  . Multiple Vitamins-Minerals (MULTIVITAMIN WOMEN 50+ PO) Take 1 tablet by mouth daily.    . pimecrolimus (ELIDEL) 1 % cream Apply 1 application topically 2 (two) times daily.     . promethazine (PHENERGAN) 25 MG tablet Take 25 mg by mouth as needed. nausea     . simvastatin (ZOCOR) 20 MG tablet TAKE ONE TABLET BY MOUTH AT BEDTIME. 30 tablet 0  . spironolactone (ALDACTONE) 25 MG tablet TAKE 1 TABLET BY MOUTH ONCE DAILY. 90 tablet 0  . ULTICARE SHORT PEN NEEDLES 31G X 8 MM MISC USE AS DIRECTED 100 each 5  . venlafaxine XR (EFFEXOR-XR) 150 MG 24 hr capsule Take 150 mg by mouth daily.     Marland Kitchen amiodarone (PACERONE) 200 MG tablet Take 1 tablet (200 mg total) by mouth daily. 90 tablet 3  . carvedilol (COREG) 6.25 MG tablet Take 1 tablet (6.25 mg total) by mouth 2 (two) times daily. 180 tablet 1  .  furosemide (LASIX) 40 MG tablet Take 40 mg by mouth.    Marland Kitchen lisinopril (PRINIVIL,ZESTRIL) 5 MG tablet Take 1 tablet (5 mg total) by mouth daily. 90 tablet 1   No facility-administered medications prior to visit.      Allergies:   Aspirin; Penicillins; and Shellfish allergy   Social History   Socioeconomic History  . Marital status: Legally Separated    Spouse name: Not on file  . Number of children: Not on file  . Years of education: Not on file  . Highest education level: Not on file  Occupational History  . Not on file  Social Needs  . Financial resource strain: Not  on file  . Food insecurity:    Worry: Not on file    Inability: Not on file  . Transportation needs:    Medical: Not on file    Non-medical: Not on file  Tobacco Use  . Smoking status: Former Smoker    Packs/day: 1.00    Years: 26.00    Pack years: 26.00    Types: Cigarettes    Start date: 07/01/1979    Last attempt to quit: 10/03/2005    Years since quitting: 12.1  . Smokeless tobacco: Never Used  Substance and Sexual Activity  . Alcohol use: Not Currently    Alcohol/week: 0.0 standard drinks    Comment: quit 2007  . Drug use: No  . Sexual activity: Yes  Lifestyle  . Physical activity:    Days per week: Not on file    Minutes per session: Not on file  . Stress: Not on file  Relationships  . Social connections:    Talks on phone: Not on file    Gets together: Not on file    Attends religious service: Not on file    Active member of club or organization: Not on file    Attends meetings of clubs or organizations: Not on file    Relationship status: Not on file  Other Topics Concern  . Not on file  Social History Narrative  . Not on file     Family History:  The patient's family history includes Hypertension in her brother, daughter, and sister.   Review of Systems:   Please see the history of present illness.     General:  No chills, fever, night sweats or weight changes.  Cardiovascular:  No  chest pain, edema, palpitations, paroxysmal nocturnal dyspnea. Positive for dyspnea on exertion and orthopnea (at baseline).  Dermatological: No rash, lesions/masses Respiratory: No cough, dyspnea Urologic: No hematuria, dysuria Abdominal:   No nausea, vomiting, diarrhea, bright red blood per rectum, melena, or hematemesis Neurologic:  No visual changes, wkns, changes in mental status. All other systems reviewed and are otherwise negative except as noted above.   Physical Exam:    VS:  BP 128/76   Pulse 77   Ht 6' (1.829 m)   Wt 291 lb 6.4 oz (132.2 kg)   LMP 01/30/2011   SpO2 99%   BMI 39.52 kg/m    General: Well developed, overweight African American female appearing in no acute distress. Head: Normocephalic, atraumatic, sclera non-icteric, no xanthomas, nares are without discharge.  Neck: No carotid bruits. JVD at 8cm  Lungs: Respirations regular and unlabored, without wheezes or rales.  Heart: Regular rate and rhythm. No S3 or S4.  No murmur, no rubs, or gallops appreciated. Abdomen: Soft, non-tender, non-distended with normoactive bowel sounds. No hepatomegaly. No rebound/guarding. No obvious abdominal masses. Msk:  Strength and tone appear normal for age. No joint deformities or effusions. Extremities: No clubbing or cyanosis. Trace lower extremity edema.  Distal pedal pulses are 2+ bilaterally. Neuro: Alert and oriented X 3. Moves all extremities spontaneously. No focal deficits noted. Psych:  Responds to questions appropriately with a normal affect. Skin: No rashes or lesions noted  Wt Readings from Last 3 Encounters:  12/10/17 291 lb 6.4 oz (132.2 kg)  08/30/17 276 lb (125.2 kg)  01/07/17 285 lb (129.3 kg)      Studies/Labs Reviewed:   EKG:  EKG is not ordered today.   Recent Labs: 01/04/2017: Hemoglobin 10.0; Platelets 331 02/03/2017: BUN 28; Creatinine, Ser 1.69; Potassium 4.1;  Sodium 145   Lipid Panel No results found for: CHOL, TRIG, HDL, CHOLHDL, VLDL,  LDLCALC, LDLDIRECT  Additional studies/ records that were reviewed today include:   Cardiac Catheterization: 11/2010 Left Ventriculography:             EF:  20-25%             Wall Motion: global hypokinesis  Coronary Angiographic Data:  Angiographically normal coronary arteries  Impression: 1.  Angiographically normal coronary arteries 2.  Non-ischemic, dilated cardiomyopathy  Plan: 1.  Will optimize medications and outpatient work-up for NICM.  The case and results was discussed with the patient. The case and results was not discussed with the patient's PCP.   ICD GEN CHANGE: 12/2016 Conclusion: Successful removal of a previously implanted dual-chamber ICD and insertion of a new dual-chamber ICD in a patient with a non-ischemic cardiomyopathy and polymorphic ventricular tachycardia status post prior successful ICD therapies.  Assessment:    1. Chronic systolic heart failure (HCC)   2. Non-ischemic cardiomyopathy (HCC)   3. Ventricular tachycardia (HCC)   4. Essential hypertension   5. Dyslipidemia   6. Medication management      Plan:   In order of problems listed above:  1. Chronic Systolic CHF/ Nonischemic Cardiomyopathy - EF 20-25% with normal cors by cath in 11/2010. She reports a repeat echocardiogram was obtained during her admission and I will request these records. - She does have trace edema on examination but lungs are clear. She has not been compliant with a low-sodium diet and we reviewed changes to make an effort to improve her fluid status. - Will continue on Lasix 40 mg twice daily. Encouraged to follow daily weights. Will plan to switch Carvedilol back to her prior Toprol-XL dosing and Lisinopril to Losartan given her frequent dizziness and nausea with these medications as it is unclear why they were switched during her admission.  2. VT - device followed by [name] and she was recently started on Amiodarone 200 mg daily in 08/2017 due to her  frequent episodes of VT. She reports having nausea with the medicine but also underwent several medication adjustments during that timeframe. I recommended that she restart this as prescribed. If symptoms represent, recommended that she switch this to 100 mg twice daily. Was advised against self discontinuing medications in the future, especially in the setting of her cardiomyopathy and recurrent VT.  3. HTN - BP is well controlled at 128/76 during today's visit. She reports having frequent nausea and dizziness ever since Losartan was switched to Lisinopril and Toprol-XL was switched to Carvedilol during her admission.  Unclear why this was performed as both medications are indicated in the setting of cardiomyopathy. Will switch back to her prior regimen of Losartan and Toprol-XL. Will plan for a repeat BMET in 2 weeks.  4. HLD - followed by PCP. She remains on Simvastatin 20 mg daily.   Medication Adjustments/Labs and Tests Ordered: Current medicines are reviewed at length with the patient today.  Concerns regarding medicines are outlined above.  Medication changes, Labs and Tests ordered today are listed in the Patient Instructions below. Patient Instructions  Medication Instructions:  Your physician has recommended you make the following change in your medication:  Stop Taking Coreg and Lisinopril  Start Taking Losartan 100 mg Daily  Start Taking Toprol XL 100 mg Daily  Start Amiodarone 200mg  Daily   If you need a refill on your cardiac medications before your next appointment, please call your pharmacy.  Lab work: Your physician recommends that you return for lab work in: 2 Weeks   If you have labs (blood work) drawn today and your tests are completely normal, you will receive your results only by: Marland Kitchen MyChart Message (if you have MyChart) OR . A paper copy in the mail If you have any lab test that is abnormal or we need to change your treatment, we will call you to review the  results.  Testing/Procedures: NONE   Follow-Up: At [facility], you and your health needs are our priority.  As part of our continuing mission to provide you with exceptional heart care, we have created designated Provider Care Teams.  These Care Teams include your primary Cardiologist (physician) and Advanced Practice Providers (APPs -  Physician Assistants and Nurse Practitioners) who all work together to provide you with the care you need, when you need it. You will need a follow up appointment in 6 weeks.  Please call our office 2 months in advance to schedule this appointment.  You may see Lewayne Bunting, MD or one of the following Advanced Practice Providers on your designated Care Team:   Randall An, PA-C [facility]) . Jacolyn Reedy, PA-C [facility] Office)  Any Other Special Instructions Will Be Listed Below (If Applicable). Thank you for choosing [facility]!     Low-Sodium Eating Plan Sodium, which is an element that makes up salt, helps you maintain a healthy balance of fluids in your body. Too much sodium can increase your blood pressure and cause fluid and waste to be held in your body. Your health care provider or dietitian may recommend following this plan if you have high blood pressure (hypertension), kidney disease, liver disease, or heart failure. Eating less sodium can help lower your blood pressure, reduce swelling, and protect your heart, liver, and kidneys. What are tips for following this plan? General guidelines  Most people on this plan should limit their sodium intake to 1,500-2,000 mg (milligrams) of sodium each day. Reading food labels  The Nutrition Facts label lists the amount of sodium in one serving of the food. If you eat more than one serving, you must multiply the listed amount of sodium by the number of servings.  Choose foods with less than 140 mg of sodium per serving.  Avoid foods with 300 mg of sodium or more per  serving. Shopping  Look for lower-sodium products, often labeled as "low-sodium" or "no salt added."  Always check the sodium content even if foods are labeled as "unsalted" or "no salt added".  Buy fresh foods. ? Avoid canned foods and premade or frozen meals. ? Avoid canned, cured, or processed meats  Buy breads that have less than 80 mg of sodium per slice. Cooking  Eat more home-cooked food and less restaurant, buffet, and fast food.  Avoid adding salt when cooking. Use salt-free seasonings or herbs instead of table salt or sea salt. Check with your health care provider or pharmacist before using salt substitutes.  Cook with plant-based oils, such as canola, sunflower, or olive oil. Meal planning  When eating at a restaurant, ask that your food be prepared with less salt or no salt, if possible.  Avoid foods that contain MSG (monosodium glutamate). MSG is sometimes added to [place] food, bouillon, and some canned foods. What foods are recommended? The items listed may not be a complete list. Talk with your dietitian about what dietary choices are best for you. Grains Low-sodium cereals, including oats, puffed wheat  and rice, and shredded wheat. Low-sodium crackers. Unsalted rice. Unsalted pasta. Low-sodium bread. Whole-grain breads and whole-grain pasta. Vegetables Fresh or frozen vegetables. "No salt added" canned vegetables. "No salt added" tomato sauce and paste. Low-sodium or reduced-sodium tomato and vegetable juice. Fruits Fresh, frozen, or canned fruit. Fruit juice. Meats and other protein foods Fresh or frozen (no salt added) meat, poultry, seafood, and fish. Low-sodium canned tuna and salmon. Unsalted nuts. Dried peas, beans, and lentils without added salt. Unsalted canned beans. Eggs. Unsalted nut butters. Dairy Milk. Soy milk. Cheese that is naturally low in sodium, such as ricotta cheese, fresh mozzarella, or Swiss cheese Low-sodium or reduced-sodium cheese. Cream  cheese. Yogurt. Fats and oils Unsalted butter. Unsalted margarine with no trans fat. Vegetable oils such as canola or olive oils. Seasonings and other foods Fresh and dried herbs and spices. Salt-free seasonings. Low-sodium mustard and ketchup. Sodium-free salad dressing. Sodium-free light mayonnaise. Fresh or refrigerated horseradish. Lemon juice. Vinegar. Homemade, reduced-sodium, or low-sodium soups. Unsalted popcorn and pretzels. Low-salt or salt-free chips. What foods are not recommended? The items listed may not be a complete list. Talk with your dietitian about what dietary choices are best for you. Grains Instant hot cereals. Bread stuffing, pancake, and biscuit mixes. Croutons. Seasoned rice or pasta mixes. Noodle soup cups. Boxed or frozen macaroni and cheese. Regular salted crackers. Self-rising flour. Vegetables Sauerkraut, pickled vegetables, and relishes. Olives. [place] fries. Onion rings. Regular canned vegetables (not low-sodium or reduced-sodium). Regular canned tomato sauce and paste (not low-sodium or reduced-sodium). Regular tomato and vegetable juice (not low-sodium or reduced-sodium). Frozen vegetables in sauces. Meats and other protein foods Meat or fish that is salted, canned, smoked, spiced, or pickled. Bacon, ham, sausage, hotdogs, corned beef, chipped beef, packaged lunch meats, salt pork, jerky, pickled herring, anchovies, regular canned tuna, sardines, salted nuts. Dairy Processed cheese and cheese spreads. Cheese curds. Blue cheese. Feta cheese. String cheese. Regular cottage cheese. Buttermilk. Canned milk. Fats and oils Salted butter. Regular margarine. Ghee. Bacon fat. Seasonings and other foods Onion salt, garlic salt, seasoned salt, table salt, and sea salt. Canned and packaged gravies. Worcestershire sauce. Tartar sauce. Barbecue sauce. Teriyaki sauce. Soy sauce, including reduced-sodium. Steak sauce. Fish sauce. Oyster sauce. Cocktail sauce. Horseradish that you  find on the shelf. Regular ketchup and mustard. Meat flavorings and tenderizers. Bouillon cubes. Hot sauce and Tabasco sauce. Premade or packaged marinades. Premade or packaged taco seasonings. Relishes. Regular salad dressings. Salsa. Potato and tortilla chips. Corn chips and puffs. Salted popcorn and pretzels. Canned or dried soups. Pizza. Frozen entrees and pot pies. Summary  Eating less sodium can help lower your blood pressure, reduce swelling, and protect your heart, liver, and kidneys.  Most people on this plan should limit their sodium intake to 1,500-2,000 mg (milligrams) of sodium each day.  Canned, boxed, and frozen foods are high in sodium. Restaurant foods, fast foods, and pizza are also very high in sodium. You also get sodium by adding salt to food.  Try to cook at home, eat more fresh fruits and vegetables, and eat less fast food, canned, processed, or prepared foods. This information is not intended to replace advice given to you by your health care provider. Make sure you discuss any questions you have with your health care provider. Document Released: 06/27/2001 Document Revised: 12/30/2015 Document Reviewed: 12/30/2015 Elsevier Interactive Patient Education  [address]   Signed, Ellsworth Lennox, [place]  12/10/2017 5:01 PM    [facility] [address] [place], [place] [address] Phone: [phone]

## 2017-12-10 NOTE — Patient Instructions (Addendum)
Medication Instructions:  Your physician has recommended you make the following change in your medication:  Stop Taking Coreg and Lisinopril  Start Taking Losartan 100 mg Daily  Start Taking Toprol XL 100 mg Daily  Start Amiodarone 200mg  Daily   If you need a refill on your cardiac medications before your next appointment, please call your pharmacy.   Lab work: Your physician recommends that you return for lab work in: 2 Weeks   If you have labs (blood work) drawn today and your tests are completely normal, you will receive your results only by: Marland Kitchen MyChart Message (if you have MyChart) OR . A paper copy in the mail If you have any lab test that is abnormal or we need to change your treatment, we will call you to review the results.  Testing/Procedures: NONE   Follow-Up: At [facility], you and your health needs are our priority.  As part of our continuing mission to provide you with exceptional heart care, we have created designated Provider Care Teams.  These Care Teams include your primary Cardiologist (physician) and Advanced Practice Providers (APPs -  Physician Assistants and Nurse Practitioners) who all work together to provide you with the care you need, when you need it. You will need a follow up appointment in 6 weeks.  Please call our office 2 months in advance to schedule this appointment.  You may see Lewayne Bunting, MD or one of the following Advanced Practice Providers on your designated Care Team:   Randall An, PA-C [facility]) . Jacolyn Reedy, PA-C [facility])  Any Other Special Instructions Will Be Listed Below (If Applicable). Thank you for choosing [facility]!     Low-Sodium Eating Plan Sodium, which is an element that makes up salt, helps you maintain a healthy balance of fluids in your body. Too much sodium can increase your blood pressure and cause fluid and waste to be held in your body. Your health care provider or dietitian  may recommend following this plan if you have high blood pressure (hypertension), kidney disease, liver disease, or heart failure. Eating less sodium can help lower your blood pressure, reduce swelling, and protect your heart, liver, and kidneys. What are tips for following this plan? General guidelines  Most people on this plan should limit their sodium intake to 1,500-2,000 mg (milligrams) of sodium each day. Reading food labels  The Nutrition Facts label lists the amount of sodium in one serving of the food. If you eat more than one serving, you must multiply the listed amount of sodium by the number of servings.  Choose foods with less than 140 mg of sodium per serving.  Avoid foods with 300 mg of sodium or more per serving. Shopping  Look for lower-sodium products, often labeled as "low-sodium" or "no salt added."  Always check the sodium content even if foods are labeled as "unsalted" or "no salt added".  Buy fresh foods. ? Avoid canned foods and premade or frozen meals. ? Avoid canned, cured, or processed meats  Buy breads that have less than 80 mg of sodium per slice. Cooking  Eat more home-cooked food and less restaurant, buffet, and fast food.  Avoid adding salt when cooking. Use salt-free seasonings or herbs instead of table salt or sea salt. Check with your health care provider or pharmacist before using salt substitutes.  Cook with plant-based oils, such as canola, sunflower, or olive oil. Meal planning  When eating at a restaurant, ask that your food be  prepared with less salt or no salt, if possible.  Avoid foods that contain MSG (monosodium glutamate). MSG is sometimes added to [place] food, bouillon, and some canned foods. What foods are recommended? The items listed may not be a complete list. Talk with your dietitian about what dietary choices are best for you. Grains Low-sodium cereals, including oats, puffed wheat and rice, and shredded wheat. Low-sodium  crackers. Unsalted rice. Unsalted pasta. Low-sodium bread. Whole-grain breads and whole-grain pasta. Vegetables Fresh or frozen vegetables. "No salt added" canned vegetables. "No salt added" tomato sauce and paste. Low-sodium or reduced-sodium tomato and vegetable juice. Fruits Fresh, frozen, or canned fruit. Fruit juice. Meats and other protein foods Fresh or frozen (no salt added) meat, poultry, seafood, and fish. Low-sodium canned tuna and salmon. Unsalted nuts. Dried peas, beans, and lentils without added salt. Unsalted canned beans. Eggs. Unsalted nut butters. Dairy Milk. Soy milk. Cheese that is naturally low in sodium, such as ricotta cheese, fresh mozzarella, or Swiss cheese Low-sodium or reduced-sodium cheese. Cream cheese. Yogurt. Fats and oils Unsalted butter. Unsalted margarine with no trans fat. Vegetable oils such as canola or olive oils. Seasonings and other foods Fresh and dried herbs and spices. Salt-free seasonings. Low-sodium mustard and ketchup. Sodium-free salad dressing. Sodium-free light mayonnaise. Fresh or refrigerated horseradish. Lemon juice. Vinegar. Homemade, reduced-sodium, or low-sodium soups. Unsalted popcorn and pretzels. Low-salt or salt-free chips. What foods are not recommended? The items listed may not be a complete list. Talk with your dietitian about what dietary choices are best for you. Grains Instant hot cereals. Bread stuffing, pancake, and biscuit mixes. Croutons. Seasoned rice or pasta mixes. Noodle soup cups. Boxed or frozen macaroni and cheese. Regular salted crackers. Self-rising flour. Vegetables Sauerkraut, pickled vegetables, and relishes. Olives. [place] fries. Onion rings. Regular canned vegetables (not low-sodium or reduced-sodium). Regular canned tomato sauce and paste (not low-sodium or reduced-sodium). Regular tomato and vegetable juice (not low-sodium or reduced-sodium). Frozen vegetables in sauces. Meats and other protein foods Meat or  fish that is salted, canned, smoked, spiced, or pickled. Bacon, ham, sausage, hotdogs, corned beef, chipped beef, packaged lunch meats, salt pork, jerky, pickled herring, anchovies, regular canned tuna, sardines, salted nuts. Dairy Processed cheese and cheese spreads. Cheese curds. Blue cheese. Feta cheese. String cheese. Regular cottage cheese. Buttermilk. Canned milk. Fats and oils Salted butter. Regular margarine. Ghee. Bacon fat. Seasonings and other foods Onion salt, garlic salt, seasoned salt, table salt, and sea salt. Canned and packaged gravies. Worcestershire sauce. Tartar sauce. Barbecue sauce. Teriyaki sauce. Soy sauce, including reduced-sodium. Steak sauce. Fish sauce. Oyster sauce. Cocktail sauce. Horseradish that you find on the shelf. Regular ketchup and mustard. Meat flavorings and tenderizers. Bouillon cubes. Hot sauce and Tabasco sauce. Premade or packaged marinades. Premade or packaged taco seasonings. Relishes. Regular salad dressings. Salsa. Potato and tortilla chips. Corn chips and puffs. Salted popcorn and pretzels. Canned or dried soups. Pizza. Frozen entrees and pot pies. Summary  Eating less sodium can help lower your blood pressure, reduce swelling, and protect your heart, liver, and kidneys.  Most people on this plan should limit their sodium intake to 1,500-2,000 mg (milligrams) of sodium each day.  Canned, boxed, and frozen foods are high in sodium. Restaurant foods, fast foods, and pizza are also very high in sodium. You also get sodium by adding salt to food.  Try to cook at home, eat more fresh fruits and vegetables, and eat less fast food, canned, processed, or prepared foods. This information is not intended to  replace advice given to you by your health care provider. Make sure you discuss any questions you have with your health care provider. Document Released: 06/27/2001 Document Revised: 12/30/2015 Document Reviewed: 12/30/2015 Elsevier Interactive Patient  Education  [employer].

## 2018-01-12 ENCOUNTER — Encounter (HOSPITAL_COMMUNITY): Payer: Self-pay

## 2018-01-12 ENCOUNTER — Other Ambulatory Visit: Payer: Self-pay

## 2018-01-12 ENCOUNTER — Inpatient Hospital Stay (HOSPITAL_COMMUNITY)
Admission: EM | Admit: 2018-01-12 | Discharge: 2018-01-24 | DRG: 291 | Disposition: A | Discharge status: Home Health | Payer: Medicaid Other | Attending: Internal Medicine | Admitting: Internal Medicine

## 2018-01-12 ENCOUNTER — Emergency Department (HOSPITAL_COMMUNITY): Payer: Medicaid Other

## 2018-01-12 DIAGNOSIS — N183 Chronic kidney disease, stage 3 (moderate): Secondary | ICD-10-CM | POA: Diagnosis present

## 2018-01-12 DIAGNOSIS — M797 Fibromyalgia: Secondary | ICD-10-CM | POA: Diagnosis present

## 2018-01-12 DIAGNOSIS — Z886 Allergy status to analgesic agent status: Secondary | ICD-10-CM

## 2018-01-12 DIAGNOSIS — I130 Hypertensive heart and chronic kidney disease with heart failure and stage 1 through stage 4 chronic kidney disease, or unspecified chronic kidney disease: Secondary | ICD-10-CM | POA: Diagnosis present

## 2018-01-12 DIAGNOSIS — I472 Ventricular tachycardia: Secondary | ICD-10-CM | POA: Diagnosis present

## 2018-01-12 DIAGNOSIS — Z8673 Personal history of transient ischemic attack (TIA), and cerebral infarction without residual deficits: Secondary | ICD-10-CM | POA: Diagnosis not present

## 2018-01-12 DIAGNOSIS — Z7902 Long term (current) use of antithrombotics/antiplatelets: Secondary | ICD-10-CM | POA: Diagnosis not present

## 2018-01-12 DIAGNOSIS — I5023 Acute on chronic systolic (congestive) heart failure: Secondary | ICD-10-CM

## 2018-01-12 DIAGNOSIS — Z880 Allergy status to penicillin: Secondary | ICD-10-CM

## 2018-01-12 DIAGNOSIS — Z79899 Other long term (current) drug therapy: Secondary | ICD-10-CM

## 2018-01-12 DIAGNOSIS — E1122 Type 2 diabetes mellitus with diabetic chronic kidney disease: Secondary | ICD-10-CM | POA: Diagnosis present

## 2018-01-12 DIAGNOSIS — Z794 Long term (current) use of insulin: Secondary | ICD-10-CM | POA: Diagnosis not present

## 2018-01-12 DIAGNOSIS — E1165 Type 2 diabetes mellitus with hyperglycemia: Secondary | ICD-10-CM | POA: Diagnosis present

## 2018-01-12 DIAGNOSIS — I361 Nonrheumatic tricuspid (valve) insufficiency: Secondary | ICD-10-CM | POA: Diagnosis not present

## 2018-01-12 DIAGNOSIS — F419 Anxiety disorder, unspecified: Secondary | ICD-10-CM | POA: Diagnosis present

## 2018-01-12 DIAGNOSIS — E7800 Pure hypercholesterolemia, unspecified: Secondary | ICD-10-CM | POA: Diagnosis present

## 2018-01-12 DIAGNOSIS — R609 Edema, unspecified: Secondary | ICD-10-CM

## 2018-01-12 DIAGNOSIS — Z87891 Personal history of nicotine dependence: Secondary | ICD-10-CM | POA: Diagnosis not present

## 2018-01-12 DIAGNOSIS — E785 Hyperlipidemia, unspecified: Secondary | ICD-10-CM | POA: Diagnosis present

## 2018-01-12 DIAGNOSIS — E875 Hyperkalemia: Secondary | ICD-10-CM | POA: Diagnosis present

## 2018-01-12 DIAGNOSIS — Z6841 Body Mass Index (BMI) 40.0 and over, adult: Secondary | ICD-10-CM

## 2018-01-12 DIAGNOSIS — N179 Acute kidney failure, unspecified: Secondary | ICD-10-CM | POA: Diagnosis present

## 2018-01-12 DIAGNOSIS — Z91013 Allergy to seafood: Secondary | ICD-10-CM

## 2018-01-12 DIAGNOSIS — I5043 Acute on chronic combined systolic (congestive) and diastolic (congestive) heart failure: Secondary | ICD-10-CM | POA: Diagnosis present

## 2018-01-12 DIAGNOSIS — I428 Other cardiomyopathies: Secondary | ICD-10-CM | POA: Diagnosis present

## 2018-01-12 DIAGNOSIS — K219 Gastro-esophageal reflux disease without esophagitis: Secondary | ICD-10-CM | POA: Diagnosis present

## 2018-01-12 DIAGNOSIS — Z8249 Family history of ischemic heart disease and other diseases of the circulatory system: Secondary | ICD-10-CM

## 2018-01-12 DIAGNOSIS — E11649 Type 2 diabetes mellitus with hypoglycemia without coma: Secondary | ICD-10-CM | POA: Diagnosis present

## 2018-01-12 DIAGNOSIS — Z95810 Presence of automatic (implantable) cardiac defibrillator: Secondary | ICD-10-CM

## 2018-01-12 DIAGNOSIS — I10 Essential (primary) hypertension: Secondary | ICD-10-CM | POA: Diagnosis not present

## 2018-01-12 DIAGNOSIS — M329 Systemic lupus erythematosus, unspecified: Secondary | ICD-10-CM | POA: Diagnosis not present

## 2018-01-12 DIAGNOSIS — Z7951 Long term (current) use of inhaled steroids: Secondary | ICD-10-CM | POA: Diagnosis not present

## 2018-01-12 HISTORY — DX: Chronic kidney disease, stage 3 unspecified: N18.30

## 2018-01-12 HISTORY — DX: Hyperlipidemia, unspecified: E78.5

## 2018-01-12 HISTORY — DX: Personal history of pneumonia (recurrent): Z87.01

## 2018-01-12 HISTORY — DX: Chronic kidney disease, stage 3 (moderate): N18.3

## 2018-01-12 HISTORY — DX: Type 2 diabetes mellitus without complications: E11.9

## 2018-01-12 HISTORY — DX: Personal history of other specified conditions: Z87.898

## 2018-01-12 LAB — CBC WITH DIFFERENTIAL/PLATELET
Abs Immature Granulocytes: 0.02 10*3/uL (ref 0.00–0.07)
Basophils Absolute: 0 10*3/uL (ref 0.0–0.1)
Basophils Relative: 1 %
Eosinophils Absolute: 0.1 10*3/uL (ref 0.0–0.5)
Eosinophils Relative: 2 %
HCT: 40.3 % (ref 36.0–46.0)
Hemoglobin: 12.9 g/dL (ref 12.0–15.0)
Immature Granulocytes: 0 %
LYMPHS PCT: 40 %
Lymphs Abs: 2.2 10*3/uL (ref 0.7–4.0)
MCH: 29.4 pg (ref 26.0–34.0)
MCHC: 32 g/dL (ref 30.0–36.0)
MCV: 91.8 fL (ref 80.0–100.0)
Monocytes Absolute: 0.5 10*3/uL (ref 0.1–1.0)
Monocytes Relative: 10 %
NRBC: 0.7 % — AB (ref 0.0–0.2)
Neutro Abs: 2.6 10*3/uL (ref 1.7–7.7)
Neutrophils Relative %: 47 %
Platelets: 240 10*3/uL (ref 150–400)
RBC: 4.39 MIL/uL (ref 3.87–5.11)
RDW: 22.5 % — ABNORMAL HIGH (ref 11.5–15.5)
WBC: 5.4 10*3/uL (ref 4.0–10.5)

## 2018-01-12 LAB — GLUCOSE, CAPILLARY
Glucose-Capillary: 115 mg/dL — ABNORMAL HIGH (ref 70–99)
Glucose-Capillary: 89 mg/dL (ref 70–99)

## 2018-01-12 LAB — BRAIN NATRIURETIC PEPTIDE: B Natriuretic Peptide: 2922 pg/mL — ABNORMAL HIGH (ref 0.0–100.0)

## 2018-01-12 LAB — COMPREHENSIVE METABOLIC PANEL
ALT: 38 U/L (ref 0–44)
AST: 52 U/L — ABNORMAL HIGH (ref 15–41)
Albumin: 2.9 g/dL — ABNORMAL LOW (ref 3.5–5.0)
Alkaline Phosphatase: 127 U/L — ABNORMAL HIGH (ref 38–126)
Anion gap: 8 (ref 5–15)
BUN: 22 mg/dL — ABNORMAL HIGH (ref 6–20)
CO2: 21 mmol/L — AB (ref 22–32)
Calcium: 8.2 mg/dL — ABNORMAL LOW (ref 8.9–10.3)
Chloride: 108 mmol/L (ref 98–111)
Creatinine, Ser: 1.65 mg/dL — ABNORMAL HIGH (ref 0.44–1.00)
GFR calc Af Amer: 40 mL/min — ABNORMAL LOW (ref >60)
GFR calc non Af Amer: 34 mL/min — ABNORMAL LOW (ref >60)
GLUCOSE: 148 mg/dL — AB (ref 70–99)
Potassium: 5.2 mmol/L — ABNORMAL HIGH (ref 3.5–5.1)
Sodium: 137 mmol/L (ref 135–145)
Total Bilirubin: 3.4 mg/dL — ABNORMAL HIGH (ref 0.3–1.2)
Total Protein: 7 g/dL (ref 6.5–8.1)

## 2018-01-12 LAB — TROPONIN I: Troponin I: <0.03 ng/mL (ref <0.03)

## 2018-01-12 MED ORDER — SPIRONOLACTONE 25 MG PO TABS
25.0000 mg | ORAL_TABLET | Freq: Every day | ORAL | Status: DC
  Administered 2018-01-13 – 2018-01-14 (×2): 25 mg via ORAL
  Filled 2018-01-12 (×3): qty 1

## 2018-01-12 MED ORDER — NITROGLYCERIN 2 % TD OINT
0.5000 [in_us] | TOPICAL_OINTMENT | Freq: Once | TRANSDERMAL | Status: AC
  Administered 2018-01-12: 0.5 [in_us] via TOPICAL
  Filled 2018-01-12: qty 1

## 2018-01-12 MED ORDER — INSULIN ASPART PROT & ASPART (70-30 MIX) 100 UNIT/ML ~~LOC~~ SUSP
30.0000 [IU] | Freq: Two times a day (BID) | SUBCUTANEOUS | Status: DC
  Filled 2018-01-12 (×2): qty 10

## 2018-01-12 MED ORDER — VENLAFAXINE HCL ER 75 MG PO CP24
150.0000 mg | ORAL_CAPSULE | Freq: Every day | ORAL | Status: DC
  Administered 2018-01-12 – 2018-01-24 (×13): 150 mg via ORAL
  Filled 2018-01-12 (×13): qty 2

## 2018-01-12 MED ORDER — PROMETHAZINE HCL 25 MG/ML IJ SOLN
12.5000 mg | Freq: Four times a day (QID) | INTRAMUSCULAR | Status: DC | PRN
  Administered 2018-01-12 – 2018-01-24 (×6): 12.5 mg via INTRAVENOUS
  Filled 2018-01-12 (×6): qty 1

## 2018-01-12 MED ORDER — INSULIN ASPART 100 UNIT/ML ~~LOC~~ SOLN: 0.0000 [IU] | Freq: Every day | SUBCUTANEOUS | Status: DC

## 2018-01-12 MED ORDER — LORATADINE 10 MG PO TABS
10.0000 mg | ORAL_TABLET | Freq: Every day | ORAL | Status: DC
  Administered 2018-01-12 – 2018-01-24 (×13): 10 mg via ORAL
  Filled 2018-01-12 (×13): qty 1

## 2018-01-12 MED ORDER — INSULIN ASPART 100 UNIT/ML ~~LOC~~ SOLN
0.0000 [IU] | Freq: Three times a day (TID) | SUBCUTANEOUS | Status: DC
  Administered 2018-01-14: 2 [IU] via SUBCUTANEOUS
  Administered 2018-01-14: 3 [IU] via SUBCUTANEOUS
  Administered 2018-01-15: 2 [IU] via SUBCUTANEOUS
  Administered 2018-01-15 (×2): 3 [IU] via SUBCUTANEOUS
  Administered 2018-01-16: 2 [IU] via SUBCUTANEOUS
  Administered 2018-01-16: 3 [IU] via SUBCUTANEOUS
  Administered 2018-01-16: 2 [IU] via SUBCUTANEOUS
  Administered 2018-01-17: 3 [IU] via SUBCUTANEOUS
  Administered 2018-01-17: 2 [IU] via SUBCUTANEOUS
  Administered 2018-01-18 – 2018-01-19 (×2): 3 [IU] via SUBCUTANEOUS
  Administered 2018-01-19: 2 [IU] via SUBCUTANEOUS
  Administered 2018-01-19: 3 [IU] via SUBCUTANEOUS
  Administered 2018-01-20: 2 [IU] via SUBCUTANEOUS
  Administered 2018-01-20 – 2018-01-21 (×2): 3 [IU] via SUBCUTANEOUS
  Administered 2018-01-21: 2 [IU] via SUBCUTANEOUS
  Administered 2018-01-21 – 2018-01-22 (×2): 3 [IU] via SUBCUTANEOUS
  Administered 2018-01-23: 2 [IU] via SUBCUTANEOUS
  Administered 2018-01-23: 3 [IU] via SUBCUTANEOUS
  Administered 2018-01-23: 2 [IU] via SUBCUTANEOUS
  Administered 2018-01-24 (×2): 3 [IU] via SUBCUTANEOUS

## 2018-01-12 MED ORDER — ACETAMINOPHEN 325 MG PO TABS
650.0000 mg | ORAL_TABLET | ORAL | Status: DC | PRN
  Filled 2018-01-12: qty 2

## 2018-01-12 MED ORDER — SIMVASTATIN 20 MG PO TABS
20.0000 mg | ORAL_TABLET | Freq: Every day | ORAL | Status: DC
  Administered 2018-01-12 – 2018-01-23 (×12): 20 mg via ORAL
  Filled 2018-01-12 (×12): qty 1

## 2018-01-12 MED ORDER — HEPARIN SODIUM (PORCINE) 5000 UNIT/ML IJ SOLN
5000.0000 [IU] | Freq: Three times a day (TID) | INTRAMUSCULAR | Status: DC
  Administered 2018-01-12 – 2018-01-24 (×36): 5000 [IU] via SUBCUTANEOUS
  Filled 2018-01-12 (×36): qty 1

## 2018-01-12 MED ORDER — ONDANSETRON HCL 4 MG/2ML IJ SOLN: 4.0000 mg | Freq: Four times a day (QID) | INTRAMUSCULAR | Status: DC | PRN

## 2018-01-12 MED ORDER — FUROSEMIDE 10 MG/ML IJ SOLN
40.0000 mg | Freq: Two times a day (BID) | INTRAMUSCULAR | Status: DC
  Administered 2018-01-12 – 2018-01-13 (×3): 40 mg via INTRAVENOUS
  Filled 2018-01-12 (×3): qty 4

## 2018-01-12 MED ORDER — METOPROLOL SUCCINATE ER 50 MG PO TB24
100.0000 mg | ORAL_TABLET | Freq: Every day | ORAL | Status: DC
  Administered 2018-01-12 – 2018-01-14 (×3): 100 mg via ORAL
  Filled 2018-01-12 (×3): qty 2

## 2018-01-12 MED ORDER — ONDANSETRON HCL 4 MG/2ML IJ SOLN
4.0000 mg | Freq: Four times a day (QID) | INTRAMUSCULAR | Status: DC | PRN
  Administered 2018-01-12: 4 mg via INTRAVENOUS
  Filled 2018-01-12: qty 2

## 2018-01-12 MED ORDER — SODIUM CHLORIDE 0.9% FLUSH: 3.0000 mL | INTRAVENOUS | Status: DC | PRN

## 2018-01-12 MED ORDER — CLOPIDOGREL BISULFATE 75 MG PO TABS
75.0000 mg | ORAL_TABLET | Freq: Every day | ORAL | Status: DC
  Administered 2018-01-12 – 2018-01-24 (×13): 75 mg via ORAL
  Filled 2018-01-12 (×12): qty 1

## 2018-01-12 MED ORDER — SODIUM CHLORIDE 0.9% FLUSH
3.0000 mL | Freq: Two times a day (BID) | INTRAVENOUS | Status: DC
  Administered 2018-01-12 – 2018-01-24 (×10): 3 mL via INTRAVENOUS

## 2018-01-12 MED ORDER — MOMETASONE FURO-FORMOTEROL FUM 200-5 MCG/ACT IN AERO
2.0000 | INHALATION_SPRAY | Freq: Two times a day (BID) | RESPIRATORY_TRACT | Status: DC
  Administered 2018-01-12 – 2018-01-24 (×24): 2 via RESPIRATORY_TRACT
  Filled 2018-01-12: qty 8.8

## 2018-01-12 MED ORDER — FUROSEMIDE 10 MG/ML IJ SOLN
60.0000 mg | Freq: Once | INTRAMUSCULAR | Status: AC
  Administered 2018-01-12: 60 mg via INTRAVENOUS
  Filled 2018-01-12: qty 6

## 2018-01-12 MED ORDER — SODIUM CHLORIDE 0.9 % IV SOLN: 250.0000 mL | INTRAVENOUS | Status: DC | PRN

## 2018-01-12 NOTE — ED Notes (Signed)
Pt resting comfortably

## 2018-01-12 NOTE — ED Notes (Signed)
Pt refusing to walk. States she has not walked in 3 weeks.

## 2018-01-12 NOTE — H&P (Signed)
History and Physical    Katherine [name] ZOX:[id number] DOB: Dec 28, 1960 DOA: 01/12/2018  PCP: Alvina Filbert, MD  Patient coming from: Home  I have personally briefly reviewed patient's old medical records in [facility]  Chief Complaint: Shortness of breath  HPI: Katherine [name] is a 57 y.o. female with medical history significant of chronic systolic congestive heart failure ejection fraction of 30 to 35% on echo from 2012, lupus, chronic kidney disease stage III, hypertension diabetes, presents to the hospital with complaints of increasing shortness of breath.  Patient reports that she has been short of breath since October of this year.  Her symptoms of gotten worse over the past week.  She is noticed worsening lower extremity edema.  She has 4 pillow orthopnea.  She denies any chest discomfort.  She does have cough and has been feeling feverish lately.  She did not have any dysuria.  She has had some nausea and vomiting.  No diarrhea.  She has been feeling increasingly weak.  She has difficulty walking due to shortness of breath as well as heaviness in her legs.  ED Course: EKG did not show any acute findings.  Troponin negative.  BNP was elevated.  Chest x-ray indicated CHF.  She received a dose of intravenous Lasix and has been referred for admission.  Review of Systems: As per HPI otherwise 10 point review of systems negative.    Past Medical History:  Diagnosis Date  . Angina   . Anxiety   . Arthritis   . Asthma   . Bronchitis   . CHF (congestive heart failure) (HCC)    a. EF 20-25% by cath in 11/2010 with cath showing normal cors  . Chronic kidney disease    chronic  . Diabetes mellitus   . Fibromyalgia   . Fibromyalgia 10/03/2012  . GERD (gastroesophageal reflux disease)   . Hallux valgus of left foot   . Hay fever   . Headache(784.0)   . Heart disease   . High cholesterol   . Hypertension   . Lupus (HCC)   . Mental disorder    anxiety  . Nonischemic  cardiomyopathy (HCC)    NYHA classII  . Obesity   . Pneumonia   . Seizures (HCC)    per patient statement  . Shortness of breath   . Stroke (HCC)   . Systemic lupus erythematosus (HCC)     Past Surgical History:  Procedure Laterality Date  . ICD  02/13/2011   Medtronic Protecta DR XT  . ICD GENERATOR CHANGEOUT N/A 01/07/2017   Procedure: ICD GENERATOR CHANGEOUT;  Surgeon: Marinus Maw, MD;  Location: [facility] INVASIVE CV LAB;  Service: Cardiovascular;  Laterality: N/A;  . IMPLANTABLE CARDIOVERTER DEFIBRILLATOR IMPLANT N/A 02/13/2011   Procedure: IMPLANTABLE CARDIOVERTER DEFIBRILLATOR IMPLANT;  Surgeon: Thurmon Fair, MD;  Location: [facility] CATH LAB;  Service: Cardiovascular;  Laterality: N/A;  . LEFT HEART CATHETERIZATION WITH CORONARY ANGIOGRAM N/A 12/05/2010   Procedure: LEFT HEART CATHETERIZATION WITH CORONARY ANGIOGRAM;  Surgeon: Chrystie Nose, MD;  Location: [facility] CATH LAB;  Service: Cardiovascular;  Laterality: N/A;  . RIGHT ANKLE    . TUBAL LIGATION    . US ECHOCARDIOGRAPHY  11/09/2010   mild LV enlargement w/conc. LVH & severe global hypokinesis EF 30-35%,mod. diastolic dysfunction, mod. TR,mild AI,mild to mod MR,mild PI    Social History:  reports that she quit smoking about 12 years ago. Her smoking use included cigarettes. She started smoking about 38 years ago. She has  a 26.00 pack-year smoking history. She has never used smokeless tobacco. She reports previous alcohol use. She reports that she does not use drugs.  Allergies  Allergen Reactions  . Aspirin Other (See Comments)  . Penicillins Itching    Can take amoxicillin without a reaction  . Shellfish Allergy Other (See Comments)    Causes to have grand mal seizures    Family History  Problem Relation Age of Onset  . Hypertension Sister   . Hypertension Brother   . Hypertension Daughter     Prior to Admission medications   Medication Sig Start Date End Date Taking? Authorizing Provider  ACCU-CHEK SOFTCLIX LANCETS  lancets USE AS DIRECTED 02/12/16   Roma Kayser, MD  albuterol (PROVENTIL HFA;VENTOLIN HFA) 108 (90 BASE) MCG/ACT inhaler Inhale 2 puffs into the lungs 4 (four) times daily as needed. For wheezing and shortness of breath    [provider]  amiodarone (PACERONE) 200 MG tablet Take 1 tablet (200 mg total) by mouth daily. 12/10/17   Strader, Lennart Pall, PA-C  cetirizine (ZYRTEC) 10 MG tablet Take 10 mg by mouth daily.     [provider]  clobetasol cream (TEMOVATE) 0.05 % Apply 1 application topically 2 (two) times daily as needed.    [provider]  cloNIDine (CATAPRES) 0.2 MG tablet Take 1 tablet (0.2 mg total) by mouth 2 (two) times daily. 10/13/17   Marinus Maw, MD  clopidogrel (PLAVIX) 75 MG tablet Take 1 tablet (75 mg total) by mouth daily. 09/23/17   Marinus Maw, MD  COD LIVER OIL PO Take by mouth.    [provider]  DULoxetine HCl (CYMBALTA PO) Take by mouth 1 day or 1 dose.    [provider]  Fluticasone-Salmeterol (ADVAIR DISKUS) 500-50 MCG/DOSE AEPB Inhale 1 puff into the lungs 2 (two) times daily.    [provider]  furosemide (LASIX) 40 MG tablet Take 1 tablet (40 mg total) by mouth 2 (two) times daily. 12/10/17   Strader, Lennart Pall, PA-C  HUMALOG MIX 50/50 KWIKPEN (50-50) 100 UNIT/ML Kwikpen INJECT 30 UNITS INTO THE SKIN TWICE DAILY. 01/28/16   Roma Kayser, MD  losartan (COZAAR) 100 MG tablet Take 1 tablet (100 mg total) by mouth daily. 12/10/17 03/10/18  Ellsworth Lennox, PA-C  metoprolol succinate (TOPROL-XL) 100 MG 24 hr tablet Take 1 tablet (100 mg total) by mouth daily. Take with or immediately following a meal. 12/10/17 03/10/18  Strader, Lennart Pall, PA-C  Multiple Vitamins-Minerals (MULTIVITAMIN WOMEN 50+ PO) Take 1 tablet by mouth daily.    [provider]  pimecrolimus (ELIDEL) 1 % cream Apply 1 application topically 2 (two) times daily.     [provider]  promethazine  (PHENERGAN) 25 MG tablet Take 25 mg by mouth as needed. nausea     [provider]  simvastatin (ZOCOR) 20 MG tablet TAKE ONE TABLET BY MOUTH AT BEDTIME. 11/24/17   Marinus Maw, MD  spironolactone (ALDACTONE) 25 MG tablet TAKE 1 TABLET BY MOUTH ONCE DAILY. 10/12/17   Marinus Maw, MD  ULTICARE SHORT PEN NEEDLES 31G X 8 MM MISC USE AS DIRECTED 12/30/15   Roma Kayser, MD  venlafaxine XR (EFFEXOR-XR) 150 MG 24 hr capsule Take 150 mg by mouth daily.     [provider]    Physical Exam: Vitals:   01/12/18 0630 01/12/18 0700 01/12/18 0730 01/12/18 0816  BP: 106/63 124/86 109/70 97/69  Pulse: 65 67 67 65  Resp: 12 15 16 16   Temp:      TempSrc:      SpO2:    100%  Weight:      Height:        Constitutional: NAD, calm, comfortable Eyes: PERRL, lids and conjunctivae normal ENMT: Mucous membranes are moist. Posterior pharynx clear of any exudate or lesions.Normal dentition.  Neck: normal, supple, no masses, no thyromegaly Respiratory: Crackles at bases. Normal respiratory effort. No accessory muscle use.  Cardiovascular: Regular rate and rhythm, no murmurs / rubs / gallops.  1-2+ extremity edema. 2+ pedal pulses. No carotid bruits.  Abdomen: no tenderness, no masses palpated. No hepatosplenomegaly. Bowel sounds positive.  Musculoskeletal: no clubbing / cyanosis. No joint deformity upper and lower extremities. Good ROM, no contractures. Normal muscle tone.  Skin: no rashes, lesions, ulcers. No induration Neurologic: CN 2-12 grossly intact. Sensation intact, DTR normal. Strength 5/5 in all 4.  Psychiatric: Normal judgment and insight. Alert and oriented x 3. Normal mood.    Labs on Admission: I have personally reviewed following labs and imaging studies  CBC: Recent Labs  Lab 01/12/18 0410  WBC 5.4  NEUTROABS 2.6  HGB 12.9  HCT 40.3  MCV 91.8  PLT 240   Basic Metabolic Panel: Recent Labs  Lab 01/12/18 0410  NA 137  K 5.2*  CL 108  CO2 21*    GLUCOSE 148*  BUN 22*  CREATININE 1.65*  CALCIUM 8.2*   GFR: Estimated Creatinine Clearance: 56.1 mL/min (A) (by C-G formula based on SCr of 1.65 mg/dL (H)). Liver Function Tests: Recent Labs  Lab 01/12/18 0410  AST 52*  ALT 38  ALKPHOS 127*  BILITOT 3.4*  PROT 7.0  ALBUMIN 2.9*   No results for input(s): LIPASE, AMYLASE in the last 168 hours. No results for input(s): AMMONIA in the last 168 hours. Coagulation Profile: No results for input(s): INR, PROTIME in the last 168 hours. Cardiac Enzymes: Recent Labs  Lab 01/12/18 0410  TROPONINI <0.03   BNP (last 3 results) No results for input(s): PROBNP in the last 8760 hours. HbA1C: No results for input(s): HGBA1C in the last 72 hours. CBG: No results for input(s): GLUCAP in the last 168 hours. Lipid Profile: No results for input(s): CHOL, HDL, LDLCALC, TRIG, CHOLHDL, LDLDIRECT in the last 72 hours. Thyroid Function Tests: No results for input(s): TSH, T4TOTAL, FREET4, T3FREE, THYROIDAB in the last 72 hours. Anemia Panel: No results for input(s): VITAMINB12, FOLATE, FERRITIN, TIBC, IRON, RETICCTPCT in the last 72 hours. Urine analysis: No results found for: COLORURINE, APPEARANCEUR, LABSPEC, PHURINE, GLUCOSEU, HGBUR, BILIRUBINUR, KETONESUR, PROTEINUR, UROBILINOGEN, NITRITE, LEUKOCYTESUR  Radiological Exams on Admission: Dg Chest 2 View  Result Date: 01/12/2018 CLINICAL DATA:  Acute onset of shortness of breath and lower leg pain and swelling. EXAM: CHEST - 2 VIEW COMPARISON:  Chest radiograph performed 02/14/2011 FINDINGS: The lungs are hypoexpanded. Vascular congestion is seen. Hazy bilateral airspace opacities raise concern for pulmonary edema. A small left pleural effusion is noted. No pneumothorax is seen. The cardiomediastinal silhouette is mildly enlarged. A pacemaker/AICD is noted at the left chest wall, with leads ending at the right atrium and right ventricle. No acute osseous abnormalities are identified.  IMPRESSION: Lungs hypoexpanded. Vascular congestion and mild cardiomegaly. Hazy bilateral airspace opacities raise concern for pulmonary edema. Small left pleural effusion noted. Electronically Signed   By: Roanna RaiderJeffery  Chang M.D.   On: 01/12/2018 04:59    EKG: Independently reviewed.  Sinus rhythm without any acute changes.  Prolonged QT  Assessment/Plan Active Problems:   Non-ischemic cardiomyopathy (HCC)   AICD (automatic cardioverter/defibrillator) present   Diabetes mellitus with stage 3 chronic kidney disease (HCC)   Dyslipidemia   Lupus (HCC)   Acute on chronic systolic congestive heart failure, NYHA class 2 (HCC)   Morbid obesity due to excess calories (HCC)   Acute on chronic systolic CHF (congestive heart failure) (HCC)     1. Acute on chronic systolic congestive heart failure.  Will obtain echocardiogram.  Continue on intravenous Lasix.  Monitor intake and output.  Continue beta-blockers.  Hold ARB in light of renal dysfunction.  Continue on spironolactone. 2. Lupus.  Continue on Plaquenil.  No evidence of flare at this time. 3. Chronic kidney disease stage III.  Creatinine is currently at baseline.  Continue to monitor in the setting of diuresis. 4. Diabetes.  Continue on insulin.  Start on sliding scale.  Monitor blood sugars. 5. Hyperlipidemia.  Continue statin. 6. Hypertension.  Will hold on clonidine and losartan for now.  Continue on beta-blockers. 7. AICD.  History of ventricular tachycardia.  Was recently started on amiodarone by her electrophysiologist, but she stopped taking this 1 week ago due to GI upset.  DVT prophylaxis: Heparin Code Status: Full code Family Communication: No family present Disposition Plan: Discharge home once improved Consults called:   Admission status: Inpatient, telemetry  Erick Blinks MD [facility] Pager [phone]  If 7PM-7AM, please contact night-coverage [email or web address] Password [facility]  01/12/2018, 2:07 PM

## 2018-01-12 NOTE — ED Provider Notes (Signed)
[facility] EMERGENCY DEPARTMENT Provider Note   CSN: [id number] Arrival date & time: 01/12/18  8295  Time seen 3:42 AM   History   Chief Complaint Chief Complaint  Patient presents with  . Shortness of Breath  . Leg Swelling    HPI Katherine [name] is a 57 y.o. female.  HPI patient has a history of congestive heart failure and is followed by [name], cardiology.  She has an ICD for V. tach.  She states the last time it went off was before October.  She states she had been admitted about 3 months ago in [place] and had some of her medications switched around which caused her a lots of vomiting and diarrhea.  She was seen 3 weeks ago by [name] and had her medications switch back to her usual medications.  She states they also tried starting her on amiodarone but she is unable to tolerate it so she is not taking it.  She does not weigh herself daily anymore.  She states 3 days ago she started noticing increased swelling of her feet and legs.  She states she also feels like her abdomen has bloating off and on.  She has been having worsening shortness of breath but states she is not having any pulmonary congestion.  She states she is taking her fluid pill but she had the dose decrease in October because she had some kidney problems with the new medication she had been on.  She does report a dry cough, she thinks she has a fever because she feels hot but she has not checked her temperature.  She states she is on Plaquenil for her lupus and she thinks the feeling hot and feverish is from her lupus.  Past Medical History:  Diagnosis Date  . Angina   . Anxiety   . Arthritis   . Asthma   . Bronchitis   . CHF (congestive heart failure) (HCC)    a. EF 20-25% by cath in 11/2010 with cath showing normal cors  . Chronic kidney disease    chronic  . Diabetes mellitus   . Fibromyalgia   . Fibromyalgia 10/03/2012  . GERD (gastroesophageal reflux disease)   . Hallux valgus of left foot     . Hay fever   . Headache(784.0)   . Heart disease   . High cholesterol   . Hypertension   . Lupus (HCC)   . Mental disorder    anxiety  . Nonischemic cardiomyopathy (HCC)    NYHA classII  . Obesity   . Pneumonia   . Seizures (HCC)    per patient statement  . Shortness of breath   . Stroke (HCC)   . Systemic lupus erythematosus (HCC)     Patient Active Problem List   Diagnosis Date Noted  . Morbid obesity due to excess calories (HCC) 09/24/2015  . Personal history of noncompliance with medical treatment, presenting hazards to health 02/19/2015  . Acute on chronic systolic congestive heart failure, NYHA class 2 (HCC) 11/17/2012  . Non-ischemic cardiomyopathy (HCC) 10/03/2012  . AICD (automatic cardioverter/defibrillator) present 10/03/2012  . HTN (hypertension) 10/03/2012  . Diabetes mellitus with stage 3 chronic kidney disease (HCC) 10/03/2012  . Fibromyalgia 10/03/2012  . Dyslipidemia 10/03/2012  . Lupus (HCC) 10/03/2012  . Stroke [facility]) 10/03/2012    Past Surgical History:  Procedure Laterality Date  . ICD  02/13/2011   Medtronic Protecta DR XT  . ICD GENERATOR CHANGEOUT N/A 01/07/2017   Procedure: ICD GENERATOR  CHANGEOUT;  Surgeon: Marinus Maw, MD;  Location: [facility] INVASIVE CV LAB;  Service: Cardiovascular;  Laterality: N/A;  . IMPLANTABLE CARDIOVERTER DEFIBRILLATOR IMPLANT N/A 02/13/2011   Procedure: IMPLANTABLE CARDIOVERTER DEFIBRILLATOR IMPLANT;  Surgeon: Thurmon Fair, MD;  Location: [facility] CATH LAB;  Service: Cardiovascular;  Laterality: N/A;  . LEFT HEART CATHETERIZATION WITH CORONARY ANGIOGRAM N/A 12/05/2010   Procedure: LEFT HEART CATHETERIZATION WITH CORONARY ANGIOGRAM;  Surgeon: Chrystie Nose, MD;  Location: [facility] CATH LAB;  Service: Cardiovascular;  Laterality: N/A;  . RIGHT ANKLE    . TUBAL LIGATION    . US ECHOCARDIOGRAPHY  11/09/2010   mild LV enlargement w/conc. LVH & severe global hypokinesis EF 30-35%,mod. diastolic dysfunction, mod. TR,mild AI,mild to  mod MR,mild PI     OB History    Gravida  3   Para      Term      Preterm      AB      Living        SAB      TAB      Ectopic      Multiple      Live Births               Home Medications    Plaquenil  Prior to Admission medications   Medication Sig Start Date End Date Taking? Authorizing Provider  ACCU-CHEK SOFTCLIX LANCETS lancets USE AS DIRECTED 02/12/16   Roma Kayser, MD  albuterol (PROVENTIL HFA;VENTOLIN HFA) 108 (90 BASE) MCG/ACT inhaler Inhale 2 puffs into the lungs 4 (four) times daily as needed. For wheezing and shortness of breath    [provider]  amiodarone (PACERONE) 200 MG tablet Take 1 tablet (200 mg total) by mouth daily. 12/10/17   Strader, Lennart Pall, PA-C  cetirizine (ZYRTEC) 10 MG tablet Take 10 mg by mouth daily.     [provider]  clobetasol cream (TEMOVATE) 0.05 % Apply 1 application topically 2 (two) times daily as needed.    [provider]  cloNIDine (CATAPRES) 0.2 MG tablet Take 1 tablet (0.2 mg total) by mouth 2 (two) times daily. 10/13/17   Marinus Maw, MD  clopidogrel (PLAVIX) 75 MG tablet Take 1 tablet (75 mg total) by mouth daily. 09/23/17   Marinus Maw, MD  COD LIVER OIL PO Take by mouth.    [provider]  DULoxetine HCl (CYMBALTA PO) Take by mouth 1 day or 1 dose.    [provider]  Fluticasone-Salmeterol (ADVAIR DISKUS) 500-50 MCG/DOSE AEPB Inhale 1 puff into the lungs 2 (two) times daily.    [provider]  furosemide (LASIX) 40 MG tablet Take 1 tablet (40 mg total) by mouth 2 (two) times daily. 12/10/17   Strader, Lennart Pall, PA-C  HUMALOG MIX 50/50 KWIKPEN (50-50) 100 UNIT/ML Kwikpen INJECT 30 UNITS INTO THE SKIN TWICE DAILY. 01/28/16   Roma Kayser, MD  losartan (COZAAR) 100 MG tablet Take 1 tablet (100 mg total) by mouth daily. 12/10/17 03/10/18  Ellsworth Lennox, PA-C  metoprolol succinate (TOPROL-XL) 100 MG 24 hr tablet Take 1 tablet (100  mg total) by mouth daily. Take with or immediately following a meal. 12/10/17 03/10/18  Strader, Lennart Pall, PA-C  Multiple Vitamins-Minerals (MULTIVITAMIN WOMEN 50+ PO) Take 1 tablet by mouth daily.    [provider]  pimecrolimus (ELIDEL) 1 % cream Apply 1 application topically 2 (two) times daily.     [provider]  promethazine (PHENERGAN) 25 MG tablet  Take 25 mg by mouth as needed. nausea     [provider]  simvastatin (ZOCOR) 20 MG tablet TAKE ONE TABLET BY MOUTH AT BEDTIME. 11/24/17   Marinus Maw, MD  spironolactone (ALDACTONE) 25 MG tablet TAKE 1 TABLET BY MOUTH ONCE DAILY. 10/12/17   Marinus Maw, MD  ULTICARE SHORT PEN NEEDLES 31G X 8 MM MISC USE AS DIRECTED 12/30/15   Roma Kayser, MD  venlafaxine XR (EFFEXOR-XR) 150 MG 24 hr capsule Take 150 mg by mouth daily.     [provider]    Family History Family History  Problem Relation Age of Onset  . Hypertension Sister   . Hypertension Brother   . Hypertension Daughter     Social History Social History   Tobacco Use  . Smoking status: Former Smoker    Packs/day: 1.00    Years: 26.00    Pack years: 26.00    Types: Cigarettes    Start date: 07/01/1979    Last attempt to quit: 10/03/2005    Years since quitting: 12.2  . Smokeless tobacco: Never Used  Substance Use Topics  . Alcohol use: Not Currently    Alcohol/week: 0.0 standard drinks    Comment: quit 2007  . Drug use: No     Allergies   Aspirin; Penicillins; and Shellfish allergy   Review of Systems Review of Systems  All other systems reviewed and are negative.    Physical Exam Updated Vital Signs BP 111/71 (BP Location: Left Arm)   Pulse 66   Temp 97.7 F (36.5 C) (Axillary)   Resp 20   Ht 6' (1.829 m)   Wt 126.6 kg   LMP 01/30/2011   SpO2 100%   BMI 37.84 kg/m   Vital signs normal    Physical Exam Vitals signs and nursing note reviewed.  Constitutional:      General: She is not in acute  distress.    Appearance: Normal appearance. She is well-developed. She is not ill-appearing or toxic-appearing.  HENT:     Head: Normocephalic and atraumatic.     Right Ear: External ear normal.     Left Ear: External ear normal.     Nose: Nose normal. No mucosal edema or rhinorrhea.     Mouth/Throat:     Dentition: No dental abscesses.     Pharynx: No uvula swelling.  Eyes:     Conjunctiva/sclera: Conjunctivae normal.     Pupils: Pupils are equal, round, and reactive to light.  Neck:     Musculoskeletal: Full passive range of motion without pain, normal range of motion and neck supple.  Cardiovascular:     Rate and Rhythm: Normal rate and regular rhythm.     Heart sounds: Normal heart sounds. No murmur. No friction rub. No gallop.   Pulmonary:     Effort: Pulmonary effort is normal. No respiratory distress.     Breath sounds: Normal breath sounds. No wheezing, rhonchi or rales.  Chest:     Chest wall: No tenderness or crepitus.  Abdominal:     General: Bowel sounds are normal. There is no distension.     Palpations: Abdomen is soft.     Tenderness: There is no abdominal tenderness. There is no guarding or rebound.     Comments: Patient's abdomen feels firmer in her lower abdomen without edema.  Musculoskeletal: Normal range of motion.        General: No tenderness.     Right lower leg: Edema present.  Left lower leg: Edema present.     Comments: Moves all extremities well.  Patient has pitting edema up to her knees bilaterally.  Skin:    General: Skin is warm and dry.     Coloration: Skin is not pale.     Findings: No erythema or rash.  Neurological:     Mental Status: She is alert and oriented to person, place, and time.     Cranial Nerves: No cranial nerve deficit.  Psychiatric:        Mood and Affect: Mood is not anxious.        Speech: Speech normal.        Behavior: Behavior normal.      ED Treatments / Results  Labs (all labs ordered are listed, but only  abnormal results are displayed) Results for orders placed or performed during the hospital encounter of 01/12/18  Comprehensive metabolic panel  Result Value Ref Range   Sodium 137 135 - 145 mmol/L   Potassium 5.2 (H) 3.5 - 5.1 mmol/L   Chloride 108 98 - 111 mmol/L   CO2 21 (L) 22 - 32 mmol/L   Glucose, Bld 148 (H) 70 - 99 mg/dL   BUN 22 (H) 6 - 20 mg/dL   Creatinine, Ser 3.09 (H) 0.44 - 1.00 mg/dL   Calcium 8.2 (L) 8.9 - 10.3 mg/dL   Total Protein 7.0 6.5 - 8.1 g/dL   Albumin 2.9 (L) 3.5 - 5.0 g/dL   AST 52 (H) 15 - 41 U/L   ALT 38 0 - 44 U/L   Alkaline Phosphatase 127 (H) 38 - 126 U/L   Total Bilirubin 3.4 (H) 0.3 - 1.2 mg/dL   GFR calc non Af Amer 34 (L) >60 mL/min   GFR calc Af Amer 40 (L) >60 mL/min   Anion gap 8 5 - 15  Brain natriuretic peptide  Result Value Ref Range   B Natriuretic Peptide 2,922.0 (H) 0.0 - 100.0 pg/mL  Troponin I - Once  Result Value Ref Range   Troponin I <0.03 <0.03 ng/mL  CBC with Differential  Result Value Ref Range   WBC 5.4 4.0 - 10.5 K/uL   RBC 4.39 3.87 - 5.11 MIL/uL   Hemoglobin 12.9 12.0 - 15.0 g/dL   HCT 40.7 68.0 - 88.1 %   MCV 91.8 80.0 - 100.0 fL   MCH 29.4 26.0 - 34.0 pg   MCHC 32.0 30.0 - 36.0 g/dL   RDW 10.3 (H) 15.9 - 45.8 %   Platelets 240 150 - 400 K/uL   nRBC 0.7 (H) 0.0 - 0.2 %   Neutrophils Relative % 47 %   Neutro Abs 2.6 1.7 - 7.7 K/uL   Lymphocytes Relative 40 %   Lymphs Abs 2.2 0.7 - 4.0 K/uL   Monocytes Relative 10 %   Monocytes Absolute 0.5 0.1 - 1.0 K/uL   Eosinophils Relative 2 %   Eosinophils Absolute 0.1 0.0 - 0.5 K/uL   Basophils Relative 1 %   Basophils Absolute 0.0 0.0 - 0.1 K/uL   Immature Granulocytes 0 %   Abs Immature Granulocytes 0.02 0.00 - 0.07 K/uL   Laboratory interpretation all normal except hyperglycemia, renal insufficiency, very elevated BNP    EKG EKG Interpretation  Date/Time:  Wednesday January 12 2018 03:37:48 EST Ventricular Rate:  71 PR Interval:    QRS Duration: 152 QT  Interval:  495 QTC Calculation: 538 R Axis:   -75 Text Interpretation:  Sinus rhythm Nonspecific IVCD with LAD Left  ventricular hypertrophy Anterior Q waves, possibly due to LVH No significant change since last tracing 17 Jan 2017 Confirmed by Devoria AlbeKnapp, Ayanah Snader ([id number]) on 01/12/2018 3:41:33 AM   Radiology Dg Chest 2 View  Result Date: 01/12/2018 CLINICAL DATA:  Acute onset of shortness of breath and lower leg pain and swelling. EXAM: CHEST - 2 VIEW COMPARISON:  Chest radiograph performed 02/14/2011 FINDINGS: The lungs are hypoexpanded. Vascular congestion is seen. Hazy bilateral airspace opacities raise concern for pulmonary edema. A small left pleural effusion is noted. No pneumothorax is seen. The cardiomediastinal silhouette is mildly enlarged. A pacemaker/AICD is noted at the left chest wall, with leads ending at the right atrium and right ventricle. No acute osseous abnormalities are identified. IMPRESSION: Lungs hypoexpanded. Vascular congestion and mild cardiomegaly. Hazy bilateral airspace opacities raise concern for pulmonary edema. Small left pleural effusion noted. Electronically Signed   By: Roanna RaiderJeffery  Chang M.D.   On: 01/12/2018 04:59    Procedures .Critical Care Performed by: Devoria AlbeKnapp, Davey Limas, MD Authorized by: Devoria AlbeKnapp, Malena Timpone, MD   Critical care provider statement:    Critical care time (minutes):  33   Critical care was necessary to treat or prevent imminent or life-threatening deterioration of the following conditions:  Cardiac failure   Critical care was time spent personally by me on the following activities:  Discussions with consultants, examination of patient, obtaining history from patient or surrogate, ordering and review of laboratory studies, ordering and review of radiographic studies, pulse oximetry, re-evaluation of patient's condition and review of old charts   (including critical care time)  Medications Ordered in ED Medications  furosemide (LASIX) injection 60 mg (60 mg  Intravenous Given 01/12/18 0414)  nitroGLYCERIN (NITROGLYN) 2 % ointment 0.5 inch (0.5 inches Topical Given 01/12/18 0411)     Initial Impression / Assessment and Plan / ED Course  I have reviewed the triage vital signs and the nursing notes.  Pertinent labs & imaging results that were available during my care of the patient were reviewed by me and considered in my medical decision making (see chart for details).     Patient presents with 3 days of peripheral edema in setting of congestive heart failure history.  Chest x-ray was done, laboratory testing was done, she was given IV Lasix and had 1/2 inch of Nitropaste applied because her blood pressure was only 115 systolic.  When I review her last cardiology visit they describe an echo done in 2012 which showed her ejection fraction was 20 to 25%.   Recheck 5:56 AM she has had 450 cc urinary output.  We discussed her test results.  Her last BMP was in 2015 and was in the 400 range.  We discussed ambulating and checking her pulse ox which I ordered however she told the nurse she has not been able to walk for 3 weeks because of her legs hurting from her lupus.  I will talk to the hospitalist about admission.   6:42 AM Dr. Loney Lohathore, hospitalist will admit  Final Clinical Impressions(s) / ED Diagnoses   Final diagnoses:  Acute on chronic combined systolic and diastolic congestive heart failure [facility](HCC)    Plan admission  Devoria AlbeIva Kaitlinn Iversen, MD, Concha PyoFACEP    Ademide Schaberg, MD 01/12/18 [phone]

## 2018-01-12 NOTE — ED Triage Notes (Signed)
Pt reports sob worsening over the past several days.  Pt also c/o pain and swelling to her lower legs.

## 2018-01-13 ENCOUNTER — Inpatient Hospital Stay (HOSPITAL_COMMUNITY): Payer: Medicaid Other

## 2018-01-13 DIAGNOSIS — N179 Acute kidney failure, unspecified: Secondary | ICD-10-CM

## 2018-01-13 DIAGNOSIS — I361 Nonrheumatic tricuspid (valve) insufficiency: Secondary | ICD-10-CM

## 2018-01-13 LAB — BASIC METABOLIC PANEL
Anion gap: 14 (ref 5–15)
BUN: 31 mg/dL — AB (ref 6–20)
CO2: 19 mmol/L — ABNORMAL LOW (ref 22–32)
Calcium: 8.8 mg/dL — ABNORMAL LOW (ref 8.9–10.3)
Chloride: 108 mmol/L (ref 98–111)
Creatinine, Ser: 2.24 mg/dL — ABNORMAL HIGH (ref 0.44–1.00)
GFR calc Af Amer: 27 mL/min — ABNORMAL LOW (ref >60)
GFR calc non Af Amer: 24 mL/min — ABNORMAL LOW (ref >60)
Glucose, Bld: 110 mg/dL — ABNORMAL HIGH (ref 70–99)
Potassium: 4.7 mmol/L (ref 3.5–5.1)
Sodium: 141 mmol/L (ref 135–145)

## 2018-01-13 LAB — GLUCOSE, CAPILLARY
GLUCOSE-CAPILLARY: 24 mg/dL — AB (ref 70–99)
GLUCOSE-CAPILLARY: 119 mg/dL — AB (ref 70–99)
Glucose-Capillary: 41 mg/dL — CL (ref 70–99)
Glucose-Capillary: 73 mg/dL (ref 70–99)
Glucose-Capillary: 146 mg/dL — ABNORMAL HIGH (ref 70–99)
Glucose-Capillary: 148 mg/dL — ABNORMAL HIGH (ref 70–99)

## 2018-01-13 LAB — ECHOCARDIOGRAM COMPLETE
Height: 72 in
Weight: 4850.12 oz

## 2018-01-13 MED ORDER — DEXTROSE 50 % IV SOLN
INTRAVENOUS | Status: AC
  Administered 2018-01-13: 50 mL via INTRAVENOUS
  Filled 2018-01-13: qty 50

## 2018-01-13 MED ORDER — GLUCOSE 40 % PO GEL
ORAL | Status: AC
  Filled 2018-01-13: qty 1

## 2018-01-13 MED ORDER — DEXTROSE 50 % IV SOLN
50.0000 mL | Freq: Once | INTRAVENOUS | Status: AC
  Administered 2018-01-13: 50 mL via INTRAVENOUS

## 2018-01-13 NOTE — Progress Notes (Signed)
Patient Katherine [name] now 73. Patient is alert and oriented and states she feels " a little better" will continue to monitor patient throughout the shift.

## 2018-01-13 NOTE — Progress Notes (Signed)
Patient CBG 24. Dextrose 50% solution 50 ml administered to patient. Will continue to monitor patient CBG. All insulin administration held at this time.

## 2018-01-13 NOTE — Evaluation (Signed)
Physical Therapy Evaluation Patient Details Name: Katherine [name] MRN: [id number] DOB: 1960-11-25 Today's Date: 01/13/2018   History of Present Illness   Katherine [name] is a 57 y.o. female with medical history significant of chronic systolic congestive heart failure ejection fraction of 30 to 35% on echo from 2012, lupus, chronic kidney disease stage III, hypertension diabetes, presents to the hospital with complaints of increasing shortness of breath.  Patient reports that she has been short of breath since October of this year.  Her symptoms of gotten worse over the past week.  She is noticed worsening lower extremity edema.  She has 4 pillow orthopnea.  She denies any chest discomfort.  She does have cough and has been feeling feverish lately.  She did not have any dysuria.  She has had some nausea and vomiting.  No diarrhea.  She has been feeling increasingly weak.  She has difficulty walking due to shortness of breath as well as heaviness in her legs.  Clinical Impression  Ms. Mcgurl has been having deteriorating functional ability for several months.  At this time she needs someone to  Assist her to get up and down her steps at home.  She is at high risk of falling and will benefit from SNF or 24 hr supervision until she is stronger.     Follow Up Recommendations SNF;Supervision/Assistance - 24 hour    Equipment Recommendations   bed side commode     Recommendations for Other Services       Precautions / Restrictions        Mobility  Bed Mobility Overal bed mobility: Needs Assistance Bed Mobility: Supine to Sit;Sit to Supine     Supine to sit: Min assist Sit to supine: Mod assist   General bed mobility comments: PT able to scoot to [facility] while sitting I   Transfers Overall transfer level: Needs assistance(unable to safely at this time )                  Ambulation/Gait              Balance    sitting balance good  Standing unknown                                           Pertinent Vitals/Pain Pain Assessment: 0-10 Pain Score: 6  Pain Location: B LE  Pain Descriptors / Indicators: Heaviness;Aching Pain Intervention(s): Limited activity within patient's tolerance    Home Living Family/patient expects to be discharged to:: Private residence Living Arrangements: Alone Available Help at Discharge: Friend(s) Type of Home: House Home Access: Stairs to enter Entrance Stairs-Rails: Right Entrance Stairs-Number of Steps: 6 Home Layout: One level Home Equipment: Walker - 2 wheels;Cane - quad      Prior Function Level of Independence: Needs assistance         Comments: A friend assists with going up and down steps      Hand Dominance        Extremity/Trunk Assessment        Lower Extremity Assessment Lower Extremity Assessment: Generalized weakness       Communication   Communication: No difficulties  Cognition Arousal/Alertness: Awake/alert Behavior During Therapy: WFL for tasks assessed/performed Overall Cognitive Status: Within Functional Limits for tasks assessed  General Comments      Exercises General Exercises - Lower Extremity Ankle Circles/Pumps: Both;10 reps Quad Sets: Both;10 reps Gluteal Sets: Both;10 reps Heel Slides: Both;5 reps Hip ABduction/ADduction: Both;5 reps   Assessment/Plan    PT Assessment Patient needs continued PT services  PT Problem List Decreased strength;Decreased activity tolerance;Decreased balance;Decreased mobility;Obesity;Pain       PT Treatment Interventions Therapeutic activities;Stair training;Gait training;Therapeutic exercise    PT Goals (Current goals can be found in the Care Plan section)       Frequency Min 5X/week   Barriers to discharge        Co-evaluation               AM-PAC PT "6 Clicks" Mobility  Outcome Measure                  End of Session  Equipment Utilized During Treatment: Gait belt Activity Tolerance: Patient limited by fatigue;Patient limited by pain Patient left: in bed Nurse Communication: Mobility status PT Visit Diagnosis: Unsteadiness on feet (R26.81);Other abnormalities of gait and mobility (R26.89);Muscle weakness (generalized) (M62.81);History of falling (Z91.81);Difficulty in walking, not elsewhere classified (R26.2)    Time: [id number] PT Time Calculation (min) (ACUTE ONLY): 37 min   Charges:   PT Evaluation $PT Eval Moderate Complexity: 1 Mod PT Treatments $Therapeutic Activity: 8-22 mins         Virgina OrganCynthia Leopold Smyers, PT CLT [phone] 01/13/2018, 3:23 PM

## 2018-01-13 NOTE — Progress Notes (Signed)
PROGRESS NOTE    Katherine [name]  ZOX:[id number] DOB: 13-Oct-1960 DOA: 01/12/2018 PCP: Alvina FilbertHunter, Denise, MD    Brief Narrative:  57 year old female with a history of cardiomyopathy, low ejection fraction, lupus, chronic kidney disease stage III, hypertension and diabetes who presented to the hospital with worsening shortness of breath.  She was found to be in decompensated CHF and admitted for IV diuresis.   Assessment & Plan:   Active Problems:   Non-ischemic cardiomyopathy (HCC)   AICD (automatic cardioverter/defibrillator) present   Diabetes mellitus with stage 3 chronic kidney disease (HCC)   Dyslipidemia   Lupus (HCC)   Acute on chronic combined systolic and diastolic CHF (congestive heart failure) (HCC)   Morbid obesity due to excess calories (HCC)   1. Acute on chronic combined CHF.  Echocardiogram performed today shows ejection fraction of 20 to 25%.  She is currently on IV Lasix.  Urine output has been unimpressive.  Creatinine is trending up.  Unclear if she will need inotropes to achieve further diuresis.  Will consult cardiology in a.m.  Losartan currently on hold due to renal dysfunction.  Continue on beta-blockers and spironolactone. 2. Lupus.  Continue on Plaquenil.  No evidence of flares at this time. 3. Chronic kidney disease stage III.  Creatinine has trended up with diuresis.  Will hold off on further Lasix.  Continue to monitor. 4. Diabetes.  Patient had episode of severe hypoglycemia this morning.  We will hold her NPH insulin for now.  She reports that she often does not take her insulin since her blood sugar runs on the lower side at home.  Continue to monitor on sliding scale insulin. 5. Hyperlipidemia.  Continue statin 6. Hypertension.  Clonidine and losartan currently on hold.  Continue on beta-blockers. 7. AICD.  History of ventricular tachycardia.  Was recently started on amiodarone by her electrophysiologist, but stopped taking this 1 week ago due to GI  upset.   DVT prophylaxis: Heparin Code Status: Full code Family Communication: No family present Disposition Plan: Skilled nursing facility placement on discharge   Consultants:     Procedures:  Echo:- Left ventricle: The cavity size was moderately dilated. Wall   thickness was increased in a pattern of mild LVH. Systolic   function was severely reduced. The estimated ejection fraction   was in the range of 20% to 25%. Diffuse hypokinesis. Features are   consistent with a pseudonormal left ventricular filling pattern,   with concomitant abnormal relaxation and increased filling   pressure (grade 2 diastolic dysfunction). - Ventricular septum: Septal motion showed abnormal function and   dyssynergy. - Aortic valve: Mildly calcified annulus. Trileaflet; mildly   calcified leaflets. There was mild regurgitation. - Mitral valve: Mildly calcified annulus. There was mild   regurgitation. - Left atrium: The atrium was mildly dilated. - Right ventricle: The cavity size was mildly dilated. Pacer wire   or catheter noted in right ventricle. Systolic function was   mildly reduced. - Right atrium: The atrium was mildly dilated. - Tricuspid valve: There was mild regurgitation. - Pulmonary arteries: Systolic pressure was moderately increased.   PA peak pressure: 46 mm Hg (S). - Pericardium, extracardiac: A trivial pericardial effusion was    identified.  Antimicrobials:       Subjective: Patient had an episode of nausea this morning.  She had severe hypoglycemia with a blood sugar in the 20s.  Overall feels that her breathing is improving, but still not back to baseline.  Objective: Vitals:  01/13/18 0735 01/13/18 0758 01/13/18 1120 01/13/18 1431  BP: 121/82  (!) 137/123 (!) 140/95  Pulse: 66  100 70  Resp: 18   18  Temp: 97.7 F (36.5 C)   97.6 F (36.4 C)  TempSrc: Oral   Oral  SpO2: 99% 99%  96%  Weight:      Height:        Intake/Output Summary (Last 24 hours) at  01/13/2018 2016 Last data filed at 01/13/2018 1700 Gross per 24 hour  Intake 480 ml  Output 750 ml  Net -270 ml   Filed Weights   01/12/18 0334 01/13/18 0500 01/13/18 0624  Weight: 126.6 kg (!) 140.4 kg (!) 137.5 kg    Examination:  General exam: Appears calm and comfortable  Respiratory system: Clear to auscultation. Respiratory effort normal. Cardiovascular system: S1 & S2 heard, RRR. No JVD, murmurs, rubs, gallops or clicks. 1+ pedal edema. Gastrointestinal system: Abdomen is nondistended, soft and nontender. No organomegaly or masses felt. Normal bowel sounds heard. Central nervous system: Alert and oriented. No focal neurological deficits. Extremities: Symmetric 5 x 5 power. Skin: No rashes, lesions or ulcers Psychiatry: Judgement and insight appear normal. Mood & affect appropriate.     Data Reviewed: I have personally reviewed following labs and imaging studies  CBC: Recent Labs  Lab 01/12/18 0410  WBC 5.4  NEUTROABS 2.6  HGB 12.9  HCT 40.3  MCV 91.8  PLT 240   Basic Metabolic Panel: Recent Labs  Lab 01/12/18 0410 01/13/18 0942  NA 137 141  K 5.2* 4.7  CL 108 108  CO2 21* 19*  GLUCOSE 148* 110*  BUN 22* 31*  CREATININE 1.65* 2.24*  CALCIUM 8.2* 8.8*   GFR: Estimated Creatinine Clearance: 43.3 mL/min (A) (by C-G formula based on SCr of 2.24 mg/dL (H)). Liver Function Tests: Recent Labs  Lab 01/12/18 0410  AST 52*  ALT 38  ALKPHOS 127*  BILITOT 3.4*  PROT 7.0  ALBUMIN 2.9*   No results for input(s): LIPASE, AMYLASE in the last 168 hours. No results for input(s): AMMONIA in the last 168 hours. Coagulation Profile: No results for input(s): INR, PROTIME in the last 168 hours. Cardiac Enzymes: Recent Labs  Lab 01/12/18 0410  TROPONINI <0.03   BNP (last 3 results) No results for input(s): PROBNP in the last 8760 hours. HbA1C: No results for input(s): HGBA1C in the last 72 hours. CBG: Recent Labs  Lab 01/13/18 0728 01/13/18 0821  01/13/18 0836 01/13/18 1130 01/13/18 1625  GLUCAP 24* 41* 73 146* 119*   Lipid Profile: No results for input(s): CHOL, HDL, LDLCALC, TRIG, CHOLHDL, LDLDIRECT in the last 72 hours. Thyroid Function Tests: No results for input(s): TSH, T4TOTAL, FREET4, T3FREE, THYROIDAB in the last 72 hours. Anemia Panel: No results for input(s): VITAMINB12, FOLATE, FERRITIN, TIBC, IRON, RETICCTPCT in the last 72 hours. Sepsis Labs: No results for input(s): PROCALCITON, LATICACIDVEN in the last 168 hours.  No results found for this or any previous visit (from the past 240 hour(s)).       Radiology Studies: Dg Chest 2 View  Result Date: 01/12/2018 CLINICAL DATA:  Acute onset of shortness of breath and lower leg pain and swelling. EXAM: CHEST - 2 VIEW COMPARISON:  Chest radiograph performed 02/14/2011 FINDINGS: The lungs are hypoexpanded. Vascular congestion is seen. Hazy bilateral airspace opacities raise concern for pulmonary edema. A small left pleural effusion is noted. No pneumothorax is seen. The cardiomediastinal silhouette is mildly enlarged. A pacemaker/AICD is noted at the left  chest wall, with leads ending at the right atrium and right ventricle. No acute osseous abnormalities are identified. IMPRESSION: Lungs hypoexpanded. Vascular congestion and mild cardiomegaly. Hazy bilateral airspace opacities raise concern for pulmonary edema. Small left pleural effusion noted. Electronically Signed   By: Roanna Raider M.D.   On: 01/12/2018 04:59        Scheduled Meds: . clopidogrel  75 mg Oral Daily  . furosemide  40 mg Intravenous BID  . heparin  5,000 Units Subcutaneous Q8H  . insulin aspart  0-15 Units Subcutaneous TID WC  . insulin aspart  0-5 Units Subcutaneous QHS  . loratadine  10 mg Oral Daily  . metoprolol succinate  100 mg Oral Daily  . mometasone-formoterol  2 puff Inhalation BID  . simvastatin  20 mg Oral QHS  . sodium chloride flush  3 mL Intravenous Q12H  . spironolactone  25  mg Oral Daily  . venlafaxine XR  150 mg Oral Daily   Continuous Infusions: . sodium chloride       LOS: 1 day    Time spent:    Erick Blinks, MD [facility] Pager [phone]  If 7PM-7AM, please contact night-coverage [email or web address] Password [email or web address] 01/13/2018, 8:16 PM

## 2018-01-13 NOTE — Progress Notes (Signed)
*  PRELIMINARY RESULTS* Echocardiogram 2D Echocardiogram has been performed.  Katherine [name] 01/13/2018, 9:27 AM

## 2018-01-13 NOTE — Clinical Social Work Note (Signed)
Clinical Social Work Assessment  Patient Details  Name: Katherine [name] MRN: [id number] Date of Birth: January 12, 1961  Date of referral:  01/13/18               Reason for consult:  Emotional/Coping/Adjustment to Illness, Transportation, Discharge Planning                Permission sought to share information with:    Permission granted to share information::     Name::        Agency::     Relationship::     Contact Information:     Housing/Transportation Living arrangements for the past 2 months:  Single Family Home Source of Information:  Patient Patient Interpreter Needed:  None Criminal Activity/Legal Involvement Pertinent to Current Situation/Hospitalization:  No - Comment as needed Significant Relationships:  Other Family Members, Friend Lives with:  Self Do you feel safe going back to the place where you live?  Yes Need for family participation in patient care:  Yes (Comment)  Care giving concerns:  Pt states that she has been spending her days in bed as she has been feeling much worse medically for the past month. Cites stressors of grandson who was shot and killed in [place] in August, and a son Caryl Bis of the grandson] who died in 2022-03-22.  States she has been increasingly depressed, and appetite has been minimal over past month.  Pateint is estranged from 2 daughters as she had her son cremated, per his wishes, and daughters did not.  States her main supports are a Network engineer and the uncle of her ex son-in-law who continues to support and care about her despite no longer being together with daughter.  She has help with meals from the uncle, as well as helping her to and from bathroom during the day.  States she ambulates with use of walker.  Also has a shower chair, but would like bedside commode, wheelchair for leaving the home, and some in-home help when she is alone.   Social Worker assessment / plan:  Refer to home health.  Assess for requested DMEs, as well as in-home help  through [facility] provider.  Employment status:  Disabled (Comment on whether or not currently receiving Disability) Insurance information:  Medicaid In [place] PT Recommendations:  Home with Home Health Information / Referral to community resources:     Patient/Family's Response to care:  Open, appreciative.  Patient/Family's Understanding of and Emotional Response to Diagnosis, Current Treatment, and Prognosis:  Pt is realistic, yet optimistic that things will get better with time.  Emotional Assessment Appearance:  Appears stated age Attitude/Demeanor/Rapport:  Grieving, Engaged Affect (typically observed):  Depressed, Hopeful Orientation:  Oriented to Self, Oriented to Place, Oriented to  Time, Oriented to Situation Alcohol / Substance use:  Not Applicable Psych involvement (Current and /or in the community):  No (Comment)  Discharge Needs  Concerns to be addressed:  Discharge Planning Concerns, Home Safety Concerns, Lack of Support Readmission within the last 30 days:    Current discharge risk:  Lives alone, Dependent with Mobility Barriers to Discharge:  Barriers Resolved   Ida Rogue, LCSW 01/13/2018, 1:33 PM

## 2018-01-13 NOTE — Progress Notes (Signed)
Patient CBG 24. Patient currently symptomatic and complains of nausea and "feeling unwell" MD has been notified and received verbal order to administer Dextrose 50% 23ml per protocol. Will continue to monitor patient.

## 2018-01-13 NOTE — Progress Notes (Signed)
Patient complains of "feeling unwell" vital signs are as follows.     01/13/18 0735  Vitals  Temp 97.7 F (36.5 C)  Temp Source Oral  BP 121/82  BP Location Right Arm  BP Method Automatic  Patient Position (if appropriate) Lying  Pulse Rate 66  Pulse Rate Source Dinamap  Resp 18  Oxygen Therapy  SpO2 99 %  O2 Device Room Air

## 2018-01-13 NOTE — Progress Notes (Signed)
Patient CBG 41. Patient offered regular orange juice. Will recheck CBGs.

## 2018-01-14 ENCOUNTER — Encounter (HOSPITAL_COMMUNITY): Payer: Self-pay | Admitting: Cardiology

## 2018-01-14 DIAGNOSIS — I428 Other cardiomyopathies: Secondary | ICD-10-CM

## 2018-01-14 DIAGNOSIS — Z95810 Presence of automatic (implantable) cardiac defibrillator: Secondary | ICD-10-CM

## 2018-01-14 DIAGNOSIS — I5043 Acute on chronic combined systolic (congestive) and diastolic (congestive) heart failure: Secondary | ICD-10-CM

## 2018-01-14 LAB — GLUCOSE, CAPILLARY
Glucose-Capillary: 118 mg/dL — ABNORMAL HIGH (ref 70–99)
Glucose-Capillary: 131 mg/dL — ABNORMAL HIGH (ref 70–99)
Glucose-Capillary: 160 mg/dL — ABNORMAL HIGH (ref 70–99)
Glucose-Capillary: 171 mg/dL — ABNORMAL HIGH (ref 70–99)

## 2018-01-14 LAB — BASIC METABOLIC PANEL
ANION GAP: 12 (ref 5–15)
BUN: 40 mg/dL — ABNORMAL HIGH (ref 6–20)
CO2: 22 mmol/L (ref 22–32)
Calcium: 8.8 mg/dL — ABNORMAL LOW (ref 8.9–10.3)
Chloride: 107 mmol/L (ref 98–111)
Creatinine, Ser: 2.49 mg/dL — ABNORMAL HIGH (ref 0.44–1.00)
GFR calc Af Amer: 24 mL/min — ABNORMAL LOW (ref >60)
GFR calc non Af Amer: 21 mL/min — ABNORMAL LOW (ref >60)
GLUCOSE: 153 mg/dL — AB (ref 70–99)
Potassium: 4.6 mmol/L (ref 3.5–5.1)
Sodium: 141 mmol/L (ref 135–145)

## 2018-01-14 LAB — HIV ANTIBODY (ROUTINE TESTING W REFLEX): HIV Screen 4th Generation wRfx: NONREACTIVE

## 2018-01-14 MED ORDER — ALUM & MAG HYDROXIDE-SIMETH 200-200-20 MG/5ML PO SUSP
30.0000 mL | Freq: Four times a day (QID) | ORAL | Status: DC | PRN
  Administered 2018-01-14: 30 mL via ORAL
  Filled 2018-01-14: qty 30

## 2018-01-14 MED ORDER — POLYETHYLENE GLYCOL 3350 17 G PO PACK
17.0000 g | PACK | Freq: Every day | ORAL | Status: DC
  Administered 2018-01-14 – 2018-01-24 (×7): 17 g via ORAL
  Filled 2018-01-14 (×11): qty 1

## 2018-01-14 MED ORDER — HYDRALAZINE HCL 10 MG PO TABS
20.0000 mg | ORAL_TABLET | Freq: Three times a day (TID) | ORAL | Status: DC
  Administered 2018-01-14 – 2018-01-24 (×27): 20 mg via ORAL
  Filled 2018-01-14 (×29): qty 2

## 2018-01-14 MED ORDER — METOPROLOL SUCCINATE ER 50 MG PO TB24
50.0000 mg | ORAL_TABLET | Freq: Every day | ORAL | Status: DC
  Administered 2018-01-15 – 2018-01-24 (×10): 50 mg via ORAL
  Filled 2018-01-14 (×10): qty 1

## 2018-01-14 MED ORDER — SODIUM CHLORIDE 0.9% FLUSH
10.0000 mL | Freq: Two times a day (BID) | INTRAVENOUS | Status: DC
  Administered 2018-01-14 – 2018-01-18 (×7): 10 mL
  Administered 2018-01-18: 20 mL
  Administered 2018-01-19 – 2018-01-24 (×9): 10 mL

## 2018-01-14 MED ORDER — MAGNESIUM HYDROXIDE 400 MG/5ML PO SUSP
15.0000 mL | Freq: Every day | ORAL | Status: DC | PRN
  Administered 2018-01-14: 15 mL via ORAL
  Filled 2018-01-14: qty 30

## 2018-01-14 MED ORDER — ISOSORBIDE MONONITRATE ER 60 MG PO TB24
30.0000 mg | ORAL_TABLET | Freq: Every day | ORAL | Status: DC
  Administered 2018-01-14 – 2018-01-24 (×11): 30 mg via ORAL
  Filled 2018-01-14 (×11): qty 1

## 2018-01-14 MED ORDER — FUROSEMIDE 10 MG/ML IJ SOLN
8.0000 mg/h | INTRAVENOUS | Status: DC
  Administered 2018-01-14 – 2018-01-20 (×3): 4 mg/h via INTRAVENOUS
  Administered 2018-01-21 – 2018-01-23 (×2): 8 mg/h via INTRAVENOUS
  Filled 2018-01-14 (×6): qty 25

## 2018-01-14 MED ORDER — SODIUM CHLORIDE 0.9% FLUSH: 10.0000 mL | INTRAVENOUS | Status: DC | PRN

## 2018-01-14 NOTE — Consult Note (Signed)
Cardiology Consultation:   Patient ID: ZAMYAH WIESMAN; [id number]; Oct 28, 1960   Admit date: 01/12/2018 Date of Consult: 01/14/2018  Primary Care Provider: Alvina Filbert, MD Primary Cardiologist: Dr. Prentice Docker (last seen 2015) Primary Electrophysiologist:  Dr. Lewayne Bunting   Patient Profile:   Katherine [name] is a 57 y.o. female with a history of nonischemic cardiomyopathy with Medtronic ICD in place, hypertension, previous stroke, type 2 diabetes mellitus, CKD stage 3 who is being seen today for the evaluation of congestive heart failure at the request of Dr. Kerry Hough.  History of Present Illness:   Katherine [name] is currently admitted to the hospital with acute on chronic shortness of breath over the last few weeks.  She states that she has been taking her regular medications and denies any recent increasing fluid or sodium intake.  She was most recently seen in our office by Ms. Strader PA-C in November at which time she was switched from Coreg back to Toprol-XL and from lisinopril to losartan as the patient was reporting intolerances to these medications.  She had also been on amiodarone for suppression of VT (per [name]), however decided to stop this medication since it was making her nauseated.  Weight was 291 pounds at office encounter, now 310 pounds.  So far patient has been continued on Toprol-XL and Aldactone, ARB was discontinued and Lasix held with bump in creatinine from 1.6-2.2.  She remains fluid overloaded.  Echocardiogram performed yesterday revealed LVEF 20 to 25% range with moderate diastolic dysfunction, mildly reduced RV contraction, mild mitral and tricuspid regurgitation, and PASP 46 mmHg.  Past Medical History:  Diagnosis Date  . Anxiety   . Arthritis   . Asthma   . Bronchitis   . CHF (congestive heart failure) (HCC)    a. EF 20-25% by cath in 11/2010 with cath showing normal cors  . CKD (chronic kidney disease) stage 3, GFR 30-59 ml/min (HCC)     . Fibromyalgia   . GERD (gastroesophageal reflux disease)   . Hallux valgus of left foot   . Hay fever   . Headache(784.0)   . History of pneumonia   . History of seizures   . Hyperlipidemia   . Hypertension   . Nonischemic cardiomyopathy (HCC)   . Obesity   . Stroke (HCC)   . Systemic lupus erythematosus (HCC)   . Type 2 diabetes mellitus (HCC)     Past Surgical History:  Procedure Laterality Date  . ICD  02/13/2011   Medtronic Protecta DR XT  . ICD GENERATOR CHANGEOUT N/A 01/07/2017   Procedure: ICD GENERATOR CHANGEOUT;  Surgeon: Marinus Maw, MD;  Location[facility] INVASIVE CV LAB;  Service: Cardiovascular;  Laterality: N/A;  . IMPLANTABLE CARDIOVERTER DEFIBRILLATOR IMPLANT N/A 02/13/2011   Procedure: IMPLANTABLE CARDIOVERTER DEFIBRILLATOR IMPLANT;  Surgeon: Thurmon Fair, MD;  Location: [facility] CATH LAB;  Service: Cardiovascular;  Laterality: N/A;  . LEFT HEART CATHETERIZATION WITH CORONARY ANGIOGRAM N/A 12/05/2010   Procedure: LEFT HEART CATHETERIZATION WITH CORONARY ANGIOGRAM;  Surgeon: Chrystie Nose, MD;  Location: [facility] CATH LAB;  Service: Cardiovascular;  Laterality: N/A;  . RIGHT ANKLE    . TUBAL LIGATION    . US ECHOCARDIOGRAPHY  11/09/2010   mild LV enlargement w/conc. LVH & severe global hypokinesis EF 30-35%,mod. diastolic dysfunction, mod. TR,mild AI,mild to mod MR,mild PI     Inpatient Medications: Scheduled Meds: . clopidogrel  75 mg Oral Daily  . heparin  5,000 Units Subcutaneous Q8H  . insulin aspart  0-15  Units Subcutaneous TID WC  . insulin aspart  0-5 Units Subcutaneous QHS  . loratadine  10 mg Oral Daily  . metoprolol succinate  100 mg Oral Daily  . mometasone-formoterol  2 puff Inhalation BID  . simvastatin  20 mg Oral QHS  . sodium chloride flush  3 mL Intravenous Q12H  . spironolactone  25 mg Oral Daily  . venlafaxine XR  150 mg Oral Daily   Continuous Infusions: . sodium chloride     PRN Meds: sodium chloride, acetaminophen, alum & mag  hydroxide-simeth, magnesium hydroxide, promethazine, sodium chloride flush  Allergies:    Allergies  Allergen Reactions  . Aspirin Other (See Comments)  . Penicillins Itching    Can take amoxicillin without a reaction  . Shellfish Allergy Other (See Comments)    Causes to have grand mal seizures    Social History:   Social History   Socioeconomic History  . Marital status: Legally Separated    Spouse name: Not on file  . Number of children: Not on file  . Years of education: Not on file  . Highest education level: Not on file  Occupational History  . Not on file  Social Needs  . Financial resource strain: Not on file  . Food insecurity:    Worry: Not on file    Inability: Not on file  . Transportation needs:    Medical: Not on file    Non-medical: Not on file  Tobacco Use  . Smoking status: Former Smoker    Packs/day: 1.00    Years: 26.00    Pack years: 26.00    Types: Cigarettes    Start date: 07/01/1979    Last attempt to quit: 10/03/2005    Years since quitting: 12.2  . Smokeless tobacco: Never Used  Substance and Sexual Activity  . Alcohol use: Not Currently    Alcohol/week: 0.0 standard drinks    Comment: quit 2007  . Drug use: No  . Sexual activity: Yes  Lifestyle  . Physical activity:    Days per week: Not on file    Minutes per session: Not on file  . Stress: Not on file  Relationships  . Social connections:    Talks on phone: Not on file    Gets together: Not on file    Attends religious service: Not on file    Active member of club or organization: Not on file    Attends meetings of clubs or organizations: Not on file    Relationship status: Not on file  . Intimate partner violence:    Fear of current or ex partner: Not on file    Emotionally abused: Not on file    Physically abused: Not on file    Forced sexual activity: Not on file  Other Topics Concern  . Not on file  Social History Narrative  . Not on file    Family History:   The  patient's family history includes Hypertension in her brother, daughter, and sister.  ROS:  Please see the history of present illness.  Fatigue. All other ROS reviewed and negative.     Physical Exam/Data:   Vitals:   01/13/18 2036 01/13/18 2206 01/14/18 0629 01/14/18 0805  BP:  (!) 114/91 (!) 140/104   Pulse:  69 72   Resp:  18 19   Temp:  (!) 97.3 F (36.3 C) 98.9 F (37.2 C)   TempSrc:  Oral    SpO2: 98% 100% 100% 99%  Weight:   Marland Kitchen)  141 kg   Height:        Intake/Output Summary (Last 24 hours) at 01/14/2018 1004 Last data filed at 01/13/2018 1700 Gross per 24 hour  Intake 480 ml  Output 600 ml  Net -120 ml   Filed Weights   01/13/18 0500 01/13/18 0624 01/14/18 0629  Weight: (!) 140.4 kg (!) 137.5 kg (!) 141 kg   Body mass index is 42.16 kg/m.   Gen: Morbidly obese woman, no acute distress. HEENT: Conjunctiva and lids normal, oropharynx clear. Neck: Supple, increased girth, elevated JVP , no carotid bruits. Lungs: Decreased breath sounds left base, scattered crackles, no wheezing, nonlabored breathing at rest. Cardiac: Indistinct PMI, RRR, no S3, soft apical systolic murmur, no pericardial rub. Abdomen: Obese, nontender, bowel sounds present, no guarding or rebound. Extremities: 2+ lower leg edema, distal pulses 1-2+. Skin: Warm and dry. Musculoskeletal: No kyphosis. Neuropsychiatric: Alert and oriented x3, affect grossly appropriate.  EKG:  I personally reviewed the tracing from 01/13/2018 which showed sinus rhythm with left anterior fascicular block/IVCD.  Telemetry:  I personally reviewed telemetry which shows sinus rhythm.  Relevant CV Studies:  Echocardiogram 01/13/2018: Study Conclusions  - Left ventricle: The cavity size was moderately dilated. Wall   thickness was increased in a pattern of mild LVH. Systolic   function was severely reduced. The estimated ejection fraction   was in the range of 20% to 25%. Diffuse hypokinesis. Features are    consistent with a pseudonormal left ventricular filling pattern,   with concomitant abnormal relaxation and increased filling   pressure (grade 2 diastolic dysfunction). - Ventricular septum: Septal motion showed abnormal function and   dyssynergy. - Aortic valve: Mildly calcified annulus. Trileaflet; mildly   calcified leaflets. There was mild regurgitation. - Mitral valve: Mildly calcified annulus. There was mild   regurgitation. - Left atrium: The atrium was mildly dilated. - Right ventricle: The cavity size was mildly dilated. Pacer wire   or catheter noted in right ventricle. Systolic function was   mildly reduced. - Right atrium: The atrium was mildly dilated. - Tricuspid valve: There was mild regurgitation. - Pulmonary arteries: Systolic pressure was moderately increased.   PA peak pressure: 46 mm Hg (S). - Pericardium, extracardiac: A trivial pericardial effusion was   identified.  Laboratory Data:  Chemistry Recent Labs  Lab 01/12/18 0410 01/13/18 0942  NA 137 141  K 5.2* 4.7  CL 108 108  CO2 21* 19*  GLUCOSE 148* 110*  BUN 22* 31*  CREATININE 1.65* 2.24*  CALCIUM 8.2* 8.8*  GFRNONAA 34* 24*  GFRAA 40* 27*  ANIONGAP 8 14    Recent Labs  Lab 01/12/18 0410  PROT 7.0  ALBUMIN 2.9*  AST 52*  ALT 38  ALKPHOS 127*  BILITOT 3.4*   Hematology Recent Labs  Lab 01/12/18 0410  WBC 5.4  RBC 4.39  HGB 12.9  HCT 40.3  MCV 91.8  MCH 29.4  MCHC 32.0  RDW 22.5*  PLT 240   Cardiac Enzymes Recent Labs  Lab 01/12/18 0410  TROPONINI <0.03   No results for input(s): TROPIPOC in the last 168 hours.  BNP Recent Labs  Lab 01/12/18 0410  BNP 2,922.0*     Radiology/Studies:  Dg Chest 2 View  Result Date: 01/12/2018 CLINICAL DATA:  Acute onset of shortness of breath and lower leg pain and swelling. EXAM: CHEST - 2 VIEW COMPARISON:  Chest radiograph performed 02/14/2011 FINDINGS: The lungs are hypoexpanded. Vascular congestion is seen. Hazy bilateral  airspace opacities raise  concern for pulmonary edema. A small left pleural effusion is noted. No pneumothorax is seen. The cardiomediastinal silhouette is mildly enlarged. A pacemaker/AICD is noted at the left chest wall, with leads ending at the right atrium and right ventricle. No acute osseous abnormalities are identified. IMPRESSION: Lungs hypoexpanded. Vascular congestion and mild cardiomegaly. Hazy bilateral airspace opacities raise concern for pulmonary edema. Small left pleural effusion noted. Electronically Signed   By: Roanna Raider M.D.   On: 01/12/2018 04:59    Assessment and Plan:   1.  Acute on chronic combined heart failure with fluid overload.  True baseline weight is uncertain, although per Dr. Lubertha Basque note in August she was felt to be euvolemic with weight in the 270s.  Currently 310 pounds.  2.  Nonischemic cardiomyopathy with LVEF 20 to 25% and moderate diastolic dysfunction, mild RV dysfunction.  PA systolic pressure moderately elevated.  Cardiac catheterization showed normal coronaries in 2012 and previous cardiac MRI did not demonstrate infiltrative disease.  3.  CKD stage 3 with acute renal insufficiency in the setting of diuresis.  4.  Chronic ICD in place.  No reported recent device shocks or syncope.  She does have a history of recurrent VT and was previously on amiodarone although self-discontinued the medication due to nausea.  5.  Essential hypertension.  6.  Type 2 diabetes mellitus.  7.  History of stroke, on Plavix as an outpatient.  I discussed with Dr. Kerry Hough.  Agree with stopping losartan. Would also hold Aldactone.  Continue Toprol-XL but cut back dose for now, will start Lasix infusion and also hydralazine/Imdur, needs follow-up on urine output and creatinine. She has had very difficult IV access and would recommend placement of a PICC line as this would also allow assessment of co-oximetry.  Depending on clinical course with diuresis and subsequent renal  function, determination can be made as to whether inotropic therapy would be useful in which case she would need to be transferred to [facility] for evaluation by the Heart Failure team.   Signed, Nona Dell, MD  01/14/2018 10:04 AM

## 2018-01-14 NOTE — Progress Notes (Signed)
Patient had loss of IV access. Patient is a difficult IV start and lab was unable to draw blood this am. Notified MD and received order for a mid-line to be placed.

## 2018-01-14 NOTE — Progress Notes (Signed)
PROGRESS NOTE    Katherine [name]  SFS:[id number] DOB: 05-22-1960 DOA: 01/12/2018 PCP: Alvina Filbert, MD    Brief Narrative:  57 year old female with a history of cardiomyopathy, low ejection fraction, lupus, chronic kidney disease stage III, hypertension and diabetes who presented to the hospital with worsening shortness of breath.  She was found to be in decompensated CHF and admitted for IV diuresis.   Assessment & Plan:   Active Problems:   Non-ischemic cardiomyopathy (HCC)   AICD (automatic cardioverter/defibrillator) present   Diabetes mellitus with stage 3 chronic kidney disease (HCC)   Dyslipidemia   Lupus (HCC)   Acute on chronic combined systolic and diastolic CHF (congestive heart failure) (HCC)   Morbid obesity due to excess calories (HCC)   1. Acute on chronic combined CHF.  Echocardiogram performed shows ejection fraction of 20 to 25%.Urine output has been unimpressive.  Creatinine is trending up.  Losartan and Aldactone currently on hold due to renal dysfunction.  Continue on beta-blockers.  Seen by cardiology and IV Lasix twice daily changed to Lasix infusion.  Continue to monitor renal function and urine output. 2. Lupus.  Continue on Plaquenil.  No evidence of flares at this time. 3. Chronic kidney disease stage III.  Creatinine has trended up with diuresis.  Now on a Lasix infusion.  Continue to monitor renal function and urine output. 4. Diabetes.  Blood sugars have been stable.  NPH currently on hold.  Continue to monitor on sliding scale.. 5. Hyperlipidemia.  Continue statin 6. Hypertension.  Clonidine and losartan currently on hold.  Continue on beta-blockers. 7. AICD.  History of ventricular tachycardia.  Was recently started on amiodarone by her electrophysiologist, but stopped taking this 1 week ago due to GI upset.   DVT prophylaxis: Heparin Code Status: Full code Family Communication: No family present Disposition Plan: Skilled nursing facility  placement on discharge   Consultants:   Cardiology  Procedures:  Echo:- Left ventricle: The cavity size was moderately dilated. Wall   thickness was increased in a pattern of mild LVH. Systolic   function was severely reduced. The estimated ejection fraction   was in the range of 20% to 25%. Diffuse hypokinesis. Features are   consistent with a pseudonormal left ventricular filling pattern,   with concomitant abnormal relaxation and increased filling   pressure (grade 2 diastolic dysfunction). - Ventricular septum: Septal motion showed abnormal function and   dyssynergy. - Aortic valve: Mildly calcified annulus. Trileaflet; mildly   calcified leaflets. There was mild regurgitation. - Mitral valve: Mildly calcified annulus. There was mild   regurgitation. - Left atrium: The atrium was mildly dilated. - Right ventricle: The cavity size was mildly dilated. Pacer wire   or catheter noted in right ventricle. Systolic function was   mildly reduced. - Right atrium: The atrium was mildly dilated. - Tricuspid valve: There was mild regurgitation. - Pulmonary arteries: Systolic pressure was moderately increased.   PA peak pressure: 46 mm Hg (S). - Pericardium, extracardiac: A trivial pericardial effusion was    identified.  Antimicrobials:       Subjective: Overall feels breathing is slowly improving.  Has some nausea this morning.  Has not had a bowel movement 2 days.  Feels abdomen is full.  Objective: Vitals:   01/13/18 2206 01/14/18 0629 01/14/18 0805 01/14/18 1455  BP: (!) 114/91 (!) 140/104  124/82  Pulse: 69 72  73  Resp: 18 19  20   Temp: (!) 97.3 F (36.3 C) 98.9 F (37.2 C)  TempSrc: Oral     SpO2: 100% 100% 99% 100%  Weight:  (!) 141 kg    Height:        Intake/Output Summary (Last 24 hours) at 01/14/2018 1757 Last data filed at 01/14/2018 1709 Gross per 24 hour  Intake 29.31 ml  Output -  Net 29.31 ml   Filed Weights   01/13/18 0500 01/13/18 0624  01/14/18 0629  Weight: (!) 140.4 kg (!) 137.5 kg (!) 141 kg    Examination:  General exam: Alert, awake, oriented x 3 Respiratory system: Crackles at bases. Respiratory effort normal. Cardiovascular system:RRR. No murmurs, rubs, gallops. Gastrointestinal system: Abdomen is nondistended, soft and nontender. No organomegaly or masses felt. Normal bowel sounds heard. Central nervous system: Alert and oriented. No focal neurological deficits. Extremities: 2+ lower extremity edema bilaterally Skin: No rashes, lesions or ulcers Psychiatry: Judgement and insight appear normal. Mood & affect appropriate.     Data Reviewed: I have personally reviewed following labs and imaging studies  CBC: Recent Labs  Lab 01/12/18 0410  WBC 5.4  NEUTROABS 2.6  HGB 12.9  HCT 40.3  MCV 91.8  PLT 240   Basic Metabolic Panel: Recent Labs  Lab 01/12/18 0410 01/13/18 0942 01/14/18 1257  NA 137 141 141  K 5.2* 4.7 4.6  CL 108 108 107  CO2 21* 19* 22  GLUCOSE 148* 110* 153*  BUN 22* 31* 40*  CREATININE 1.65* 2.24* 2.49*  CALCIUM 8.2* 8.8* 8.8*   GFR: Estimated Creatinine Clearance: 39.5 mL/min (A) (by C-G formula based on SCr of 2.49 mg/dL (H)). Liver Function Tests: Recent Labs  Lab 01/12/18 0410  AST 52*  ALT 38  ALKPHOS 127*  BILITOT 3.4*  PROT 7.0  ALBUMIN 2.9*   No results for input(s): LIPASE, AMYLASE in the last 168 hours. No results for input(s): AMMONIA in the last 168 hours. Coagulation Profile: No results for input(s): INR, PROTIME in the last 168 hours. Cardiac Enzymes: Recent Labs  Lab 01/12/18 0410  TROPONINI <0.03   BNP (last 3 results) No results for input(s): PROBNP in the last 8760 hours. HbA1C: No results for input(s): HGBA1C in the last 72 hours. CBG: Recent Labs  Lab 01/13/18 1625 01/13/18 2207 01/14/18 0752 01/14/18 1131 01/14/18 1725  GLUCAP 119* 148* 118* 131* 160*   Lipid Profile: No results for input(s): CHOL, HDL, LDLCALC, TRIG, CHOLHDL,  LDLDIRECT in the last 72 hours. Thyroid Function Tests: No results for input(s): TSH, T4TOTAL, FREET4, T3FREE, THYROIDAB in the last 72 hours. Anemia Panel: No results for input(s): VITAMINB12, FOLATE, FERRITIN, TIBC, IRON, RETICCTPCT in the last 72 hours. Sepsis Labs: No results for input(s): PROCALCITON, LATICACIDVEN in the last 168 hours.  No results found for this or any previous visit (from the past 240 hour(s)).       Radiology Studies: No results found.      Scheduled Meds: . clopidogrel  75 mg Oral Daily  . heparin  5,000 Units Subcutaneous Q8H  . hydrALAZINE  20 mg Oral Q8H  . insulin aspart  0-15 Units Subcutaneous TID WC  . insulin aspart  0-5 Units Subcutaneous QHS  . isosorbide mononitrate  30 mg Oral Daily  . loratadine  10 mg Oral Daily  . [START ON 01/15/2018] metoprolol succinate  50 mg Oral Daily  . mometasone-formoterol  2 puff Inhalation BID  . simvastatin  20 mg Oral QHS  . sodium chloride flush  10-40 mL Intracatheter Q12H  . sodium chloride flush  3 mL Intravenous  Q12H  . venlafaxine XR  150 mg Oral Daily   Continuous Infusions: . sodium chloride    . furosemide (LASIX) infusion 4 mg/hr (01/14/18 1709)     LOS: 2 days    Time spent:    Erick Blinks, MD [facility] Pager [phone]  If 7PM-7AM, please contact night-coverage [email or web address] Password [facility] 01/14/2018, 5:57 PM

## 2018-01-14 NOTE — Progress Notes (Signed)
Peripherally Inserted Central Catheter/Midline Placement  The IV Nurse has discussed with the patient and/or persons authorized to consent for the patient, the purpose of this procedure and the potential benefits and risks involved with this procedure.  The benefits include less needle sticks, lab draws from the catheter, and the patient may be discharged home with the catheter. Risks include, but not limited to, infection, bleeding, blood clot (thrombus formation), and puncture of an artery; nerve damage and irregular heartbeat and possibility to perform a PICC exchange if needed/ordered by physician.  Alternatives to this procedure were also discussed.  Bard Power PICC patient education guide, fact sheet on infection prevention and patient information card has been provided to patient /or left at bedside.    PICC/Midline Placement Documentation  PICC Double Lumen 01/14/18 PICC Right Basilic 41 cm 0 cm (Active)  Indication for Insertion or Continuance of Line Poor Vasculature-patient has had multiple peripheral attempts or PIVs lasting less than 24 hours 01/14/2018 11:39 AM  Exposed Catheter (cm) 0 cm 01/14/2018 11:39 AM  Site Assessment Clean;Dry;Intact 01/14/2018 11:39 AM  Lumen #1 Status Flushed;Blood return noted;Saline locked 01/14/2018 11:39 AM  Lumen #2 Status Flushed;Blood return noted;Saline locked 01/14/2018 11:39 AM  Dressing Type Transparent;Securing device 01/14/2018 11:39 AM  Dressing Status Clean;Dry;Intact;Antimicrobial disc in place 01/14/2018 11:39 AM  Dressing Change Due 01/21/18 01/14/2018 11:39 AM       Romie Jumper 01/14/2018, 11:42 AM

## 2018-01-15 LAB — BASIC METABOLIC PANEL
Anion gap: 9 (ref 5–15)
BUN: 39 mg/dL — ABNORMAL HIGH (ref 6–20)
CHLORIDE: 108 mmol/L (ref 98–111)
CO2: 24 mmol/L (ref 22–32)
Calcium: 8.3 mg/dL — ABNORMAL LOW (ref 8.9–10.3)
Creatinine, Ser: 2.2 mg/dL — ABNORMAL HIGH (ref 0.44–1.00)
GFR calc Af Amer: 28 mL/min — ABNORMAL LOW (ref >60)
GFR calc non Af Amer: 24 mL/min — ABNORMAL LOW (ref >60)
GLUCOSE: 185 mg/dL — AB (ref 70–99)
Potassium: 4.1 mmol/L (ref 3.5–5.1)
Sodium: 141 mmol/L (ref 135–145)

## 2018-01-15 LAB — GLUCOSE, CAPILLARY
Glucose-Capillary: 153 mg/dL — ABNORMAL HIGH (ref 70–99)
Glucose-Capillary: 167 mg/dL — ABNORMAL HIGH (ref 70–99)
Glucose-Capillary: 133 mg/dL — ABNORMAL HIGH (ref 70–99)
Glucose-Capillary: 165 mg/dL — ABNORMAL HIGH (ref 70–99)

## 2018-01-15 MED ORDER — HYDROCODONE-ACETAMINOPHEN 5-325 MG PO TABS
1.0000 | ORAL_TABLET | Freq: Four times a day (QID) | ORAL | Status: DC | PRN
  Administered 2018-01-15 – 2018-01-23 (×18): 1 via ORAL
  Filled 2018-01-15 (×18): qty 1

## 2018-01-15 NOTE — Progress Notes (Signed)
PROGRESS NOTE    Katherine [name]  WUJ:[id number] DOB: 1960/09/07 DOA: 01/12/2018 PCP: Alvina Filbert, MD    Brief Narrative:  57 year old female with a history of cardiomyopathy, low ejection fraction, lupus, chronic kidney disease stage III, hypertension and diabetes who presented to the hospital with worsening shortness of breath.  She was found to be in decompensated CHF and admitted for IV diuresis.   Assessment & Plan:   Active Problems:   Non-ischemic cardiomyopathy (HCC)   AICD (automatic cardioverter/defibrillator) present   Diabetes mellitus with stage 3 chronic kidney disease (HCC)   Dyslipidemia   Lupus (HCC)   Acute on chronic combined systolic and diastolic CHF (congestive heart failure) (HCC)   Morbid obesity due to excess calories (HCC)   1. Acute on chronic combined CHF.  Echocardiogram performed shows ejection fraction of 20 to 25%.initially placed on IV Lasix twice daily which resulted in rising creatinine.  Losartan and Aldactone currently on hold due to renal dysfunction.  Continue on beta-blockers.  Seen by cardiology and IV Lasix twice daily changed to Lasix infusion.  Urine output appears to be improving and renal function appears to be stable.  Continue to monitor renal function and urine output. 2. Lupus.  Continue on Plaquenil.  No evidence of flares at this time. 3. Chronic kidney disease stage III.  Creatinine has trended up with diuresis.  Now on a Lasix infusion.  Renal function appears to be stabilizing.  Continue to monitor renal function and urine output. 4. Diabetes.  Blood sugars have been stable.  NPH currently on hold.  Continue to monitor on sliding scale. 5. Hyperlipidemia.  Continue statin 6. Hypertension.  Clonidine and losartan currently on hold.  Continue on beta-blockers. 7. AICD.  History of ventricular tachycardia.  Was recently started on amiodarone by her electrophysiologist, but stopped taking this 1 week ago due to GI upset.   DVT  prophylaxis: Heparin Code Status: Full code Family Communication: No family present Disposition Plan: Skilled nursing facility placement on discharge   Consultants:   Cardiology  Procedures:  Echo:- Left ventricle: The cavity size was moderately dilated. Wall   thickness was increased in a pattern of mild LVH. Systolic   function was severely reduced. The estimated ejection fraction   was in the range of 20% to 25%. Diffuse hypokinesis. Features are   consistent with a pseudonormal left ventricular filling pattern,   with concomitant abnormal relaxation and increased filling   pressure (grade 2 diastolic dysfunction). - Ventricular septum: Septal motion showed abnormal function and   dyssynergy. - Aortic valve: Mildly calcified annulus. Trileaflet; mildly   calcified leaflets. There was mild regurgitation. - Mitral valve: Mildly calcified annulus. There was mild   regurgitation. - Left atrium: The atrium was mildly dilated. - Right ventricle: The cavity size was mildly dilated. Pacer wire   or catheter noted in right ventricle. Systolic function was   mildly reduced. - Right atrium: The atrium was mildly dilated. - Tricuspid valve: There was mild regurgitation. - Pulmonary arteries: Systolic pressure was moderately increased.   PA peak pressure: 46 mm Hg (S). - Pericardium, extracardiac: A trivial pericardial effusion was    identified.  Antimicrobials:       Subjective: Still short of breath.  Nausea is better today.  No chest pain  Objective: Vitals:   01/14/18 2022 01/14/18 2229 01/15/18 0604 01/15/18 0800  BP:  116/88 106/81   Pulse:  68 69   Resp:  20    Temp:  97.7 F (36.5 C) 98.2 F (36.8 C)   TempSrc:  Oral Oral   SpO2: 98% 100% 100% 94%  Weight:   (!) 139.9 kg   Height:        Intake/Output Summary (Last 24 hours) at 01/15/2018 1148 Last data filed at 01/15/2018 1000 Gross per 24 hour  Intake 302.45 ml  Output 1500 ml  Net -1197.55 ml    Filed Weights   01/13/18 0624 01/14/18 0629 01/15/18 0604  Weight: (!) 137.5 kg (!) 141 kg (!) 139.9 kg    Examination:  General exam: Alert, awake, oriented x 3 Respiratory system: Crackles at bases. Respiratory effort normal. Cardiovascular system:RRR. No murmurs, rubs, gallops. Gastrointestinal system: Abdomen is nondistended, soft and nontender. No organomegaly or masses felt. Normal bowel sounds heard. Central nervous system: Alert and oriented. No focal neurological deficits. Extremities: 1-2+ edema bilaterally Skin: No rashes, lesions or ulcers Psychiatry: Judgement and insight appear normal. Mood & affect appropriate.      Data Reviewed: I have personally reviewed following labs and imaging studies  CBC: Recent Labs  Lab 01/12/18 0410  WBC 5.4  NEUTROABS 2.6  HGB 12.9  HCT 40.3  MCV 91.8  PLT 240   Basic Metabolic Panel: Recent Labs  Lab 01/12/18 0410 01/13/18 0942 01/14/18 1257 01/15/18 0803  NA 137 141 141 141  K 5.2* 4.7 4.6 4.1  CL 108 108 107 108  CO2 21* 19* 22 24  GLUCOSE 148* 110* 153* 185*  BUN 22* 31* 40* 39*  CREATININE 1.65* 2.24* 2.49* 2.20*  CALCIUM 8.2* 8.8* 8.8* 8.3*   GFR: Estimated Creatinine Clearance: 44.5 mL/min (A) (by C-G formula based on SCr of 2.2 mg/dL (H)). Liver Function Tests: Recent Labs  Lab 01/12/18 0410  AST 52*  ALT 38  ALKPHOS 127*  BILITOT 3.4*  PROT 7.0  ALBUMIN 2.9*   No results for input(s): LIPASE, AMYLASE in the last 168 hours. No results for input(s): AMMONIA in the last 168 hours. Coagulation Profile: No results for input(s): INR, PROTIME in the last 168 hours. Cardiac Enzymes: Recent Labs  Lab 01/12/18 0410  TROPONINI <0.03   BNP (last 3 results) No results for input(s): PROBNP in the last 8760 hours. HbA1C: No results for input(s): HGBA1C in the last 72 hours. CBG: Recent Labs  Lab 01/14/18 1131 01/14/18 1725 01/14/18 2223 01/15/18 0718 01/15/18 1104  GLUCAP 131* 160* 171* 153*  167*   Lipid Profile: No results for input(s): CHOL, HDL, LDLCALC, TRIG, CHOLHDL, LDLDIRECT in the last 72 hours. Thyroid Function Tests: No results for input(s): TSH, T4TOTAL, FREET4, T3FREE, THYROIDAB in the last 72 hours. Anemia Panel: No results for input(s): VITAMINB12, FOLATE, FERRITIN, TIBC, IRON, RETICCTPCT in the last 72 hours. Sepsis Labs: No results for input(s): PROCALCITON, LATICACIDVEN in the last 168 hours.  No results found for this or any previous visit (from the past 240 hour(s)).       Radiology Studies: No results found.      Scheduled Meds: . clopidogrel  75 mg Oral Daily  . heparin  5,000 Units Subcutaneous Q8H  . hydrALAZINE  20 mg Oral Q8H  . insulin aspart  0-15 Units Subcutaneous TID WC  . insulin aspart  0-5 Units Subcutaneous QHS  . isosorbide mononitrate  30 mg Oral Daily  . loratadine  10 mg Oral Daily  . metoprolol succinate  50 mg Oral Daily  . mometasone-formoterol  2 puff Inhalation BID  . polyethylene glycol  17 g Oral Daily  .  simvastatin  20 mg Oral QHS  . sodium chloride flush  10-40 mL Intracatheter Q12H  . sodium chloride flush  3 mL Intravenous Q12H  . venlafaxine XR  150 mg Oral Daily   Continuous Infusions: . sodium chloride    . furosemide (LASIX) infusion 4 mg/hr (01/15/18 0400)     LOS: 3 days    Time spent:    Erick Blinks, MD [facility] Pager [phone]  If 7PM-7AM, please contact night-coverage [email or web address] Password [facility] 01/15/2018, 11:48 AM

## 2018-01-16 LAB — GLUCOSE, CAPILLARY
Glucose-Capillary: 141 mg/dL — ABNORMAL HIGH (ref 70–99)
Glucose-Capillary: 181 mg/dL — ABNORMAL HIGH (ref 70–99)
Glucose-Capillary: 134 mg/dL — ABNORMAL HIGH (ref 70–99)
Glucose-Capillary: 126 mg/dL — ABNORMAL HIGH (ref 70–99)

## 2018-01-16 LAB — BASIC METABOLIC PANEL
Anion gap: 10 (ref 5–15)
BUN: 34 mg/dL — ABNORMAL HIGH (ref 6–20)
CO2: 26 mmol/L (ref 22–32)
Calcium: 8.5 mg/dL — ABNORMAL LOW (ref 8.9–10.3)
Chloride: 105 mmol/L (ref 98–111)
Creatinine, Ser: 1.94 mg/dL — ABNORMAL HIGH (ref 0.44–1.00)
GFR calc Af Amer: 33 mL/min — ABNORMAL LOW (ref >60)
GFR calc non Af Amer: 28 mL/min — ABNORMAL LOW (ref >60)
Glucose, Bld: 181 mg/dL — ABNORMAL HIGH (ref 70–99)
POTASSIUM: 3.6 mmol/L (ref 3.5–5.1)
Sodium: 141 mmol/L (ref 135–145)

## 2018-01-16 NOTE — Progress Notes (Signed)
PROGRESS NOTE    Katherine [name]  GNF:[id number] DOB: 04-21-1960 DOA: 01/12/2018 PCP: Alvina Filbert, MD    Brief Narrative:  57 year old female with a history of cardiomyopathy, low ejection fraction, lupus, chronic kidney disease stage III, hypertension and diabetes who presented to the hospital with worsening shortness of breath.  She was found to be in decompensated CHF and admitted for IV diuresis.   Assessment & Plan:   Active Problems:   Non-ischemic cardiomyopathy (HCC)   AICD (automatic cardioverter/defibrillator) present   Diabetes mellitus with stage 3 chronic kidney disease (HCC)   Dyslipidemia   Lupus (HCC)   Acute on chronic combined systolic and diastolic CHF (congestive heart failure) (HCC)   Morbid obesity due to excess calories (HCC)   1. Acute on chronic combined CHF.  Echocardiogram performed shows ejection fraction of 20 to 25%. Initially placed on IV Lasix twice daily which resulted in rising creatinine.  Losartan and Aldactone currently on hold due to renal dysfunction.  Continue on beta-blockers.  Seen by cardiology and IV Lasix twice daily changed to Lasix infusion.  She had good urine output yesterday and creatinine has started to trend down.  Continue to monitor renal function and urine output. 2. Lupus.  Continue on Plaquenil.  No evidence of flares at this time. 3. Chronic kidney disease stage III.  Creatinine had initially trended up with diuresis.  Now on a Lasix infusion.  Renal function appears to be stabilizing.  Continue to monitor renal function and urine output. 4. Diabetes.  Blood sugars have been stable.  NPH currently on hold.  Continue to monitor on sliding scale. 5. Hyperlipidemia.  Continue statin 6. Hypertension.  Clonidine and losartan currently on hold.  Continue on beta-blockers. 7. AICD.  History of ventricular tachycardia.  Was recently started on amiodarone by her electrophysiologist, but stopped taking this 1 week ago due to GI  upset.   DVT prophylaxis: Heparin Code Status: Full code Family Communication: No family present Disposition Plan: Skilled nursing facility placement on discharge   Consultants:   Cardiology  Procedures:  Echo:- Left ventricle: The cavity size was moderately dilated. Wall   thickness was increased in a pattern of mild LVH. Systolic   function was severely reduced. The estimated ejection fraction   was in the range of 20% to 25%. Diffuse hypokinesis. Features are   consistent with a pseudonormal left ventricular filling pattern,   with concomitant abnormal relaxation and increased filling   pressure (grade 2 diastolic dysfunction). - Ventricular septum: Septal motion showed abnormal function and   dyssynergy. - Aortic valve: Mildly calcified annulus. Trileaflet; mildly   calcified leaflets. There was mild regurgitation. - Mitral valve: Mildly calcified annulus. There was mild   regurgitation. - Left atrium: The atrium was mildly dilated. - Right ventricle: The cavity size was mildly dilated. Pacer wire   or catheter noted in right ventricle. Systolic function was   mildly reduced. - Right atrium: The atrium was mildly dilated. - Tricuspid valve: There was mild regurgitation. - Pulmonary arteries: Systolic pressure was moderately increased.   PA peak pressure: 46 mm Hg (S). - Pericardium, extracardiac: A trivial pericardial effusion was    identified.  Antimicrobials:       Subjective: Continues to have significant lower extremity edema.  Feels that her legs are still very heavy.  She is concerned that her left lower extremity appears to be more edematous than right  Objective: Vitals:   01/16/18 0500 01/16/18 0535 01/16/18 1004 01/16/18 1325  BP:  112/75  112/68  Pulse:  72  76  Resp:  18  18  Temp:  (!) 97.5 F (36.4 C)  (!) 97.2 F (36.2 C)  TempSrc:  Oral  Oral  SpO2:  98% 97% 100%  Weight: (!) 137.2 kg     Height:        Intake/Output Summary (Last 24  hours) at 01/16/2018 1729 Last data filed at 01/16/2018 1500 Gross per 24 hour  Intake 1053.81 ml  Output 1550 ml  Net -496.19 ml   Filed Weights   01/14/18 0629 01/15/18 0604 01/16/18 0500  Weight: (!) 141 kg (!) 139.9 kg (!) 137.2 kg    Examination:  General exam: Alert, awake, oriented x 3 Respiratory system: Clear to auscultation. Respiratory effort normal. Cardiovascular system:RRR. No murmurs, rubs, gallops. Gastrointestinal system: Abdomen is nondistended, soft and nontender. No organomegaly or masses felt. Normal bowel sounds heard. Central nervous system: Alert and oriented. No focal neurological deficits. Extremities: 2+ edema in the left lower extremity, 1+ in the right lower extremity Skin: No rashes, lesions or ulcers Psychiatry: Judgement and insight appear normal. Mood & affect appropriate.   Data Reviewed: I have personally reviewed following labs and imaging studies  CBC: Recent Labs  Lab 01/12/18 0410  WBC 5.4  NEUTROABS 2.6  HGB 12.9  HCT 40.3  MCV 91.8  PLT 240   Basic Metabolic Panel: Recent Labs  Lab 01/12/18 0410 01/13/18 0942 01/14/18 1257 01/15/18 0803 01/16/18 0600  NA 137 141 141 141 141  K 5.2* 4.7 4.6 4.1 3.6  CL 108 108 107 108 105  CO2 21* 19* 22 24 26   GLUCOSE 148* 110* 153* 185* 181*  BUN 22* 31* 40* 39* 34*  CREATININE 1.65* 2.24* 2.49* 2.20* 1.94*  CALCIUM 8.2* 8.8* 8.8* 8.3* 8.5*   GFR: Estimated Creatinine Clearance: 49.9 mL/min (A) (by C-G formula based on SCr of 1.94 mg/dL (H)). Liver Function Tests: Recent Labs  Lab 01/12/18 0410  AST 52*  ALT 38  ALKPHOS 127*  BILITOT 3.4*  PROT 7.0  ALBUMIN 2.9*   No results for input(s): LIPASE, AMYLASE in the last 168 hours. No results for input(s): AMMONIA in the last 168 hours. Coagulation Profile: No results for input(s): INR, PROTIME in the last 168 hours. Cardiac Enzymes: Recent Labs  Lab 01/12/18 0410  TROPONINI <0.03   BNP (last 3 results) No results for  input(s): PROBNP in the last 8760 hours. HbA1C: No results for input(s): HGBA1C in the last 72 hours. CBG: Recent Labs  Lab 01/15/18 1612 01/15/18 2223 01/16/18 0716 01/16/18 1123 01/16/18 1649  GLUCAP 133* 165* 141* 181* 134*   Lipid Profile: No results for input(s): CHOL, HDL, LDLCALC, TRIG, CHOLHDL, LDLDIRECT in the last 72 hours. Thyroid Function Tests: No results for input(s): TSH, T4TOTAL, FREET4, T3FREE, THYROIDAB in the last 72 hours. Anemia Panel: No results for input(s): VITAMINB12, FOLATE, FERRITIN, TIBC, IRON, RETICCTPCT in the last 72 hours. Sepsis Labs: No results for input(s): PROCALCITON, LATICACIDVEN in the last 168 hours.  No results found for this or any previous visit (from the past 240 hour(s)).       Radiology Studies: No results found.      Scheduled Meds: . clopidogrel  75 mg Oral Daily  . heparin  5,000 Units Subcutaneous Q8H  . hydrALAZINE  20 mg Oral Q8H  . insulin aspart  0-15 Units Subcutaneous TID WC  . insulin aspart  0-5 Units Subcutaneous QHS  . isosorbide mononitrate  30 mg Oral Daily  . loratadine  10 mg Oral Daily  . metoprolol succinate  50 mg Oral Daily  . mometasone-formoterol  2 puff Inhalation BID  . polyethylene glycol  17 g Oral Daily  . simvastatin  20 mg Oral QHS  . sodium chloride flush  10-40 mL Intracatheter Q12H  . sodium chloride flush  3 mL Intravenous Q12H  . venlafaxine XR  150 mg Oral Daily   Continuous Infusions: . sodium chloride    . furosemide (LASIX) infusion 4 mg/hr (01/16/18 0400)     LOS: 4 days    Time spent: 35mins    Erick BlinksJehanzeb Memon, MD [facility] Pager [phone]  If 7PM-7AM, please contact night-coverage [email or web address] Password [email or web address] 01/16/2018, 5:29 PM

## 2018-01-17 ENCOUNTER — Inpatient Hospital Stay (HOSPITAL_COMMUNITY): Payer: Medicaid Other

## 2018-01-17 LAB — GLUCOSE, CAPILLARY
Glucose-Capillary: 144 mg/dL — ABNORMAL HIGH (ref 70–99)
Glucose-Capillary: 173 mg/dL — ABNORMAL HIGH (ref 70–99)
Glucose-Capillary: 116 mg/dL — ABNORMAL HIGH (ref 70–99)
Glucose-Capillary: 160 mg/dL — ABNORMAL HIGH (ref 70–99)

## 2018-01-17 LAB — BASIC METABOLIC PANEL
Anion gap: 7 (ref 5–15)
BUN: 26 mg/dL — ABNORMAL HIGH (ref 6–20)
CO2: 30 mmol/L (ref 22–32)
Calcium: 8.5 mg/dL — ABNORMAL LOW (ref 8.9–10.3)
Chloride: 104 mmol/L (ref 98–111)
Creatinine, Ser: 1.45 mg/dL — ABNORMAL HIGH (ref 0.44–1.00)
GFR calc Af Amer: 46 mL/min — ABNORMAL LOW (ref >60)
GFR calc non Af Amer: 40 mL/min — ABNORMAL LOW (ref >60)
Glucose, Bld: 153 mg/dL — ABNORMAL HIGH (ref 70–99)
Potassium: 3.5 mmol/L (ref 3.5–5.1)
Sodium: 141 mmol/L (ref 135–145)

## 2018-01-17 LAB — COOXEMETRY PANEL
Carboxyhemoglobin: 1.9 % — ABNORMAL HIGH (ref 0.5–1.5)
Methemoglobin: 0.7 % (ref 0.0–1.5)
O2 Saturation: 63.3 %
Total hemoglobin: 12.7 g/dL (ref 12.0–16.0)

## 2018-01-17 MED ORDER — POTASSIUM CHLORIDE CRYS ER 20 MEQ PO TBCR
20.0000 meq | EXTENDED_RELEASE_TABLET | Freq: Every day | ORAL | Status: DC
  Administered 2018-01-17 – 2018-01-21 (×5): 20 meq via ORAL
  Filled 2018-01-17 (×5): qty 1

## 2018-01-17 NOTE — Care Management (Signed)
Patient Information   Patient Name Katherine [name], Katherine [name] ([id number]) Sex Female DOB [birth year]  Room Bed  A317 A317-01  Patient Demographics   Address [address] [place] [id number] Phone [phone] (Home)  Patient Ethnicity & Race   Ethnic Group Patient Race  Not Hispanic or Latino Black or African American White or Caucasian  Emergency Contact(s)   Name Relation Home Work Mobile  Katherine [name],Katherine [name] Friend   [phone]  Documents on File    Status Date Received Description  Documents for the Patient  [place] HIPAA NOTICE OF PRIVACY - Scanned Received 01/13/18   [place] E-Signature HIPAA Notice of Privacy Received 12/05/10   [place] E-Signature HIPAA Notice of Privacy Spanish Unable to Obtain 01/12/18   Driver's License Received 12/05/10   Insurance Card Received 12/05/10   Advance Directives/Living Will/HCPOA/POA Not Received    Technical brewerinancial Application Not Received    Insurance Card Not Received    Driver's License Not Received    [facility] HIPAA NOTICE OF PRIVACY - Scanned Not Received    [place] E-Signature HIPAA Notice of Privacy Received 12/25/10   [place] E-Signature HIPAA Notice of Privacy Spanish Not Received    Driver's License Received 12/25/10   Insurance Card Received 10/11/13 MEDICAID  Advance Directives/Living Will/HCPOA/POA Not Received    Financial Application Not Received    Driver's License Not Received    Insurance Card Not Received    AMB Correspondence Not Received  07[facility]13-04/14 [facility]  [place] E-Signature HIPAA Notice of Privacy Received 11/17/12 [facility]/tst  Insurance Card Not Received    Insurance Card Received 11/17/12 [facility]/tst  AMB Correspondence Not Received  12/14 Letter Darrold SpanFogleman, K  [place] HIPAA NOTICE OF PRIVACY - Scanned Not Received     E-Signature HIPAA Notice of Privacy  01/13/18   Driver's License Not Received    Insurance Card Not Received    Advance Directives/Living  Will/HCPOA/POA Not Received    AMBCERTMAIL  10/23/13   Other Photo ID Not Received    Insurance Card Received 05/25/16 Medicaid 2018  Advanced Beneficiary Notice (ABN) Not Received    E-Signature AOB Spanish Not Received    AMB Patient Logs/Info  02/27/15   AMB Patient Logs/Info  03/12/15   AMB Patient Logs/Info  04/09/15   Release of Information Not Received    AMB Patient Logs/Info  09/27/15   AMB Correspondence  12/04/15 OFFICE NOTES [facility]  AMB Correspondence  07/14/16 REFERRAL [facility]  Driver's License (Deleted) 12/25/10   Insurance Card (Deleted) 12/25/10   AMB Patient Logs/Info (Deleted) 04/09/15   Documents for the Encounter  AOB (Assignment of Insurance Benefits) Received 01/13/18   E-signature AOB Unable to Obtain 01/12/18   MEDICARE RIGHTS Not Received    E-signature Medicare Rights     ED Patient Billing Extract   ED PB Billing Extract  EMS Run Sheet Received 01/12/18   Cardiac Monitoring Strip Shift Summary Received 01/12/18   Cardiac Monitoring Strip Received 01/12/18   Ultrasound Received 01/17/18   EKG Received 01/13/18   Admission Information   Current Information   Attending Provider Admitting Provider Admission Type Admission Status  Katherine [name], Jehanzeb, Katherine [name] Katherine [name], Vasundhra, Katherine [name] Emergency Admission (Confirmed)       Admission Date/Time Discharge Date Hospital Service Auth/Cert Status  01/12/18 03:28 AM  Internal Medicine Incomplete       Hospital Area Unit Room/Bed   [facility] AP-DEPT 300 A317/A317-01  Admission   Complaint  .  Hospital Account   Name Acct ID Class Status Primary Coverage  Katherine [name], Katherine [name] [id number] Inpatient Open MEDICAID [place] - MEDICAID [place] ACCESS      Guarantor Account (for Hospital Account [id number])   Name Relation to Pt Service Area Active? Acct Type  Katherine [name] Self CHSA Yes Personal/Family  Address Phone    [address]  [address] [place], [place] [address] [phone](H)        Coverage Information (for Hospital Account [id number])   F/O Payor/Plan Precert #  MEDICAID [place]/MEDICAID [place] ACCESS   Subscriber Subscriber #  Katherine [name], Katherine [name] [id number] N  Address Phone  [address] [address] [place], [place] [address] [phone]       Care Everywhere ID:  [id number]

## 2018-01-17 NOTE — Progress Notes (Addendum)
Progress Note  Patient Name: Katherine [name] Date of Encounter: 01/17/2018  Primary Cardiologist: Lewayne Bunting, MD  Subjective   Reports gradual improvement in sx, major issue is arthritis pains. Swelling persists, L>R but not as bad as it was. LE venous duplex planned.  Inpatient Medications    Scheduled Meds: . clopidogrel  75 mg Oral Daily  . heparin  5,000 Units Subcutaneous Q8H  . hydrALAZINE  20 mg Oral Q8H  . insulin aspart  0-15 Units Subcutaneous TID WC  . insulin aspart  0-5 Units Subcutaneous QHS  . isosorbide mononitrate  30 mg Oral Daily  . loratadine  10 mg Oral Daily  . metoprolol succinate  50 mg Oral Daily  . mometasone-formoterol  2 puff Inhalation BID  . polyethylene glycol  17 g Oral Daily  . simvastatin  20 mg Oral QHS  . sodium chloride flush  10-40 mL Intracatheter Q12H  . sodium chloride flush  3 mL Intravenous Q12H  . venlafaxine XR  150 mg Oral Daily   Continuous Infusions: . sodium chloride    . furosemide (LASIX) infusion 4 mg/hr (01/17/18 0541)   PRN Meds: sodium chloride, acetaminophen, alum & mag hydroxide-simeth, HYDROcodone-acetaminophen, magnesium hydroxide, promethazine, sodium chloride flush, sodium chloride flush   Vital Signs    Vitals:   01/16/18 2100 01/17/18 0500 01/17/18 0532 01/17/18 0740  BP: 123/85  107/75   Pulse: 86  91   Resp: 20  18   Temp: 98 F (36.7 C)  97.7 F (36.5 C)   TempSrc: Oral  Oral   SpO2: 100%  99% 97%  Weight:  135.2 kg    Height:        Intake/Output Summary (Last 24 hours) at 01/17/2018 4103 Last data filed at 01/17/2018 0600 Gross per 24 hour  Intake 1063.65 ml  Output 1950 ml  Net -886.35 ml   Filed Weights   01/15/18 0604 01/16/18 0500 01/17/18 0500  Weight: (!) 139.9 kg (!) 137.2 kg 135.2 kg    Telemetry    N/A - Personally Reviewed  Physical Exam   GEN: No acute distress, morbidly obese HEENT: Normocephalic, atraumatic, sclera non-icteric. Neck: + Elev JVD Cardiac:  RRR no murmurs, rubs, or gallops.  Radials/DP/PT 1+ and equal bilaterally.  Respiratory: Diffusely diminished throughout, coarse BS at bases. Breathing is unlabored. GI: Soft, nontender, non-distended, BS +x 4. MS: no acute deformity. Extremities: No clubbing or cyanosis. 1+ RLE, 2+ LLE edema.  Neuro:  AAOx3. Follows commands. Psych:  Responds to questions appropriately with a normal affect.  Labs    Chemistry Recent Labs  Lab 01/12/18 0410  01/15/18 0803 01/16/18 0600 01/17/18 0610  NA 137   < > 141 141 141  K 5.2*   < > 4.1 3.6 3.5  CL 108   < > 108 105 104  CO2 21*   < > 24 26 30   GLUCOSE 148*   < > 185* 181* 153*  BUN 22*   < > 39* 34* 26*  CREATININE 1.65*   < > 2.20* 1.94* 1.45*  CALCIUM 8.2*   < > 8.3* 8.5* 8.5*  PROT 7.0  --   --   --   --   ALBUMIN 2.9*  --   --   --   --   AST 52*  --   --   --   --   ALT 38  --   --   --   --   ALKPHOS 127*  --   --   --   --  BILITOT 3.4*  --   --   --   --   GFRNONAA 34*   < > 24* 28* 40*  GFRAA 40*   < > 28* 33* 46*  ANIONGAP 8   < > 9 10 7    < > = values in this interval not displayed.     Hematology Recent Labs  Lab 01/12/18 0410  WBC 5.4  RBC 4.39  HGB 12.9  HCT 40.3  MCV 91.8  MCH 29.4  MCHC 32.0  RDW 22.5*  PLT 240    Cardiac Enzymes Recent Labs  Lab 01/12/18 0410  TROPONINI <0.03   No results for input(s): TROPIPOC in the last 168 hours.   BNP Recent Labs  Lab 01/12/18 0410  BNP 2,922.0*     DDimer No results for input(s): DDIMER in the last 168 hours.   Radiology    No results found.  Cardiac Studies   2D Echo 01/13/18 Study Conclusions  - Left ventricle: The cavity size was moderately dilated. Wall   thickness was increased in a pattern of mild LVH. Systolic   function was severely reduced. The estimated ejection fraction   was in the range of 20% to 25%. Diffuse hypokinesis. Features are   consistent with a pseudonormal left ventricular filling pattern,   with concomitant  abnormal relaxation and increased filling   pressure (grade 2 diastolic dysfunction). - Ventricular septum: Septal motion showed abnormal function and   dyssynergy. - Aortic valve: Mildly calcified annulus. Trileaflet; mildly   calcified leaflets. There was mild regurgitation. - Mitral valve: Mildly calcified annulus. There was mild   regurgitation. - Left atrium: The atrium was mildly dilated. - Right ventricle: The cavity size was mildly dilated. Pacer wire   or catheter noted in right ventricle. Systolic function was   mildly reduced. - Right atrium: The atrium was mildly dilated. - Tricuspid valve: There was mild regurgitation. - Pulmonary arteries: Systolic pressure was moderately increased.   PA peak pressure: 46 mm Hg (S). - Pericardium, extracardiac: A trivial pericardial effusion was   identified.  Patient Profile     57 y.o. female with chronic combined CHF, NICM (normal cors 2012, cMRI 2012 without infiltrative disease), asthma, fibromyalgia, SLE, CKD stage III, HTN, HLD, seizures, morbid obesity, DM, stroke, ventricular tachycardia with ICD In place, medication intolerances. In 08/2017 she had recurrent VT and was started on low dose amiodarone but patient discontinued due to GI upset. [facility]2017 and losartan was switched to lisinopril and Toprol was switched to carvedilol but she felt worse with these so they were switched back. She reported low sodium diet but was eating deli meat several times a week and tomato soup. She was readmitted 12/25 with worsening SOB, weight gain and edema.  Assessment & Plan    1. Acute on chronic combined CHF/NICM - seems to be making progress on Lasix gtt with wt 319->298 today, - 4.4L. Dry weight unclear given generalized weight gain each year, but was 276 in 08/2017 and 291 in 11/2017. ACEI/ARB/spiro on hold due to renal insufficiency this admission, now on Toprol, Imdur, hydralazine, Lasix gtt. PICC placed 12/27  with intention of measuring Co-ox and CVPs; not yet ordered - will place order as her body habitus does make complete assessment challenging. LE venous duplex planned for RLE asymmetry per orders. Will also place dietitian order for education re: low sodium diet, and change diet order to reflect this + fluid restriction. Reviewed 2g sodium  restriction, 2L fluid restriction, daily weights with patient. Would benefit from very close OP f/u and possibly even tele-medicine program.   2. AKI with CKD stage III - peak Cr 2.24, improving down to 1.4 range today.  3. H/o ventricular tachycardia/ICD in place - will review with Dr. Tenny Craw whether alternative therapy needs to be considered. Would be concerned that some of her med intolerances (nausea, dizziness) actually represented low output instead of medication issues. Consideration could be given to re-trial of lower dose amiodarone. Place on continuous telemetry given active diuresis and CHF. Add KCl while on diuresis (be cautious given mild hyperkalemia on admission).  4. Essential HTN - BP controlled.  5. Morbid obesity - consider sleep study as OP given elev pulm pressures on echo which could be in turn worsening CHF.  For questions or updates, please contact [facility] Please consult [email or web address] for contact info under Cardiology/STEMI.  Signed, Laurann Montana, PA-C 01/17/2018, 8:07 AM    Pt seen and examined  I agree with findings as noted by D Dunn above  Pt denies SOB at rest  No CP    Morbidly obese making assessment of volume difficult  Neck:  Full Lungs are rel clear Cardiac RRR   No S3 Ext L leg  1+   R leg Trace    Acute on chronic systolic CHF   Currently on lasix GTT   Follow I/O response  Agree with COOX  L leg with assymmetic swelling   LE venous duplex pending    Dietrich Pates

## 2018-01-17 NOTE — Plan of Care (Signed)
Nutrition Education Note  RD consulted for nutrition education regarding CHF.  RD provided "Heart Failure Nutrition Therapy and Weight Loss Tips" handouts from the Academy of Nutrition and Dietetics. Reviewed patient's dietary recall. Patient seasons with sea salt and does not like Ms Sharilyn Sites or NuSalt products.   Provided examples on ways to decrease sodium intake in diet. Discouraged intake of processed foods  and encouraged fresh fruits and vegetables as well as whole grain sources of carbohydrates to maximize fiber intake.   RD discussed why it is important for patient to adhere to diet recommendations, and emphasized the role of fluids, foods to avoid, and importance of weighing self daily. Teach back method used.  Expect fair compliance. Patient says when reviewing high sodium food items- "that's all the foods that taste good". Question based on her response whether she is ready to make significant dietary changes. RD re-emphasized the importance of compliance and long term health benefits.  Body mass index is 40.42 kg/m. Pt meets criteria for  morbid obesity based on current BMI. Desirable weight loss trend noted with diuresis.  Filed Weights   01/15/18 0604 01/16/18 0500 01/17/18 0500  Weight: (!) 139.9 kg (!) 137.2 kg 135.2 kg    Current diet order is Heart Healthy/CHO modified with 1500 ml fluid restriction. Reviewed with her this is approximately 6 cups of fluid daily. Encouraged her to confirm fluid goal with MD before discharge. Patient is consuming approximately 50-75% of meals at this time.    Labs: BMP Latest Ref Rng & Units 01/17/2018 01/16/2018 01/15/2018  Glucose 70 - 99 mg/dL 287(G) 811(X) 726(O)  BUN 6 - 20 mg/dL 03(T) 59(R) 41(U)  Creatinine 0.44 - 1.00 mg/dL 3.84(T) 3.64(W) 8.03(O)  BUN/Creat Ratio 9 - 23 - - -  Sodium 135 - 145 mmol/L 141 141 141  Potassium 3.5 - 5.1 mmol/L 3.5 3.6 4.1  Chloride 98 - 111 mmol/L 104 105 108  CO2 22 - 32 mmol/L 30 26 24   Calcium 8.9  - 10.3 mg/dL 1.2(Y) 4.8(G) 8.3(L)     No further nutrition interventions warranted at this time. RD contact information provided. If additional nutrition issues arise, please re-consult RD.    Royann Shivers MS,RD,CSG,LDN Office: [phone] Pager: [phone]

## 2018-01-17 NOTE — [place] FL2 (Signed)
[place] MEDICAID FL2 LEVEL OF CARE SCREENING TOOL     IDENTIFICATION  Patient Name: Katherine [name] Birthdate: 1960-07-25 Sex: female Admission Date (Current Location): 01/12/2018  [facility] [place]   Facility and Address:  [facility],  [address], [place] [id number]      Provider Number: [id number]  Attending Physician Name and Address:  Erick BlinksMemon, Jehanzeb, MD  Relative Name and Phone Number:       Current Level of Care: Hospital Recommended Level of Care:   Prior Approval Number:    Date Approved/Denied:   PASRR Number: [id number] [id number]  Discharge Plan: SNF    Current Diagnoses: Patient Active Problem List   Diagnosis Date Noted  . Morbid obesity due to excess calories (HCC) 09/24/2015  . Personal history of noncompliance with medical treatment, presenting hazards to health 02/19/2015  . Acute on chronic combined systolic and diastolic CHF (congestive heart failure) (HCC) 11/17/2012  . Non-ischemic cardiomyopathy (HCC) 10/03/2012  . AICD (automatic cardioverter/defibrillator) present 10/03/2012  . HTN (hypertension) 10/03/2012  . Diabetes mellitus with stage 3 chronic kidney disease (HCC) 10/03/2012  . Fibromyalgia 10/03/2012  . Dyslipidemia 10/03/2012  . Lupus (HCC) 10/03/2012  . Stroke (HCC) 10/03/2012    Orientation RESPIRATION BLADDER Height & Weight     Self, Time, Situation, Place  Normal Continent Weight: 135.2 kg Height:  6' (182.9 cm)  BEHAVIORAL SYMPTOMS/MOOD NEUROLOGICAL BOWEL NUTRITION STATUS  (none) (none) Continent Diet(2 Gram Sodium)  AMBULATORY STATUS COMMUNICATION OF NEEDS Skin   Extensive Assist Verbally                         Personal Care Assistance Level of Assistance  Bathing, Feeding, Dressing Bathing Assistance: Limited assistance Feeding assistance: Independent Dressing Assistance: Limited assistance     Functional Limitations Info  Sight, Hearing, Speech Sight Info:  Adequate Hearing Info: Adequate Speech Info: Adequate    SPECIAL CARE FACTORS FREQUENCY  PT (By licensed PT)     PT Frequency: 3x/W              Contractures Contractures Info: Not present    Additional Factors Info  Code Status, Allergies Code Status Info: Full Allergies Info: Aspirin, Shellfish, Penicillins           Current Medications (01/17/2018):  This is the current hospital active medication list Current Facility-Administered Medications  Medication Dose Route Frequency Provider Last Rate Last Dose  . 0.9 %  sodium chloride infusion  250 mL Intravenous PRN Erick BlinksMemon, Jehanzeb, MD      . acetaminophen (TYLENOL) tablet 650 mg  650 mg Oral Q4H PRN Erick BlinksMemon, Jehanzeb, MD      . alum & mag hydroxide-simeth (MAALOX/MYLANTA) 200-200-20 MG/5ML suspension 30 mL  30 mL Oral Q6H PRN Erick BlinksMemon, Jehanzeb, MD   30 mL at 01/14/18 1147  . clopidogrel (PLAVIX) tablet 75 mg  75 mg Oral Daily Erick BlinksMemon, Jehanzeb, MD   75 mg at 01/17/18 0904  . furosemide (LASIX) 250 mg in dextrose 5 % 250 mL (1 mg/mL) infusion  4 mg/hr Intravenous Continuous Jonelle SidleMcDowell, Samuel G, MD 4 mL/hr at 01/17/18 0541 4 mg/hr at 01/17/18 0541  . heparin injection 5,000 Units  5,000 Units Subcutaneous Q8H Erick BlinksMemon, Jehanzeb, MD   5,000 Units at 01/17/18 1445  . hydrALAZINE (APRESOLINE) tablet 20 mg  20 mg Oral Q8H Jonelle SidleMcDowell, Samuel G, MD   20 mg at 01/17/18 0533  . HYDROcodone-acetaminophen (NORCO/VICODIN) 5-325 MG per tablet 1 tablet  1 tablet Oral Q6H PRN Erick Blinks, MD   1 tablet at 01/17/18 0910  . insulin aspart (novoLOG) injection 0-15 Units  0-15 Units Subcutaneous TID WC Erick Blinks, MD   3 Units at 01/17/18 1154  . insulin aspart (novoLOG) injection 0-5 Units  0-5 Units Subcutaneous QHS Erick Blinks, MD      . isosorbide mononitrate (IMDUR) 24 hr tablet 30 mg  30 mg Oral Daily Jonelle Sidle, MD   30 mg at 01/17/18 0905  . loratadine (CLARITIN) tablet 10 mg  10 mg Oral Daily Erick Blinks, MD   10 mg at  01/17/18 0906  . magnesium hydroxide (MILK OF MAGNESIA) suspension 15 mL  15 mL Oral Daily PRN Erick Blinks, MD   15 mL at 01/14/18 0500  . metoprolol succinate (TOPROL-XL) 24 hr tablet 50 mg  50 mg Oral Daily Jonelle Sidle, MD   50 mg at 01/17/18 6226  . mometasone-formoterol (DULERA) 200-5 MCG/ACT inhaler 2 puff  2 puff Inhalation BID Erick Blinks, MD   2 puff at 01/17/18 0740  . polyethylene glycol (MIRALAX / GLYCOLAX) packet 17 g  17 g Oral Daily Erick Blinks, MD   17 g at 01/17/18 0906  . potassium chloride SA (K-DUR,KLOR-CON) CR tablet 20 mEq  20 mEq Oral Daily Dunn, Dayna N, PA-C   20 mEq at 01/17/18 0910  . promethazine (PHENERGAN) injection 12.5 mg  12.5 mg Intravenous Q6H PRN Erick Blinks, MD   12.5 mg at 01/14/18 1147  . simvastatin (ZOCOR) tablet 20 mg  20 mg Oral QHS Erick Blinks, MD   20 mg at 01/16/18 2321  . sodium chloride flush (NS) 0.9 % injection 10-40 mL  10-40 mL Intracatheter Q12H Erick Blinks, MD   10 mL at 01/16/18 2323  . sodium chloride flush (NS) 0.9 % injection 10-40 mL  10-40 mL Intracatheter PRN Erick Blinks, MD      . sodium chloride flush (NS) 0.9 % injection 3 mL  3 mL Intravenous Q12H Erick Blinks, MD   3 mL at 01/17/18 1156  . sodium chloride flush (NS) 0.9 % injection 3 mL  3 mL Intravenous PRN Erick Blinks, MD      . venlafaxine XR (EFFEXOR-XR) 24 hr capsule 150 mg  150 mg Oral Daily Erick Blinks, MD   150 mg at 01/17/18 3335     Discharge Medications: Please see discharge summary for a list of discharge medications.  Relevant Imaging Results:  Relevant Lab Results:   Additional Information    Ida Rogue, LCSW

## 2018-01-17 NOTE — Care Management Note (Addendum)
Case Management Note  Patient Details  Name: Katherine [name] MRN: [id number] Date of Birth: May 18, 1960  Subjective/Objective:      Admitted with CHF. Pt from home, ind pta. Has been recommended for SNF and refuses. Pt says she will have multiple support persons at home. She has had HH in the past and would like that again, used [facility]. CM provided list of provider options as well. Pt feels she will need BSC, WC and cane to function with moderate assistance at home. She was provided list of DME providers and has no preference of provider. Pt uses [facility] in [place], feels she will need a "bubble packaging system" at DC which she did not do before.             Action/Plan: Still on lasix gtt, not yet ready for DC. CM has given DME referral to Therisa Doyne, [facility] rep. CM has called [facility] and provided demographics. HH orders will need to be faxed once available. CM contacted [employer] and they do provide by bubble packaging services. CM will cont to follow.   Expected Discharge Date:    01/21/18              Expected Discharge Plan:  Home w Home Health Services  In-House Referral:  Clinical Social Work  Discharge planning Services  CM Consult  Post Acute Care Choice:  Durable Medical Equipment, Home Health Choice offered to:  Patient  DME Arranged:  Gilmer Mor, Government social research officer, 3-N-1 DME Agency:  [facility]  HH Arranged:  RN, PT HH Agency:  [facility]  Status of Service:  In process, will continue to follow  If discussed at Long Length of Stay Meetings, dates discussed:    Additional Comments:  Malcolm Metro, RN 01/17/2018, 1:50 PM

## 2018-01-17 NOTE — Clinical Social Work Placement (Signed)
   CLINICAL SOCIAL WORK PLACEMENT  NOTE  Date:  01/17/2018  Patient Details  Name: Katherine [name] MRN: [id number] Date of Birth: 19-Apr-1960  Clinical Social Work is seeking post-discharge placement for this patient at the Skilled  Nursing Facility level of care (*CSW will initial, date and re-position this form in  chart as items are completed):  Yes   Patient/family provided with [facility] Clinical Social Work Department's list of facilities offering this level of care within the geographic area requested by the patient (or if unable, by the patient's family).  Yes   Patient/family informed of their freedom to choose among providers that offer the needed level of care, that participate in Medicare, Medicaid or managed care program needed by the patient, have an available bed and are willing to accept the patient.  Yes   Patient/family informed of [facility]'s ownership interest in [facility] and [facility], as well as of the fact that they are under no obligation to receive care at these facilities.  PASRR submitted to EDS on 01/17/18     PASRR number received on 01/17/18     Existing PASRR number confirmed on       FL2 transmitted to all facilities in geographic area requested by pt/family on 01/17/18     FL2 transmitted to all facilities within larger geographic area on       Patient informed that his/her managed care company has contracts with or will negotiate with certain facilities, including the following:            Patient/family informed of bed offers received.  Patient chooses bed at       Physician recommends and patient chooses bed at      Patient to be transferred to   on  .  Patient to be transferred to facility by       Patient family notified on   of transfer.  Name of family member notified:        PHYSICIAN       Additional Comment:  Pt is ambivalent about referral to SNF; states she wants to return home if possible even though she  acknowledges having limited in home support.  Reluctantly agreed to allowing CSW to send out bed reauest in the event that she does not feel well enough to return home when Dr d/c's her from hospital.  Pt has MCD.  Explained she would need to commit for 30 days.  She verbalized understanding.  Agreed to referrals to [facility] and [facility].   _______________________________________________ Ida Rogue, LCSW 01/17/2018, 4:11 PM

## 2018-01-17 NOTE — Progress Notes (Signed)
Physical Therapy Treatment Patient Details Name: Katherine [name] MRN: [id number] DOB: 1960-06-23 Today's Date: 01/17/2018    History of Present Illness  Katherine [name] is a 57 y.o. female with medical history significant of chronic systolic congestive heart failure ejection fraction of 30 to 35% on echo from 2012, lupus, chronic kidney disease stage III, hypertension diabetes, presents to the hospital with complaints of increasing shortness of breath.  Patient reports that she has been short of breath since October of this year.  Her symptoms of gotten worse over the past week.  She is noticed worsening lower extremity edema.  She has 4 pillow orthopnea.  She denies any chest discomfort.  She does have cough and has been feeling feverish lately.  She did not have any dysuria.  She has had some nausea and vomiting.  No diarrhea.  She has been feeling increasingly weak.  She has difficulty walking due to shortness of breath as well as heaviness in her legs.    PT Comments    Patient continues to be quite deconditioned and limited in her mobility including bed mobility and transfers. Ambulation unsafe with RW at this time. Patient c/o pain in bilateral feet and hips limiting her ability to weight bear and shift weight to move feet for stand pivot transfer and ambulation.  Patient would continue to benefit from skilled physical therapy in current environment and next venue to continue return to prior function and increase strength, endurance, balance, coordination, and functional mobility and gait skills.     Follow Up Recommendations  SNF;Supervision/Assistance - 24 hour     Equipment Recommendations       Recommendations for Other Services       Precautions / Restrictions Precautions Precautions: Fall Restrictions Weight Bearing Restrictions: No    Mobility  Bed Mobility Overal bed mobility: Needs Assistance Bed Mobility: Supine to Sit;Sit to Supine     Supine to sit: Min  assist     General bed mobility comments: PT able to scoot to [facility] while sitting I   Transfers Overall transfer level: Needs assistance(unable to safely at this time ) Equipment used: Rolling walker (2 wheeled) Transfers: Sit to/from [employer] Sit to Stand: Max assist Stand pivot transfers: Max assist          Ambulation/Gait                 Stairs             Wheelchair Mobility    Modified Rankin (Stroke Patients Only)       Balance Overall balance assessment: Needs assistance Sitting-balance support: No upper extremity supported;Feet supported Sitting balance-Leahy Scale: Good     Standing balance support: Bilateral upper extremity supported;During functional activity Standing balance-Leahy Scale: Poor Standing balance comment: poor with RW as well                            Cognition Arousal/Alertness: Awake/alert Behavior During Therapy: WFL for tasks assessed/performed Overall Cognitive Status: Within Functional Limits for tasks assessed                                        Exercises General Exercises - Lower Extremity Long Arc Quad: AROM;Strengthening;Both;10 reps;Seated Hip Flexion/Marching: AROM;Strengthening;10 reps;Seated Toe Raises: AROM;Strengthening;Both;10 reps;Seated Heel Raises: AROM;Strengthening;Both;10 reps;Seated    General Comments  Pertinent Vitals/Pain      Home Living                      Prior Function            PT Goals (current goals can now be found in the care plan section) Progress towards PT goals: Progressing toward goals    Frequency    Min 3X/week      PT Plan Current plan remains appropriate    Co-evaluation              AM-PAC PT "6 Clicks" Mobility   Outcome Measure  Help needed turning from your back to your side while in a flat bed without using bedrails?: A Little Help needed moving from lying on your back to  sitting on the side of a flat bed without using bedrails?: A Little Help needed moving to and from a bed to a chair (including a wheelchair)?: A Lot Help needed standing up from a chair using your arms (e.g., wheelchair or bedside chair)?: A Lot Help needed to walk in hospital room?: Total Help needed climbing 3-5 steps with a railing? : Total 6 Click Score: 12    End of Session Equipment Utilized During Treatment: Gait belt Activity Tolerance: Patient limited by fatigue;Patient limited by pain Patient left: in bed Nurse Communication: Mobility status PT Visit Diagnosis: Unsteadiness on feet (R26.81);Other abnormalities of gait and mobility (R26.89);Muscle weakness (generalized) (M62.81);History of falling (Z91.81);Difficulty in walking, not elsewhere classified (R26.2)     Time: 1245-1315 PT Time Calculation (min) (ACUTE ONLY): 30 min  Charges:  $Therapeutic Exercise: 8-22 mins $Therapeutic Activity: 8-22 mins                     Katina DungBarbara D. Hartnett-Rands, MS, PT [facility] [place] [phone] 01/17/2018, 1:26 PM

## 2018-01-17 NOTE — Progress Notes (Signed)
PROGRESS NOTE    Katherine [name]  QIO:[id number] DOB: May 05, 1960 DOA: 01/12/2018 PCP: Alvina FilbertHunter, Denise, MD    Brief Narrative:  57 year old female with a history of cardiomyopathy, low ejection fraction, lupus, chronic kidney disease stage III, hypertension and diabetes who presented to the hospital with worsening shortness of breath.  She was found to be in decompensated CHF and admitted for IV diuresis.   Assessment & Plan:   Active Problems:   Non-ischemic cardiomyopathy (HCC)   AICD (automatic cardioverter/defibrillator) present   Diabetes mellitus with stage 3 chronic kidney disease (HCC)   Dyslipidemia   Lupus (HCC)   Acute on chronic combined systolic and diastolic CHF (congestive heart failure) (HCC)   Morbid obesity due to excess calories (HCC)   1. Acute on chronic combined CHF.  Echocardiogram performed shows ejection fraction of 20 to 25%. Initially placed on IV Lasix twice daily which resulted in rising creatinine.  Losartan and Aldactone currently on hold due to renal dysfunction.  Continue on beta-blockers.  Seen by cardiology and IV Lasix twice daily changed to Lasix infusion.  She continues to have good urine output on Lasix infusion and renal function continues to improve with diuresis.  She still has evidence of volume overload.  Continue to monitor renal function and urine output.  Daily co-ox from  PICC line has been ordered 2. Lupus.  Continue on Plaquenil.  No evidence of flares at this time. 3. Chronic kidney disease stage III.  Creatinine had initially trended up with diuresis.  Now on a Lasix infusion.  Renal function appears to be stabilizing.  Continue to monitor renal function and urine output. 4. Diabetes.  Blood sugars have been stable.  NPH currently on hold.  Continue to monitor on sliding scale. 5. Hyperlipidemia.  Continue statin 6. Hypertension.  Clonidine and losartan currently on hold.  Continue on beta-blockers. 7. AICD.  History of ventricular  tachycardia.  Was recently started on amiodarone by her electrophysiologist, but stopped taking this 1 week ago due to GI upset.   DVT prophylaxis: Heparin Code Status: Full code Family Communication: No family present Disposition Plan: Possible skilled nursing facility on discharge when she is adequately diuresed, if patient is agreeable   Consultants:   Cardiology  Procedures:  Echo:- Left ventricle: The cavity size was moderately dilated. Wall   thickness was increased in a pattern of mild LVH. Systolic   function was severely reduced. The estimated ejection fraction   was in the range of 20% to 25%. Diffuse hypokinesis. Features are   consistent with a pseudonormal left ventricular filling pattern,   with concomitant abnormal relaxation and increased filling   pressure (grade 2 diastolic dysfunction). - Ventricular septum: Septal motion showed abnormal function and   dyssynergy. - Aortic valve: Mildly calcified annulus. Trileaflet; mildly   calcified leaflets. There was mild regurgitation. - Mitral valve: Mildly calcified annulus. There was mild   regurgitation. - Left atrium: The atrium was mildly dilated. - Right ventricle: The cavity size was mildly dilated. Pacer wire   or catheter noted in right ventricle. Systolic function was   mildly reduced. - Right atrium: The atrium was mildly dilated. - Tricuspid valve: There was mild regurgitation. - Pulmonary arteries: Systolic pressure was moderately increased.   PA peak pressure: 46 mm Hg (S). - Pericardium, extracardiac: A trivial pericardial effusion was    identified.  Antimicrobials:       Subjective: Continues to have pain in her legs.  Difficulty standing.  Feels it  is related to arthritis.  Shortness of breath is improving.  Continues to have good urine output on Lasix infusion  Objective: Vitals:   01/17/18 0500 01/17/18 0532 01/17/18 0740 01/17/18 1345  BP:  107/75  99/70  Pulse:  91  90  Resp:  18  16    Temp:  97.7 F (36.5 C)  98.6 F (37 C)  TempSrc:  Oral  Oral  SpO2:  99% 97% 96%  Weight: 135.2 kg     Height:        Intake/Output Summary (Last 24 hours) at 01/17/2018 1636 Last data filed at 01/17/2018 1517 Gross per 24 hour  Intake 380.34 ml  Output 3150 ml  Net -2769.66 ml   Filed Weights   01/15/18 0604 01/16/18 0500 01/17/18 0500  Weight: (!) 139.9 kg (!) 137.2 kg 135.2 kg    Examination:  General exam: Alert, awake, oriented x 3 Respiratory system: Clear to auscultation. Respiratory effort normal. Cardiovascular system:RRR. No murmurs, rubs, gallops. Gastrointestinal system: Abdomen is nondistended, soft and nontender. No organomegaly or masses felt. Normal bowel sounds heard. Central nervous system: Alert and oriented. No focal neurological deficits. Extremities: 2+ edema left lower extremity, 1+ edema in the right lower extremity Skin: No rashes, lesions or ulcers Psychiatry: Judgement and insight appear normal. Mood & affect appropriate.     Data Reviewed: I have personally reviewed following labs and imaging studies  CBC: Recent Labs  Lab 01/12/18 0410  WBC 5.4  NEUTROABS 2.6  HGB 12.9  HCT 40.3  MCV 91.8  PLT 240   Basic Metabolic Panel: Recent Labs  Lab 01/13/18 0942 01/14/18 1257 01/15/18 0803 01/16/18 0600 01/17/18 0610  NA 141 141 141 141 141  K 4.7 4.6 4.1 3.6 3.5  CL 108 107 108 105 104  CO2 19* 22 24 26 30   GLUCOSE 110* 153* 185* 181* 153*  BUN 31* 40* 39* 34* 26*  CREATININE 2.24* 2.49* 2.20* 1.94* 1.45*  CALCIUM 8.8* 8.8* 8.3* 8.5* 8.5*   GFR: Estimated Creatinine Clearance: 66.2 mL/min (A) (by C-G formula based on SCr of 1.45 mg/dL (H)). Liver Function Tests: Recent Labs  Lab 01/12/18 0410  AST 52*  ALT 38  ALKPHOS 127*  BILITOT 3.4*  PROT 7.0  ALBUMIN 2.9*   No results for input(s): LIPASE, AMYLASE in the last 168 hours. No results for input(s): AMMONIA in the last 168 hours. Coagulation Profile: No results for  input(s): INR, PROTIME in the last 168 hours. Cardiac Enzymes: Recent Labs  Lab 01/12/18 0410  TROPONINI <0.03   BNP (last 3 results) No results for input(s): PROBNP in the last 8760 hours. HbA1C: No results for input(s): HGBA1C in the last 72 hours. CBG: Recent Labs  Lab 01/16/18 1123 01/16/18 1649 01/16/18 2120 01/17/18 0734 01/17/18 1058  GLUCAP 181* 134* 126* 144* 173*   Lipid Profile: No results for input(s): CHOL, HDL, LDLCALC, TRIG, CHOLHDL, LDLDIRECT in the last 72 hours. Thyroid Function Tests: No results for input(s): TSH, T4TOTAL, FREET4, T3FREE, THYROIDAB in the last 72 hours. Anemia Panel: No results for input(s): VITAMINB12, FOLATE, FERRITIN, TIBC, IRON, RETICCTPCT in the last 72 hours. Sepsis Labs: No results for input(s): PROCALCITON, LATICACIDVEN in the last 168 hours.  No results found for this or any previous visit (from the past 240 hour(s)).       Radiology Studies: US Venous Img Lower Unilateral Left  Result Date: 01/17/2018 CLINICAL DATA:  57 year old with left leg swelling. EXAM: LEFT LOWER EXTREMITY VENOUS DOPPLER ULTRASOUND  TECHNIQUE: Gray-scale sonography with graded compression, as well as color Doppler and duplex ultrasound were performed to evaluate the lower extremity deep venous systems from the level of the common femoral vein and including the common femoral, femoral, profunda femoral, popliteal and calf veins including the posterior tibial, peroneal and gastrocnemius veins when visible. The superficial great saphenous vein was also interrogated. Spectral Doppler was utilized to evaluate flow at rest and with distal augmentation maneuvers in the common femoral, femoral and popliteal veins. COMPARISON:  None. FINDINGS: Contralateral Common Femoral Vein: Respiratory phasicity is normal and symmetric with the symptomatic side. No evidence of thrombus. Normal compressibility. Common Femoral Vein: No evidence of thrombus. Normal compressibility,  respiratory phasicity and response to augmentation. Saphenofemoral Junction: No evidence of thrombus. Normal compressibility and flow on color Doppler imaging. Profunda Femoral Vein: No evidence of thrombus. Normal compressibility and flow on color Doppler imaging. Femoral Vein: No evidence of thrombus. Normal compressibility, respiratory phasicity and response to augmentation. Popliteal Vein: No evidence of thrombus. Normal compressibility, respiratory phasicity and response to augmentation. Calf Veins: Visualized left deep calf veins are patent without thrombus. Limited evaluation. Other Findings:  Subcutaneous edema. IMPRESSION: Negative for deep venous thrombosis in left lower extremity. Limited evaluation of the deep calf veins due to the subcutaneous edema and body habitus. Electronically Signed   By: Richarda Overlie M.D.   On: 01/17/2018 10:13        Scheduled Meds: . clopidogrel  75 mg Oral Daily  . heparin  5,000 Units Subcutaneous Q8H  . hydrALAZINE  20 mg Oral Q8H  . insulin aspart  0-15 Units Subcutaneous TID WC  . insulin aspart  0-5 Units Subcutaneous QHS  . isosorbide mononitrate  30 mg Oral Daily  . loratadine  10 mg Oral Daily  . metoprolol succinate  50 mg Oral Daily  . mometasone-formoterol  2 puff Inhalation BID  . polyethylene glycol  17 g Oral Daily  . potassium chloride  20 mEq Oral Daily  . simvastatin  20 mg Oral QHS  . sodium chloride flush  10-40 mL Intracatheter Q12H  . sodium chloride flush  3 mL Intravenous Q12H  . venlafaxine XR  150 mg Oral Daily   Continuous Infusions: . sodium chloride    . furosemide (LASIX) infusion 4 mg/hr (01/17/18 0541)     LOS: 5 days    Time spent:    Erick Blinks, MD [facility] Pager [phone]  If 7PM-7AM, please contact night-coverage [email or web address] Password [facility] 01/17/2018, 4:36 PM

## 2018-01-17 NOTE — Evaluation (Deleted)
Physical Therapy Evaluation Patient Details Name: Katherine [name] MRN: [id number] DOB: [birth year] Today's Date: 01/17/2018   History of Present Illness   Katherine [name] is a 57 y.o. female with medical history significant of chronic systolic congestive heart failure ejection fraction of 30 to 35% on echo from 2012, lupus, chronic kidney disease stage III, hypertension diabetes, presents to the hospital with complaints of increasing shortness of breath.  Patient reports that she has been short of breath since October of this year.  Her symptoms of gotten worse over the past week.  She is noticed worsening lower extremity edema.  She has 4 pillow orthopnea.  She denies any chest discomfort.  She does have cough and has been feeling feverish lately.  She did not have any dysuria.  She has had some nausea and vomiting.  No diarrhea.  She has been feeling increasingly weak.  She has difficulty walking due to shortness of breath as well as heaviness in her legs.    Clinical Impression  Patient continues to be quite deconditioned and limited in her mobility including bed mobility and transfers. Ambulation unsafe with RW at this time. Patient c/o pain in bilateral feet and hips limiting her ability to weight bear and shift weight to move feet for stand pivot transfer and ambulation.  Patient would continue to benefit from skilled physical therapy in current environment and next venue to continue return to prior function and increase strength, endurance, balance, coordination, and functional mobility and gait skills.      Follow Up Recommendations SNF;Supervision/Assistance - 24 hour    Equipment Recommendations       Recommendations for Other Services       Precautions / Restrictions Precautions Precautions: Fall Restrictions Weight Bearing Restrictions: No      Mobility  Bed Mobility Overal bed mobility: Needs Assistance Bed Mobility: Supine to Sit;Sit to Supine     Supine to sit:  Min assist     General bed mobility comments: PT able to scoot to [facility] while sitting I   Transfers Overall transfer level: Needs assistance(unable to safely at this time ) Equipment used: Rolling walker (2 wheeled) Transfers: Sit to/from [employer] Sit to Stand: Max assist Stand pivot transfers: Max assist          Ambulation/Gait                Stairs            Wheelchair Mobility    Modified Rankin (Stroke Patients Only)       Balance Overall balance assessment: Needs assistance Sitting-balance support: No upper extremity supported;Feet supported Sitting balance-Leahy Scale: Good     Standing balance support: Bilateral upper extremity supported;During functional activity Standing balance-Leahy Scale: Poor Standing balance comment: poor with RW as well                             Pertinent Vitals/Pain      Home Living                        Prior Function                 Hand Dominance        Extremity/Trunk Assessment                Communication      Cognition Arousal/Alertness: Awake/alert Behavior During Therapy: [facility] for tasks  assessed/performed Overall Cognitive Status: Within Functional Limits for tasks assessed                                        General Comments      Exercises General Exercises - Lower Extremity Long Arc Quad: AROM;Strengthening;Both;10 reps;Seated Hip Flexion/Marching: AROM;Strengthening;10 reps;Seated Toe Raises: AROM;Strengthening;Both;10 reps;Seated Heel Raises: AROM;Strengthening;Both;10 reps;Seated   Assessment/Plan    PT Assessment    PT Problem List         PT Treatment Interventions      PT Goals (Current goals can be found in the Care Plan section)       Frequency Min 3X/week   Barriers to discharge        Co-evaluation               AM-PAC PT "6 Clicks" Mobility  Outcome Measure Help needed turning  from your back to your side while in a flat bed without using bedrails?: A Little Help needed moving from lying on your back to sitting on the side of a flat bed without using bedrails?: A Little Help needed moving to and from a bed to a chair (including a wheelchair)?: A Lot Help needed standing up from a chair using your arms (e.g., wheelchair or bedside chair)?: A Lot Help needed to walk in hospital room?: Total Help needed climbing 3-5 steps with a railing? : Total 6 Click Score: 12    End of Session Equipment Utilized During Treatment: Gait belt Activity Tolerance: Patient limited by fatigue;Patient limited by pain Patient left: in bed Nurse Communication: Mobility status PT Visit Diagnosis: Unsteadiness on feet (R26.81);Other abnormalities of gait and mobility (R26.89);Muscle weakness (generalized) (M62.81);History of falling (Z91.81);Difficulty in walking, not elsewhere classified (R26.2)    Time: 1245-1315 PT Time Calculation (min) (ACUTE ONLY): 30 min   Charges:     PT Treatments $Therapeutic Exercise: 8-22 mins $Therapeutic Activity: 8-22 mins        Katina DungBarbara D. Hartnett-Rands, MS, PT [facility] PT [facility] [place] [phone] 01/17/2018, 1:23 PM

## 2018-01-18 LAB — COOXEMETRY PANEL
Carboxyhemoglobin: 3.4 % — ABNORMAL HIGH (ref 0.5–1.5)
Methemoglobin: 0.5 % (ref 0.0–1.5)
O2 Saturation: 89.1 %
TOTAL OXYGEN CONTENT: 14.6 mL/dL — AB (ref 15.0–23.0)
Total hemoglobin: 12.1 g/dL (ref 12.0–16.0)

## 2018-01-18 LAB — CBC
HCT: 38.2 % (ref 36.0–46.0)
HEMOGLOBIN: 12.3 g/dL (ref 12.0–15.0)
MCH: 30 pg (ref 26.0–34.0)
MCHC: 32.2 g/dL (ref 30.0–36.0)
MCV: 93.2 fL (ref 80.0–100.0)
Platelets: 271 10*3/uL (ref 150–400)
RBC: 4.1 MIL/uL (ref 3.87–5.11)
RDW: 21.9 % — ABNORMAL HIGH (ref 11.5–15.5)
WBC: 6.1 10*3/uL (ref 4.0–10.5)
nRBC: 1 % — ABNORMAL HIGH (ref 0.0–0.2)

## 2018-01-18 LAB — GLUCOSE, CAPILLARY
Glucose-Capillary: 121 mg/dL — ABNORMAL HIGH (ref 70–99)
Glucose-Capillary: 186 mg/dL — ABNORMAL HIGH (ref 70–99)
Glucose-Capillary: 120 mg/dL — ABNORMAL HIGH (ref 70–99)
Glucose-Capillary: 170 mg/dL — ABNORMAL HIGH (ref 70–99)

## 2018-01-18 LAB — BASIC METABOLIC PANEL
Anion gap: 10 (ref 5–15)
BUN: 21 mg/dL — AB (ref 6–20)
CO2: 29 mmol/L (ref 22–32)
Calcium: 8.4 mg/dL — ABNORMAL LOW (ref 8.9–10.3)
Chloride: 99 mmol/L (ref 98–111)
Creatinine, Ser: 1.38 mg/dL — ABNORMAL HIGH (ref 0.44–1.00)
GFR calc Af Amer: 49 mL/min — ABNORMAL LOW (ref >60)
GFR calc non Af Amer: 42 mL/min — ABNORMAL LOW (ref >60)
Glucose, Bld: 152 mg/dL — ABNORMAL HIGH (ref 70–99)
Potassium: 3.6 mmol/L (ref 3.5–5.1)
Sodium: 138 mmol/L (ref 135–145)

## 2018-01-18 NOTE — Care Management (Signed)
Pt working with both CSW and CM on alternative DC plans. SNF vs Home with HH. If pt discharges home she would like HH through [place] [facility]. DME has been ordered through [facility] but will not be delivered until final decision has been made about disposition. CM will cont to follow.

## 2018-01-18 NOTE — Progress Notes (Signed)
PROGRESS NOTE    Katherine [name]  WFU:[id number] DOB: 1960/04/05 DOA: 01/12/2018 PCP: Alvina Filbert, MD    Brief Narrative:  57 year old female with a history of cardiomyopathy, low ejection fraction, lupus, chronic kidney disease stage III, hypertension and diabetes who presented to the hospital with worsening shortness of breath.  She was found to be in decompensated CHF and admitted for IV diuresis.   Assessment & Plan:   Active Problems:   Non-ischemic cardiomyopathy (HCC)   AICD (automatic cardioverter/defibrillator) present   Diabetes mellitus with stage 3 chronic kidney disease (HCC)   Dyslipidemia   Lupus (HCC)   Acute on chronic combined systolic and diastolic CHF (congestive heart failure) (HCC)   Morbid obesity due to excess calories (HCC)   1. Acute on chronic combined CHF.  Echocardiogram performed shows ejection fraction of 20 to 25%. Initially placed on IV Lasix twice daily which resulted in rising creatinine.  Losartan and Aldactone currently on hold due to renal dysfunction.  Continue on beta-blockers.  Seen by cardiology and IV Lasix twice daily changed to Lasix infusion.  She continues to have good urine output on Lasix infusion and renal function continues to improve with diuresis.  She still has evidence of volume overload.  Continue to monitor renal function and urine output.   2. Lupus.  Continue on Plaquenil.  No evidence of flares at this time. 3. Chronic kidney disease stage III.  Creatinine had initially trended up with diuresis.  Now on a Lasix infusion.  Renal function appears to be stabilizing.  Continue to monitor renal function and urine output. 4. Diabetes.  Blood sugars have been stable.  NPH currently on hold.  Continue to monitor on sliding scale. 5. Hyperlipidemia.  Continue statin 6. Hypertension.  Clonidine and losartan currently on hold.  Continue on beta-blockers. 7. AICD.  History of ventricular tachycardia.  Was recently started on  amiodarone by her electrophysiologist, but stopped taking this 1 week ago due to GI upset.   DVT prophylaxis: Heparin Code Status: Full code Family Communication: No family present Disposition Plan: Possible skilled nursing facility on discharge when she is adequately diuresed, if patient is agreeable   Consultants:   Cardiology  Procedures:  Echo:- Left ventricle: The cavity size was moderately dilated. Wall   thickness was increased in a pattern of mild LVH. Systolic   function was severely reduced. The estimated ejection fraction   was in the range of 20% to 25%. Diffuse hypokinesis. Features are   consistent with a pseudonormal left ventricular filling pattern,   with concomitant abnormal relaxation and increased filling   pressure (grade 2 diastolic dysfunction). - Ventricular septum: Septal motion showed abnormal function and   dyssynergy. - Aortic valve: Mildly calcified annulus. Trileaflet; mildly   calcified leaflets. There was mild regurgitation. - Mitral valve: Mildly calcified annulus. There was mild   regurgitation. - Left atrium: The atrium was mildly dilated. - Right ventricle: The cavity size was mildly dilated. Pacer wire   or catheter noted in right ventricle. Systolic function was   mildly reduced. - Right atrium: The atrium was mildly dilated. - Tricuspid valve: There was mild regurgitation. - Pulmonary arteries: Systolic pressure was moderately increased.   PA peak pressure: 46 mm Hg (S). - Pericardium, extracardiac: A trivial pericardial effusion was    identified.  Antimicrobials:       Subjective: Feels that lower extremity edema is improving.  Not back to baseline yet.  Good urine output with Lasix infusion  Objective: Vitals:   01/17/18 2157 01/18/18 0600 01/18/18 0900 01/18/18 1338  BP: 129/77 (!) 134/103  123/82  Pulse: 89 64  86  Resp:  18  20  Temp: 98.8 F (37.1 C) 98.3 F (36.8 C)  98.1 F (36.7 C)  TempSrc: Oral Oral  Oral    SpO2: 98% 98% 96% 99%  Weight:  134.5 kg    Height:        Intake/Output Summary (Last 24 hours) at 01/18/2018 1847 Last data filed at 01/18/2018 1700 Gross per 24 hour  Intake 752 ml  Output 4050 ml  Net -3298 ml   Filed Weights   01/16/18 0500 01/17/18 0500 01/18/18 0600  Weight: (!) 137.2 kg 135.2 kg 134.5 kg    Examination:  General exam: Alert, awake, oriented x 3 Respiratory system: Clear to auscultation. Respiratory effort normal. Cardiovascular system:RRR. No murmurs, rubs, gallops. Gastrointestinal system: Abdomen is nondistended, soft and nontender. No organomegaly or masses felt. Normal bowel sounds heard. Central nervous system: Alert and oriented. No focal neurological deficits. Extremities: Bilateral lower extremity edema Skin: No rashes, lesions or ulcers Psychiatry: Judgement and insight appear normal. Mood & affect appropriate.   Data Reviewed: I have personally reviewed following labs and imaging studies  CBC: Recent Labs  Lab 01/12/18 0410 01/18/18 0514  WBC 5.4 6.1  NEUTROABS 2.6  --   HGB 12.9 12.3  HCT 40.3 38.2  MCV 91.8 93.2  PLT 240 271   Basic Metabolic Panel: Recent Labs  Lab 01/14/18 1257 01/15/18 0803 01/16/18 0600 01/17/18 0610 01/18/18 0514  NA 141 141 141 141 138  K 4.6 4.1 3.6 3.5 3.6  CL 107 108 105 104 99  CO2 22 24 26 30 29   GLUCOSE 153* 185* 181* 153* 152*  BUN 40* 39* 34* 26* 21*  CREATININE 2.49* 2.20* 1.94* 1.45* 1.38*  CALCIUM 8.8* 8.3* 8.5* 8.5* 8.4*   GFR: Estimated Creatinine Clearance: 69.4 mL/min (A) (by C-G formula based on SCr of 1.38 mg/dL (H)). Liver Function Tests: Recent Labs  Lab 01/12/18 0410  AST 52*  ALT 38  ALKPHOS 127*  BILITOT 3.4*  PROT 7.0  ALBUMIN 2.9*   No results for input(s): LIPASE, AMYLASE in the last 168 hours. No results for input(s): AMMONIA in the last 168 hours. Coagulation Profile: No results for input(s): INR, PROTIME in the last 168 hours. Cardiac Enzymes: Recent  Labs  Lab 01/12/18 0410  TROPONINI <0.03   BNP (last 3 results) No results for input(s): PROBNP in the last 8760 hours. HbA1C: No results for input(s): HGBA1C in the last 72 hours. CBG: Recent Labs  Lab 01/17/18 1636 01/17/18 2158 01/18/18 0822 01/18/18 1138 01/18/18 1641  GLUCAP 116* 160* 121* 186* 120*   Lipid Profile: No results for input(s): CHOL, HDL, LDLCALC, TRIG, CHOLHDL, LDLDIRECT in the last 72 hours. Thyroid Function Tests: No results for input(s): TSH, T4TOTAL, FREET4, T3FREE, THYROIDAB in the last 72 hours. Anemia Panel: No results for input(s): VITAMINB12, FOLATE, FERRITIN, TIBC, IRON, RETICCTPCT in the last 72 hours. Sepsis Labs: No results for input(s): PROCALCITON, LATICACIDVEN in the last 168 hours.  No results found for this or any previous visit (from the past 240 hour(s)).       Radiology Studies: US Venous Img Lower Unilateral Left  Result Date: 01/17/2018 CLINICAL DATA:  57 year old with left leg swelling. EXAM: LEFT LOWER EXTREMITY VENOUS DOPPLER ULTRASOUND TECHNIQUE: Gray-scale sonography with graded compression, as well as color Doppler and duplex ultrasound were performed to evaluate  the lower extremity deep venous systems from the level of the common femoral vein and including the common femoral, femoral, profunda femoral, popliteal and calf veins including the posterior tibial, peroneal and gastrocnemius veins when visible. The superficial great saphenous vein was also interrogated. Spectral Doppler was utilized to evaluate flow at rest and with distal augmentation maneuvers in the common femoral, femoral and popliteal veins. COMPARISON:  None. FINDINGS: Contralateral Common Femoral Vein: Respiratory phasicity is normal and symmetric with the symptomatic side. No evidence of thrombus. Normal compressibility. Common Femoral Vein: No evidence of thrombus. Normal compressibility, respiratory phasicity and response to augmentation. Saphenofemoral  Junction: No evidence of thrombus. Normal compressibility and flow on color Doppler imaging. Profunda Femoral Vein: No evidence of thrombus. Normal compressibility and flow on color Doppler imaging. Femoral Vein: No evidence of thrombus. Normal compressibility, respiratory phasicity and response to augmentation. Popliteal Vein: No evidence of thrombus. Normal compressibility, respiratory phasicity and response to augmentation. Calf Veins: Visualized left deep calf veins are patent without thrombus. Limited evaluation. Other Findings:  Subcutaneous edema. IMPRESSION: Negative for deep venous thrombosis in left lower extremity. Limited evaluation of the deep calf veins due to the subcutaneous edema and body habitus. Electronically Signed   By: Richarda OverlieAdam  Henn M.D.   On: 01/17/2018 10:13        Scheduled Meds: . clopidogrel  75 mg Oral Daily  . heparin  5,000 Units Subcutaneous Q8H  . hydrALAZINE  20 mg Oral Q8H  . insulin aspart  0-15 Units Subcutaneous TID WC  . insulin aspart  0-5 Units Subcutaneous QHS  . isosorbide mononitrate  30 mg Oral Daily  . loratadine  10 mg Oral Daily  . metoprolol succinate  50 mg Oral Daily  . mometasone-formoterol  2 puff Inhalation BID  . polyethylene glycol  17 g Oral Daily  . potassium chloride  20 mEq Oral Daily  . simvastatin  20 mg Oral QHS  . sodium chloride flush  10-40 mL Intracatheter Q12H  . sodium chloride flush  3 mL Intravenous Q12H  . venlafaxine XR  150 mg Oral Daily   Continuous Infusions: . sodium chloride    . furosemide (LASIX) infusion 4 mg/hr (01/17/18 0541)     LOS: 6 days    Time spent: 35mins    Erick BlinksJehanzeb Hasana Alcorta, MD [facility] Pager [phone]  If 7PM-7AM, please contact night-coverage [email or web address] Password [email or web address] 01/18/2018, 6:47 PM

## 2018-01-18 NOTE — Progress Notes (Signed)
Did not measure CVP because we do not have the ability to measure on this unit. Confirmed with Bree the charge nurse.

## 2018-01-18 NOTE — Progress Notes (Signed)
Physical Therapy Treatment Patient Details Name: Katherine [name] MRN: [id number] DOB: May 17, 1960 Today's Date: 01/18/2018    History of Present Illness  Katherine [name] is a 57 y.o. female with medical history significant of chronic systolic congestive heart failure ejection fraction of 30 to 35% on echo from 2012, lupus, chronic kidney disease stage III, hypertension diabetes, presents to the hospital with complaints of increasing shortness of breath.  Patient reports that she has been short of breath since October of this year.  Her symptoms of gotten worse over the past week.  She is noticed worsening lower extremity edema.  She has 4 pillow orthopnea.  She denies any chest discomfort.  She does have cough and has been feeling feverish lately.  She did not have any dysuria.  She has had some nausea and vomiting.  No diarrhea.  She has been feeling increasingly weak.  She has difficulty walking due to shortness of breath as well as heaviness in her legs.    PT Comments    Patient has difficulty with sit to stands due to BLE weakness and pain when weightbearing, demonstrates increased endurance/distance for taking a few side steps with RW and tolerated sitting up in chair after therapy.  Patient will benefit from continued physical therapy in hospital and recommended venue below to increase strength, balance, endurance for safe ADLs and gait.   Follow Up Recommendations  SNF;Supervision/Assistance - 24 hour;Supervision for mobility/OOB     Equipment Recommendations  None recommended by PT    Recommendations for Other Services       Precautions / Restrictions Precautions Precautions: Fall Restrictions Weight Bearing Restrictions: No    Mobility  Bed Mobility Overal bed mobility: Needs Assistance Bed Mobility: Supine to Sit     Supine to sit: Supervision;Min guard     General bed mobility comments: had to use bed rail with labored movement  Transfers Overall transfer  level: Needs assistance Equipment used: Rolling walker (2 wheeled) Transfers: Sit to/from UGI CorporationStand;Stand Pivot Transfers Sit to Stand: Mod assist;Max assist Stand pivot transfers: Mod assist       General transfer comment: had difficulty with sit to stands due to BLE weakness  Ambulation/Gait Ambulation/Gait assistance: Mod assist;Max assist Gait Distance (Feet): 5 Feet Assistive device: Rolling walker (2 wheeled) Gait Pattern/deviations: Decreased step length - right;Decreased step length - left;Decreased stride length Gait velocity: slow   General Gait Details: limited to 6-7 slow unsteady side steps, limited mostly due to BLE pain, poor standing balance   Stairs             Wheelchair Mobility    Modified Rankin (Stroke Patients Only)       Balance Overall balance assessment: Needs assistance Sitting-balance support: Feet supported;No upper extremity supported Sitting balance-Leahy Scale: Good     Standing balance support: Bilateral upper extremity supported;During functional activity Standing balance-Leahy Scale: Fair Standing balance comment: fair/poor with RW                            Cognition Arousal/Alertness: Awake/alert Behavior During Therapy: WFL for tasks assessed/performed Overall Cognitive Status: Within Functional Limits for tasks assessed                                        Exercises General Exercises - Lower Extremity Long Arc Quad: AROM;Strengthening;Both;10 reps;Seated Hip Flexion/Marching: AROM;Strengthening;10 reps;Seated  Toe Raises: AROM;Strengthening;Both;10 reps;Seated Heel Raises: AROM;Strengthening;Both;10 reps;Seated    General Comments        Pertinent Vitals/Pain Pain Assessment: Faces Pain Score: 5  Pain Location: BLE when standing Pain Descriptors / Indicators: Heaviness;Aching Pain Intervention(s): Limited activity within patient's tolerance;Monitored during session    Home Living                       Prior Function            PT Goals (current goals can now be found in the care plan section) Progress towards PT goals: Progressing toward goals    Frequency    Min 3X/week      PT Plan Current plan remains appropriate    Co-evaluation              AM-PAC PT "6 Clicks" Mobility   Outcome Measure  Help needed turning from your back to your side while in a flat bed without using bedrails?: None Help needed moving from lying on your back to sitting on the side of a flat bed without using bedrails?: A Little(had to use bed rail) Help needed moving to and from a bed to a chair (including a wheelchair)?: A Lot Help needed standing up from a chair using your arms (e.g., wheelchair or bedside chair)?: A Lot Help needed to walk in hospital room?: A Lot Help needed climbing 3-5 steps with a railing? : Total 6 Click Score: 14    End of Session Equipment Utilized During Treatment: Gait belt Activity Tolerance: Patient tolerated treatment well;Patient limited by fatigue;Patient limited by pain Patient left: in chair;with call bell/phone within reach Nurse Communication: Mobility status PT Visit Diagnosis: Unsteadiness on feet (R26.81);Other abnormalities of gait and mobility (R26.89);Muscle weakness (generalized) (M62.81);History of falling (Z91.81);Difficulty in walking, not elsewhere classified (R26.2)     Time: 5364-6803 PT Time Calculation (min) (ACUTE ONLY): 36 min  Charges:  $Therapeutic Exercise: 8-22 mins $Therapeutic Activity: 8-22 mins                     12:39 PM, 01/18/18 Ocie Bob, MPT Physical Therapist with [facility] [address] [phone] office [phone] mobile phone

## 2018-01-18 NOTE — Progress Notes (Addendum)
Progress Note  Patient Name: Katherine [name] Date of Encounter: 01/18/2018  Primary Cardiologist: [name] EP: Dr. Lewayne Bunting  Subjective   Reports significant improvement in her breathing. No chest pain or palpitations. Having frequent urine output.  Inpatient Medications    Scheduled Meds: . clopidogrel  75 mg Oral Daily  . heparin  5,000 Units Subcutaneous Q8H  . hydrALAZINE  20 mg Oral Q8H  . insulin aspart  0-15 Units Subcutaneous TID WC  . insulin aspart  0-5 Units Subcutaneous QHS  . isosorbide mononitrate  30 mg Oral Daily  . loratadine  10 mg Oral Daily  . metoprolol succinate  50 mg Oral Daily  . mometasone-formoterol  2 puff Inhalation BID  . polyethylene glycol  17 g Oral Daily  . potassium chloride  20 mEq Oral Daily  . simvastatin  20 mg Oral QHS  . sodium chloride flush  10-40 mL Intracatheter Q12H  . sodium chloride flush  3 mL Intravenous Q12H  . venlafaxine XR  150 mg Oral Daily   Continuous Infusions: . sodium chloride    . furosemide (LASIX) infusion 4 mg/hr (01/17/18 0541)   PRN Meds: sodium chloride, acetaminophen, alum & mag hydroxide-simeth, HYDROcodone-acetaminophen, magnesium hydroxide, promethazine, sodium chloride flush, sodium chloride flush   Vital Signs    Vitals:   01/17/18 2001 01/17/18 2157 01/18/18 0600 01/18/18 0900  BP:  129/77 (!) 134/103   Pulse:  89 64   Resp:   18   Temp:  98.8 F (37.1 C) 98.3 F (36.8 C)   TempSrc:  Oral Oral   SpO2: 95% 98% 98% 96%  Weight:   134.5 kg   Height:        Intake/Output Summary (Last 24 hours) at 01/18/2018 1107 Last data filed at 01/18/2018 0900 Gross per 24 hour  Intake 276.69 ml  Output 1800 ml  Net -1523.31 ml   Filed Weights   01/16/18 0500 01/17/18 0500 01/18/18 0600  Weight: (!) 137.2 kg 135.2 kg 134.5 kg    Telemetry    NSR, HR in 80's to 90's with occasional PVC's. 6 beats NSVT yesterday.  - Personally Reviewed  ECG    No new tracings.   Physical  Exam   General: Morbidly obese African American female appearing in no acute distress. Head: Normocephalic, atraumatic.  Neck: Supple without bruits, JVD difficult to assess secondary to body habitus. Lungs:  Resp regular and unlabored, decreased along bases bilaterally. Heart: RRR, S1, S2, no S3, S4, or murmur; no rub. Abdomen: Soft, non-tender, non-distended with normoactive bowel sounds. No hepatomegaly. No rebound/guarding. No obvious abdominal masses. Extremities: No clubbing, cyanosis, 1+ pitting edema bilaterally, more prominent along LLE. Distal pedal pulses are 2+ bilaterally. Neuro: Alert and oriented X 3. Moves all extremities spontaneously. Psych: Normal affect.  Labs    Chemistry Recent Labs  Lab 01/12/18 0410  01/16/18 0600 01/17/18 0610 01/18/18 0514  NA 137   < > 141 141 138  K 5.2*   < > 3.6 3.5 3.6  CL 108   < > 105 104 99  CO2 21*   < > 26 30 29   GLUCOSE 148*   < > 181* 153* 152*  BUN 22*   < > 34* 26* 21*  CREATININE 1.65*   < > 1.94* 1.45* 1.38*  CALCIUM 8.2*   < > 8.5* 8.5* 8.4*  PROT 7.0  --   --   --   --   ALBUMIN 2.9*  --   --   --   --  AST 52*  --   --   --   --   ALT 38  --   --   --   --   ALKPHOS 127*  --   --   --   --   BILITOT 3.4*  --   --   --   --   GFRNONAA 34*   < > 28* 40* 42*  GFRAA 40*   < > 33* 46* 49*  ANIONGAP 8   < > 10 7 10    < > = values in this interval not displayed.     Hematology Recent Labs  Lab 01/12/18 0410 01/18/18 0514  WBC 5.4 6.1  RBC 4.39 4.10  HGB 12.9 12.3  HCT 40.3 38.2  MCV 91.8 93.2  MCH 29.4 30.0  MCHC 32.0 32.2  RDW 22.5* 21.9*  PLT 240 271    Cardiac Enzymes Recent Labs  Lab 01/12/18 0410  TROPONINI <0.03   No results for input(s): TROPIPOC in the last 168 hours.   BNP Recent Labs  Lab 01/12/18 0410  BNP 2,922.0*     DDimer No results for input(s): DDIMER in the last 168 hours.   Radiology    [place] Venous Img Lower Unilateral Left  Result Date: 01/17/2018 CLINICAL DATA:   57 year old with left leg swelling. EXAM: LEFT LOWER EXTREMITY VENOUS DOPPLER ULTRASOUND TECHNIQUE: Gray-scale sonography with graded compression, as well as color Doppler and duplex ultrasound were performed to evaluate the lower extremity deep venous systems from the level of the common femoral vein and including the common femoral, femoral, profunda femoral, popliteal and calf veins including the posterior tibial, peroneal and gastrocnemius veins when visible. The superficial great saphenous vein was also interrogated. Spectral Doppler was utilized to evaluate flow at rest and with distal augmentation maneuvers in the common femoral, femoral and popliteal veins. COMPARISON:  None. FINDINGS: Contralateral Common Femoral Vein: Respiratory phasicity is normal and symmetric with the symptomatic side. No evidence of thrombus. Normal compressibility. Common Femoral Vein: No evidence of thrombus. Normal compressibility, respiratory phasicity and response to augmentation. Saphenofemoral Junction: No evidence of thrombus. Normal compressibility and flow on color Doppler imaging. Profunda Femoral Vein: No evidence of thrombus. Normal compressibility and flow on color Doppler imaging. Femoral Vein: No evidence of thrombus. Normal compressibility, respiratory phasicity and response to augmentation. Popliteal Vein: No evidence of thrombus. Normal compressibility, respiratory phasicity and response to augmentation. Calf Veins: Visualized left deep calf veins are patent without thrombus. Limited evaluation. Other Findings:  Subcutaneous edema. IMPRESSION: Negative for deep venous thrombosis in left lower extremity. Limited evaluation of the deep calf veins due to the subcutaneous edema and body habitus. Electronically Signed   By: Richarda OverlieAdam  Henn M.D.   On: 01/17/2018 10:13    Cardiac Studies   Echocardiogram: 01/13/2018 Study Conclusions  - Left ventricle: The cavity size was moderately dilated. Wall   thickness was  increased in a pattern of mild LVH. Systolic   function was severely reduced. The estimated ejection fraction   was in the range of 20% to 25%. Diffuse hypokinesis. Features are   consistent with a pseudonormal left ventricular filling pattern,   with concomitant abnormal relaxation and increased filling   pressure (grade 2 diastolic dysfunction). - Ventricular septum: Septal motion showed abnormal function and   dyssynergy. - Aortic valve: Mildly calcified annulus. Trileaflet; mildly   calcified leaflets. There was mild regurgitation. - Mitral valve: Mildly calcified annulus. There was mild   regurgitation. - Left atrium: The  atrium was mildly dilated. - Right ventricle: The cavity size was mildly dilated. Pacer wire   or catheter noted in right ventricle. Systolic function was   mildly reduced. - Right atrium: The atrium was mildly dilated. - Tricuspid valve: There was mild regurgitation. - Pulmonary arteries: Systolic pressure was moderately increased.   PA peak pressure: 46 mm Hg (S). - Pericardium, extracardiac: A trivial pericardial effusion was   identified.  Patient Profile     57 y.o. female w/ PMH of chronic combined CHF, NICM (normal cors 2012, cMRI 2012 without infiltrative disease), asthma, fibromyalgia, SLE, Stage 3 CKD, HTN, HLD, seizures, morbid obesity, Type 2 DM, stroke, ventricular tachycardia with ICD in place (recurrent VT in 08/2017 with low-dose Amio initiated but patient self-discontinued due to GI side-effects) who presented to  [facility] ED on 01/12/2018 for worsening dyspnea and lower extremity edema. Weight 291 lbs in 11/2017, elevated to 310 lbs at time of admission.   Assessment & Plan    1. Acute on chronic combined CHF/NICM  - currently on Lasix drip at 4mg /hr and a recorded net output of -7.1L this admission (-2.1L yesterday). Weight has declined from 310 lbs on admission to 296 lbs today. CVP unable to be obtained on telemetry floor. Co-ox 63.3 on  12/30, improved to 89 today.  - reports baseline weight in the 280's. Given her continued output and improving kidney function, would continue with Lasix drip for now. Continue Toprol-XL, Hydralazine, and Imdur. PTA Losartan and Spironolactone held given variable kidney function.   2. AKI with CKD stage III  - creatinine peaked at 2.49 on 12/27, improved to 1.38 this AM. Continue to follow.   3. H/o ventricular tachycardia/ICD in place  - s/p ICD placement in 2013 with gen-change in 12/2016. Previously on Amiodarone but self-discontinued due to GI issues. Was on 200mg  daily at that time. Would review with EP but consider restarting at a lower dose of 100mg  daily. She does have frequent PVC's and 6 beats NSVT yesterday. K+ 3.6 and will try to keep ~ 4.0. Will check Mg. Continue to follow on telemetry during admission.  4. Essential HTN  - BP has been well-controlled at 99/70 - 134/103 within the past 24 hours.  - remains on Hydralazine 20mg  TID, Imdur 30mg  daily, and Toprol-XL 50mg  daily. PTA Losartan and Spironolactone held given variable kidney function.   5. Morbid obesity - would benefit from a sleep study as an outpatient.    For questions or updates, please contact [facility] Please consult [email or web address] for contact info under Cardiology/STEMI.   Signed, Ellsworth Lennox , PA-C 11:07 AM 01/18/2018 Pager: [phone]  Patient seen and examined   I agree with findings as noted by B Strader above   Pt is diuresing  Appears more comfortable   Still with increased volume on exam Neck:  Full Cardiac RRR   No S3 Lungs are rel clear Abd is obese Ext with ++ edema  Given progress and improved renal funciton I would continue with IV lasix    Follow electrolytes     Dietrich Pates

## 2018-01-19 LAB — BASIC METABOLIC PANEL
Anion gap: 7 (ref 5–15)
BUN: 22 mg/dL — ABNORMAL HIGH (ref 6–20)
CALCIUM: 8.7 mg/dL — AB (ref 8.9–10.3)
CO2: 30 mmol/L (ref 22–32)
CREATININE: 1.42 mg/dL — AB (ref 0.44–1.00)
Chloride: 101 mmol/L (ref 98–111)
GFR calc Af Amer: 47 mL/min — ABNORMAL LOW (ref >60)
GFR, EST NON AFRICAN AMERICAN: 41 mL/min — AB (ref >60)
GLUCOSE: 188 mg/dL — AB (ref 70–99)
Potassium: 3.9 mmol/L (ref 3.5–5.1)
Sodium: 138 mmol/L (ref 135–145)

## 2018-01-19 LAB — GLUCOSE, CAPILLARY
GLUCOSE-CAPILLARY: 173 mg/dL — AB (ref 70–99)
Glucose-Capillary: 125 mg/dL — ABNORMAL HIGH (ref 70–99)
Glucose-Capillary: 177 mg/dL — ABNORMAL HIGH (ref 70–99)
Glucose-Capillary: 166 mg/dL — ABNORMAL HIGH (ref 70–99)

## 2018-01-19 LAB — MAGNESIUM: Magnesium: 1.8 mg/dL (ref 1.7–2.4)

## 2018-01-19 LAB — COOXEMETRY PANEL
Carboxyhemoglobin: 3.4 % — ABNORMAL HIGH (ref 0.5–1.5)
Methemoglobin: 2.2 % — ABNORMAL HIGH (ref 0.0–1.5)
O2 Saturation: 97.2 %
Total hemoglobin: 12.8 g/dL (ref 12.0–16.0)
Total oxygen content: 11 mL/dL — ABNORMAL LOW (ref 15.0–23.0)

## 2018-01-19 NOTE — Progress Notes (Signed)
PROGRESS NOTE    Katherine [name]  ZOX:[id number] DOB: May 12, 1960 DOA: 01/12/2018 PCP: Alvina FilbertHunter, Denise, MD   Brief Narrative:  58 year old female with a history of cardiomyopathy, low ejection fraction, lupus, chronic kidney disease stage III, hypertension and diabetes who presented to the hospital with worsening shortness of breath.  She was found to be in decompensated CHF and admitted for IV diuresis.  Assessment & Plan:   Active Problems:   Non-ischemic cardiomyopathy (HCC)   AICD (automatic cardioverter/defibrillator) present   Diabetes mellitus with stage 3 chronic kidney disease (HCC)   Dyslipidemia   Lupus (HCC)   Acute on chronic combined systolic and diastolic CHF (congestive heart failure) (HCC)   Morbid obesity due to excess calories (HCC)  1. Acute on chronic combined CHF.  Echocardiogram performed shows ejection fraction of 20 to 25%. Initially placed on IV Lasix twice daily which resulted in rising creatinine.  Losartan and Aldactone currently on hold due to renal dysfunction.  Continue on beta-blockers.  Seen by cardiology and IV Lasix twice daily changed to Lasix infusion.  She continues to have good urine output on Lasix infusion and renal function continues to improve with diuresis.  She still has evidence of volume overload.  Continue to monitor renal function and urine output.    She is -3.5 L yesterday and current weight is 294 pounds. 2. Lupus.  Continue on Plaquenil.  No evidence of flares at this time. 3. Chronic kidney disease stage III.  Creatinine had initially trended up with diuresis.  Now on a Lasix infusion.  Renal function appears to be stabilizing.  Continue to monitor renal function and urine output. 4. Diabetes.  Blood sugars have been stable.  NPH currently on hold.  Continue to monitor on sliding scale. 5. Hyperlipidemia.  Continue statin 6. Hypertension.  Clonidine and losartan currently on hold.  Continue on beta-blockers. 7. AICD.  History of  ventricular tachycardia.  Was recently started on amiodarone by her electrophysiologist, but stopped taking this 1 week ago due to GI upset.   DVT prophylaxis: Heparin Code Status: Full code Family Communication: None at bedside Disposition Plan: Continue IV diuresis with Lasix for several days until she approaches baseline weight and the 280-282 pound range.  Likely DC to SNF at that point.   Consultants:   Cardiology  Procedures:  Echo:- Left ventricle: The cavity size was moderately dilated. Wall thickness was increased in a pattern of mild LVH. Systolic function was severely reduced. The estimated ejection fraction was in the range of 20% to 25%. Diffuse hypokinesis. Features are consistent with a pseudonormal left ventricular filling pattern, with concomitant abnormal relaxation and increased filling pressure (grade 2 diastolic dysfunction). - Ventricular septum: Septal motion showed abnormal function and dyssynergy. - Aortic valve: Mildly calcified annulus. Trileaflet; mildly calcified leaflets. There was mild regurgitation. - Mitral valve: Mildly calcified annulus. There was mild regurgitation. - Left atrium: The atrium was mildly dilated. - Right ventricle: The cavity size was mildly dilated. Pacer wire or catheter noted in right ventricle. Systolic function was mildly reduced. - Right atrium: The atrium was mildly dilated. - Tricuspid valve: There was mild regurgitation. - Pulmonary arteries: Systolic pressure was moderately increased. PA peak pressure: 46 mm Hg (S). - Pericardium, extracardiac: A trivial pericardial effusion was  identified.   Antimicrobials:   None   Subjective: Patient seen and evaluated today with no new acute complaints or concerns. No acute concerns or events noted overnight.  She states that her edema is improving  and maintains a great urine output.  She is less short of breath.  Objective: Vitals:    01/18/18 1338 01/18/18 2011 01/18/18 2149 01/19/18 0451  BP: 123/82  113/81 97/85  Pulse: 86  90 86  Resp: 20  16 18   Temp: 98.1 F (36.7 C)  98.1 F (36.7 C) 97.7 F (36.5 C)  TempSrc: Oral  Oral Oral  SpO2: 99% 98% 95% 100%  Weight:    133.5 kg  Height:        Intake/Output Summary (Last 24 hours) at 01/19/2018 0813 Last data filed at 01/19/2018 0500 Gross per 24 hour  Intake 752 ml  Output 4250 ml  Net -3498 ml   Filed Weights   01/17/18 0500 01/18/18 0600 01/19/18 0451  Weight: 135.2 kg 134.5 kg 133.5 kg    Examination:  General exam: Appears calm and comfortable, obese Respiratory system: Clear to auscultation. Respiratory effort normal. Cardiovascular system: S1 & S2 heard, RRR. No JVD, murmurs, rubs, gallops or clicks. No pedal edema. Gastrointestinal system: Abdomen is nondistended, soft and nontender. No organomegaly or masses felt. Normal bowel sounds heard. Central nervous system: Alert and oriented. No focal neurological deficits. Extremities: Symmetric 5 x 5 power. Skin: No rashes, lesions or ulcers Psychiatry: Judgement and insight appear normal. Mood & affect appropriate.     Data Reviewed: I have personally reviewed following labs and imaging studies  CBC: Recent Labs  Lab 01/18/18 0514  WBC 6.1  HGB 12.3  HCT 38.2  MCV 93.2  PLT 271   Basic Metabolic Panel: Recent Labs  Lab 01/15/18 0803 01/16/18 0600 01/17/18 0610 01/18/18 0514 01/19/18 0451  NA 141 141 141 138 138  K 4.1 3.6 3.5 3.6 3.9  CL 108 105 104 99 101  CO2 24 26 30 29 30   GLUCOSE 185* 181* 153* 152* 188*  BUN 39* 34* 26* 21* 22*  CREATININE 2.20* 1.94* 1.45* 1.38* 1.42*  CALCIUM 8.3* 8.5* 8.5* 8.4* 8.7*  MG  --   --   --   --  1.8   GFR: Estimated Creatinine Clearance: 67.1 mL/min (A) (by C-G formula based on SCr of 1.42 mg/dL (H)). Liver Function Tests: No results for input(s): AST, ALT, ALKPHOS, BILITOT, PROT, ALBUMIN in the last 168 hours. No results for input(s):  LIPASE, AMYLASE in the last 168 hours. No results for input(s): AMMONIA in the last 168 hours. Coagulation Profile: No results for input(s): INR, PROTIME in the last 168 hours. Cardiac Enzymes: No results for input(s): CKTOTAL, CKMB, CKMBINDEX, TROPONINI in the last 168 hours. BNP (last 3 results) No results for input(s): PROBNP in the last 8760 hours. HbA1C: No results for input(s): HGBA1C in the last 72 hours. CBG: Recent Labs  Lab 01/17/18 2158 01/18/18 0822 01/18/18 1138 01/18/18 1641 01/18/18 2149  GLUCAP 160* 121* 186* 120* 170*   Lipid Profile: No results for input(s): CHOL, HDL, LDLCALC, TRIG, CHOLHDL, LDLDIRECT in the last 72 hours. Thyroid Function Tests: No results for input(s): TSH, T4TOTAL, FREET4, T3FREE, THYROIDAB in the last 72 hours. Anemia Panel: No results for input(s): VITAMINB12, FOLATE, FERRITIN, TIBC, IRON, RETICCTPCT in the last 72 hours. Sepsis Labs: No results for input(s): PROCALCITON, LATICACIDVEN in the last 168 hours.  No results found for this or any previous visit (from the past 240 hour(s)).       Radiology Studies: [place] Venous Img Lower Unilateral Left  Result Date: 01/17/2018 CLINICAL DATA:  58 year old with left leg swelling. EXAM: LEFT LOWER EXTREMITY VENOUS DOPPLER ULTRASOUND  TECHNIQUE: Gray-scale sonography with graded compression, as well as color Doppler and duplex ultrasound were performed to evaluate the lower extremity deep venous systems from the level of the common femoral vein and including the common femoral, femoral, profunda femoral, popliteal and calf veins including the posterior tibial, peroneal and gastrocnemius veins when visible. The superficial great saphenous vein was also interrogated. Spectral Doppler was utilized to evaluate flow at rest and with distal augmentation maneuvers in the common femoral, femoral and popliteal veins. COMPARISON:  None. FINDINGS: Contralateral Common Femoral Vein: Respiratory phasicity is  normal and symmetric with the symptomatic side. No evidence of thrombus. Normal compressibility. Common Femoral Vein: No evidence of thrombus. Normal compressibility, respiratory phasicity and response to augmentation. Saphenofemoral Junction: No evidence of thrombus. Normal compressibility and flow on color Doppler imaging. Profunda Femoral Vein: No evidence of thrombus. Normal compressibility and flow on color Doppler imaging. Femoral Vein: No evidence of thrombus. Normal compressibility, respiratory phasicity and response to augmentation. Popliteal Vein: No evidence of thrombus. Normal compressibility, respiratory phasicity and response to augmentation. Calf Veins: Visualized left deep calf veins are patent without thrombus. Limited evaluation. Other Findings:  Subcutaneous edema. IMPRESSION: Negative for deep venous thrombosis in left lower extremity. Limited evaluation of the deep calf veins due to the subcutaneous edema and body habitus. Electronically Signed   By: Richarda Overlie M.D.   On: 01/17/2018 10:13        Scheduled Meds: . clopidogrel  75 mg Oral Daily  . heparin  5,000 Units Subcutaneous Q8H  . hydrALAZINE  20 mg Oral Q8H  . insulin aspart  0-15 Units Subcutaneous TID WC  . insulin aspart  0-5 Units Subcutaneous QHS  . isosorbide mononitrate  30 mg Oral Daily  . loratadine  10 mg Oral Daily  . metoprolol succinate  50 mg Oral Daily  . mometasone-formoterol  2 puff Inhalation BID  . polyethylene glycol  17 g Oral Daily  . potassium chloride  20 mEq Oral Daily  . simvastatin  20 mg Oral QHS  . sodium chloride flush  10-40 mL Intracatheter Q12H  . sodium chloride flush  3 mL Intravenous Q12H  . venlafaxine XR  150 mg Oral Daily   Continuous Infusions: . sodium chloride    . furosemide (LASIX) infusion 4 mg/hr (01/17/18 0541)     LOS: 7 days    Time spent: 30 minutes    Sonny Anthes Hoover Brunette, DO [facility] Pager [phone]  If 7PM-7AM, please contact  night-coverage [email or web address] Password [facility] 01/19/2018, 8:13 AM

## 2018-01-19 NOTE — Progress Notes (Signed)
Cannot perform CVP on this unit.

## 2018-01-20 DIAGNOSIS — I10 Essential (primary) hypertension: Secondary | ICD-10-CM

## 2018-01-20 LAB — BASIC METABOLIC PANEL
Anion gap: 9 (ref 5–15)
BUN: 22 mg/dL — ABNORMAL HIGH (ref 6–20)
CO2: 27 mmol/L (ref 22–32)
CREATININE: 1.55 mg/dL — AB (ref 0.44–1.00)
Calcium: 8.7 mg/dL — ABNORMAL LOW (ref 8.9–10.3)
Chloride: 103 mmol/L (ref 98–111)
GFR calc Af Amer: 43 mL/min — ABNORMAL LOW (ref >60)
GFR calc non Af Amer: 37 mL/min — ABNORMAL LOW (ref >60)
Glucose, Bld: 176 mg/dL — ABNORMAL HIGH (ref 70–99)
Potassium: 3.8 mmol/L (ref 3.5–5.1)
SODIUM: 139 mmol/L (ref 135–145)

## 2018-01-20 LAB — COOXEMETRY PANEL
Carboxyhemoglobin: 4 % — ABNORMAL HIGH (ref 0.5–1.5)
Methemoglobin: 0.7 % (ref 0.0–1.5)
O2 Saturation: 92 %
TOTAL HEMOGLOBIN: 12.4 g/dL (ref 12.0–16.0)

## 2018-01-20 LAB — GLUCOSE, CAPILLARY
Glucose-Capillary: 150 mg/dL — ABNORMAL HIGH (ref 70–99)
Glucose-Capillary: 171 mg/dL — ABNORMAL HIGH (ref 70–99)
Glucose-Capillary: 107 mg/dL — ABNORMAL HIGH (ref 70–99)
Glucose-Capillary: 188 mg/dL — ABNORMAL HIGH (ref 70–99)

## 2018-01-20 MED ORDER — FUROSEMIDE 10 MG/ML IJ SOLN
INTRAMUSCULAR | Status: AC
  Filled 2018-01-20: qty 30

## 2018-01-20 MED ORDER — HYDROXYCHLOROQUINE SULFATE 200 MG PO TABS
200.0000 mg | ORAL_TABLET | Freq: Two times a day (BID) | ORAL | Status: DC
  Administered 2018-01-20 – 2018-01-24 (×9): 200 mg via ORAL
  Filled 2018-01-20 (×9): qty 1

## 2018-01-20 MED ORDER — TRIAMCINOLONE ACETONIDE 0.025 % EX CREA
TOPICAL_CREAM | Freq: Two times a day (BID) | CUTANEOUS | Status: DC
  Administered 2018-01-20 – 2018-01-24 (×8): via TOPICAL
  Filled 2018-01-20: qty 15

## 2018-01-20 NOTE — Progress Notes (Addendum)
Progress Note  Patient Name: Katherine [name] Date of Encounter: 01/20/2018  Primary Cardiologist: [name] EP: Dr. Lewayne Bunting  Subjective   Breathing is improving   NO CP   Inpatient Medications    Scheduled Meds: . clopidogrel  75 mg Oral Daily  . heparin  5,000 Units Subcutaneous Q8H  . hydrALAZINE  20 mg Oral Q8H  . hydroxychloroquine  200 mg Oral BID  . insulin aspart  0-15 Units Subcutaneous TID WC  . insulin aspart  0-5 Units Subcutaneous QHS  . isosorbide mononitrate  30 mg Oral Daily  . loratadine  10 mg Oral Daily  . metoprolol succinate  50 mg Oral Daily  . mometasone-formoterol  2 puff Inhalation BID  . polyethylene glycol  17 g Oral Daily  . potassium chloride  20 mEq Oral Daily  . simvastatin  20 mg Oral QHS  . sodium chloride flush  10-40 mL Intracatheter Q12H  . sodium chloride flush  3 mL Intravenous Q12H  . triamcinolone   Topical BID  . venlafaxine XR  150 mg Oral Daily   Continuous Infusions: . sodium chloride    . furosemide (LASIX) infusion 8 mg/hr (01/20/18 0817)   PRN Meds: sodium chloride, acetaminophen, alum & mag hydroxide-simeth, HYDROcodone-acetaminophen, magnesium hydroxide, promethazine, sodium chloride flush, sodium chloride flush   Vital Signs    Vitals:   01/20/18 0456 01/20/18 0458 01/20/18 0834 01/20/18 0942  BP: (!) 128/91   121/87  Pulse: 98   82  Resp: 20     Temp: 97.6 F (36.4 C)     TempSrc: Oral     SpO2: 94%  92%   Weight:  132.4 kg    Height:        Intake/Output Summary (Last 24 hours) at 01/20/2018 1110 Last data filed at 01/20/2018 0900 Gross per 24 hour  Intake 1027.29 ml  Output 1900 ml  Net -872.71 ml    Net INEg 10.8 L    Filed Weights   01/18/18 0600 01/19/18 0451 01/20/18 0458  Weight: 134.5 kg 133.5 kg 132.4 kg    Telemetry    NSR  2 episodes NSVT  5 beats max,.  - Personally Reviewed  ECG    No new tracings.   Physical Exam   General: Morbidly obese African American female  appearing in no acute distress. Head: Normocephalic, atraumatic.  Neck: Neck is full . Lungs:  Resp regular and unlabored, decreased along bases bilaterally. Heart: RRR, S1, S2, no S3, S4, or murmur; no rub. Abdomen: Soft, non-tender, non-distended with normoactive bowel sounds. No hepatomegaly. No rebound/guarding. No obvious abdominal masses. Extremities: No clubbing, cyanosis, 1-2+ pitting edema bilaterally, more prominent along LLE. Distal pedal pulses are 2+ bilaterally. Neuro: Alert and oriented X 3. Moves all extremities spontaneously. Psych: Normal affect.  Labs    Chemistry Recent Labs  Lab 01/18/18 0514 01/19/18 0451 01/20/18 0458  NA 138 138 139  K 3.6 3.9 3.8  CL 99 101 103  CO2 29 30 27   GLUCOSE 152* 188* 176*  BUN 21* 22* 22*  CREATININE 1.38* 1.42* 1.55*  CALCIUM 8.4* 8.7* 8.7*  GFRNONAA 42* 41* 37*  GFRAA 49* 47* 43*  ANIONGAP 10 7 9      Hematology Recent Labs  Lab 01/18/18 0514  WBC 6.1  RBC 4.10  HGB 12.3  HCT 38.2  MCV 93.2  MCH 30.0  MCHC 32.2  RDW 21.9*  PLT 271    Cardiac Enzymes No results for input(s):  TROPONINI in the last 168 hours. No results for input(s): TROPIPOC in the last 168 hours.   BNP No results for input(s): BNP, PROBNP in the last 168 hours.   DDimer No results for input(s): DDIMER in the last 168 hours.   Radiology    US Venous Img Lower Unilateral Left  Result Date: 01/17/2018 CLINICAL DATA:  58 year old with left leg swelling. EXAM: LEFT LOWER EXTREMITY VENOUS DOPPLER ULTRASOUND TECHNIQUE: Gray-scale sonography with graded compression, as well as color Doppler and duplex ultrasound were performed to evaluate the lower extremity deep venous systems from the level of the common femoral vein and including the common femoral, femoral, profunda femoral, popliteal and calf veins including the posterior tibial, peroneal and gastrocnemius veins when visible. The superficial great saphenous vein was also interrogated. Spectral  Doppler was utilized to evaluate flow at rest and with distal augmentation maneuvers in the common femoral, femoral and popliteal veins. COMPARISON:  None. FINDINGS: Contralateral Common Femoral Vein: Respiratory phasicity is normal and symmetric with the symptomatic side. No evidence of thrombus. Normal compressibility. Common Femoral Vein: No evidence of thrombus. Normal compressibility, respiratory phasicity and response to augmentation. Saphenofemoral Junction: No evidence of thrombus. Normal compressibility and flow on color Doppler imaging. Profunda Femoral Vein: No evidence of thrombus. Normal compressibility and flow on color Doppler imaging. Femoral Vein: No evidence of thrombus. Normal compressibility, respiratory phasicity and response to augmentation. Popliteal Vein: No evidence of thrombus. Normal compressibility, respiratory phasicity and response to augmentation. Calf Veins: Visualized left deep calf veins are patent without thrombus. Limited evaluation. Other Findings:  Subcutaneous edema. IMPRESSION: Negative for deep venous thrombosis in left lower extremity. Limited evaluation of the deep calf veins due to the subcutaneous edema and body habitus. Electronically Signed   By: Richarda Overlie M.D.   On: 01/17/2018 10:13    Cardiac Studies   Echocardiogram: 01/13/2018 Study Conclusions  - Left ventricle: The cavity size was moderately dilated. Wall   thickness was increased in a pattern of mild LVH. Systolic   function was severely reduced. The estimated ejection fraction   was in the range of 20% to 25%. Diffuse hypokinesis. Features are   consistent with a pseudonormal left ventricular filling pattern,   with concomitant abnormal relaxation and increased filling   pressure (grade 2 diastolic dysfunction). - Ventricular septum: Septal motion showed abnormal function and   dyssynergy. - Aortic valve: Mildly calcified annulus. Trileaflet; mildly   calcified leaflets. There was mild  regurgitation. - Mitral valve: Mildly calcified annulus. There was mild   regurgitation. - Left atrium: The atrium was mildly dilated. - Right ventricle: The cavity size was mildly dilated. Pacer wire   or catheter noted in right ventricle. Systolic function was   mildly reduced. - Right atrium: The atrium was mildly dilated. - Tricuspid valve: There was mild regurgitation. - Pulmonary arteries: Systolic pressure was moderately increased.   PA peak pressure: 46 mm Hg (S). - Pericardium, extracardiac: A trivial pericardial effusion was   identified.  Patient Profile     58 y.o. female w/ PMH of chronic combined CHF, NICM (normal cors 2012, cMRI 2012 without infiltrative disease), asthma, fibromyalgia, SLE, Stage 3 CKD, HTN, HLD, seizures, morbid obesity, Type 2 DM, stroke, ventricular tachycardia with ICD in place (recurrent VT in 08/2017 with low-dose Amio initiated but patient self-discontinued due to GI side-effects) who presented to  [facility] ED on 01/12/2018 for worsening dyspnea and lower extremity edema. Weight 291 lbs in 11/2017, elevated to 310 lbs  at time of admission.   Assessment & Plan    1. Acute on chronic combined CHF/NICM  - Patient continues to diurese   I/O nge   10.8 L   Wt though is slow to come down   Agree with advancing lasix gtt   Still volume overloaded   Though appears comfortable 2. AKI with CKD stage III  Cr 1.55  Need to follow closely   Was 1.42 yesterda   3. H/o ventricular tachycardia/ICD in place  - s/p ICD placement in 2013 with gen-change in 12/2016.Was on amiodarone 200 mg   Started this fall   She did not tolrate  N/ diarrhea even at lower dose   Stoppped   Will need to follow  Review with EP if recurrent problem 4. Essential HTN  BP is OK on current regimm  5. Morbid obesity - would benefit from a sleep study as an outpatient.    For questions or updates, please contact [facility] Please consult [email or web address] for contact info under  Cardiology/STEMI.   Signed, Dietrich Pates , PA-C 11:10 AM 01/20/2018 Pager: [phone]  Patient seen and examined   I agree with findings as noted by B Strader above   Pt is diuresing  Appears more comfortable   Still with increased volume on exam Neck:  Full Cardiac RRR   No S3 Lungs are rel clear Abd is obese Ext with ++ edema  Given progress and improved renal funciton I would continue with IV lasix    Follow electrolytes     Dietrich Pates

## 2018-01-20 NOTE — Progress Notes (Signed)
PROGRESS NOTE    Katherine [name]  RUE:[id number] DOB: [birth year] DOA: 01/12/2018 PCP: Alvina Filbert, MD   Brief Narrative:  58 year old female with a history of cardiomyopathy, low ejection fraction, lupus, chronic kidney disease stage III, hypertension and diabetes who presented to the hospital with worsening shortness of breath. She was found to be in decompensated CHF and admitted for IV diuresis.  Assessment & Plan:   Active Problems:   Non-ischemic cardiomyopathy (HCC)   AICD (automatic cardioverter/defibrillator) present   Diabetes mellitus with stage 3 chronic kidney disease (HCC)   Dyslipidemia   Lupus (HCC)   Acute on chronic combined systolic and diastolic CHF (congestive heart failure) (HCC)   Morbid obesity due to excess calories (HCC)  1. Acute on chronic combined CHF. Echocardiogram performed shows ejection fraction of 20 to 25%. Initially placed on IV Lasix twice daily which resulted in rising creatinine. Losartan and Aldactone currently on hold due to renal dysfunction. Continue on beta-blockers. Seen by cardiology and IV Lasix twice daily changed to Lasix infusion. She has had some diminished urine output on the Lasix infusion yesterday with only 500 cc noted, therefore I have increased infusion rate today. She still has evidence of volume overload. Continue to monitor renal function and urine output.   Current weight is 291lb. 2. Lupus. Continue on Plaquenil. No evidence of flares at this time. 3. Chronic kidney disease stage III. Creatinine had initially trended up with diuresis. Now on a Lasix infusion. Renal function appears to be stabilizing. Continue to monitor renal function and urine output. 4. Diabetes. Blood sugars have been stable. NPH currently on hold. Continue to monitor on sliding scale. 5. Hyperlipidemia. Continue statin 6. Hypertension. Clonidine and losartan currently on hold. Continue on beta-blockers. 7. AICD. History of  ventricular tachycardia. Was recently started on amiodarone by her electrophysiologist, but stopped taking this 1 week ago due to GI upset.   DVT prophylaxis: Heparin Code Status: Full code Family Communication: None at bedside Disposition Plan: Continue IV diuresis with Lasix for several days until she approaches baseline weight and the 280-282 pound range.  Likely DC to SNF at that point.   Consultants:   Cardiology  Procedures:  Echo:- Left ventricle: The cavity size was moderately dilated. Wall thickness was increased in a pattern of mild LVH. Systolic function was severely reduced. The estimated ejection fraction was in the range of 20% to 25%. Diffuse hypokinesis. Features are consistent with a pseudonormal left ventricular filling pattern, with concomitant abnormal relaxation and increased filling pressure (grade 2 diastolic dysfunction). - Ventricular septum: Septal motion showed abnormal function and dyssynergy. - Aortic valve: Mildly calcified annulus. Trileaflet; mildly calcified leaflets. There was mild regurgitation. - Mitral valve: Mildly calcified annulus. There was mild regurgitation. - Left atrium: The atrium was mildly dilated. - Right ventricle: The cavity size was mildly dilated. Pacer wire or catheter noted in right ventricle. Systolic function was mildly reduced. - Right atrium: The atrium was mildly dilated. - Tricuspid valve: There was mild regurgitation. - Pulmonary arteries: Systolic pressure was moderately increased. PA peak pressure: 46 mm Hg (S). - Pericardium, extracardiac: A trivial pericardial effusion was  identified.   Antimicrobials:   None  Subjective: Patient seen and evaluated today with no new acute complaints or concerns. No acute concerns or events noted overnight.  Objective: Vitals:   01/20/18 0456 01/20/18 0458 01/20/18 0834 01/20/18 0942  BP: (!) 128/91   121/87  Pulse: 98   82  Resp: 20  Temp: 97.6 F (36.4 C)     TempSrc: Oral     SpO2: 94%  92%   Weight:  132.4 kg    Height:        Intake/Output Summary (Last 24 hours) at 01/20/2018 1003 Last data filed at 01/20/2018 0333 Gross per 24 hour  Intake 787.29 ml  Output 1400 ml  Net -612.71 ml   Filed Weights   01/18/18 0600 01/19/18 0451 01/20/18 0458  Weight: 134.5 kg 133.5 kg 132.4 kg    Examination:  General exam: Appears calm and comfortable, obese Respiratory system: Clear to auscultation. Respiratory effort normal. Cardiovascular system: S1 & S2 heard, RRR. No JVD, murmurs, rubs, gallops or clicks. No pedal edema. Gastrointestinal system: Abdomen is nondistended, soft and nontender. No organomegaly or masses felt. Normal bowel sounds heard. Central nervous system: Alert and oriented. No focal neurological deficits. Extremities: Symmetric 5 x 5 power. Skin: No rashes, lesions or ulcers Psychiatry: Judgement and insight appear normal. Mood & affect appropriate.     Data Reviewed: I have personally reviewed following labs and imaging studies  CBC: Recent Labs  Lab 01/18/18 0514  WBC 6.1  HGB 12.3  HCT 38.2  MCV 93.2  PLT 271   Basic Metabolic Panel: Recent Labs  Lab 01/16/18 0600 01/17/18 0610 01/18/18 0514 01/19/18 0451 01/20/18 0458  NA 141 141 138 138 139  K 3.6 3.5 3.6 3.9 3.8  CL 105 104 99 101 103  CO2 26 30 29 30 27   GLUCOSE 181* 153* 152* 188* 176*  BUN 34* 26* 21* 22* 22*  CREATININE 1.94* 1.45* 1.38* 1.42* 1.55*  CALCIUM 8.5* 8.5* 8.4* 8.7* 8.7*  MG  --   --   --  1.8  --    GFR: Estimated Creatinine Clearance: 61.2 mL/min (A) (by C-G formula based on SCr of 1.55 mg/dL (H)). Liver Function Tests: No results for input(s): AST, ALT, ALKPHOS, BILITOT, PROT, ALBUMIN in the last 168 hours. No results for input(s): LIPASE, AMYLASE in the last 168 hours. No results for input(s): AMMONIA in the last 168 hours. Coagulation Profile: No results for input(s): INR, PROTIME in the  last 168 hours. Cardiac Enzymes: No results for input(s): CKTOTAL, CKMB, CKMBINDEX, TROPONINI in the last 168 hours. BNP (last 3 results) No results for input(s): PROBNP in the last 8760 hours. HbA1C: No results for input(s): HGBA1C in the last 72 hours. CBG: Recent Labs  Lab 01/19/18 0807 01/19/18 1145 01/19/18 1621 01/19/18 2205 01/20/18 0718  GLUCAP 125* 177* 173* 166* 150*   Lipid Profile: No results for input(s): CHOL, HDL, LDLCALC, TRIG, CHOLHDL, LDLDIRECT in the last 72 hours. Thyroid Function Tests: No results for input(s): TSH, T4TOTAL, FREET4, T3FREE, THYROIDAB in the last 72 hours. Anemia Panel: No results for input(s): VITAMINB12, FOLATE, FERRITIN, TIBC, IRON, RETICCTPCT in the last 72 hours. Sepsis Labs: No results for input(s): PROCALCITON, LATICACIDVEN in the last 168 hours.  No results found for this or any previous visit (from the past 240 hour(s)).       Radiology Studies: No results found.      Scheduled Meds: . clopidogrel  75 mg Oral Daily  . heparin  5,000 Units Subcutaneous Q8H  . hydrALAZINE  20 mg Oral Q8H  . insulin aspart  0-15 Units Subcutaneous TID WC  . insulin aspart  0-5 Units Subcutaneous QHS  . isosorbide mononitrate  30 mg Oral Daily  . loratadine  10 mg Oral Daily  . metoprolol succinate  50 mg Oral  Daily  . mometasone-formoterol  2 puff Inhalation BID  . polyethylene glycol  17 g Oral Daily  . potassium chloride  20 mEq Oral Daily  . simvastatin  20 mg Oral QHS  . sodium chloride flush  10-40 mL Intracatheter Q12H  . sodium chloride flush  3 mL Intravenous Q12H  . venlafaxine XR  150 mg Oral Daily   Continuous Infusions: . sodium chloride    . furosemide (LASIX) infusion 8 mg/hr (01/20/18 0817)     LOS: 8 days    Time spent: 30 minutes    Tewana Bohlen Hoover Brunette Leron Stoffers, DO [facility] Pager [phone]  If 7PM-7AM, please contact night-coverage [email or web address] Password [email or web address] 01/20/2018, 10:03 AM

## 2018-01-20 NOTE — Progress Notes (Signed)
Patient had a 4 beat run of v-tach, B/P 113/74,HR 81,asymptomatic. Dr Sherryll Burger notified. Will continue to monitor patient.

## 2018-01-20 NOTE — Progress Notes (Signed)
Physical Therapy Treatment Patient Details Name: Katherine [name] MRN: [id number] DOB: 1960-09-12 Today's Date: 01/20/2018    History of Present Illness  Katherine [name] is a 58 y.o. female with medical history significant of chronic systolic congestive heart failure ejection fraction of 30 to 35% on echo from 2012, lupus, chronic kidney disease stage III, hypertension diabetes, presents to the hospital with complaints of increasing shortness of breath.  Patient reports that she has been short of breath since October of this year.  Her symptoms of gotten worse over the past week.  She is noticed worsening lower extremity edema.  She has 4 pillow orthopnea.  She denies any chest discomfort.  She does have cough and has been feeling feverish lately.  She did not have any dysuria.  She has had some nausea and vomiting.  No diarrhea.  She has been feeling increasingly weak.  She has difficulty walking due to shortness of breath as well as heaviness in her legs.    PT Comments    Patient presents seated at bedside with NT assisting and agreeable for therapy.  Patient demonstrates increased bilateral knee flexion, able to flex past 90 degrees up to 100 degrees with less c/o pain, improvement for keeping feet behind knees during sit to stands, more stable using wide RW for sit to stands and transfers and tolerated sitting up in chair after therapy.  Patient will benefit from continued physical therapy in hospital and recommended venue below to increase strength, balance, endurance for safe ADLs and gait.    Follow Up Recommendations  SNF;Supervision/Assistance - 24 hour;Supervision for mobility/OOB     Equipment Recommendations       Recommendations for Other Services       Precautions / Restrictions Precautions Precautions: Fall Restrictions Weight Bearing Restrictions: No    Mobility  Bed Mobility               General bed mobility comments: presents seated at bedside with  nursing staff present  Transfers Overall transfer level: Needs assistance Equipment used: Rolling walker (2 wheeled) Transfers: Sit to/from [employer] Sit to Stand: Min assist;Mod assist Stand pivot transfers: Mod assist       General transfer comment: demonstrates improvement for proper body mechanics for sit to stands   Ambulation/Gait Ambulation/Gait assistance: Mod assist;Max assist Gait Distance (Feet): 6 Feet Assistive device: Rolling walker (2 wheeled) Gait Pattern/deviations: Decreased step length - right;Decreased step length - left;Decreased stride length Gait velocity: slow   General Gait Details: limited to 7-8 slow unsteady side steps, having to drag left foot due to weakness   Stairs             Wheelchair Mobility    Modified Rankin (Stroke Patients Only)       Balance Overall balance assessment: Needs assistance Sitting-balance support: Feet supported;No upper extremity supported Sitting balance-Leahy Scale: Good     Standing balance support: Bilateral upper extremity supported;During functional activity Standing balance-Leahy Scale: Fair Standing balance comment: fair with RW                            Cognition Arousal/Alertness: Awake/alert Behavior During Therapy: WFL for tasks assessed/performed Overall Cognitive Status: Within Functional Limits for tasks assessed  Exercises General Exercises - Lower Extremity Long Arc Quad: AROM;Strengthening;Both;Seated;5 reps Hip Flexion/Marching: AROM;Strengthening;10 reps;Seated;Both Toe Raises: AROM;Strengthening;Both;10 reps;Seated Heel Raises: AROM;Strengthening;Both;10 reps;Seated Other Exercises Other Exercises: seated bilateral knee flexion x 5 reps each    General Comments        Pertinent Vitals/Pain Pain Assessment: 0-10 Pain Score: 9  Pain Location: bilateral hands, wrists Pain Descriptors /  Indicators: Pins and needles;Tingling Pain Intervention(s): Limited activity within patient's tolerance;Monitored during session    Home Living                      Prior Function            PT Goals (current goals can now be found in the care plan section) Progress towards PT goals: Progressing toward goals    Frequency    Min 3X/week      PT Plan Current plan remains appropriate    Co-evaluation              AM-PAC PT "6 Clicks" Mobility   Outcome Measure  Help needed turning from your back to your side while in a flat bed without using bedrails?: None Help needed moving from lying on your back to sitting on the side of a flat bed without using bedrails?: A Little Help needed moving to and from a bed to a chair (including a wheelchair)?: A Lot Help needed standing up from a chair using your arms (e.g., wheelchair or bedside chair)?: A Lot Help needed to walk in hospital room?: A Lot Help needed climbing 3-5 steps with a railing? : Total 6 Click Score: 14    End of Session Equipment Utilized During Treatment: Gait belt Activity Tolerance: Patient tolerated treatment well;Patient limited by fatigue Patient left: in chair;with call bell/phone within reach Nurse Communication: Mobility status PT Visit Diagnosis: Unsteadiness on feet (R26.81);Other abnormalities of gait and mobility (R26.89);Muscle weakness (generalized) (M62.81);History of falling (Z91.81);Difficulty in walking, not elsewhere classified (R26.2)     Time: 0488-8916 PT Time Calculation (min) (ACUTE ONLY): 22 min  Charges:  $Therapeutic Exercise: 8-22 mins $Therapeutic Activity: 8-22 mins                     2:16 PM, 01/20/18 Katherine [name], MPT Physical Therapist with [facility] [phone] office [phone] mobile phone

## 2018-01-20 NOTE — Clinical Social Work Placement (Signed)
   CLINICAL SOCIAL WORK PLACEMENT  NOTE  Date:  01/20/2018  Patient Details  Name: Katherine [name] MRN: [id number] Date of Birth: [birth year]  Clinical Social Work is seeking post-discharge placement for this patient at the Skilled  Nursing Facility level of care (*CSW will initial, date and re-position this form in  chart as items are completed):  Yes   Patient/family provided with [facility] Social Work Department's list of facilities offering this level of care within the geographic area requested by the patient (or if unable, by the patient's family).  Yes   Patient/family informed of their freedom to choose among providers that offer the needed level of care, that participate in Medicare, Medicaid or managed care program needed by the patient, have an available bed and are willing to accept the patient.  Yes   Patient/family informed of [facility]'s ownership interest in [facility] and [facility], as well as of the fact that they are under no obligation to receive care at these facilities.  PASRR submitted to EDS on 01/17/18     PASRR number received on 01/17/18     Existing PASRR number confirmed on       FL2 transmitted to all facilities in geographic area requested by pt/family on 01/17/18     FL2 transmitted to all facilities within larger geographic area on       Patient informed that his/her managed care company has contracts with or will negotiate with certain facilities, including the following:            Patient/family informed of bed offers received.  Patient chooses bed at       Physician recommends and patient chooses bed at      Patient to be transferred to   on  .  Patient to be transferred to facility by       Patient family notified on   of transfer.  Name of family member notified:        PHYSICIAN       Additional Comment: Both [facility] and [facility] are reviewing MCD to make sure they can take patient w/o complications.   Pt verbalizes understanding that she has to commit to 30 days, and has to sign over her check during the time she would be in rehab.  Today, pt states that she was told by Dr earlier this week that she could likely go home on Sunday after she had lost the desired weight "because I will be able to go to the bathroom and take a shower on my own."  Pt is open to making a decsion based on her abilities at time of d/c.  She states she has worked with [facility] for [employer] services in the past.  Furthermore, she has a Nurse, adult chair at home.  States she needs a walker or cane, and bedside commode if she returns home.  Other items on wishlist include wheelchaair and lift chair.   _______________________________________________ Ida Rogue, LCSW 01/20/2018, 3:42 PM

## 2018-01-21 DIAGNOSIS — M329 Systemic lupus erythematosus, unspecified: Secondary | ICD-10-CM

## 2018-01-21 LAB — BASIC METABOLIC PANEL
Anion gap: 12 (ref 5–15)
BUN: 22 mg/dL — ABNORMAL HIGH (ref 6–20)
CO2: 26 mmol/L (ref 22–32)
Calcium: 8.9 mg/dL (ref 8.9–10.3)
Chloride: 100 mmol/L (ref 98–111)
Creatinine, Ser: 1.48 mg/dL — ABNORMAL HIGH (ref 0.44–1.00)
GFR calc Af Amer: 45 mL/min — ABNORMAL LOW (ref >60)
GFR calc non Af Amer: 39 mL/min — ABNORMAL LOW (ref >60)
Glucose, Bld: 145 mg/dL — ABNORMAL HIGH (ref 70–99)
Potassium: 3.5 mmol/L (ref 3.5–5.1)
Sodium: 138 mmol/L (ref 135–145)

## 2018-01-21 LAB — GLUCOSE, CAPILLARY
Glucose-Capillary: 146 mg/dL — ABNORMAL HIGH (ref 70–99)
Glucose-Capillary: 171 mg/dL — ABNORMAL HIGH (ref 70–99)
Glucose-Capillary: 186 mg/dL — ABNORMAL HIGH (ref 70–99)
Glucose-Capillary: 168 mg/dL — ABNORMAL HIGH (ref 70–99)

## 2018-01-21 MED ORDER — LEVALBUTEROL HCL 0.63 MG/3ML IN NEBU: 0.6300 mg | INHALATION_SOLUTION | Freq: Four times a day (QID) | RESPIRATORY_TRACT | Status: DC | PRN

## 2018-01-21 MED ORDER — POTASSIUM CHLORIDE CRYS ER 20 MEQ PO TBCR
40.0000 meq | EXTENDED_RELEASE_TABLET | Freq: Once | ORAL | Status: AC
  Administered 2018-01-21: 40 meq via ORAL
  Filled 2018-01-21: qty 2

## 2018-01-21 MED ORDER — POTASSIUM CHLORIDE CRYS ER 20 MEQ PO TBCR
40.0000 meq | EXTENDED_RELEASE_TABLET | Freq: Every day | ORAL | Status: DC
  Administered 2018-01-22 – 2018-01-24 (×3): 40 meq via ORAL
  Filled 2018-01-21: qty 2
  Filled 2018-01-21: qty 4
  Filled 2018-01-21: qty 2

## 2018-01-21 MED ORDER — METOLAZONE 5 MG PO TABS
2.5000 mg | ORAL_TABLET | Freq: Once | ORAL | Status: AC
  Administered 2018-01-21: 2.5 mg via ORAL
  Filled 2018-01-21: qty 1

## 2018-01-21 NOTE — Progress Notes (Signed)
Progress Note  Patient Name: Katherine [name] Date of Encounter: 01/21/2018  Primary Cardiologist: [name] EP: Dr. Lewayne Bunting  Subjective   Pt had SOB and wheezing last night   Not now   No CP   Inpatient Medications    Scheduled Meds: . clopidogrel  75 mg Oral Daily  . heparin  5,000 Units Subcutaneous Q8H  . hydrALAZINE  20 mg Oral Q8H  . hydroxychloroquine  200 mg Oral BID  . insulin aspart  0-15 Units Subcutaneous TID WC  . insulin aspart  0-5 Units Subcutaneous QHS  . isosorbide mononitrate  30 mg Oral Daily  . loratadine  10 mg Oral Daily  . metoprolol succinate  50 mg Oral Daily  . mometasone-formoterol  2 puff Inhalation BID  . polyethylene glycol  17 g Oral Daily  . potassium chloride  20 mEq Oral Daily  . simvastatin  20 mg Oral QHS  . sodium chloride flush  10-40 mL Intracatheter Q12H  . sodium chloride flush  3 mL Intravenous Q12H  . triamcinolone   Topical BID  . venlafaxine XR  150 mg Oral Daily   Continuous Infusions: . sodium chloride    . furosemide (LASIX) infusion 8 mg/hr (01/20/18 0817)   PRN Meds: sodium chloride, acetaminophen, alum & mag hydroxide-simeth, HYDROcodone-acetaminophen, magnesium hydroxide, promethazine, sodium chloride flush, sodium chloride flush   Vital Signs    Vitals:   01/20/18 2100 01/20/18 2142 01/21/18 0518 01/21/18 0818  BP:  123/79 (!) 129/94   Pulse:  90 95   Resp:  16 20   Temp:  (!) 97.5 F (36.4 C) 98 F (36.7 C)   TempSrc:  Oral Oral   SpO2: 90% 100% 100% 97%  Weight:   131.3 kg   Height:        Intake/Output Summary (Last 24 hours) at 01/21/2018 1034 Last data filed at 01/20/2018 1807 Gross per 24 hour  Intake 600 ml  Output 1900 ml  Net -1300 ml    Net INEg 12 L     Filed Weights   01/19/18 0451 01/20/18 0458 01/21/18 0518  Weight: 133.5 kg 132.4 kg 131.3 kg    Telemetry    NSR 1 6 beat NSVT   - Personally Reviewed  ECG    No new tracings.   Physical Exam   General: Morbidly  obese African American female appearing in no acute distress. Head: Normocephalic, atraumatic.  Neck: Neck is full .  Pt laying flat   Lungs:  Rel clear   Heart: RRR, S1, S2, no S3, S4, or murmur; no rub. Abdomen: Soft, non-tender, non-distended with normoactive bowel sounds. No hepatomegaly. No rebound/guarding. No obvious abdominal masses. Extremities: No clubbing, cyanosis, 1-2+ pitting edema bilaterally, more prominent along LLE. Distal pedal pulses are 2+ bilaterally. Neuro: Alert and oriented X 3. Moves all extremities spontaneously. Psych: Normal affect.  Labs    Chemistry Recent Labs  Lab 01/19/18 0451 01/20/18 0458 01/21/18 0638  NA 138 139 138  K 3.9 3.8 3.5  CL 101 103 100  CO2 30 27 26   GLUCOSE 188* 176* 145*  BUN 22* 22* 22*  CREATININE 1.42* 1.55* 1.48*  CALCIUM 8.7* 8.7* 8.9  GFRNONAA 41* 37* 39*  GFRAA 47* 43* 45*  ANIONGAP 7 9 12      Hematology Recent Labs  Lab 01/18/18 0514  WBC 6.1  RBC 4.10  HGB 12.3  HCT 38.2  MCV 93.2  MCH 30.0  MCHC 32.2  RDW 21.9*  PLT 271    Cardiac Enzymes No results for input(s): TROPONINI in the last 168 hours. No results for input(s): TROPIPOC in the last 168 hours.   BNP No results for input(s): BNP, PROBNP in the last 168 hours.   DDimer No results for input(s): DDIMER in the last 168 hours.   Radiology    US Venous Img Lower Unilateral Left  Result Date: 01/17/2018 CLINICAL DATA:  58 year old with left leg swelling. EXAM: LEFT LOWER EXTREMITY VENOUS DOPPLER ULTRASOUND TECHNIQUE: Gray-scale sonography with graded compression, as well as color Doppler and duplex ultrasound were performed to evaluate the lower extremity deep venous systems from the level of the common femoral vein and including the common femoral, femoral, profunda femoral, popliteal and calf veins including the posterior tibial, peroneal and gastrocnemius veins when visible. The superficial great saphenous vein was also interrogated. Spectral  Doppler was utilized to evaluate flow at rest and with distal augmentation maneuvers in the common femoral, femoral and popliteal veins. COMPARISON:  None. FINDINGS: Contralateral Common Femoral Vein: Respiratory phasicity is normal and symmetric with the symptomatic side. No evidence of thrombus. Normal compressibility. Common Femoral Vein: No evidence of thrombus. Normal compressibility, respiratory phasicity and response to augmentation. Saphenofemoral Junction: No evidence of thrombus. Normal compressibility and flow on color Doppler imaging. Profunda Femoral Vein: No evidence of thrombus. Normal compressibility and flow on color Doppler imaging. Femoral Vein: No evidence of thrombus. Normal compressibility, respiratory phasicity and response to augmentation. Popliteal Vein: No evidence of thrombus. Normal compressibility, respiratory phasicity and response to augmentation. Calf Veins: Visualized left deep calf veins are patent without thrombus. Limited evaluation. Other Findings:  Subcutaneous edema. IMPRESSION: Negative for deep venous thrombosis in left lower extremity. Limited evaluation of the deep calf veins due to the subcutaneous edema and body habitus. Electronically Signed   By: Richarda Overlie M.D.   On: 01/17/2018 10:13    Cardiac Studies   Echocardiogram: 01/13/2018 Study Conclusions  - Left ventricle: The cavity size was moderately dilated. Wall   thickness was increased in a pattern of mild LVH. Systolic   function was severely reduced. The estimated ejection fraction   was in the range of 20% to 25%. Diffuse hypokinesis. Features are   consistent with a pseudonormal left ventricular filling pattern,   with concomitant abnormal relaxation and increased filling   pressure (grade 2 diastolic dysfunction). - Ventricular septum: Septal motion showed abnormal function and   dyssynergy. - Aortic valve: Mildly calcified annulus. Trileaflet; mildly   calcified leaflets. There was mild  regurgitation. - Mitral valve: Mildly calcified annulus. There was mild   regurgitation. - Left atrium: The atrium was mildly dilated. - Right ventricle: The cavity size was mildly dilated. Pacer wire   or catheter noted in right ventricle. Systolic function was   mildly reduced. - Right atrium: The atrium was mildly dilated. - Tricuspid valve: There was mild regurgitation. - Pulmonary arteries: Systolic pressure was moderately increased.   PA peak pressure: 46 mm Hg (S). - Pericardium, extracardiac: A trivial pericardial effusion was   identified.  Patient Profile     58 y.o. female w/ PMH of chronic combined CHF, NICM (normal cors 2012, cMRI 2012 without infiltrative disease), asthma, fibromyalgia, SLE, Stage 3 CKD, HTN, HLD, seizures, morbid obesity, Type 2 DM, stroke, ventricular tachycardia with ICD in place (recurrent VT in 08/2017 with low-dose Amio initiated but patient self-discontinued due to GI side-effects) who presented to  [facility] ED on 01/12/2018 for worsening dyspnea and lower  extremity edema. Weight 291 lbs in 11/2017, elevated to 310 lbs at time of admission.   Assessment & Plan    1. Acute on chronic combined CHF/NICM   LVEF 25 to 30%   RVEF mildly depressed    Volume status improving but still up    I would try 1 dose of Zaroxylyn to acclerate diuresis    Discussed fluid and salt restriction   Pt says she was drinkng 8 glasses of water per day  PRob should be a little less 1.5 to 2 L   She may do better with lasix 80 daily as outpt instead of 40 bid that she came in on    2. AKI with CKD stage III  Cr 1.48   Improved from yesterday  Follow    3. H/o ventricular tachycardia/ICD in place  - s/p ICD placement in 2013 with gen-change in 12/2016.Was on amiodarone 200 mg   Started this fall   She did not tolrate  N/ diarrhea developed,  even at lower dose of 100 mg per day     Stoppped   Will need to follow  1 6 beat NSVT  4. Essential HTN  BP is OK  5. Morbid  obesity - would benefit from a sleep study as an outpatient.    For questions or updates, please contact [facility] Please consult [email or web address] for contact info under Cardiology/STEMI.   Scherrie Merritts , PA-C 10:34 AM 01/21/2018 Pager: [phone]    Dietrich Pates

## 2018-01-21 NOTE — Progress Notes (Signed)
PROGRESS NOTE    Katherine [name]  JAS:[id number] DOB: 07/15/1960 DOA: 01/12/2018 PCP: Alvina Filbert, MD   Brief Narrative:  58 year old female with a history of cardiomyopathy, low ejection fraction, lupus, chronic kidney disease stage III, hypertension and diabetes who presented to the hospital with worsening shortness of breath. She was found to be in decompensated CHF and admitted for IV diuresis.  Assessment & Plan:   Active Problems:   Non-ischemic cardiomyopathy (HCC)   AICD (automatic cardioverter/defibrillator) present   Diabetes mellitus with stage 3 chronic kidney disease (HCC)   Dyslipidemia   Lupus (HCC)   Acute on chronic combined systolic and diastolic CHF (congestive heart failure) (HCC)   Morbid obesity due to excess calories (HCC)  1. Acute on chronic combined CHF-improving. Echocardiogram performed shows ejection fraction of 20 to 25%. Initially placed on IV Lasix twice daily which resulted in rising creatinine. Losartan and Aldactone currently on hold due to renal dysfunction. Continue on beta-blockers. Seen by cardiology and IV Lasix twice daily changed to Lasix infusion. She has had some diminished urine output on the Lasix infusion yesterday with only 500 cc noted, therefore infusion rate doubled on 1/2. She still has evidence of volume overload. Continue to monitor renal function and urine output.Current weight is 289lb. Zaroxolyn added by cardiology today to help with further diuresis.  Repeat BMP in a.m. 2. Lupus. Continue on Plaquenil. No evidence of flares at this time. 3. Chronic kidney disease stage III. Creatinine had initially trended up with diuresis. Now on a Lasix infusion. Renal function appears to be stabilizing. Continue to monitor renal function and urine output.  Recheck magnesium in a.m. as well. 4. Diabetes. Blood sugars have been stable. NPH currently on hold. Continue to monitor on sliding scale. 5. Hyperlipidemia.  Continue statin 6. Hypertension. Clonidine and losartan currently on hold. Continue on beta-blockers. 7. AICD-with episodes of NSVT. History of ventricular tachycardia. Was recently started on amiodarone by her electrophysiologist, but stopped taking this 1 week ago due to GI upset.  Will need to continue to follow.   DVT prophylaxis:Heparin Code Status:Full code Family Communication:None at bedside Disposition Plan:Continue IV diuresis with Lasix for several days until she approaches baseline weight and the 280-282 pound range. Likely DC to SNF at that point.   Consultants:  Cardiology  Procedures: Echo:- Left ventricle: The cavity size was moderately dilated. Wall thickness was increased in a pattern of mild LVH. Systolic function was severely reduced. The estimated ejection fraction was in the range of 20% to 25%. Diffuse hypokinesis. Features are consistent with a pseudonormal left ventricular filling pattern, with concomitant abnormal relaxation and increased filling pressure (grade 2 diastolic dysfunction). - Ventricular septum: Septal motion showed abnormal function and dyssynergy. - Aortic valve: Mildly calcified annulus. Trileaflet; mildly calcified leaflets. There was mild regurgitation. - Mitral valve: Mildly calcified annulus. There was mild regurgitation. - Left atrium: The atrium was mildly dilated. - Right ventricle: The cavity size was mildly dilated. Pacer wire or catheter noted in right ventricle. Systolic function was mildly reduced. - Right atrium: The atrium was mildly dilated. - Tricuspid valve: There was mild regurgitation. - Pulmonary arteries: Systolic pressure was moderately increased. PA peak pressure: 46 mm Hg (S). - Pericardium, extracardiac: A trivial pericardial effusion was  identified.   Antimicrobials:   None  Subjective: Patient seen and evaluated today with no new acute complaints or  concerns. No acute concerns or events noted overnight.  Patient was noted to have some mild shortness of breath  and wheezing last night.  Objective: Vitals:   01/20/18 2100 01/20/18 2142 01/21/18 0518 01/21/18 0818  BP:  123/79 (!) 129/94   Pulse:  90 95   Resp:  16 20   Temp:  (!) 97.5 F (36.4 C) 98 F (36.7 C)   TempSrc:  Oral Oral   SpO2: 90% 100% 100% 97%  Weight:   131.3 kg   Height:        Intake/Output Summary (Last 24 hours) at 01/21/2018 1145 Last data filed at 01/20/2018 1807 Gross per 24 hour  Intake 600 ml  Output 1900 ml  Net -1300 ml   Filed Weights   01/19/18 0451 01/20/18 0458 01/21/18 0518  Weight: 133.5 kg 132.4 kg 131.3 kg    Examination:  General exam: Appears calm and comfortable, obese Respiratory system: Clear to auscultation. Respiratory effort normal.,  On 2 L nasal cannula. Cardiovascular system: S1 & S2 heard, RRR. No JVD, murmurs, rubs, gallops or clicks.  Positive pedal edema bilaterally. Gastrointestinal system: Abdomen is nondistended, soft and nontender. No organomegaly or masses felt. Normal bowel sounds heard. Central nervous system: Alert and oriented. No focal neurological deficits. Extremities: Symmetric 5 x 5 power. Skin: No rashes, lesions or ulcers Psychiatry: Judgement and insight appear normal. Mood & affect appropriate.     Data Reviewed: I have personally reviewed following labs and imaging studies  CBC: Recent Labs  Lab 01/18/18 0514  WBC 6.1  HGB 12.3  HCT 38.2  MCV 93.2  PLT 271   Basic Metabolic Panel: Recent Labs  Lab 01/17/18 0610 01/18/18 0514 01/19/18 0451 01/20/18 0458 01/21/18 0638  NA 141 138 138 139 138  K 3.5 3.6 3.9 3.8 3.5  CL 104 99 101 103 100  CO2 30 29 30 27 26   GLUCOSE 153* 152* 188* 176* 145*  BUN 26* 21* 22* 22* 22*  CREATININE 1.45* 1.38* 1.42* 1.55* 1.48*  CALCIUM 8.5* 8.4* 8.7* 8.7* 8.9  MG  --   --  1.8  --   --    GFR: Estimated Creatinine Clearance: 63.8 mL/min (A) (by C-G  formula based on SCr of 1.48 mg/dL (H)). Liver Function Tests: No results for input(s): AST, ALT, ALKPHOS, BILITOT, PROT, ALBUMIN in the last 168 hours. No results for input(s): LIPASE, AMYLASE in the last 168 hours. No results for input(s): AMMONIA in the last 168 hours. Coagulation Profile: No results for input(s): INR, PROTIME in the last 168 hours. Cardiac Enzymes: No results for input(s): CKTOTAL, CKMB, CKMBINDEX, TROPONINI in the last 168 hours. BNP (last 3 results) No results for input(s): PROBNP in the last 8760 hours. HbA1C: No results for input(s): HGBA1C in the last 72 hours. CBG: Recent Labs  Lab 01/20/18 0718 01/20/18 1117 01/20/18 1636 01/20/18 2130 01/21/18 0801  GLUCAP 150* 171* 107* 188* 146*   Lipid Profile: No results for input(s): CHOL, HDL, LDLCALC, TRIG, CHOLHDL, LDLDIRECT in the last 72 hours. Thyroid Function Tests: No results for input(s): TSH, T4TOTAL, FREET4, T3FREE, THYROIDAB in the last 72 hours. Anemia Panel: No results for input(s): VITAMINB12, FOLATE, FERRITIN, TIBC, IRON, RETICCTPCT in the last 72 hours. Sepsis Labs: No results for input(s): PROCALCITON, LATICACIDVEN in the last 168 hours.  No results found for this or any previous visit (from the past 240 hour(s)).       Radiology Studies: No results found.      Scheduled Meds: . clopidogrel  75 mg Oral Daily  . heparin  5,000 Units Subcutaneous Q8H  . hydrALAZINE  20 mg Oral Q8H  . hydroxychloroquine  200 mg Oral BID  . insulin aspart  0-15 Units Subcutaneous TID WC  . insulin aspart  0-5 Units Subcutaneous QHS  . isosorbide mononitrate  30 mg Oral Daily  . loratadine  10 mg Oral Daily  . metolazone  2.5 mg Oral Once  . metoprolol succinate  50 mg Oral Daily  . mometasone-formoterol  2 puff Inhalation BID  . polyethylene glycol  17 g Oral Daily  . [START ON 01/22/2018] potassium chloride  40 mEq Oral Daily  . potassium chloride  40 mEq Oral Once  . simvastatin  20 mg Oral  QHS  . sodium chloride flush  10-40 mL Intracatheter Q12H  . sodium chloride flush  3 mL Intravenous Q12H  . triamcinolone   Topical BID  . venlafaxine XR  150 mg Oral Daily   Continuous Infusions: . sodium chloride    . furosemide (LASIX) infusion 8 mg/hr (01/20/18 0817)     LOS: 9 days    Time spent: 30 minutes    Tom Ragsdale Hoover Brunette, DO [facility] Pager [phone]  If 7PM-7AM, please contact night-coverage [email or web address] Password [email or web address] 01/21/2018, 11:45 AM

## 2018-01-22 LAB — GLUCOSE, CAPILLARY
Glucose-Capillary: 119 mg/dL — ABNORMAL HIGH (ref 70–99)
Glucose-Capillary: 184 mg/dL — ABNORMAL HIGH (ref 70–99)
Glucose-Capillary: 181 mg/dL — ABNORMAL HIGH (ref 70–99)
Glucose-Capillary: 196 mg/dL — ABNORMAL HIGH (ref 70–99)

## 2018-01-22 LAB — MAGNESIUM: MAGNESIUM: 2 mg/dL (ref 1.7–2.4)

## 2018-01-22 LAB — BASIC METABOLIC PANEL
Anion gap: 12 (ref 5–15)
BUN: 24 mg/dL — ABNORMAL HIGH (ref 6–20)
CO2: 28 mmol/L (ref 22–32)
Calcium: 9.1 mg/dL (ref 8.9–10.3)
Chloride: 96 mmol/L — ABNORMAL LOW (ref 98–111)
Creatinine, Ser: 1.56 mg/dL — ABNORMAL HIGH (ref 0.44–1.00)
GFR calc Af Amer: 42 mL/min — ABNORMAL LOW (ref >60)
GFR calc non Af Amer: 37 mL/min — ABNORMAL LOW (ref >60)
Glucose, Bld: 213 mg/dL — ABNORMAL HIGH (ref 70–99)
Potassium: 3.5 mmol/L (ref 3.5–5.1)
Sodium: 136 mmol/L (ref 135–145)

## 2018-01-22 NOTE — Progress Notes (Signed)
PROGRESS NOTE    Katherine [name]  RUE:[id number] DOB: 02-14-1960 DOA: 01/12/2018 PCP: Alvina FilbertHunter, Denise, MD   Brief Narrative:  58 year old female with a history of cardiomyopathy, low ejection fraction, lupus, chronic kidney disease stage III, hypertension and diabetes who presented to the hospital with worsening shortness of breath. She was found to be in decompensated CHF and admitted for IV diuresis.  Assessment & Plan:   Active Problems:   Non-ischemic cardiomyopathy (HCC)   AICD (automatic cardioverter/defibrillator) present   Diabetes mellitus with stage 3 chronic kidney disease (HCC)   Dyslipidemia   Lupus (HCC)   Acute on chronic combined systolic and diastolic CHF (congestive heart failure) (HCC)   Morbid obesity due to excess calories (HCC)  1. Acute on chronic combined CHF-improving. Echocardiogram performed shows ejection fraction of 20 to 25%. Initially placed on IV Lasix twice daily which resulted in rising creatinine. Losartan and Aldactone currently on hold due to renal dysfunction. Continue on beta-blockers. Seen by cardiology and IV Lasix twice daily changed to Lasix infusion. She has had some diminished urine output on the Lasix infusion yesterday with only 500 cc noted, therefore infusion rate doubled on 1/2. Shestill has evidence of volume overload. Continue to monitor renal function and urine output.Current weight is 277lb but doubt accuracy due to such a significant change overnight.  She is currently -1.4 L since yesterday. Repeat BMP in a.m. 2. Lupus. Continue on Plaquenil. No evidence of flares at this time. 3. Chronic kidney disease stage III. Creatinine had initially trended up with diuresis. Now on a Lasix infusion. Renal function appears to be stabilizing. Continue to monitor renal function and urine output.  4. Diabetes. Blood sugars have been stable. NPH currently on hold. Continue to monitor on sliding scale. 5. Hyperlipidemia.  Continue statin 6. Hypertension. Clonidine and losartan currently on hold. Continue on beta-blockers. 7. AICD-with episodes of NSVT. History of ventricular tachycardia. Was recently started on amiodarone by her electrophysiologist, but stopped taking this 1 week ago due to GI upset.  Will need to continue to follow.   DVT prophylaxis:Heparin Code Status:Full code Family Communication:None at bedside Disposition Plan:Continue IV diuresis with Lasix for several days until she approaches baseline weight and the 280-282 pound range. Likely DC to SNF at that point.   Consultants:  Cardiology  Procedures: Echo:- Left ventricle: The cavity size was moderately dilated. Wall thickness was increased in a pattern of mild LVH. Systolic function was severely reduced. The estimated ejection fraction was in the range of 20% to 25%. Diffuse hypokinesis. Features are consistent with a pseudonormal left ventricular filling pattern, with concomitant abnormal relaxation and increased filling pressure (grade 2 diastolic dysfunction). - Ventricular septum: Septal motion showed abnormal function and dyssynergy. - Aortic valve: Mildly calcified annulus. Trileaflet; mildly calcified leaflets. There was mild regurgitation. - Mitral valve: Mildly calcified annulus. There was mild regurgitation. - Left atrium: The atrium was mildly dilated. - Right ventricle: The cavity size was mildly dilated. Pacer wire or catheter noted in right ventricle. Systolic function was mildly reduced. - Right atrium: The atrium was mildly dilated. - Tricuspid valve: There was mild regurgitation. - Pulmonary arteries: Systolic pressure was moderately increased. PA peak pressure: 46 mm Hg (S). - Pericardium, extracardiac: A trivial pericardial effusion was  identified.   Antimicrobials:   None  Subjective: Patient seen and evaluated today with no new acute complaints or  concerns. No acute concerns or events noted overnight.  She complains of a mild stomachache, but no shortness of  breath or wheezing.  She is eager to have some breakfast.  Objective: Vitals:   01/21/18 2215 01/22/18 0520 01/22/18 0536 01/22/18 0700  BP: (!) 133/91  (!) 129/92   Pulse: 89  93   Resp: 20  18   Temp: 98 F (36.7 C)  98.3 F (36.8 C)   TempSrc: Oral  Oral   SpO2: 99%  100% 96%  Weight:  125.7 kg    Height:        Intake/Output Summary (Last 24 hours) at 01/22/2018 1059 Last data filed at 01/22/2018 0900 Gross per 24 hour  Intake 480 ml  Output 3050 ml  Net -2570 ml   Filed Weights   01/20/18 0458 01/21/18 0518 01/22/18 0520  Weight: 132.4 kg 131.3 kg 125.7 kg    Examination:  General exam: Appears calm and comfortable  Respiratory system: Clear to auscultation. Respiratory effort normal. Cardiovascular system: S1 & S2 heard, RRR. No JVD, murmurs, rubs, gallops or clicks.  Continues to have edema that is scant in bilateral lower extremities, but improving. Gastrointestinal system: Abdomen is nondistended, soft and nontender. No organomegaly or masses felt. Normal bowel sounds heard. Central nervous system: Alert and oriented. No focal neurological deficits. Extremities: Symmetric 5 x 5 power. Skin: No rashes, lesions or ulcers Psychiatry: Judgement and insight appear normal. Mood & affect appropriate.     Data Reviewed: I have personally reviewed following labs and imaging studies  CBC: Recent Labs  Lab 01/18/18 0514  WBC 6.1  HGB 12.3  HCT 38.2  MCV 93.2  PLT 271   Basic Metabolic Panel: Recent Labs  Lab 01/18/18 0514 01/19/18 0451 01/20/18 0458 01/21/18 0638 01/22/18 0636  NA 138 138 139 138 136  K 3.6 3.9 3.8 3.5 3.5  CL 99 101 103 100 96*  CO2 29 30 27 26 28   GLUCOSE 152* 188* 176* 145* 213*  BUN 21* 22* 22* 22* 24*  CREATININE 1.38* 1.42* 1.55* 1.48* 1.56*  CALCIUM 8.4* 8.7* 8.7* 8.9 9.1  MG  --  1.8  --   --  2.0   GFR: Estimated  Creatinine Clearance: 59.1 mL/min (A) (by C-G formula based on SCr of 1.56 mg/dL (H)). Liver Function Tests: No results for input(s): AST, ALT, ALKPHOS, BILITOT, PROT, ALBUMIN in the last 168 hours. No results for input(s): LIPASE, AMYLASE in the last 168 hours. No results for input(s): AMMONIA in the last 168 hours. Coagulation Profile: No results for input(s): INR, PROTIME in the last 168 hours. Cardiac Enzymes: No results for input(s): CKTOTAL, CKMB, CKMBINDEX, TROPONINI in the last 168 hours. BNP (last 3 results) No results for input(s): PROBNP in the last 8760 hours. HbA1C: No results for input(s): HGBA1C in the last 72 hours. CBG: Recent Labs  Lab 01/21/18 0801 01/21/18 1146 01/21/18 1602 01/21/18 2212 01/22/18 0802  GLUCAP 146* 171* 186* 168* 119*   Lipid Profile: No results for input(s): CHOL, HDL, LDLCALC, TRIG, CHOLHDL, LDLDIRECT in the last 72 hours. Thyroid Function Tests: No results for input(s): TSH, T4TOTAL, FREET4, T3FREE, THYROIDAB in the last 72 hours. Anemia Panel: No results for input(s): VITAMINB12, FOLATE, FERRITIN, TIBC, IRON, RETICCTPCT in the last 72 hours. Sepsis Labs: No results for input(s): PROCALCITON, LATICACIDVEN in the last 168 hours.  No results found for this or any previous visit (from the past 240 hour(s)).       Radiology Studies: No results found.      Scheduled Meds: . clopidogrel  75 mg Oral Daily  . heparin  5,000 Units Subcutaneous Q8H  . hydrALAZINE  20 mg Oral Q8H  . hydroxychloroquine  200 mg Oral BID  . insulin aspart  0-15 Units Subcutaneous TID WC  . insulin aspart  0-5 Units Subcutaneous QHS  . isosorbide mononitrate  30 mg Oral Daily  . loratadine  10 mg Oral Daily  . metoprolol succinate  50 mg Oral Daily  . mometasone-formoterol  2 puff Inhalation BID  . polyethylene glycol  17 g Oral Daily  . potassium chloride  40 mEq Oral Daily  . simvastatin  20 mg Oral QHS  . sodium chloride flush  10-40 mL  Intracatheter Q12H  . sodium chloride flush  3 mL Intravenous Q12H  . triamcinolone   Topical BID  . venlafaxine XR  150 mg Oral Daily   Continuous Infusions: . sodium chloride    . furosemide (LASIX) infusion 8 mg/hr (01/21/18 1205)     LOS: 10 days    Time spent: 30 minutes     Hoover Brunette, DO [facility] Pager [phone]  If 7PM-7AM, please contact night-coverage [email or web address] Password [email or web address] 01/22/2018, 10:59 AM

## 2018-01-23 LAB — BASIC METABOLIC PANEL
Anion gap: 11 (ref 5–15)
BUN: 29 mg/dL — ABNORMAL HIGH (ref 6–20)
CO2: 30 mmol/L (ref 22–32)
Calcium: 9 mg/dL (ref 8.9–10.3)
Chloride: 95 mmol/L — ABNORMAL LOW (ref 98–111)
Creatinine, Ser: 1.86 mg/dL — ABNORMAL HIGH (ref 0.44–1.00)
GFR calc Af Amer: 34 mL/min — ABNORMAL LOW (ref >60)
GFR calc non Af Amer: 30 mL/min — ABNORMAL LOW (ref >60)
Glucose, Bld: 201 mg/dL — ABNORMAL HIGH (ref 70–99)
Potassium: 3.6 mmol/L (ref 3.5–5.1)
Sodium: 136 mmol/L (ref 135–145)

## 2018-01-23 LAB — GLUCOSE, CAPILLARY
GLUCOSE-CAPILLARY: 143 mg/dL — AB (ref 70–99)
Glucose-Capillary: 172 mg/dL — ABNORMAL HIGH (ref 70–99)
Glucose-Capillary: 141 mg/dL — ABNORMAL HIGH (ref 70–99)
Glucose-Capillary: 169 mg/dL — ABNORMAL HIGH (ref 70–99)
Glucose-Capillary: 169 mg/dL — ABNORMAL HIGH (ref 70–99)

## 2018-01-23 NOTE — Progress Notes (Signed)
PROGRESS NOTE    Katherine [name]  NLZ:[id number] DOB: 11-18-1960 DOA: 01/12/2018 PCP: Alvina Filbert, MD   Brief Narrative:  58 year old female with a history of cardiomyopathy, low ejection fraction, lupus, chronic kidney disease stage III, hypertension and diabetes who presented to the hospital with worsening shortness of breath. She was found to be in decompensated CHF and admitted for IV diuresis.  Assessment & Plan:   Active Problems:   Non-ischemic cardiomyopathy (HCC)   AICD (automatic cardioverter/defibrillator) present   Diabetes mellitus with stage 3 chronic kidney disease (HCC)   Dyslipidemia   Lupus (HCC)   Acute on chronic combined systolic and diastolic CHF (congestive heart failure) (HCC)   Morbid obesity due to excess calories (HCC)  1. Acute on chronic combined CHF-improving. Echocardiogram performed shows ejection fraction of 20 to 25%. Initially placed on IV Lasix twice daily which resulted in rising creatinine. Losartan and Aldactone currently on hold due to renal dysfunction. Continue on beta-blockers. Seen by cardiology and IV Lasix twice daily changed to Lasix infusion.  We will plan to hold Lasix infusion at this point and she has minus almost 4 L this morning with weights now in the 260 range.  She appears to have some renal intolerance. Repeat BMP in a.m. 2. Lupus. Continue on Plaquenil. No evidence of flares at this time. 3. Chronic kidney disease stage III. Discontinue IV Lasix today due to renal intolerance that is noted on BMP today.  Recheck in a.m. 4. Diabetes. Blood sugars have been stable. NPH currently on hold. Continue to monitor on sliding scale. 5. Hyperlipidemia. Continue statin 6. Hypertension. Clonidine and losartan currently on hold. Continue on beta-blockers. 7. AICD-with episodes of NSVT. History of ventricular tachycardia. Was recently started on amiodarone by her electrophysiologist, but stopped taking this 1 week ago due  to GI upset.Will need to continue to follow.   DVT prophylaxis:Heparin Code Status:Full code Family Communication:None at bedside Disposition Plan:Discontinue IV diuresis due to renal intolerance today.  Appreciate further cardiology recommendations prior to discharge by a.m.  May require discharge to home with home health versus SNF.   Consultants:  Cardiology  Procedures: Echo:- Left ventricle: The cavity size was moderately dilated. Wall thickness was increased in a pattern of mild LVH. Systolic function was severely reduced. The estimated ejection fraction was in the range of 20% to 25%. Diffuse hypokinesis. Features are consistent with a pseudonormal left ventricular filling pattern, with concomitant abnormal relaxation and increased filling pressure (grade 2 diastolic dysfunction). - Ventricular septum: Septal motion showed abnormal function and dyssynergy. - Aortic valve: Mildly calcified annulus. Trileaflet; mildly calcified leaflets. There was mild regurgitation. - Mitral valve: Mildly calcified annulus. There was mild regurgitation. - Left atrium: The atrium was mildly dilated. - Right ventricle: The cavity size was mildly dilated. Pacer wire or catheter noted in right ventricle. Systolic function was mildly reduced. - Right atrium: The atrium was mildly dilated. - Tricuspid valve: There was mild regurgitation. - Pulmonary arteries: Systolic pressure was moderately increased. PA peak pressure: 46 mm Hg (S). - Pericardium, extracardiac: A trivial pericardial effusion was  identified.   Antimicrobials:   None   Subjective: Patient seen and evaluated today with no new acute complaints or concerns. No acute concerns or events noted overnight.  She appears to overall be doing much better with much less edema to her lower extremities.  She is concerned about her ability to go to inpatient rehabilitation.  Objective: Vitals:    01/23/18 0500 01/23/18 0547 01/23/18 9024  01/23/18 0800  BP:  115/70    Pulse:  88    Resp:  17    Temp:  (!) 97.5 F (36.4 C)    TempSrc:  Oral    SpO2:  98%  97%  Weight: 121.5 kg  119 kg   Height:        Intake/Output Summary (Last 24 hours) at 01/23/2018 1013 Last data filed at 01/23/2018 0615 Gross per 24 hour  Intake 240 ml  Output 3125 ml  Net -2885 ml   Filed Weights   01/22/18 0520 01/23/18 0500 01/23/18 0612  Weight: 125.7 kg 121.5 kg 119 kg    Examination:  General exam: Appears calm and comfortable  Respiratory system: Clear to auscultation. Respiratory effort normal. Cardiovascular system: S1 & S2 heard, RRR. No JVD, murmurs, rubs, gallops or clicks. No pedal edema. Gastrointestinal system: Abdomen is nondistended, soft and nontender. No organomegaly or masses felt. Normal bowel sounds heard. Central nervous system: Alert and oriented. No focal neurological deficits. Extremities: Symmetric 5 x 5 power. Skin: No rashes, lesions or ulcers Psychiatry: Judgement and insight appear normal. Mood & affect appropriate.     Data Reviewed: I have personally reviewed following labs and imaging studies  CBC: Recent Labs  Lab 01/18/18 0514  WBC 6.1  HGB 12.3  HCT 38.2  MCV 93.2  PLT 271   Basic Metabolic Panel: Recent Labs  Lab 01/19/18 0451 01/20/18 0458 01/21/18 0638 01/22/18 0636 01/23/18 0607  NA 138 139 138 136 136  K 3.9 3.8 3.5 3.5 3.6  CL 101 103 100 96* 95*  CO2 30 27 26 28 30   GLUCOSE 188* 176* 145* 213* 201*  BUN 22* 22* 22* 24* 29*  CREATININE 1.42* 1.55* 1.48* 1.56* 1.86*  CALCIUM 8.7* 8.7* 8.9 9.1 9.0  MG 1.8  --   --  2.0  --    GFR: Estimated Creatinine Clearance: 48.2 mL/min (A) (by C-G formula based on SCr of 1.86 mg/dL (H)). Liver Function Tests: No results for input(s): AST, ALT, ALKPHOS, BILITOT, PROT, ALBUMIN in the last 168 hours. No results for input(s): LIPASE, AMYLASE in the last 168 hours. No results for input(s):  AMMONIA in the last 168 hours. Coagulation Profile: No results for input(s): INR, PROTIME in the last 168 hours. Cardiac Enzymes: No results for input(s): CKTOTAL, CKMB, CKMBINDEX, TROPONINI in the last 168 hours. BNP (last 3 results) No results for input(s): PROBNP in the last 8760 hours. HbA1C: No results for input(s): HGBA1C in the last 72 hours. CBG: Recent Labs  Lab 01/22/18 0802 01/22/18 1126 01/22/18 1627 01/22/18 2209 01/23/18 0826  GLUCAP 119* 184* 181* 196* 172*   Lipid Profile: No results for input(s): CHOL, HDL, LDLCALC, TRIG, CHOLHDL, LDLDIRECT in the last 72 hours. Thyroid Function Tests: No results for input(s): TSH, T4TOTAL, FREET4, T3FREE, THYROIDAB in the last 72 hours. Anemia Panel: No results for input(s): VITAMINB12, FOLATE, FERRITIN, TIBC, IRON, RETICCTPCT in the last 72 hours. Sepsis Labs: No results for input(s): PROCALCITON, LATICACIDVEN in the last 168 hours.  No results found for this or any previous visit (from the past 240 hour(s)).       Radiology Studies: No results found.      Scheduled Meds: . clopidogrel  75 mg Oral Daily  . heparin  5,000 Units Subcutaneous Q8H  . hydrALAZINE  20 mg Oral Q8H  . hydroxychloroquine  200 mg Oral BID  . insulin aspart  0-15 Units Subcutaneous TID WC  . insulin aspart  0-5 Units Subcutaneous QHS  . isosorbide mononitrate  30 mg Oral Daily  . loratadine  10 mg Oral Daily  . metoprolol succinate  50 mg Oral Daily  . mometasone-formoterol  2 puff Inhalation BID  . polyethylene glycol  17 g Oral Daily  . potassium chloride  40 mEq Oral Daily  . simvastatin  20 mg Oral QHS  . sodium chloride flush  10-40 mL Intracatheter Q12H  . sodium chloride flush  3 mL Intravenous Q12H  . triamcinolone   Topical BID  . venlafaxine XR  150 mg Oral Daily   Continuous Infusions: . sodium chloride       LOS: 11 days    Time spent: 30 minutes    Eddie Koc Hoover Brunette, DO [facility] Pager  [phone]  If 7PM-7AM, please contact night-coverage [email or web address] Password [email or web address] 01/23/2018, 10:13 AM

## 2018-01-24 DIAGNOSIS — N183 Chronic kidney disease, stage 3 (moderate): Secondary | ICD-10-CM

## 2018-01-24 DIAGNOSIS — E1122 Type 2 diabetes mellitus with diabetic chronic kidney disease: Secondary | ICD-10-CM

## 2018-01-24 LAB — BASIC METABOLIC PANEL
Anion gap: 12 (ref 5–15)
BUN: 31 mg/dL — ABNORMAL HIGH (ref 6–20)
CO2: 24 mmol/L (ref 22–32)
Calcium: 9.2 mg/dL (ref 8.9–10.3)
Chloride: 102 mmol/L (ref 98–111)
Creatinine, Ser: 1.85 mg/dL — ABNORMAL HIGH (ref 0.44–1.00)
GFR calc Af Amer: 34 mL/min — ABNORMAL LOW (ref >60)
GFR calc non Af Amer: 30 mL/min — ABNORMAL LOW (ref >60)
Glucose, Bld: 182 mg/dL — ABNORMAL HIGH (ref 70–99)
Potassium: 4 mmol/L (ref 3.5–5.1)
Sodium: 138 mmol/L (ref 135–145)

## 2018-01-24 LAB — GLUCOSE, CAPILLARY
Glucose-Capillary: 163 mg/dL — ABNORMAL HIGH (ref 70–99)
Glucose-Capillary: 188 mg/dL — ABNORMAL HIGH (ref 70–99)

## 2018-01-24 MED ORDER — METOPROLOL SUCCINATE ER 50 MG PO TB24: 50.0000 mg | ORAL_TABLET | Freq: Every day | ORAL | 3 refills | Status: DC

## 2018-01-24 MED ORDER — POTASSIUM CHLORIDE CRYS ER 20 MEQ PO TBCR: 40.0000 meq | EXTENDED_RELEASE_TABLET | Freq: Every day | ORAL | 3 refills | Status: DC

## 2018-01-24 MED ORDER — HYDRALAZINE HCL 10 MG PO TABS: 20.0000 mg | ORAL_TABLET | Freq: Three times a day (TID) | ORAL | 3 refills | Status: DC

## 2018-01-24 MED ORDER — FUROSEMIDE 80 MG PO TABS: 80.0000 mg | ORAL_TABLET | Freq: Every day | ORAL | 11 refills | Status: DC

## 2018-01-24 MED ORDER — ISOSORBIDE MONONITRATE ER 30 MG PO TB24: 30.0000 mg | ORAL_TABLET | Freq: Every day | ORAL | 3 refills | Status: DC

## 2018-01-24 MED ORDER — MOMETASONE FURO-FORMOTEROL FUM 200-5 MCG/ACT IN AERO: 2.0000 | INHALATION_SPRAY | Freq: Two times a day (BID) | RESPIRATORY_TRACT | 3 refills | Status: AC

## 2018-01-24 NOTE — Discharge Summary (Signed)
Physician Discharge Summary  Katherine [name]:[id number] DOB: [birth year] DOA: 01/12/2018  PCP: Alvina Filbert, MD  Admit date: 01/12/2018  Discharge date: 01/24/2018  Admitted From: Home  Disposition: Home  Recommendations for Outpatient Follow-up:  1. Follow up with PCP in 3 to 5 days and recheck BMP in office to ensure renal stability on Lasix 2. Continue other medications as prescribed  Home Health: Yes with PT  Equipment/Devices: Home walker and bedside commode  Discharge Condition: Stable  CODE STATUS: Full  Diet recommendation: Heart Healthy/carb modified  Brief/Interim Summary: Per HPI: 58 year old female with a history of cardiomyopathy, low ejection fraction, lupus, chronic kidney disease stage III, hypertension and diabetes who presented to the hospital with worsening shortness of breath. She was found to be in decompensated CHF and admitted for IV diuresis.  She had required IV diuresis with Lasix infusion over the course of several days.  Her discharge weight is 120.5 kg and she has had a net cumulative fluid loss of well over 8 L of fluid.  She is feeling much better and was seen by PT with recommendations for skilled nursing facility/rehab on discharge, however patient would like to go home at this time with home health services and will be given a walker as well as bedside commode for assistance.  No other acute events noted during the course of this admission.  Echocardiogram while here demonstrating EF of 20 to 25% and patient did have some episodes of nonsustained ventricular tachycardia, but she cannot tolerate amiodarone.  These episodes are noted to be asymptomatic.  Discharge Diagnoses:  Active Problems:   Non-ischemic cardiomyopathy (HCC)   AICD (automatic cardioverter/defibrillator) present   Diabetes mellitus with stage 3 chronic kidney disease (HCC)   Dyslipidemia   Lupus (HCC)   Acute on chronic combined systolic and diastolic CHF (congestive  heart failure) (HCC)   Morbid obesity due to excess calories [facility])  Principal discharge diagnosis: Acute on chronic combined CHF.  Discharge Instructions  Discharge Instructions    DME Bedside commode   Complete by:  As directed    Patient needs a bedside commode to treat with the following condition:  CHF (congestive heart failure) (HCC)   Diet - low sodium heart healthy   Complete by:  As directed    Increase activity slowly   Complete by:  As directed      Allergies as of 01/24/2018      Reactions   Aspirin Other (See Comments)   Penicillins Itching   Can take amoxicillin without a reaction   Shellfish Allergy Other (See Comments)   Causes to have grand mal seizures      Medication List    STOP taking these medications   ADVAIR DISKUS 500-50 MCG/DOSE Aepb Generic drug:  Fluticasone-Salmeterol Replaced by:  mometasone-formoterol 200-5 MCG/ACT Aero   amiodarone 200 MG tablet Commonly known as:  PACERONE   cloNIDine 0.2 MG tablet Commonly known as:  CATAPRES   Fluticasone-Salmeterol 250-50 MCG/DOSE Aepb Commonly known as:  ADVAIR   lisinopril 5 MG tablet Commonly known as:  PRINIVIL,ZESTRIL   losartan 100 MG tablet Commonly known as:  COZAAR   spironolactone 25 MG tablet Commonly known as:  ALDACTONE     TAKE these medications   albuterol 108 (90 Base) MCG/ACT inhaler Commonly known as:  PROVENTIL HFA;VENTOLIN HFA Inhale 2 puffs into the lungs 4 (four) times daily as needed. For wheezing and shortness of breath   cetirizine 10 MG tablet Commonly known as:  ZYRTEC  Take 10 mg by mouth daily.   clopidogrel 75 MG tablet Commonly known as:  PLAVIX Take 1 tablet (75 mg total) by mouth daily.   COD LIVER OIL PO Take by mouth.   furosemide 80 MG tablet Commonly known as:  LASIX Take 1 tablet (80 mg total) by mouth daily. What changed:    medication strength  how much to take  when to take this   HUMALOG MIX 50/50 KWIKPEN (50-50) 100 UNIT/ML  Kwikpen Generic drug:  Insulin Lispro Prot & Lispro INJECT 30 UNITS INTO THE SKIN TWICE DAILY.   hydrALAZINE 10 MG tablet Commonly known as:  APRESOLINE Take 2 tablets (20 mg total) by mouth every 8 (eight) hours.   hydroxychloroquine 200 MG tablet Commonly known as:  PLAQUENIL Take 200 mg by mouth 2 (two) times daily.   isosorbide mononitrate 30 MG 24 hr tablet Commonly known as:  IMDUR Take 1 tablet (30 mg total) by mouth daily. Start taking on:  January 25, 2018   metoprolol succinate 50 MG 24 hr tablet Commonly known as:  TOPROL-XL Take 1 tablet (50 mg total) by mouth daily. Take with or immediately following a meal. Start taking on:  January 25, 2018 What changed:    medication strength  how much to take   mometasone-formoterol 200-5 MCG/ACT Aero Commonly known as:  DULERA Inhale 2 puffs into the lungs 2 (two) times daily. Replaces:  ADVAIR DISKUS 500-50 MCG/DOSE Aepb   MULTIVITAMIN WOMEN 50+ PO Take 1 tablet by mouth daily.   potassium chloride SA 20 MEQ tablet Commonly known as:  K-DUR,KLOR-CON Take 2 tablets (40 mEq total) by mouth daily. Start taking on:  January 25, 2018   simvastatin 20 MG tablet Commonly known as:  ZOCOR TAKE ONE TABLET BY MOUTH AT BEDTIME.   venlafaxine XR 150 MG 24 hr capsule Commonly known as:  EFFEXOR-XR Take 150 mg by mouth daily.            Durable Medical Equipment  (From admission, onward)         Start     Ordered   01/24/18 1016  For home use only DME Walker rolling  [facility])  Once    Question:  Patient needs a walker to treat with the following condition  Answer:  CHF (congestive heart failure) (HCC)   01/24/18 1017   01/24/18 0000  DME Bedside commode    Question:  Patient needs a bedside commode to treat with the following condition  Answer:  CHF (congestive heart failure) (HCC)   01/24/18 1017   01/17/18 1036  For home use only DME Cane  Once     01/17/18 1035   01/17/18 1036  For home use only DME 3 n 1  Once      01/17/18 1035   01/17/18 1034  For home use only DME standard manual wheelchair with seat cushion  Once    Comments:  Patient suffers from heart failure which impairs their ability to perform daily activities like ambulating in the home.  A walker will not resolve  issue with performing activities of daily living. A wheelchair will allow patient to safely perform daily activities. Patient can safely propel the wheelchair in the home or has a caregiver who can provide assistance.  Accessories: elevating leg rests (ELRs), wheel locks, extensions and anti-tippers.   01/17/18 1035         Follow-up Information    Alvina Filbert, MD Follow up in 3 day(s).  Specialty:  Internal Medicine Why:  Repeat BMP in office and ensure that renal function is ok. Contact information: [address] [place] [address] [phone]        Marinus Maw, MD .   Specialty:  Cardiology Contact information: [address] [place] [address] [phone]          Allergies  Allergen Reactions  . Aspirin Other (See Comments)  . Penicillins Itching    Can take amoxicillin without a reaction  . Shellfish Allergy Other (See Comments)    Causes to have grand mal seizures    Consultations:  Cardiology   Procedures/Studies: Dg Chest 2 View  Result Date: 01/12/2018 CLINICAL DATA:  Acute onset of shortness of breath and lower leg pain and swelling. EXAM: CHEST - 2 VIEW COMPARISON:  Chest radiograph performed 02/14/2011 FINDINGS: The lungs are hypoexpanded. Vascular congestion is seen. Hazy bilateral airspace opacities raise concern for pulmonary edema. A small left pleural effusion is noted. No pneumothorax is seen. The cardiomediastinal silhouette is mildly enlarged. A pacemaker/AICD is noted at the left chest wall, with leads ending at the right atrium and right ventricle. No acute osseous abnormalities are identified. IMPRESSION: Lungs hypoexpanded. Vascular congestion and mild  cardiomegaly. Hazy bilateral airspace opacities raise concern for pulmonary edema. Small left pleural effusion noted. Electronically Signed   By: Roanna Raider M.D.   On: 01/12/2018 04:59   US Venous Img Lower Unilateral Left  Result Date: 01/17/2018 CLINICAL DATA:  58 year old with left leg swelling. EXAM: LEFT LOWER EXTREMITY VENOUS DOPPLER ULTRASOUND TECHNIQUE: Gray-scale sonography with graded compression, as well as color Doppler and duplex ultrasound were performed to evaluate the lower extremity deep venous systems from the level of the common femoral vein and including the common femoral, femoral, profunda femoral, popliteal and calf veins including the posterior tibial, peroneal and gastrocnemius veins when visible. The superficial great saphenous vein was also interrogated. Spectral Doppler was utilized to evaluate flow at rest and with distal augmentation maneuvers in the common femoral, femoral and popliteal veins. COMPARISON:  None. FINDINGS: Contralateral Common Femoral Vein: Respiratory phasicity is normal and symmetric with the symptomatic side. No evidence of thrombus. Normal compressibility. Common Femoral Vein: No evidence of thrombus. Normal compressibility, respiratory phasicity and response to augmentation. Saphenofemoral Junction: No evidence of thrombus. Normal compressibility and flow on color Doppler imaging. Profunda Femoral Vein: No evidence of thrombus. Normal compressibility and flow on color Doppler imaging. Femoral Vein: No evidence of thrombus. Normal compressibility, respiratory phasicity and response to augmentation. Popliteal Vein: No evidence of thrombus. Normal compressibility, respiratory phasicity and response to augmentation. Calf Veins: Visualized left deep calf veins are patent without thrombus. Limited evaluation. Other Findings:  Subcutaneous edema. IMPRESSION: Negative for deep venous thrombosis in left lower extremity. Limited evaluation of the deep calf veins due  to the subcutaneous edema and body habitus. Electronically Signed   By: Richarda Overlie M.D.   On: 01/17/2018 10:13   Discharge Exam: Vitals:   01/24/18 0520 01/24/18 0623  BP: 124/81   Pulse: 87 85  Resp: 18 18  Temp: 97.8 F (36.6 C)   SpO2: 100% 96%   Vitals:   01/23/18 2136 01/24/18 0500 01/24/18 0520 01/24/18 0623  BP: 111/70  124/81   Pulse: 83  87 85  Resp:   18 18  Temp: (!) 97.4 F (36.3 C)  97.8 F (36.6 C)   TempSrc: Oral  Oral   SpO2: 100%  100% 96%  Weight:  120.5 kg    Height:        General: Pt is alert, awake, not in acute distress Cardiovascular: RRR, S1/S2 +, no rubs, no gallops Respiratory: CTA bilaterally, no wheezing, no rhonchi Abdominal: Soft, NT, ND, bowel sounds + Extremities: no edema, no cyanosis    The results of significant diagnostics from this hospitalization (including imaging, microbiology, ancillary and laboratory) are listed below for reference.     Microbiology: No results found for this or any previous visit (from the past 240 hour(s)).   Labs: BNP (last 3 results) Recent Labs    01/12/18 0410  BNP 2,922.0*   Basic Metabolic Panel: Recent Labs  Lab 01/19/18 0451 01/20/18 0458 01/21/18 [id number] 01/22/18 0636 01/23/18 0607 01/24/18 0648  NA 138 139 138 136 136 138  K 3.9 3.8 3.5 3.5 3.6 4.0  CL 101 103 100 96* 95* 102  CO2 30 27 26 28 30 24   GLUCOSE 188* 176* 145* 213* 201* 182*  BUN 22* 22* 22* 24* 29* 31*  CREATININE 1.42* 1.55* 1.48* 1.56* 1.86* 1.85*  CALCIUM 8.7* 8.7* 8.9 9.1 9.0 9.2  MG 1.8  --   --  2.0  --   --    Liver Function Tests: No results for input(s): AST, ALT, ALKPHOS, BILITOT, PROT, ALBUMIN in the last 168 hours. No results for input(s): LIPASE, AMYLASE in the last 168 hours. No results for input(s): AMMONIA in the last 168 hours. CBC: Recent Labs  Lab 01/18/18 0514  WBC 6.1  HGB 12.3  HCT 38.2  MCV 93.2  PLT 271   Cardiac Enzymes: No results for input(s): CKTOTAL, CKMB, CKMBINDEX, TROPONINI  in the last 168 hours. BNP: Invalid input(s): POCBNP CBG: Recent Labs  Lab 01/23/18 1149 01/23/18 1711 01/23/18 2137 01/23/18 2253 01/24/18 0718  GLUCAP 141* 143* 169* 169* 163*   D-Dimer No results for input(s): DDIMER in the last 72 hours. Hgb A1c No results for input(s): HGBA1C in the last 72 hours. Lipid Profile No results for input(s): CHOL, HDL, LDLCALC, TRIG, CHOLHDL, LDLDIRECT in the last 72 hours. Thyroid function studies No results for input(s): TSH, T4TOTAL, T3FREE, THYROIDAB in the last 72 hours.  Invalid input(s): FREET3 Anemia work up No results for input(s): VITAMINB12, FOLATE, FERRITIN, TIBC, IRON, RETICCTPCT in the last 72 hours. Urinalysis No results found for: COLORURINE, APPEARANCEUR, LABSPEC, PHURINE, GLUCOSEU, HGBUR, BILIRUBINUR, KETONESUR, PROTEINUR, UROBILINOGEN, NITRITE, LEUKOCYTESUR Sepsis Labs Invalid input(s): PROCALCITONIN,  WBC,  LACTICIDVEN Microbiology No results found for this or any previous visit (from the past 240 hour(s)).   Time coordinating discharge: 45 minutes  SIGNED:   Erick BlinksPratik D Shah, DO [facility] 01/24/2018, 10:17 AM Pager [phone]  If 7PM-7AM, please contact night-coverage [email or web address] Password [email or web address]

## 2018-01-24 NOTE — Progress Notes (Signed)
PICC line removed, WNL. Pressure dressing applied. Attending RN notified.

## 2018-01-24 NOTE — Progress Notes (Signed)
Unable to flush patients PICC line this am. Lab notified to receive am blood draws.

## 2018-01-24 NOTE — Care Management (Addendum)
Pt has mobility limitations that impair their ability to do one or more mobility-related activities of daily living in customary locations in the home. Patient cannot use crutches, cane, or walker to resolve the issue sufficiently, patient can safely self propel the wheelchair in the home or has a care giver that can assist. 

## 2018-01-24 NOTE — Plan of Care (Signed)

## 2018-01-24 NOTE — Care Management Note (Addendum)
Case Management Note  Patient Details  Name: Katherine [name] MRN: [id number] Date of Birth: [birth year]  Subjective/Objective:                    Action/Plan: Patient is electing to go home. Has been ambulatory in room. Will send referral to [facility] as previously arranged. DME ordered with [facility] ( [facility] and WC).   Referral for home health called and faxed to [facility].  Expected Discharge Date:  01/24/18               Expected Discharge Plan:  Home w Home Health Services  In-House Referral:  Clinical Social Work  Discharge planning Services  CM Consult  Post Acute Care Choice:  Durable Medical Equipment, Home Health Choice offered to:  Patient  DME Arranged:  Gilmer Mor, Government social research officer, 3-N-1 DME Agency:  [facility]  HH Arranged:  RN, PT HH Agency:  [facility]  Status of Service:  In process, will continue to follow  If discussed at Long Length of Stay Meetings, dates discussed:    Additional Comments:  Katherine [name], Chrystine Oiler, RN 01/24/2018, 12:08 PM

## 2018-01-25 ENCOUNTER — Ambulatory Visit: Payer: Medicaid Other | Admitting: Student

## 2018-01-25 NOTE — Progress Notes (Deleted)
Cardiology Office Note    Date:  01/25/2018   ID:  Katherine [name], DOB 1960/07/02, MRN [id number]  PCP:  Alvina Filbert, MD  Cardiologist: Lewayne Bunting, MD    No chief complaint on file.   History of Present Illness:    Katherine [name] is a 58 y.o. female with past medical history of chronic combined CHF, NICM (normal cors 2012, cMRI 2012 without infiltrative disease), asthma, fibromyalgia, SLE, Stage 3 CKD, HTN, HLD, seizures, morbid obesity, Type 2 DM, stroke, ventricular tachycardia with ICD in place (recurrent VT in 08/2017 with low-dose Amio initiated but patient self-discontinued due to GI side-effects) who presents to the office today for hospital follow-up.   Recently admitted to [facility] on 01/12/2018 for evaluation of worsening dyspnea and lower extremity edema.  Weight had increased by over 20 pounds since her last office visit and was elevated at 310 LBS upon admission.  She diuresed over 8 L during admission and discharge weight was 120.5 kg (doubt accuracy of this).  She was evaluated by PT during admission with recommendations of SNF at the time of discharge but she preferred to go home with home health services. Was discharged on Lasix 80 mg daily.   Past Medical History:  Diagnosis Date  . Anxiety   . Arthritis   . Asthma   . Bronchitis   . CHF (congestive heart failure) (HCC)    a. EF 20-25% by cath in 11/2010 with cath showing normal cors  . CKD (chronic kidney disease) stage 3, GFR 30-59 ml/min (HCC)   . Fibromyalgia   . GERD (gastroesophageal reflux disease)   . Hallux valgus of left foot   . Hay fever   . Headache(784.0)   . History of pneumonia   . History of seizures   . Hyperlipidemia   . Hypertension   . Nonischemic cardiomyopathy (HCC)   . Obesity   . Stroke (HCC)   . Systemic lupus erythematosus (HCC)   . Type 2 diabetes mellitus (HCC)     Past Surgical History:  Procedure Laterality Date  . ICD  02/13/2011   Medtronic Protecta DR XT   . ICD GENERATOR CHANGEOUT N/A 01/07/2017   Procedure: ICD GENERATOR CHANGEOUT;  Surgeon: Marinus Maw, MD;  Location: [facility] INVASIVE CV LAB;  Service: Cardiovascular;  Laterality: N/A;  . IMPLANTABLE CARDIOVERTER DEFIBRILLATOR IMPLANT N/A 02/13/2011   Procedure: IMPLANTABLE CARDIOVERTER DEFIBRILLATOR IMPLANT;  Surgeon: Thurmon Fair, MD;  Location: [facility] CATH LAB;  Service: Cardiovascular;  Laterality: N/A;  . LEFT HEART CATHETERIZATION WITH CORONARY ANGIOGRAM N/A 12/05/2010   Procedure: LEFT HEART CATHETERIZATION WITH CORONARY ANGIOGRAM;  Surgeon: Chrystie Nose, MD;  Location: [facility] CATH LAB;  Service: Cardiovascular;  Laterality: N/A;  . RIGHT ANKLE    . TUBAL LIGATION    . US ECHOCARDIOGRAPHY  11/09/2010   mild LV enlargement w/conc. LVH & severe global hypokinesis EF 30-35%,mod. diastolic dysfunction, mod. TR,mild AI,mild to mod MR,mild PI    Current Medications: Outpatient Medications Prior to Visit  Medication Sig Dispense Refill  . albuterol (PROVENTIL HFA;VENTOLIN HFA) 108 (90 BASE) MCG/ACT inhaler Inhale 2 puffs into the lungs 4 (four) times daily as needed. For wheezing and shortness of breath    . cetirizine (ZYRTEC) 10 MG tablet Take 10 mg by mouth daily.     . clopidogrel (PLAVIX) 75 MG tablet Take 1 tablet (75 mg total) by mouth daily. 90 tablet 3  . COD LIVER OIL PO Take by mouth.    Marland Kitchen  furosemide (LASIX) 80 MG tablet Take 1 tablet (80 mg total) by mouth daily. 30 tablet 11  . HUMALOG MIX 50/50 KWIKPEN (50-50) 100 UNIT/ML Kwikpen INJECT 30 UNITS INTO THE SKIN TWICE DAILY. 15 mL 2  . hydrALAZINE (APRESOLINE) 10 MG tablet Take 2 tablets (20 mg total) by mouth every 8 (eight) hours. 180 tablet 3  . hydroxychloroquine (PLAQUENIL) 200 MG tablet Take 200 mg by mouth 2 (two) times daily.    . isosorbide mononitrate (IMDUR) 30 MG 24 hr tablet Take 1 tablet (30 mg total) by mouth daily. 30 tablet 3  . metoprolol succinate (TOPROL-XL) 50 MG 24 hr tablet Take 1 tablet (50 mg total) by mouth  daily. Take with or immediately following a meal. 30 tablet 3  . mometasone-formoterol (DULERA) 200-5 MCG/ACT AERO Inhale 2 puffs into the lungs 2 (two) times daily. 8.8 g 3  . Multiple Vitamins-Minerals (MULTIVITAMIN WOMEN 50+ PO) Take 1 tablet by mouth daily.    . potassium chloride SA (K-DUR,KLOR-CON) 20 MEQ tablet Take 2 tablets (40 mEq total) by mouth daily. 60 tablet 3  . simvastatin (ZOCOR) 20 MG tablet TAKE ONE TABLET BY MOUTH AT BEDTIME. 30 tablet 0  . venlafaxine XR (EFFEXOR-XR) 150 MG 24 hr capsule Take 150 mg by mouth daily.      No facility-administered medications prior to visit.      Allergies:   Aspirin; Penicillins; and Shellfish allergy   Social History   Socioeconomic History  . Marital status: Legally Separated    Spouse name: Not on file  . Number of children: Not on file  . Years of education: Not on file  . Highest education level: Not on file  Occupational History  . Not on file  Social Needs  . Financial resource strain: Not on file  . Food insecurity:    Worry: Not on file    Inability: Not on file  . Transportation needs:    Medical: Not on file    Non-medical: Not on file  Tobacco Use  . Smoking status: Former Smoker    Packs/day: 1.00    Years: 26.00    Pack years: 26.00    Types: Cigarettes    Start date: 07/01/1979    Last attempt to quit: 10/03/2005    Years since quitting: 12.3  . Smokeless tobacco: Never Used  Substance and Sexual Activity  . Alcohol use: Not Currently    Alcohol/week: 0.0 standard drinks    Comment: quit 2007  . Drug use: No  . Sexual activity: Yes  Lifestyle  . Physical activity:    Days per week: Not on file    Minutes per session: Not on file  . Stress: Not on file  Relationships  . Social connections:    Talks on phone: Not on file    Gets together: Not on file    Attends religious service: Not on file    Active member of club or organization: Not on file    Attends meetings of clubs or organizations: Not  on file    Relationship status: Not on file  Other Topics Concern  . Not on file  Social History Narrative  . Not on file     Family History:  The patient's ***family history includes Hypertension in her brother, daughter, and sister.   Review of Systems:   Please see the history of present illness.     General:  No chills, fever, night sweats or weight changes.  Cardiovascular:  No  chest pain, dyspnea on exertion, edema, orthopnea, palpitations, paroxysmal nocturnal dyspnea. Dermatological: No rash, lesions/masses Respiratory: No cough, dyspnea Urologic: No hematuria, dysuria Abdominal:   No nausea, vomiting, diarrhea, bright red blood per rectum, melena, or hematemesis Neurologic:  No visual changes, wkns, changes in mental status. All other systems reviewed and are otherwise negative except as noted above.   Physical Exam:    VS:  LMP 01/30/2011    General: Well developed, well nourished,female appearing in no acute distress. Head: Normocephalic, atraumatic, sclera non-icteric, no xanthomas, nares are without discharge.  Neck: No carotid bruits. JVD not elevated.  Lungs: Respirations regular and unlabored, without wheezes or rales.  Heart: ***Regular rate and rhythm. No S3 or S4.  No murmur, no rubs, or gallops appreciated. Abdomen: Soft, non-tender, non-distended with normoactive bowel sounds. No hepatomegaly. No rebound/guarding. No obvious abdominal masses. Msk:  Strength and tone appear normal for age. No joint deformities or effusions. Extremities: No clubbing or cyanosis. No edema.  Distal pedal pulses are 2+ bilaterally. Neuro: Alert and oriented X 3. Moves all extremities spontaneously. No focal deficits noted. Psych:  Responds to questions appropriately with a normal affect. Skin: No rashes or lesions noted  Wt Readings from Last 3 Encounters:  01/24/18 265 lb 10.5 oz (120.5 kg)  12/10/17 291 lb 6.4 oz (132.2 kg)  08/30/17 276 lb (125.2 kg)         Studies/Labs Reviewed:   EKG:  EKG is*** ordered today.  The ekg ordered today demonstrates ***  Recent Labs: 01/12/2018: ALT 38; B Natriuretic Peptide 2,922.0 01/18/2018: Hemoglobin 12.3; Platelets 271 01/22/2018: Magnesium 2.0 01/24/2018: BUN 31; Creatinine, Ser 1.85; Potassium 4.0; Sodium 138   Lipid Panel No results found for: CHOL, TRIG, HDL, CHOLHDL, VLDL, LDLCALC, LDLDIRECT  Additional studies/ records that were reviewed today include:   Echocardiogram: 01/13/2018 Study Conclusions  - Left ventricle: The cavity size was moderately dilated. Wall   thickness was increased in a pattern of mild LVH. Systolic   function was severely reduced. The estimated ejection fraction   was in the range of 20% to 25%. Diffuse hypokinesis. Features are   consistent with a pseudonormal left ventricular filling pattern,   with concomitant abnormal relaxation and increased filling   pressure (grade 2 diastolic dysfunction). - Ventricular septum: Septal motion showed abnormal function and   dyssynergy. - Aortic valve: Mildly calcified annulus. Trileaflet; mildly   calcified leaflets. There was mild regurgitation. - Mitral valve: Mildly calcified annulus. There was mild   regurgitation. - Left atrium: The atrium was mildly dilated. - Right ventricle: The cavity size was mildly dilated. Pacer wire   or catheter noted in right ventricle. Systolic function was   mildly reduced. - Right atrium: The atrium was mildly dilated. - Tricuspid valve: There was mild regurgitation. - Pulmonary arteries: Systolic pressure was moderately increased.   PA peak pressure: 46 mm Hg (S). - Pericardium, extracardiac: A trivial pericardial effusion was   identified.  Assessment:    No diagnosis found.   Plan:   In order of problems listed above:  1. ***    Medication Adjustments/Labs and Tests Ordered: Current medicines are reviewed at length with the patient today.  Concerns regarding  medicines are outlined above.  Medication changes, Labs and Tests ordered today are listed in the Patient Instructions below. There are no Patient Instructions on file for this visit.   Signed, Ellsworth LennoxBrittany M Zebediah Beezley, PA-C  01/25/2018 7:28 AM     [facility] [address] [place], [place] [address] Phone: [phone]

## 2018-01-26 ENCOUNTER — Encounter: Payer: Self-pay | Admitting: Student

## 2018-03-02 ENCOUNTER — Encounter: Payer: Medicaid Other | Admitting: Internal Medicine

## 2018-03-14 ENCOUNTER — Ambulatory Visit (INDEPENDENT_AMBULATORY_CARE_PROVIDER_SITE_OTHER): Payer: Medicaid Other

## 2018-03-14 ENCOUNTER — Telehealth: Payer: Self-pay | Admitting: *Deleted

## 2018-03-14 DIAGNOSIS — I428 Other cardiomyopathies: Secondary | ICD-10-CM | POA: Diagnosis not present

## 2018-03-14 LAB — CUP PACEART INCLINIC DEVICE CHECK
Date Time Interrogation Session: 20200224172024
Implantable Lead Implant Date: 20130125
Implantable Lead Location: 753859
Implantable Lead Location: 753860
Implantable Lead Model: 5076
Implantable Lead Model: 6947
Implantable Pulse Generator Implant Date: 20181220
MDC IDC LEAD IMPLANT DT: 20130125

## 2018-03-14 NOTE — Progress Notes (Signed)
Pt seen d/t ICD alarming, alert tone for unsuccessful [facility] Alert transmission, pt had been shocked 17 times since Jan. 21, 2020 for appropriate VT/VF. Each shock successful. When asked about pts medications specifically Toprol - XL 50mg  pt stated that she was taken off that informed pt that a new Rx had been sent in on 01/24/2018 w/ three refills. Pt then stated that she was taking her medications. Informed pt of importance of compliance with medications in order to keep her heart in rhythm. Reviewed with JA who recommended to review with GT tomorrow. Informed pt of driving restrictions x 6 months.

## 2018-03-14 NOTE — Patient Instructions (Signed)
NO DRIVING FOR 6 MONTHS  TAKE ALL MEDICATIONS AS PRESCRIBED

## 2018-03-14 NOTE — Telephone Encounter (Signed)
Pt states her defibrillator keeps going off for no reason, I spoke with Britta Mccreedy in [employer], will transfer patient to her.

## 2018-03-14 NOTE — Telephone Encounter (Signed)
Patient stated that she might have gotten shocked last week. She stated that she hears an alarm at least once a week. Attempted to help pt send manual transmission w/ her home monitor but her monitor will not work due to no cellular signal. Patient said she would try to get to the office by 11:30 today.

## 2018-03-15 ENCOUNTER — Telehealth: Payer: Self-pay

## 2018-03-15 ENCOUNTER — Other Ambulatory Visit (HOSPITAL_COMMUNITY): Payer: Self-pay | Admitting: Internal Medicine

## 2018-03-15 DIAGNOSIS — Z1231 Encounter for screening mammogram for malignant neoplasm of breast: Secondary | ICD-10-CM

## 2018-03-15 LAB — BASIC METABOLIC PANEL
BUN / CREAT RATIO: 14 (ref 9–23)
BUN: 27 mg/dL — ABNORMAL HIGH (ref 6–24)
CHLORIDE: 104 mmol/L (ref 96–106)
CO2: 22 mmol/L (ref 20–29)
Calcium: 7.7 mg/dL — ABNORMAL LOW (ref 8.7–10.2)
Creatinine, Ser: 1.94 mg/dL — ABNORMAL HIGH (ref 0.57–1.00)
GFR calc non Af Amer: 28 mL/min/{1.73_m2} — ABNORMAL LOW (ref >59)
GFR, EST AFRICAN AMERICAN: 32 mL/min/{1.73_m2} — AB (ref >59)
Glucose: 121 mg/dL — ABNORMAL HIGH (ref 65–99)
Potassium: 2.6 mmol/L — CL (ref 3.5–5.2)
Sodium: 143 mmol/L (ref 134–144)

## 2018-03-15 LAB — MAGNESIUM: Magnesium: 1.5 mg/dL — ABNORMAL LOW (ref 1.6–2.3)

## 2018-03-15 MED ORDER — MAGNESIUM 400 MG PO TABS: 400.0000 mg | ORAL_TABLET | Freq: Every day | ORAL | 0 refills | Status: DC

## 2018-03-15 NOTE — Telephone Encounter (Signed)
After speaking with patients pharmacy pt has a Rx for Amiodarone 200mg  daily that pt has not picked up or been filled, advised pharmacy to fill this and pt should be by to pick up today. Pharmacist also stated that pt had a Rx for Potassium but that pt should of had it refilled by now if pt was taking two twice daily. Also sent in a Rx for Magnesium 400mg  daily for pt to pick up.

## 2018-03-15 NOTE — Telephone Encounter (Signed)
Spoke with pt,asked her if she was taking her Potassium 40 MEQ pt stated that she was only taking one potassium a day advised pt to start taking two potassium daily due to her potassium level being critically low, advised pt that she should be taking Amiodarone 200mg  daily and Magnesium 400mg  advised pt that I would send this into her pharmacy. Pt agreeable to apt to see [name] on 03/22/2018 at 9:15am.

## 2018-03-15 NOTE — Telephone Encounter (Signed)
LVM for pt to call device clinic back ASAP regarding GT recommendations for restart of Amiodarone due to pt being shocked 17 times by ICD and critical lab values and pt needs to see GT on 03/21/2017 at 9:15am. Apt has been scheduled.

## 2018-03-18 ENCOUNTER — Other Ambulatory Visit: Payer: Self-pay

## 2018-03-19 ENCOUNTER — Inpatient Hospital Stay: Payer: Medicaid Other

## 2018-03-19 ENCOUNTER — Emergency Department: Payer: Medicaid Other

## 2018-03-19 ENCOUNTER — Inpatient Hospital Stay
Admission: EM | Admit: 2018-03-19 | Discharge: 2018-03-24 | DRG: 291 | Disposition: A | Discharge status: Other Acute Inpatient Hospital | Payer: Medicaid Other | Attending: Internal Medicine | Admitting: Internal Medicine

## 2018-03-19 ENCOUNTER — Other Ambulatory Visit: Payer: Self-pay

## 2018-03-19 ENCOUNTER — Encounter: Payer: Self-pay | Admitting: Emergency Medicine

## 2018-03-19 DIAGNOSIS — I509 Heart failure, unspecified: Secondary | ICD-10-CM

## 2018-03-19 DIAGNOSIS — D649 Anemia, unspecified: Secondary | ICD-10-CM | POA: Diagnosis present

## 2018-03-19 DIAGNOSIS — N179 Acute kidney failure, unspecified: Secondary | ICD-10-CM | POA: Diagnosis present

## 2018-03-19 DIAGNOSIS — I472 Ventricular tachycardia: Secondary | ICD-10-CM | POA: Diagnosis present

## 2018-03-19 DIAGNOSIS — K219 Gastro-esophageal reflux disease without esophagitis: Secondary | ICD-10-CM | POA: Diagnosis present

## 2018-03-19 DIAGNOSIS — Z6836 Body mass index (BMI) 36.0-36.9, adult: Secondary | ICD-10-CM | POA: Diagnosis not present

## 2018-03-19 DIAGNOSIS — M329 Systemic lupus erythematosus, unspecified: Secondary | ICD-10-CM | POA: Diagnosis present

## 2018-03-19 DIAGNOSIS — E1122 Type 2 diabetes mellitus with diabetic chronic kidney disease: Secondary | ICD-10-CM | POA: Diagnosis present

## 2018-03-19 DIAGNOSIS — R17 Unspecified jaundice: Secondary | ICD-10-CM

## 2018-03-19 DIAGNOSIS — J449 Chronic obstructive pulmonary disease, unspecified: Secondary | ICD-10-CM | POA: Diagnosis present

## 2018-03-19 DIAGNOSIS — Z8249 Family history of ischemic heart disease and other diseases of the circulatory system: Secondary | ICD-10-CM | POA: Diagnosis not present

## 2018-03-19 DIAGNOSIS — N189 Chronic kidney disease, unspecified: Secondary | ICD-10-CM | POA: Diagnosis not present

## 2018-03-19 DIAGNOSIS — Z794 Long term (current) use of insulin: Secondary | ICD-10-CM | POA: Diagnosis not present

## 2018-03-19 DIAGNOSIS — Z79899 Other long term (current) drug therapy: Secondary | ICD-10-CM | POA: Diagnosis not present

## 2018-03-19 DIAGNOSIS — I428 Other cardiomyopathies: Secondary | ICD-10-CM | POA: Diagnosis not present

## 2018-03-19 DIAGNOSIS — Z7902 Long term (current) use of antithrombotics/antiplatelets: Secondary | ICD-10-CM | POA: Diagnosis not present

## 2018-03-19 DIAGNOSIS — Z7951 Long term (current) use of inhaled steroids: Secondary | ICD-10-CM

## 2018-03-19 DIAGNOSIS — I959 Hypotension, unspecified: Secondary | ICD-10-CM | POA: Diagnosis not present

## 2018-03-19 DIAGNOSIS — I69354 Hemiplegia and hemiparesis following cerebral infarction affecting left non-dominant side: Secondary | ICD-10-CM | POA: Diagnosis not present

## 2018-03-19 DIAGNOSIS — E785 Hyperlipidemia, unspecified: Secondary | ICD-10-CM | POA: Diagnosis present

## 2018-03-19 DIAGNOSIS — Z95810 Presence of automatic (implantable) cardiac defibrillator: Secondary | ICD-10-CM

## 2018-03-19 DIAGNOSIS — F329 Major depressive disorder, single episode, unspecified: Secondary | ICD-10-CM | POA: Diagnosis present

## 2018-03-19 DIAGNOSIS — M797 Fibromyalgia: Secondary | ICD-10-CM | POA: Diagnosis present

## 2018-03-19 DIAGNOSIS — N183 Chronic kidney disease, stage 3 (moderate): Secondary | ICD-10-CM | POA: Diagnosis present

## 2018-03-19 DIAGNOSIS — E876 Hypokalemia: Secondary | ICD-10-CM | POA: Diagnosis present

## 2018-03-19 DIAGNOSIS — G4730 Sleep apnea, unspecified: Secondary | ICD-10-CM | POA: Diagnosis present

## 2018-03-19 DIAGNOSIS — R51 Headache: Secondary | ICD-10-CM | POA: Diagnosis present

## 2018-03-19 DIAGNOSIS — Z87891 Personal history of nicotine dependence: Secondary | ICD-10-CM | POA: Diagnosis not present

## 2018-03-19 DIAGNOSIS — I5021 Acute systolic (congestive) heart failure: Secondary | ICD-10-CM

## 2018-03-19 DIAGNOSIS — R0602 Shortness of breath: Secondary | ICD-10-CM | POA: Diagnosis not present

## 2018-03-19 DIAGNOSIS — I5023 Acute on chronic systolic (congestive) heart failure: Secondary | ICD-10-CM | POA: Diagnosis present

## 2018-03-19 DIAGNOSIS — I130 Hypertensive heart and chronic kidney disease with heart failure and stage 1 through stage 4 chronic kidney disease, or unspecified chronic kidney disease: Principal | ICD-10-CM | POA: Diagnosis present

## 2018-03-19 DIAGNOSIS — Z833 Family history of diabetes mellitus: Secondary | ICD-10-CM

## 2018-03-19 LAB — RESPIRATORY PANEL BY PCR
Adenovirus: NOT DETECTED
Bordetella pertussis: NOT DETECTED
Chlamydophila pneumoniae: NOT DETECTED
Coronavirus 229E: NOT DETECTED
Coronavirus HKU1: NOT DETECTED
Coronavirus NL63: NOT DETECTED
Coronavirus OC43: NOT DETECTED
INFLUENZA A-RVPPCR: NOT DETECTED
Influenza B: NOT DETECTED
MYCOPLASMA PNEUMONIAE-RVPPCR: NOT DETECTED
Metapneumovirus: NOT DETECTED
Parainfluenza Virus 1: NOT DETECTED
Parainfluenza Virus 2: NOT DETECTED
Parainfluenza Virus 3: NOT DETECTED
Parainfluenza Virus 4: NOT DETECTED
Respiratory Syncytial Virus: NOT DETECTED
Rhinovirus / Enterovirus: NOT DETECTED

## 2018-03-19 LAB — COMPREHENSIVE METABOLIC PANEL
ALT: 29 U/L (ref 0–44)
AST: 38 U/L (ref 15–41)
Albumin: 2.9 g/dL — ABNORMAL LOW (ref 3.5–5.0)
Alkaline Phosphatase: 134 U/L — ABNORMAL HIGH (ref 38–126)
Anion gap: 12 (ref 5–15)
BILIRUBIN TOTAL: 3.1 mg/dL — AB (ref 0.3–1.2)
BUN: 23 mg/dL — ABNORMAL HIGH (ref 6–20)
CO2: 24 mmol/L (ref 22–32)
CREATININE: 1.14 mg/dL — AB (ref 0.44–1.00)
Calcium: 8.5 mg/dL — ABNORMAL LOW (ref 8.9–10.3)
Chloride: 105 mmol/L (ref 98–111)
GFR calc Af Amer: >60 mL/min (ref >60)
GFR calc non Af Amer: 53 mL/min — ABNORMAL LOW (ref >60)
Glucose, Bld: 72 mg/dL (ref 70–99)
Potassium: 2.3 mmol/L — CL (ref 3.5–5.1)
Sodium: 141 mmol/L (ref 135–145)
Total Protein: 7.4 g/dL (ref 6.5–8.1)

## 2018-03-19 LAB — CBC
HCT: 41.4 % (ref 36.0–46.0)
HEMOGLOBIN: 13.4 g/dL (ref 12.0–15.0)
MCH: 30.6 pg (ref 26.0–34.0)
MCHC: 32.4 g/dL (ref 30.0–36.0)
MCV: 94.5 fL (ref 80.0–100.0)
Platelets: 251 10*3/uL (ref 150–400)
RBC: 4.38 MIL/uL (ref 3.87–5.11)
RDW: 20.1 % — ABNORMAL HIGH (ref 11.5–15.5)
WBC: 5.2 10*3/uL (ref 4.0–10.5)
nRBC: 2.1 % — ABNORMAL HIGH (ref 0.0–0.2)

## 2018-03-19 LAB — GLUCOSE, CAPILLARY
Glucose-Capillary: 66 mg/dL — ABNORMAL LOW (ref 70–99)
Glucose-Capillary: 89 mg/dL (ref 70–99)
Glucose-Capillary: 149 mg/dL — ABNORMAL HIGH (ref 70–99)
Glucose-Capillary: 116 mg/dL — ABNORMAL HIGH (ref 70–99)
Glucose-Capillary: 176 mg/dL — ABNORMAL HIGH (ref 70–99)
Glucose-Capillary: 215 mg/dL — ABNORMAL HIGH (ref 70–99)

## 2018-03-19 LAB — TROPONIN I: Troponin I: <0.03 ng/mL (ref <0.03)

## 2018-03-19 LAB — MAGNESIUM: Magnesium: 1.9 mg/dL (ref 1.7–2.4)

## 2018-03-19 LAB — BRAIN NATRIURETIC PEPTIDE: B Natriuretic Peptide: >4500 pg/mL — ABNORMAL HIGH (ref 0.0–100.0)

## 2018-03-19 LAB — INFLUENZA PANEL BY PCR (TYPE A & B)
Influenza A By PCR: NEGATIVE
Influenza B By PCR: NEGATIVE

## 2018-03-19 LAB — LIPASE, BLOOD: LIPASE: 28 U/L (ref 11–51)

## 2018-03-19 MED ORDER — SODIUM CHLORIDE 0.9% FLUSH
3.0000 mL | Freq: Two times a day (BID) | INTRAVENOUS | Status: DC
  Administered 2018-03-19 – 2018-03-23 (×9): 3 mL via INTRAVENOUS

## 2018-03-19 MED ORDER — VENLAFAXINE HCL ER 75 MG PO CP24
150.0000 mg | ORAL_CAPSULE | Freq: Every day | ORAL | Status: DC
  Administered 2018-03-19 – 2018-03-24 (×6): 150 mg via ORAL
  Filled 2018-03-19 (×3): qty 2
  Filled 2018-03-19: qty 1
  Filled 2018-03-19 (×3): qty 2

## 2018-03-19 MED ORDER — POTASSIUM CHLORIDE CRYS ER 20 MEQ PO TBCR
40.0000 meq | EXTENDED_RELEASE_TABLET | ORAL | Status: AC
  Administered 2018-03-19: 40 meq via ORAL
  Filled 2018-03-19: qty 2

## 2018-03-19 MED ORDER — SODIUM CHLORIDE 0.9 % IV SOLN: 250.0000 mL | INTRAVENOUS | Status: DC | PRN

## 2018-03-19 MED ORDER — AMIODARONE HCL 200 MG PO TABS
200.0000 mg | ORAL_TABLET | Freq: Every day | ORAL | Status: DC
  Administered 2018-03-19 – 2018-03-20 (×2): 200 mg via ORAL
  Filled 2018-03-19 (×2): qty 1

## 2018-03-19 MED ORDER — INSULIN ASPART 100 UNIT/ML ~~LOC~~ SOLN
0.0000 [IU] | Freq: Every day | SUBCUTANEOUS | Status: DC
  Administered 2018-03-19: 2 [IU] via SUBCUTANEOUS
  Filled 2018-03-19: qty 1

## 2018-03-19 MED ORDER — POTASSIUM CHLORIDE 20 MEQ PO PACK
40.0000 meq | PACK | Freq: Two times a day (BID) | ORAL | Status: DC
  Administered 2018-03-19 – 2018-03-20 (×3): 40 meq via ORAL
  Filled 2018-03-19 (×3): qty 2

## 2018-03-19 MED ORDER — INSULIN ASPART PROT & ASPART (70-30 MIX) 100 UNIT/ML ~~LOC~~ SUSP
10.0000 [IU] | Freq: Two times a day (BID) | SUBCUTANEOUS | Status: DC
  Administered 2018-03-19 – 2018-03-22 (×6): 10 [IU] via SUBCUTANEOUS
  Filled 2018-03-19 (×7): qty 10

## 2018-03-19 MED ORDER — SODIUM CHLORIDE 0.9% FLUSH: 3.0000 mL | INTRAVENOUS | Status: DC | PRN

## 2018-03-19 MED ORDER — INSULIN ASPART 100 UNIT/ML ~~LOC~~ SOLN
0.0000 [IU] | Freq: Three times a day (TID) | SUBCUTANEOUS | Status: DC
  Administered 2018-03-19 – 2018-03-24 (×6): 1 [IU] via SUBCUTANEOUS
  Filled 2018-03-19 (×6): qty 1

## 2018-03-19 MED ORDER — LISINOPRIL 5 MG PO TABS
5.0000 mg | ORAL_TABLET | Freq: Every day | ORAL | Status: DC
  Administered 2018-03-19 – 2018-03-21 (×3): 5 mg via ORAL
  Filled 2018-03-19 (×3): qty 1

## 2018-03-19 MED ORDER — ALBUTEROL SULFATE HFA 108 (90 BASE) MCG/ACT IN AERS: 2.0000 | INHALATION_SPRAY | Freq: Four times a day (QID) | RESPIRATORY_TRACT | Status: DC | PRN

## 2018-03-19 MED ORDER — ISOSORBIDE MONONITRATE ER 30 MG PO TB24
30.0000 mg | ORAL_TABLET | Freq: Every day | ORAL | Status: DC
  Administered 2018-03-19 – 2018-03-23 (×4): 30 mg via ORAL
  Filled 2018-03-19 (×4): qty 1

## 2018-03-19 MED ORDER — ENOXAPARIN SODIUM 40 MG/0.4ML ~~LOC~~ SOLN
40.0000 mg | SUBCUTANEOUS | Status: DC
  Administered 2018-03-19 – 2018-03-23 (×5): 40 mg via SUBCUTANEOUS
  Filled 2018-03-19 (×5): qty 0.4

## 2018-03-19 MED ORDER — SODIUM CHLORIDE 0.9% FLUSH: 3.0000 mL | Freq: Once | INTRAVENOUS | Status: DC

## 2018-03-19 MED ORDER — POLYETHYLENE GLYCOL 3350 17 G PO PACK
17.0000 g | PACK | Freq: Every day | ORAL | Status: DC | PRN
  Filled 2018-03-19: qty 1

## 2018-03-19 MED ORDER — HYDROXYCHLOROQUINE SULFATE 200 MG PO TABS
200.0000 mg | ORAL_TABLET | Freq: Two times a day (BID) | ORAL | Status: DC
  Administered 2018-03-19 – 2018-03-24 (×11): 200 mg via ORAL
  Filled 2018-03-19 (×12): qty 1

## 2018-03-19 MED ORDER — ACETAMINOPHEN 325 MG PO TABS
650.0000 mg | ORAL_TABLET | ORAL | Status: DC | PRN
  Administered 2018-03-22: 650 mg via ORAL
  Filled 2018-03-19: qty 2

## 2018-03-19 MED ORDER — ALBUTEROL SULFATE (2.5 MG/3ML) 0.083% IN NEBU: 2.5000 mg | INHALATION_SOLUTION | Freq: Four times a day (QID) | RESPIRATORY_TRACT | Status: DC | PRN

## 2018-03-19 MED ORDER — CARVEDILOL 6.25 MG PO TABS
6.2500 mg | ORAL_TABLET | Freq: Two times a day (BID) | ORAL | Status: DC
  Administered 2018-03-19 – 2018-03-21 (×4): 6.25 mg via ORAL
  Filled 2018-03-19 (×4): qty 1

## 2018-03-19 MED ORDER — CLOPIDOGREL BISULFATE 75 MG PO TABS
75.0000 mg | ORAL_TABLET | Freq: Every day | ORAL | Status: DC
  Administered 2018-03-19 – 2018-03-24 (×6): 75 mg via ORAL
  Filled 2018-03-19 (×6): qty 1

## 2018-03-19 MED ORDER — ADULT MULTIVITAMIN W/MINERALS CH
1.0000 | ORAL_TABLET | Freq: Every day | ORAL | Status: DC
  Administered 2018-03-19 – 2018-03-24 (×6): 1 via ORAL
  Filled 2018-03-19 (×7): qty 1

## 2018-03-19 MED ORDER — INSULIN LISPRO PROT & LISPRO (50-50 MIX) 100 UNIT/ML KWIKPEN: 30.0000 [IU] | PEN_INJECTOR | Freq: Two times a day (BID) | SUBCUTANEOUS | Status: DC

## 2018-03-19 MED ORDER — MOMETASONE FURO-FORMOTEROL FUM 200-5 MCG/ACT IN AERO
2.0000 | INHALATION_SPRAY | Freq: Two times a day (BID) | RESPIRATORY_TRACT | Status: DC
  Administered 2018-03-19 – 2018-03-24 (×11): 2 via RESPIRATORY_TRACT
  Filled 2018-03-19: qty 8.8

## 2018-03-19 MED ORDER — FUROSEMIDE 10 MG/ML IJ SOLN
40.0000 mg | Freq: Two times a day (BID) | INTRAMUSCULAR | Status: DC
  Administered 2018-03-19 – 2018-03-20 (×2): 40 mg via INTRAVENOUS
  Filled 2018-03-19 (×2): qty 4

## 2018-03-19 MED ORDER — MULTIVITAMIN WOMEN 50+ PO TABS: 1.0000 | ORAL_TABLET | Freq: Every day | ORAL | Status: DC

## 2018-03-19 MED ORDER — HYDRALAZINE HCL 10 MG PO TABS
20.0000 mg | ORAL_TABLET | Freq: Three times a day (TID) | ORAL | Status: DC
  Administered 2018-03-19 – 2018-03-21 (×7): 20 mg via ORAL
  Filled 2018-03-19 (×11): qty 2

## 2018-03-19 MED ORDER — FUROSEMIDE 10 MG/ML IJ SOLN
40.0000 mg | Freq: Once | INTRAMUSCULAR | Status: AC
  Administered 2018-03-19: 40 mg via INTRAVENOUS
  Filled 2018-03-19: qty 4

## 2018-03-19 MED ORDER — OXYCODONE HCL 5 MG PO TABS
5.0000 mg | ORAL_TABLET | Freq: Four times a day (QID) | ORAL | Status: DC | PRN
  Administered 2018-03-20 – 2018-03-23 (×4): 5 mg via ORAL
  Filled 2018-03-19 (×4): qty 1

## 2018-03-19 MED ORDER — ONDANSETRON HCL 4 MG/2ML IJ SOLN
4.0000 mg | Freq: Four times a day (QID) | INTRAMUSCULAR | Status: DC | PRN
  Administered 2018-03-20 – 2018-03-24 (×6): 4 mg via INTRAVENOUS
  Filled 2018-03-19 (×6): qty 2

## 2018-03-19 MED ORDER — OXYCODONE-ACETAMINOPHEN 5-325 MG PO TABS
1.0000 | ORAL_TABLET | Freq: Four times a day (QID) | ORAL | Status: DC | PRN
  Administered 2018-03-19 – 2018-03-24 (×6): 1 via ORAL
  Filled 2018-03-19 (×6): qty 1

## 2018-03-19 MED ORDER — POTASSIUM CHLORIDE CRYS ER 20 MEQ PO TBCR: 40.0000 meq | EXTENDED_RELEASE_TABLET | Freq: Two times a day (BID) | ORAL | Status: DC

## 2018-03-19 MED ORDER — MAGNESIUM SULFATE 2 GM/50ML IV SOLN
2.0000 g | Freq: Once | INTRAVENOUS | Status: AC
  Administered 2018-03-19: 2 g via INTRAVENOUS
  Filled 2018-03-19: qty 50

## 2018-03-19 MED ORDER — LORATADINE 10 MG PO TABS
10.0000 mg | ORAL_TABLET | Freq: Every day | ORAL | Status: DC
  Administered 2018-03-19 – 2018-03-24 (×6): 10 mg via ORAL
  Filled 2018-03-19 (×6): qty 1

## 2018-03-19 NOTE — ED Notes (Signed)
ED TO INPATIENT HANDOFF REPORT  ED Nurse Name and Phone #: [phone]   S Name/Age/Gender Katherine [name] 58 y.o. female Room/Bed: ED19A/ED19A  Code Status   Code Status: Full Code  Home/SNF/Other Home Patient oriented to: a/o x4 Is this baseline? Yes   Triage Complete: Triage complete  Chief Complaint [facility] Weakness  Triage Note [facility] pt to Rm 19 from home with report of weakness for several months. Worse over the last few days. Increased shob and wheezing when she lays down and becomes bradycardic.    Allergies Allergies  Allergen Reactions  . Aspirin Other (See Comments)  . Penicillins Itching    Can take amoxicillin without a reaction  . Shellfish Allergy Other (See Comments)    Causes to have grand mal seizures    Level of Care/Admitting Diagnosis ED Disposition    ED Disposition Condition Comment   Admit  Hospital Area: [facility] [[id number]]  Level of Care: Telemetry [5]  Diagnosis: CHF (congestive heart failure) [facility]) [[id number]]  Admitting Physician: Alford Highland [[id number]]  Attending Physician: Alford Highland [phone]  Estimated length of stay: past midnight tomorrow  Certification:: I certify this patient will need inpatient services for at least 2 midnights  PT Class (Do Not Modify): Inpatient [101]  PT Acc Code (Do Not Modify): Private [1]       B Medical/Surgery History Past Medical History:  Diagnosis Date  . Anxiety   . Arthritis   . Asthma   . Bronchitis   . CHF (congestive heart failure) (HCC)    a. EF 20-25% by cath in 11/2010 with cath showing normal cors  . CKD (chronic kidney disease) stage 3, GFR 30-59 ml/min (HCC)   . Fibromyalgia   . GERD (gastroesophageal reflux disease)   . Hallux valgus of left foot   . Hay fever   . Headache(784.0)   . History of pneumonia   . History of seizures   . Hyperlipidemia   . Hypertension   . Nonischemic cardiomyopathy (HCC)   . Obesity   . Stroke  (HCC)   . Systemic lupus erythematosus (HCC)   . Type 2 diabetes mellitus (HCC)    Past Surgical History:  Procedure Laterality Date  . ICD  02/13/2011   Medtronic Protecta DR XT  . ICD GENERATOR CHANGEOUT N/A 01/07/2017   Procedure: ICD GENERATOR CHANGEOUT;  Surgeon: Marinus Maw, MD;  Location: [facility] INVASIVE CV LAB;  Service: Cardiovascular;  Laterality: N/A;  . IMPLANTABLE CARDIOVERTER DEFIBRILLATOR IMPLANT N/A 02/13/2011   Procedure: IMPLANTABLE CARDIOVERTER DEFIBRILLATOR IMPLANT;  Surgeon: Thurmon Fair, MD;  Location: [facility] CATH LAB;  Service: Cardiovascular;  Laterality: N/A;  . LEFT HEART CATHETERIZATION WITH CORONARY ANGIOGRAM N/A 12/05/2010   Procedure: LEFT HEART CATHETERIZATION WITH CORONARY ANGIOGRAM;  Surgeon: Chrystie Nose, MD;  Location: [facility] CATH LAB;  Service: Cardiovascular;  Laterality: N/A;  . RIGHT ANKLE    . TUBAL LIGATION    . US ECHOCARDIOGRAPHY  11/09/2010   mild LV enlargement w/conc. LVH & severe global hypokinesis EF 30-35%,mod. diastolic dysfunction, mod. TR,mild AI,mild to mod MR,mild PI     A IV Location/Drains/Wounds Patient Lines/Drains/Airways Status   Active Line/Drains/Airways    Name:   Placement date:   Placement time:   Site:   Days:   Peripheral IV 03/19/18 Left Forearm   03/19/18    0659    Forearm   less than 1   Incision 02/13/11 Chest Left   02/13/11    -  2591          Intake/Output Last 24 hours  Intake/Output Summary (Last 24 hours) at 03/19/2018 2124 Last data filed at 03/19/2018 1410 Gross per 24 hour  Intake -  Output 700 ml  Net -700 ml    Labs/Imaging Results for orders placed or performed during the hospital encounter of 03/19/18 (from the past 48 hour(s))  CBC     Status: Abnormal   Collection Time: 03/19/18  6:56 AM  Result Value Ref Range   WBC 5.2 4.0 - 10.5 K/uL   RBC 4.38 3.87 - 5.11 MIL/uL   Hemoglobin 13.4 12.0 - 15.0 g/dL   HCT 58.0 99.8 - 33.8 %   MCV 94.5 80.0 - 100.0 fL   MCH 30.6 26.0 - 34.0 pg    MCHC 32.4 30.0 - 36.0 g/dL   RDW 25.0 (H) 53.9 - 76.7 %   Platelets 251 150 - 400 K/uL   nRBC 2.1 (H) 0.0 - 0.2 %    Comment: Performed at [facility], [address] [place], [place] [address]  Brain natriuretic peptide     Status: Abnormal   Collection Time: 03/19/18  7:02 AM  Result Value Ref Range   B Natriuretic Peptide >4,500.0 (H) 0.0 - 100.0 pg/mL    Comment: RESULT CONFIRMED BY MANUAL DILUTION. QSD Performed at [facility], [address] [place], [place] [address]   Comprehensive metabolic panel     Status: Abnormal   Collection Time: 03/19/18  7:44 AM  Result Value Ref Range   Sodium 141 135 - 145 mmol/L   Potassium 2.3 (LL) 3.5 - 5.1 mmol/L    Comment: CRITICAL RESULT CALLED TO, READ BACK BY AND VERIFIED WITH ANGELA ROBBINS ON 03/19/2018 AT 0815 QSD    Chloride 105 98 - 111 mmol/L   CO2 24 22 - 32 mmol/L   Glucose, Bld 72 70 - 99 mg/dL   BUN 23 (H) 6 - 20 mg/dL   Creatinine, Ser 0.97 (H) 0.44 - 1.00 mg/dL   Calcium 8.5 (L) 8.9 - 10.3 mg/dL   Total Protein 7.4 6.5 - 8.1 g/dL   Albumin 2.9 (L) 3.5 - 5.0 g/dL   AST 38 15 - 41 U/L   ALT 29 0 - 44 U/L   Alkaline Phosphatase 134 (H) 38 - 126 U/L   Total Bilirubin 3.1 (H) 0.3 - 1.2 mg/dL   GFR calc non Af Amer 53 (L) >60 mL/min   GFR calc Af Amer >60 >60 mL/min   Anion gap 12 5 - 15    Comment: Performed at [facility], [address] [place], [place] [address]  Troponin I -     Status: None   Collection Time: 03/19/18  7:44 AM  Result Value Ref Range   Troponin I <0.03 <0.03 ng/mL    Comment: Performed at [facility], [address] [place], [place] [address]  Magnesium     Status: None   Collection Time: 03/19/18  7:44 AM  Result Value Ref Range   Magnesium 1.9 1.7 - 2.4 mg/dL    Comment: Performed at [facility], [address] [place], [place] [address]  Lipase, blood     Status: None   Collection Time: 03/19/18  7:44 AM  Result Value Ref Range    Lipase 28 11 - 51 U/L    Comment: Performed at [facility], [address] [place], [place] [address]  Glucose, capillary  Status: Abnormal   Collection Time: 03/19/18  7:47 AM  Result Value Ref Range   Glucose-Capillary 66 (L) 70 - 99 mg/dL   Comment 1 Call MD NNP PA CNM   Glucose, capillary     Status: None   Collection Time: 03/19/18  8:07 AM  Result Value Ref Range   Glucose-Capillary 89 70 - 99 mg/dL  Influenza panel by PCR (type A & B)     Status: None   Collection Time: 03/19/18 11:09 AM  Result Value Ref Range   Influenza A By PCR NEGATIVE NEGATIVE   Influenza B By PCR NEGATIVE NEGATIVE    Comment: (NOTE) The Xpert Xpress Flu assay is intended as an aid in the diagnosis of  influenza and should not be used as a sole basis for treatment.  This  assay is FDA approved for nasopharyngeal swab specimens only. Nasal  washings and aspirates are unacceptable for Xpert Xpress Flu testing. Performed at [facility], [address] [place], [place] [address]   Respiratory Panel by PCR     Status: None   Collection Time: 03/19/18 11:09 AM  Result Value Ref Range   Adenovirus NOT DETECTED NOT DETECTED   Coronavirus 229E NOT DETECTED NOT DETECTED    Comment: (NOTE) The Coronavirus on the Respiratory Panel, DOES NOT test for the novel  Coronavirus (2019 nCoV)    Coronavirus HKU1 NOT DETECTED NOT DETECTED   Coronavirus NL63 NOT DETECTED NOT DETECTED   Coronavirus OC43 NOT DETECTED NOT DETECTED   Metapneumovirus NOT DETECTED NOT DETECTED   Rhinovirus / Enterovirus NOT DETECTED NOT DETECTED   Influenza A NOT DETECTED NOT DETECTED   Influenza B NOT DETECTED NOT DETECTED   Parainfluenza Virus 1 NOT DETECTED NOT DETECTED   Parainfluenza Virus 2 NOT DETECTED NOT DETECTED   Parainfluenza Virus 3 NOT DETECTED NOT DETECTED   Parainfluenza Virus 4 NOT DETECTED NOT DETECTED   Respiratory Syncytial Virus NOT DETECTED NOT DETECTED   Bordetella pertussis NOT DETECTED  NOT DETECTED   Chlamydophila pneumoniae NOT DETECTED NOT DETECTED   Mycoplasma pneumoniae NOT DETECTED NOT DETECTED    Comment: Performed at [facility], [address], [place], [place] [address]  Glucose, capillary     Status: Abnormal   Collection Time: 03/19/18  1:48 PM  Result Value Ref Range   Glucose-Capillary 149 (H) 70 - 99 mg/dL  Glucose, capillary     Status: Abnormal   Collection Time: 03/19/18  5:16 PM  Result Value Ref Range   Glucose-Capillary 116 (H) 70 - 99 mg/dL   Dg Chest 2 View  Result Date: 03/19/2018 CLINICAL DATA:  Weakness for several months EXAM: CHEST - 2 VIEW COMPARISON:  03/15/2017 FINDINGS: Cardiac shadow remains enlarged. Defibrillator is again noted and stable. The lungs are well aerated bilaterally without focal infiltrate or sizable effusion. Previously seen vascular congestion has resolved in the interval. No bony abnormality is seen. IMPRESSION: Stable cardiomegaly.  No acute abnormality noted. Electronically Signed   By: Alcide Clever M.D.   On: 03/19/2018 07:40   US Abdomen Limited Ruq  Result Date: 03/19/2018 CLINICAL DATA:  Elevated bilirubin EXAM: ULTRASOUND ABDOMEN LIMITED RIGHT UPPER QUADRANT COMPARISON:  None. FINDINGS: Gallbladder: Gallbladder is filled with cholelithiasis. Gallbladder wall thickness measures approximately 6 mm. Largest calculus measures 12 mm. No pericholecystic fluid. Negative sonographic Murphy sign. Common bile duct: Diameter: 4 mm Liver: No focal lesion identified. Increased hepatic parenchymal echogenicity. Portal vein is patent on color Doppler imaging with  normal direction of blood flow towards the liver. IMPRESSION: 1. Gallbladder filled with cholelithiasis with mild gallbladder wall thickening, but without pericholecystic fluid or a sonographic Murphy sign. No sonographic evidence of acute cholecystitis. 2. Increased hepatic echogenicity as can be seen with hepatic steatosis. Electronically Signed   By: Elige Ko   On:  03/19/2018 12:55    Pending Labs Unresulted Labs (From admission, onward)    Start     Ordered   03/26/18 0500  Creatinine, serum  (enoxaparin (LOVENOX)    CrCl >/= 30 ml/min)  Weekly,   STAT    Comments:  while on enoxaparin therapy    03/19/18 1106   03/20/18 0500  Basic metabolic panel  Daily,   STAT     03/19/18 1106   03/20/18 0500  Hepatic function panel  Tomorrow morning,   STAT     03/19/18 1315   03/19/18 1230  Hemoglobin A1c  Once,   R     03/19/18 1230   03/19/18 0644  Urinalysis, Complete w Microscopic  ONCE - STAT,   STAT     03/19/18 0644          Vitals/Pain Today's Vitals   03/19/18 1700 03/19/18 1800 03/19/18 1830 03/19/18 1918  BP: 123/88 135/82 (!) 118/104 129/88  Pulse: 92 90  92  Resp: Temp:    98.1 F (36.7 C)  TempSrc:    Oral  SpO2: 100% 100%  100%  Weight:      Height:      PainSc:    7     Isolation Precautions Droplet precaution  Medications Medications  sodium chloride flush (NS) 0.9 % injection 3 mL (3 mLs Intravenous Not Given 03/19/18 1413)  insulin aspart (novoLOG) injection 0-9 Units (0 Units Subcutaneous Not Given 03/19/18 1716)  insulin aspart (novoLOG) injection 0-5 Units (has no administration in time range)  sodium chloride flush (NS) 0.9 % injection 3 mL (3 mLs Intravenous Not Given 03/19/18 1413)  sodium chloride flush (NS) 0.9 % injection 3 mL (has no administration in time range)  0.9 %  sodium chloride infusion (has no administration in time range)  acetaminophen (TYLENOL) tablet 650 mg (has no administration in time range)  ondansetron (ZOFRAN) injection 4 mg (has no administration in time range)  enoxaparin (LOVENOX) injection 40 mg (40 mg Subcutaneous Given 03/19/18 1818)  furosemide (LASIX) injection 40 mg (40 mg Intravenous Given 03/19/18 1818)  lisinopril (PRINIVIL,ZESTRIL) tablet 5 mg (5 mg Oral Given 03/19/18 1401)  hydroxychloroquine (PLAQUENIL) tablet 200 mg (200 mg Oral Given 03/19/18 1520)  hydrALAZINE  (APRESOLINE) tablet 20 mg (20 mg Oral Given 03/19/18 1519)  isosorbide mononitrate (IMDUR) 24 hr tablet 30 mg (30 mg Oral Given 03/19/18 1359)  venlafaxine XR (EFFEXOR-XR) 24 hr capsule 150 mg (150 mg Oral Given 03/19/18 1520)  clopidogrel (PLAVIX) tablet 75 mg (75 mg Oral Given 03/19/18 1359)  loratadine (CLARITIN) tablet 10 mg (10 mg Oral Given 03/19/18 1400)  mometasone-formoterol (DULERA) 200-5 MCG/ACT inhaler 2 puff (2 puffs Inhalation Given 03/19/18 1520)  amiodarone (PACERONE) tablet 200 mg (200 mg Oral Given 03/19/18 1520)  carvedilol (COREG) tablet 6.25 mg (6.25 mg Oral Given 03/19/18 1818)  oxyCODONE-acetaminophen (PERCOCET/ROXICET) 5-325 MG per tablet 1 tablet (1 tablet Oral Given 03/19/18 1923)    And  oxyCODONE (Oxy IR/ROXICODONE) immediate release tablet 5 mg (has no administration in time range)  polyethylene glycol (MIRALAX / GLYCOLAX) packet 17 g (has no administration in time range)  multivitamin with minerals tablet 1 tablet (1 tablet Oral Given 03/19/18 1520)  albuterol (PROVENTIL) (2.5 MG/3ML) 0.083% nebulizer solution 2.5 mg (has no administration in time range)  insulin aspart protamine- aspart (NOVOLOG MIX 70/30) injection 10 Units (has no administration in time range)  potassium chloride (KLOR-CON) packet 40 mEq (40 mEq Oral Given 03/19/18 1818)  furosemide (LASIX) injection 40 mg (40 mg Intravenous Given 03/19/18 0951)  potassium chloride SA (K-DUR,KLOR-CON) CR tablet 40 mEq (40 mEq Oral Given 03/19/18 1358)  magnesium sulfate IVPB 2 g 50 mL (0 g Intravenous Stopped 03/19/18 1517)    Mobility walks with device High fall risk   Focused Assessments Cardiac Assessment Handoff:  Cardiac Rhythm: Normal sinus rhythm Lab Results  Component Value Date   TROPONINI <0.03 03/19/2018   No results found for: DDIMER Does the Patient currently have chest pain? No  , Neuro Assessment Handoff:  Swallow screen pass? n/a Cardiac Rhythm: Normal sinus rhythm       Neuro Assessment:   a o x4 Neuro Checks: none     Last Documented NIHSS Modified Score:   Has TPA been given? No If patient is a Neuro Trauma and patient is going to OR before floor call report to 4N Charge nurse: [phone] or [phone]     R Recommendations: See Admitting Provider Note  Report given to:   Additional Notes:  Pt has purewick placed, chronic leg pain-PRN meds given for the same.

## 2018-03-19 NOTE — ED Notes (Signed)
Kelly RN, aware of bed assigned   

## 2018-03-19 NOTE — Progress Notes (Addendum)
As per ED they just gave pt her novolog 70/30 10 units at 2130 prior to coming up to 2A. Pt have a scheduled novolog at bedtime and blood sugar was 215 at 2220. Talked to NP [facility] and states to give her sliding scale (2 units) and recheck after 1 hour. Will continue to monitor.  Update 0125: Pt Blood sugar is at 139. Will continue to monitor.  Update 0304: Pt complaints of itching where the leads attached to her skin. Notify [facility]. Will continue to monitor.  Update 0309: NP Sidney ordered Benadryl 12.5 mg IV once and Benadryl 2-0.1% cream three times daily PRN. Will continue to monitor.

## 2018-03-19 NOTE — ED Notes (Signed)
Pt given protein/carb snack

## 2018-03-19 NOTE — ED Notes (Signed)
Pt desatting to 89-90% on room air when sleeping. Pt placed on 2 liters of O2 via [place]. Dr. Cyril Loosen aware.

## 2018-03-19 NOTE — ED Notes (Signed)
Pt CBG 66, Dr. Cyril Loosen made aware. Pt given 4 oz of orange juice, will recheck CBG in 15 minutes. RN will continue to monitor pt.

## 2018-03-19 NOTE — ED Notes (Signed)
Report attempted, call back number left

## 2018-03-19 NOTE — ED Notes (Signed)
RN in room, pt shortness of breath. Pt SpO2 98-100% on room air. Pt lung sounds clear in all fields. RN will continue to monitor pt.

## 2018-03-19 NOTE — ED Provider Notes (Addendum)
[facility] Emergency Department Provider Note   ____________________________________________    I have reviewed the triage vital signs and the nursing notes.   HISTORY  Chief Complaint Shortness of Breath and Weakness     HPI Katherine [name] is a 58 y.o. female who presents with complaints of weakness and some shortness of breath.  Symptoms been ongoing for several months apparently but seem to have worsened over the last 1 to 2 weeks.  She denies fevers or chills.  No cough.  No chest pain.  Does report a history of CHF as well as CKD and diabetes.  Denies dysuria.  No recent travel.  No calf pain or swelling.  She reports she was admitted to [facility] about a month ago but she does not have a great understanding of why she was in the hospital.  She reports she has been feeling somewhat better since then  Past Medical History:  Diagnosis Date  . Anxiety   . Arthritis   . Asthma   . Bronchitis   . CHF (congestive heart failure) (HCC)    a. EF 20-25% by cath in 11/2010 with cath showing normal cors  . CKD (chronic kidney disease) stage 3, GFR 30-59 ml/min (HCC)   . Fibromyalgia   . GERD (gastroesophageal reflux disease)   . Hallux valgus of left foot   . Hay fever   . Headache(784.0)   . History of pneumonia   . History of seizures   . Hyperlipidemia   . Hypertension   . Nonischemic cardiomyopathy (HCC)   . Obesity   . Stroke (HCC)   . Systemic lupus erythematosus (HCC)   . Type 2 diabetes mellitus [facility])      Patient Active Problem List   Diagnosis Date Noted  . Morbid obesity due to excess calories (HCC) 09/24/2015  . Personal history of noncompliance with medical treatment, presenting hazards to health 02/19/2015  . Acute on chronic combined systolic and diastolic CHF (congestive heart failure) (HCC) 11/17/2012  . Non-ischemic cardiomyopathy (HCC) 10/03/2012  . AICD (automatic cardioverter/defibrillator) present 10/03/2012   . HTN (hypertension) 10/03/2012  . Diabetes mellitus with stage 3 chronic kidney disease (HCC) 10/03/2012  . Fibromyalgia 10/03/2012  . Dyslipidemia 10/03/2012  . Lupus (HCC) 10/03/2012  . Stroke [facility]) 10/03/2012    Past Surgical History:  Procedure Laterality Date  . ICD  02/13/2011   Medtronic Protecta DR XT  . ICD GENERATOR CHANGEOUT N/A 01/07/2017   Procedure: ICD GENERATOR CHANGEOUT;  Surgeon: Marinus Maw, MD;  Location: [facility] INVASIVE CV LAB;  Service: Cardiovascular;  Laterality: N/A;  . IMPLANTABLE CARDIOVERTER DEFIBRILLATOR IMPLANT N/A 02/13/2011   Procedure: IMPLANTABLE CARDIOVERTER DEFIBRILLATOR IMPLANT;  Surgeon: Thurmon Fair, MD;  Location: [facility] CATH LAB;  Service: Cardiovascular;  Laterality: N/A;  . LEFT HEART CATHETERIZATION WITH CORONARY ANGIOGRAM N/A 12/05/2010   Procedure: LEFT HEART CATHETERIZATION WITH CORONARY ANGIOGRAM;  Surgeon: Chrystie Nose, MD;  Location: [facility] CATH LAB;  Service: Cardiovascular;  Laterality: N/A;  . RIGHT ANKLE    . TUBAL LIGATION    . US ECHOCARDIOGRAPHY  11/09/2010   mild LV enlargement w/conc. LVH & severe global hypokinesis EF 30-35%,mod. diastolic dysfunction, mod. TR,mild AI,mild to mod MR,mild PI    Prior to Admission medications   Medication Sig Start Date End Date Taking? Authorizing Provider  albuterol (PROVENTIL HFA;VENTOLIN HFA) 108 (90 BASE) MCG/ACT inhaler Inhale 2 puffs into the lungs 4 (four) times daily as needed. For wheezing and  shortness of breath    [provider]  cetirizine (ZYRTEC) 10 MG tablet Take 10 mg by mouth daily.     [provider]  clopidogrel (PLAVIX) 75 MG tablet Take 1 tablet (75 mg total) by mouth daily. 09/23/17   Marinus Maw, MD  COD LIVER OIL PO Take by mouth.    [provider]  furosemide (LASIX) 80 MG tablet Take 1 tablet (80 mg total) by mouth daily. 01/24/18 01/24/19  Sherryll Burger, Pratik D, DO  HUMALOG MIX 50/50 KWIKPEN (50-50) 100 UNIT/ML Kwikpen INJECT 30 UNITS INTO THE  SKIN TWICE DAILY. 01/28/16   Roma Kayser, MD  hydrALAZINE (APRESOLINE) 10 MG tablet Take 2 tablets (20 mg total) by mouth every 8 (eight) hours. 01/24/18 02/23/18  Sherryll Burger, Pratik D, DO  hydroxychloroquine (PLAQUENIL) 200 MG tablet Take 200 mg by mouth 2 (two) times daily.    [provider]  isosorbide mononitrate (IMDUR) 30 MG 24 hr tablet Take 1 tablet (30 mg total) by mouth daily. 01/25/18 02/24/18  Sherryll Burger, Pratik D, DO  Magnesium 400 MG TABS Take 400 mg by mouth daily for 30 days. 03/15/18 04/14/18  Marinus Maw, MD  metoprolol succinate (TOPROL-XL) 50 MG 24 hr tablet Take 1 tablet (50 mg total) by mouth daily. Take with or immediately following a meal. 01/25/18 02/24/18  Sherryll Burger, Pratik D, DO  mometasone-formoterol (DULERA) 200-5 MCG/ACT AERO Inhale 2 puffs into the lungs 2 (two) times daily. 01/24/18 02/23/18  Sherryll Burger, Pratik D, DO  Multiple Vitamins-Minerals (MULTIVITAMIN WOMEN 50+ PO) Take 1 tablet by mouth daily.    [provider]  potassium chloride SA (K-DUR,KLOR-CON) 20 MEQ tablet Take 2 tablets (40 mEq total) by mouth daily. 01/25/18 02/24/18  Sherryll Burger, Pratik D, DO  simvastatin (ZOCOR) 20 MG tablet TAKE ONE TABLET BY MOUTH AT BEDTIME. 11/24/17   Marinus Maw, MD  venlafaxine XR (EFFEXOR-XR) 150 MG 24 hr capsule Take 150 mg by mouth daily.     [provider]     Allergies Aspirin; Penicillins; and Shellfish allergy  Family History  Problem Relation Age of Onset  . Hypertension Sister   . Hypertension Brother   . Hypertension Daughter     Social History Social History   Tobacco Use  . Smoking status: Former Smoker    Packs/day: 1.00    Years: 26.00    Pack years: 26.00    Types: Cigarettes    Start date: 07/01/1979    Last attempt to quit: 10/03/2005    Years since quitting: 12.4  . Smokeless tobacco: Never Used  Substance Use Topics  . Alcohol use: Not Currently    Alcohol/week: 0.0 standard drinks    Comment: quit 2007  . Drug use: No    Review of  Systems  Constitutional: No fever/chills Eyes: No visual changes.  ENT: No sore throat. Cardiovascular: Denies chest pain. Respiratory: As above Gastrointestinal: No abdominal pain.  No nausea, no vomiting.   Genitourinary: Negative for dysuria. Musculoskeletal: Negative for back pain. Skin: Negative for rash. Neurological: Negative for headaches    ____________________________________________   PHYSICAL EXAM:  VITAL SIGNS: ED Triage Vitals  Enc Vitals Group     BP 03/19/18 0658 (!) 142/105     Pulse Rate 03/19/18 0658 82     Resp 03/19/18 0658 (!) 21     Temp 03/19/18 0658 (!) 97.5 F (36.4 C)     Temp Source 03/19/18 0658 Oral     SpO2 03/19/18 0658 99 %  Weight 03/19/18 0641 122.5 kg (270 lb)     Height 03/19/18 0641 1.829 m (6')     Head Circumference --      Peak Flow --      Pain Score 03/19/18 0640 4     Pain Loc --      Pain Edu? --      Excl. in GC? --     Constitutional: Alert and oriented. No acute distress.  Eyes: Conjunctivae are normal.  Head: Atraumatic. Nose: No congestion/rhinnorhea. Mouth/Throat: Mucous membranes are moist.    Cardiovascular: Normal rate, regular rhythm. Grossly normal heart sounds.  Good peripheral circulation. Respiratory: Normal respiratory effort.  No retractions. Lungs CTAB. Gastrointestinal: Soft and nontender. No distention.    Musculoskeletal: .  Warm and well perfused, 1+ edema bilaterally Neurologic:  Normal speech and language. No gross focal neurologic deficits are appreciated.  Skin:  Skin is warm, dry and intact. No rash noted. Psychiatric: Mood and affect are normal. Speech and behavior are normal.  ____________________________________________   LABS (all labs ordered are listed, but only abnormal results are displayed)  Labs Reviewed  CBC - Abnormal; Notable for the following components:      Result Value   RDW 20.1 (*)    nRBC 2.1 (*)    All other components within normal limits  BRAIN NATRIURETIC  PEPTIDE - Abnormal; Notable for the following components:   B Natriuretic Peptide >4,500.0 (*)    All other components within normal limits  COMPREHENSIVE METABOLIC PANEL - Abnormal; Notable for the following components:   Potassium 2.3 (*)    BUN 23 (*)    Creatinine, Ser 1.14 (*)    Calcium 8.5 (*)    Albumin 2.9 (*)    Alkaline Phosphatase 134 (*)    Total Bilirubin 3.1 (*)    GFR calc non Af Amer 53 (*)    All other components within normal limits  GLUCOSE, CAPILLARY - Abnormal; Notable for the following components:   Glucose-Capillary 66 (*)    All other components within normal limits  TROPONIN I  URINALYSIS, COMPLETE (UACMP) WITH MICROSCOPIC  CBG MONITORING, ED   ____________________________________________  EKG  ED ECG REPORT I, Jene Every, the attending physician, personally viewed and interpreted this ECG.  Date: 03/19/2018 Rhythm: normal sinus rhythm QRS Axis: Abnormal Intervals: Abnormal ST/T Wave abnormalities: Abnormal Narrative Interpretation: no evidence of acute ischemia  ____________________________________________  RADIOLOGY  Chest x-ray no acute distress ____________________________________________   PROCEDURES  Procedure(s) performed: No  Procedures   Critical Care performed: yes  CRITICAL CARE Performed by: Jene Every   Total critical care time: 30 minutes  Critical care time was exclusive of separately billable procedures and treating other patients.  Critical care was necessary to treat or prevent imminent or life-threatening deterioration.  Critical care was time spent personally by me on the following activities: development of treatment plan with patient and/or surrogate as well as nursing, discussions with consultants, evaluation of patient's response to treatment, examination of patient, obtaining history from patient or surrogate, ordering and performing treatments and interventions, ordering and review of laboratory  studies, ordering and review of radiographic studies, pulse oximetry and re-evaluation of patient's condition.  ____________________________________________   INITIAL IMPRESSION / ASSESSMENT AND PLAN / ED COURSE  Pertinent labs & imaging results that were available during my care of the patient were reviewed by me and considered in my medical decision making (see chart for details).  Patient presents with generalized weakness, reports over breathing  was worse last night but feels better now.  Denies fevers or chills.  No chest pain.  Chest x-ray is reassuring, no evidence of edema.  Pending lab work  Lab work significant for BNP greater than 4500, room air saturation while lying down is 88 to 89%, we have started her on nasal cannula oxygen.  Suspect early CHF exacerbation, will treat with IV Lasix admit to the hospitalist service    ____________________________________________   FINAL CLINICAL IMPRESSION(S) / ED DIAGNOSES  Final diagnoses:  Acute on chronic congestive heart failure, unspecified heart failure type [facility])        Note:  This document was prepared using Dragon voice recognition software and may include unintentional dictation errors.   Jene Every, MD 03/19/18 1011    Jene Every, MD 03/25/18 [phone]

## 2018-03-19 NOTE — ED Notes (Signed)
RN rechecked CBG. CBG 88. Dr. Cyril Loosen aware

## 2018-03-19 NOTE — ED Notes (Signed)
Pt off the the floor in x-ray

## 2018-03-19 NOTE — Progress Notes (Signed)
Patient ID: Katherine [name], female   DOB: 18-Jul-1960, 58 y.o.   MRN: [id number]  ACP note  Patient present  Diagnosis: Acute on chronic systolic congestive heart failure.  Patient has a AICD.Marland Kitchen  CODE STATUS discussed and patient is a full code.  The patient is coming in with shortness of breath.  With congestive heart failure with low ejection fraction mortality is pretty high.  CODE STATUS discussed.  Goals of care discussed.  Patient also has other medical issues including lupus, CVA, hypertension, asthma.  Time spent on ACP discussion 17 minutes Dr. Alford Highland

## 2018-03-19 NOTE — ED Notes (Signed)
Sent message to Dr. Renae Gloss to request that order for potassium be changed to packet form or liquid form because pt is having difficulty swallowing the potassium pills due to size. Will await response.

## 2018-03-19 NOTE — ED Triage Notes (Signed)
[place] pt to Rm 19 from home with report of weakness for several months. Worse over the last few days. Increased shob and wheezing when she lays down and becomes bradycardic.

## 2018-03-19 NOTE — H&P (Addendum)
[facility] [place] [facility]   PATIENT NAME: Katherine [name]    MR#:  [id number]  DATE OF BIRTH:  March 19, 1960  DATE OF ADMISSION:  03/19/2018  PRIMARY CARE PHYSICIAN: Alvina Filbert, MD   REQUESTING/REFERRING PHYSICIAN: Dr Jene Every  CHIEF COMPLAINT:   Chief Complaint  Patient presents with  . Shortness of Breath  . Weakness    HISTORY OF PRESENT ILLNESS:  Katherine [name]  is a 59 y.o. female with a known history of systolic congestive heart failure presents with trouble breathing going on since Tuesday or Wednesday.  She complains of her leg swelling.  Weight goes up and down.  Occasionally has a cough with whitish phlegm.  Occasionally has some wheezing. She also complains of headache indigestion skin dry.  She states that her right leg is been heavy since she had a stroke.  Hospitalist were contacted for admission for congestive heart failure.  Last echocardiogram showed an EF of 20 to 25%.  Patient does complain of fever chills and sweats.  PAST MEDICAL HISTORY:   Past Medical History:  Diagnosis Date  . Anxiety   . Arthritis   . Asthma   . Bronchitis   . CHF (congestive heart failure) (HCC)    a. EF 20-25% by cath in 11/2010 with cath showing normal cors  . CKD (chronic kidney disease) stage 3, GFR 30-59 ml/min (HCC)   . Fibromyalgia   . GERD (gastroesophageal reflux disease)   . Hallux valgus of left foot   . Hay fever   . Headache(784.0)   . History of pneumonia   . History of seizures   . Hyperlipidemia   . Hypertension   . Nonischemic cardiomyopathy (HCC)   . Obesity   . Stroke (HCC)   . Systemic lupus erythematosus (HCC)   . Type 2 diabetes mellitus (HCC)     PAST SURGICAL HISTORY:   Past Surgical History:  Procedure Laterality Date  . ICD  02/13/2011   Medtronic Protecta DR XT  . ICD GENERATOR CHANGEOUT N/A 01/07/2017   Procedure: ICD GENERATOR CHANGEOUT;  Surgeon: Marinus Maw, MD;  Location: [facility] INVASIVE CV  LAB;  Service: Cardiovascular;  Laterality: N/A;  . IMPLANTABLE CARDIOVERTER DEFIBRILLATOR IMPLANT N/A 02/13/2011   Procedure: IMPLANTABLE CARDIOVERTER DEFIBRILLATOR IMPLANT;  Surgeon: Thurmon Fair, MD;  Location: [facility] CATH LAB;  Service: Cardiovascular;  Laterality: N/A;  . LEFT HEART CATHETERIZATION WITH CORONARY ANGIOGRAM N/A 12/05/2010   Procedure: LEFT HEART CATHETERIZATION WITH CORONARY ANGIOGRAM;  Surgeon: Chrystie Nose, MD;  Location: [facility] C[facility] LAB;  Service: Cardiovascular;  Laterality: N/A;  . RIGHT ANKLE    . TUBAL LIGATION    . US ECHOCARDIOGRAPHY  11/09/2010   mild LV enlargement w/conc. LVH & severe global hypokinesis EF 30-35%,mod. diastolic dysfunction, mod. TR,mild AI,mild to mod MR,mild PI    SOCIAL HISTORY:   Social History   Tobacco Use  . Smoking status: Former Smoker    Packs/day: 1.00    Years: 26.00    Pack years: 26.00    Types: Cigarettes    Start date: 07/01/1979    Last attempt to quit: 10/03/2005    Years since quitting: 12.4  . Smokeless tobacco: Never Used  Substance Use Topics  . Alcohol use: Not Currently    Alcohol/week: 0.0 standard drinks    Comment: quit 2007    FAMILY HISTORY:   Family History  Problem Relation Age of Onset  . Heart failure Mother   . Diabetes Father   .  Hypertension Sister   . Hypertension Brother   . Hypertension Daughter     DRUG ALLERGIES:   Allergies  Allergen Reactions  . Aspirin Other (See Comments)  . Penicillins Itching    Can take amoxicillin without a reaction  . Shellfish Allergy Other (See Comments)    Causes to have grand mal seizures    REVIEW OF SYSTEMS:  CONSTITUTIONAL: Positive for chills, sweats and fever.  Positive for fatigue.  EYES: Occasional blurred vision. EARS, NOSE, AND THROAT: No tinnitus or ear pain.  Positive for sore throat and sores in the mouth with lupus RESPIRATORY: Positive for cough, shortness of breath, and wheezing.  No hemoptysis.  CARDIOVASCULAR: No chest pain,  positive for edema.  GASTROINTESTINAL: Positive for nausea, vomiting.  Alternating constipation and diarrhea.  No abdominal pain. No blood in bowel movements GENITOURINARY: No dysuria, hematuria.  ENDOCRINE: No polyuria, nocturia,  HEMATOLOGY: No anemia, easy bruising or bleeding SKIN: Positive for dry skin and itching MUSCULOSKELETAL: Positive for joint pain in feet ankles and legs.   NEUROLOGIC: No tingling, numbness, weakness.  PSYCHIATRY: Some depression but no thoughts of hurting herself or other people.  MEDICATIONS AT HOME:   Prior to Admission medications   Medication Sig Start Date End Date Taking? Authorizing Provider  clopidogrel (PLAVIX) 75 MG tablet Take 1 tablet (75 mg total) by mouth daily. 09/23/17  Yes Marinus Maw, MD  albuterol (PROVENTIL HFA;VENTOLIN HFA) 108 (90 BASE) MCG/ACT inhaler Inhale 2 puffs into the lungs 4 (four) times daily as needed. For wheezing and shortness of breath    [provider]  cetirizine (ZYRTEC) 10 MG tablet Take 10 mg by mouth daily.     [provider]  COD LIVER OIL PO Take by mouth.    [provider]  furosemide (LASIX) 80 MG tablet Take 1 tablet (80 mg total) by mouth daily. 01/24/18 01/24/19  Sherryll Burger, Pratik D, DO  HUMALOG MIX 50/50 KWIKPEN (50-50) 100 UNIT/ML Kwikpen INJECT 30 UNITS INTO THE SKIN TWICE DAILY. 01/28/16   Roma Kayser, MD  hydrALAZINE (APRESOLINE) 10 MG tablet Take 2 tablets (20 mg total) by mouth every 8 (eight) hours. 01/24/18 02/23/18  Sherryll Burger, Pratik D, DO  hydroxychloroquine (PLAQUENIL) 200 MG tablet Take 200 mg by mouth 2 (two) times daily.    [provider]  isosorbide mononitrate (IMDUR) 30 MG 24 hr tablet Take 1 tablet (30 mg total) by mouth daily. 01/25/18 02/24/18  Sherryll Burger, Pratik D, DO  Magnesium 400 MG TABS Take 400 mg by mouth daily for 30 days. 03/15/18 04/14/18  Marinus Maw, MD  metoprolol succinate (TOPROL-XL) 50 MG 24 hr tablet Take 1 tablet (50 mg total) by mouth daily. Take with  or immediately following a meal. 01/25/18 02/24/18  Sherryll Burger, Pratik D, DO  mometasone-formoterol (DULERA) 200-5 MCG/ACT AERO Inhale 2 puffs into the lungs 2 (two) times daily. 01/24/18 02/23/18  Sherryll Burger, Pratik D, DO  Multiple Vitamins-Minerals (MULTIVITAMIN WOMEN 50+ PO) Take 1 tablet by mouth daily.    [provider]  potassium chloride SA (K-DUR,KLOR-CON) 20 MEQ tablet Take 2 tablets (40 mEq total) by mouth daily. 01/25/18 02/24/18  Sherryll Burger, Pratik D, DO  simvastatin (ZOCOR) 20 MG tablet TAKE ONE TABLET BY MOUTH AT BEDTIME. 11/24/17   Marinus Maw, MD  venlafaxine XR (EFFEXOR-XR) 150 MG 24 hr capsule Take 150 mg by mouth daily.     [provider]      VITAL SIGNS:  Blood pressure (!) 155/108, pulse  82, temperature (!) 97.5 F (36.4 C), temperature source Oral, resp. rate 14, height 6' (1.829 m), weight 122.5 kg, last menstrual period 01/30/2011, SpO2 100 %.  PHYSICAL EXAMINATION:  GENERAL:  58 y.o.-year-old patient lying in the bed with no acute distress.  EYES: Pupils equal, round, reactive to light and accommodation. No scleral icterus. Extraocular muscles intact.  HEENT: Head atraumatic, normocephalic. Oropharynx and nasopharynx clear.  NECK:  Supple, no jugular venous distention. No thyroid enlargement, no tenderness.  LUNGS: Decreased breath sounds bilaterally, no wheezing.  Positive rales at lung bases. No use of accessory muscles of respiration.  CARDIOVASCULAR: S1, S2 normal. No murmurs, rubs, or gallops.  ABDOMEN: Soft, nontender, nondistended. Bowel sounds present. No organomegaly or mass.  EXTREMITIES: 2+ pedal edema.no cyanosis, or clubbing.  NEUROLOGIC: Cranial nerves II through XII are intact. Muscle strength 5/5 in all extremities. Sensation intact. Gait not checked.  PSYCHIATRIC: The patient is alert and oriented x 3.  SKIN: No rash, lesion, or ulcer.   LABORATORY PANEL:   CBC Recent Labs  Lab 03/19/18 0656  WBC 5.2  HGB 13.4  HCT 41.4  PLT 251    ------------------------------------------------------------------------------------------------------------------  Chemistries  Recent Labs  Lab 03/14/18 1702 03/19/18 0744  NA 143 141  K 2.6* 2.3*  CL 104 105  CO2 22 24  GLUCOSE 121* 72  BUN 27* 23*  CREATININE 1.94* 1.14*  CALCIUM 7.7* 8.5*  MG 1.5*  --   AST  --  38  ALT  --  29  ALKPHOS  --  134*  BILITOT  --  3.1*   ------------------------------------------------------------------------------------------------------------------  Cardiac Enzymes Recent Labs  Lab 03/19/18 0744  TROPONINI <0.03   ------------------------------------------------------------------------------------------------------------------  RADIOLOGY:  Dg Chest 2 View  Result Date: 03/19/2018 CLINICAL DATA:  Weakness for several months EXAM: CHEST - 2 VIEW COMPARISON:  03/15/2017 FINDINGS: Cardiac shadow remains enlarged. Defibrillator is again noted and stable. The lungs are well aerated bilaterally without focal infiltrate or sizable effusion. Previously seen vascular congestion has resolved in the interval. No bony abnormality is seen. IMPRESSION: Stable cardiomegaly.  No acute abnormality noted. Electronically Signed   By: Alcide Clever M.D.   On: 03/19/2018 07:40    EKG:   Normal sinus rhythm 83 bpm, intraventricular conduction delay with left axis deviation and poor R wave progression  IMPRESSION AND PLAN:   1.  Acute on chronic systolic congestive heart failure.  We will give Lasix 40 mg IV twice daily.  Patient already on Coreg and lisinopril.  Will hopefully be able to add Spironolactone prior to discharge. 2.  Hypokalemia.  Replace potassium orally 3 times today.  Previous magnesium was low so I will give IV magnesium today. 3.  Lupus.  Continue Plaquenil 4.  Fever and chills at home send off flu swab and respiratory panel. 5.  Type 2 diabetes mellitus on Humalog 50/50 twice daily.  Will also put on sliding scale. 6.  Depression on  Effexor 7.  COPD.  Continue inhalers. 8.  Obesity with a BMI of 36.62.  Weight loss needed 9.  History of stroke on Plavix 10.  Headache will give IV magnesium 11.  Weakness we will get physical therapy evaluation 12.  Elevated bilirubin could be with nausea vomiting but I will get a right upper quadrant ultrasound to rule out other things like gallbladder cause.    All the records are reviewed and case discussed with ED provider. Management plans discussed with the patient, family and they are in agreement.  CODE STATUS: Full Code  TOTAL TIME TAKING CARE OF THIS PATIENT: 55 minutes, including acp time.    Alford Highland M.D on 03/19/2018 at 11:11 AM  Between 7am to 6pm - Pager - [phone]  After 6pm call admission pager [phone]  [facility]  [phone]  CC: Primary care physician; Alvina Filbert, MD

## 2018-03-20 DIAGNOSIS — I428 Other cardiomyopathies: Secondary | ICD-10-CM

## 2018-03-20 DIAGNOSIS — R17 Unspecified jaundice: Secondary | ICD-10-CM

## 2018-03-20 LAB — BASIC METABOLIC PANEL
Anion gap: 8 (ref 5–15)
BUN: 21 mg/dL — ABNORMAL HIGH (ref 6–20)
CHLORIDE: 106 mmol/L (ref 98–111)
CO2: 27 mmol/L (ref 22–32)
Calcium: 8.2 mg/dL — ABNORMAL LOW (ref 8.9–10.3)
Creatinine, Ser: 1.11 mg/dL — ABNORMAL HIGH (ref 0.44–1.00)
GFR calc Af Amer: >60 mL/min (ref >60)
GFR calc non Af Amer: 55 mL/min — ABNORMAL LOW (ref >60)
Glucose, Bld: 91 mg/dL (ref 70–99)
Potassium: 2.9 mmol/L — ABNORMAL LOW (ref 3.5–5.1)
Sodium: 141 mmol/L (ref 135–145)

## 2018-03-20 LAB — CBC
HCT: 37.6 % (ref 36.0–46.0)
Hemoglobin: 11.9 g/dL — ABNORMAL LOW (ref 12.0–15.0)
MCH: 30.5 pg (ref 26.0–34.0)
MCHC: 31.6 g/dL (ref 30.0–36.0)
MCV: 96.4 fL (ref 80.0–100.0)
PLATELETS: 232 10*3/uL (ref 150–400)
RBC: 3.9 MIL/uL (ref 3.87–5.11)
RDW: 20.2 % — ABNORMAL HIGH (ref 11.5–15.5)
WBC: 4.5 10*3/uL (ref 4.0–10.5)
nRBC: 0.9 % — ABNORMAL HIGH (ref 0.0–0.2)

## 2018-03-20 LAB — GLUCOSE, CAPILLARY
GLUCOSE-CAPILLARY: 139 mg/dL — AB (ref 70–99)
Glucose-Capillary: 122 mg/dL — ABNORMAL HIGH (ref 70–99)
Glucose-Capillary: 127 mg/dL — ABNORMAL HIGH (ref 70–99)
Glucose-Capillary: 123 mg/dL — ABNORMAL HIGH (ref 70–99)
Glucose-Capillary: 114 mg/dL — ABNORMAL HIGH (ref 70–99)

## 2018-03-20 LAB — HEMOGLOBIN A1C
Hgb A1c MFr Bld: 6.8 % — ABNORMAL HIGH (ref 4.8–5.6)
Mean Plasma Glucose: 148.46 mg/dL

## 2018-03-20 LAB — HEPATIC FUNCTION PANEL
ALK PHOS: 112 U/L (ref 38–126)
ALT: 24 U/L (ref 0–44)
AST: 31 U/L (ref 15–41)
Albumin: 2.5 g/dL — ABNORMAL LOW (ref 3.5–5.0)
BILIRUBIN DIRECT: 1 mg/dL — AB (ref 0.0–0.2)
BILIRUBIN TOTAL: 2.4 mg/dL — AB (ref 0.3–1.2)
Indirect Bilirubin: 1.4 mg/dL — ABNORMAL HIGH (ref 0.3–0.9)
Total Protein: 6.3 g/dL — ABNORMAL LOW (ref 6.5–8.1)

## 2018-03-20 LAB — POTASSIUM: Potassium: 3.8 mmol/L (ref 3.5–5.1)

## 2018-03-20 MED ORDER — CHLORHEXIDINE GLUCONATE 0.12 % MT SOLN
15.0000 mL | Freq: Two times a day (BID) | OROMUCOSAL | Status: DC
  Administered 2018-03-20 – 2018-03-24 (×10): 15 mL via OROMUCOSAL
  Filled 2018-03-20 (×10): qty 15

## 2018-03-20 MED ORDER — ORAL CARE MOUTH RINSE
15.0000 mL | Freq: Two times a day (BID) | OROMUCOSAL | Status: DC
  Administered 2018-03-20 – 2018-03-21 (×3): 15 mL via OROMUCOSAL

## 2018-03-20 MED ORDER — DIPHENHYDRAMINE HCL 50 MG/ML IJ SOLN
12.5000 mg | Freq: Once | INTRAMUSCULAR | Status: AC
  Administered 2018-03-20: 12.5 mg via INTRAVENOUS
  Filled 2018-03-20: qty 1

## 2018-03-20 MED ORDER — LORATADINE 10 MG PO TABS
10.0000 mg | ORAL_TABLET | Freq: Every day | ORAL | Status: DC | PRN
  Administered 2018-03-20: 10 mg via ORAL
  Filled 2018-03-20: qty 1

## 2018-03-20 MED ORDER — HYDROCORTISONE 0.5 % EX CREA
TOPICAL_CREAM | Freq: Three times a day (TID) | CUTANEOUS | Status: DC | PRN
  Administered 2018-03-21: 21:00:00 via TOPICAL
  Filled 2018-03-20: qty 28.35

## 2018-03-20 MED ORDER — GUAIFENESIN 100 MG/5ML PO SOLN
5.0000 mL | ORAL | Status: DC | PRN
  Administered 2018-03-20: 100 mg via ORAL
  Filled 2018-03-20 (×2): qty 5

## 2018-03-20 MED ORDER — POTASSIUM CHLORIDE 20 MEQ PO PACK
40.0000 meq | PACK | Freq: Once | ORAL | Status: AC
  Administered 2018-03-20: 40 meq via ORAL
  Filled 2018-03-20: qty 2

## 2018-03-20 MED ORDER — FUROSEMIDE 10 MG/ML IJ SOLN
80.0000 mg | Freq: Two times a day (BID) | INTRAMUSCULAR | Status: DC
  Administered 2018-03-20 – 2018-03-23 (×4): 80 mg via INTRAVENOUS
  Filled 2018-03-20 (×5): qty 8

## 2018-03-20 MED ORDER — SPIRONOLACTONE 25 MG PO TABS
12.5000 mg | ORAL_TABLET | Freq: Every day | ORAL | Status: DC
  Administered 2018-03-20 – 2018-03-22 (×3): 12.5 mg via ORAL
  Filled 2018-03-20 (×3): qty 0.5
  Filled 2018-03-20 (×3): qty 1
  Filled 2018-03-20: qty 0.5

## 2018-03-20 NOTE — Consult Note (Addendum)
Patient ID: Katherine [name], female   DOB: Mar 15, 1960, 58 y.o.   MRN: [id number]  HPI Katherine [name] is a 58 y.o. female multiple comorbidities including CHF with ejection fraction of 20 to 25%, chronic renal disease.  She came in with worsening dyspnea and overall weakness.  She reported that her weight went up.  Reports that her dyspnea has been present for the last couple weeks. The hospitalists seen obtain an ultrasound of the right upper quadrant evidence of mild hyperbilirubinemia.  I have personally reviewed the ultrasound showing evidence of stones.  No evidence of biliary dilation.  There is some pericholecystic fluid.  It was normal.  Globin 1 AC is 6.8 left ear shows an albumin of 2.5 a total bilirubin of 2.4 and a direct bilirubin of 1.0.  Alkaline  phosphatase is normal. He reports some occasional intermittent abdominal pain that gets worse with fatty meals.  No fevers no chills no evidence of biliary obstruction.  She specifically states that she does not have any abdominal pain at this time.  She is actually in her bed eating lunch. She is currently on Plavix  HPI  Past Medical History:  Diagnosis Date  . Anxiety   . Arthritis   . Asthma   . Bronchitis   . CHF (congestive heart failure) (HCC)    a. EF 20-25% by cath in 11/2010 with cath showing normal cors  . CKD (chronic kidney disease) stage 3, GFR 30-59 ml/min (HCC)   . Fibromyalgia   . GERD (gastroesophageal reflux disease)   . Hallux valgus of left foot   . Hay fever   . Headache(784.0)   . History of pneumonia   . History of seizures   . Hyperlipidemia   . Hypertension   . Nonischemic cardiomyopathy (HCC)   . Obesity   . Stroke (HCC)   . Systemic lupus erythematosus (HCC)   . Type 2 diabetes mellitus (HCC)     Past Surgical History:  Procedure Laterality Date  . ICD  02/13/2011   Medtronic Protecta DR XT  . ICD GENERATOR CHANGEOUT N/A 01/07/2017   Procedure: ICD GENERATOR CHANGEOUT;  Surgeon: Marinus Maw, MD;  Location: [facility] INVASIVE CV LAB;  Service: Cardiovascular;  Laterality: N/A;  . IMPLANTABLE CARDIOVERTER DEFIBRILLATOR IMPLANT N/A 02/13/2011   Procedure: IMPLANTABLE CARDIOVERTER DEFIBRILLATOR IMPLANT;  Surgeon: Thurmon Fair, MD;  Location: [facility] CATH LAB;  Service: Cardiovascular;  Laterality: N/A;  . LEFT HEART CATHETERIZATION WITH CORONARY ANGIOGRAM N/A 12/05/2010   Procedure: LEFT HEART CATHETERIZATION WITH CORONARY ANGIOGRAM;  Surgeon: Chrystie Nose, MD;  Location: [facility] CATH LAB;  Service: Cardiovascular;  Laterality: N/A;  . RIGHT ANKLE    . TUBAL LIGATION    . US ECHOCARDIOGRAPHY  11/09/2010   mild LV enlargement w/conc. LVH & severe global hypokinesis EF 30-35%,mod. diastolic dysfunction, mod. TR,mild AI,mild to mod MR,mild PI    Family History  Problem Relation Age of Onset  . Heart failure Mother   . Diabetes Father   . Hypertension Sister   . Hypertension Brother   . Hypertension Daughter     Social History Social History   Tobacco Use  . Smoking status: Former Smoker    Packs/day: 1.00    Years: 26.00    Pack years: 26.00    Types: Cigarettes    Start date: 07/01/1979    Last attempt to quit: 10/03/2005    Years since quitting: 12.4  . Smokeless tobacco: Never Used  Substance Use Topics  .  Alcohol use: Not Currently    Alcohol/week: 0.0 standard drinks    Comment: quit 2007  . Drug use: No    Allergies  Allergen Reactions  . Aspirin Other (See Comments)  . Penicillins Itching    Can take amoxicillin without a reaction  . Shellfish Allergy Other (See Comments)    Causes to have grand mal seizures    Current Facility-Administered Medications  Medication Dose Route Frequency Provider Last Rate Last Dose  . 0.9 %  sodium chloride infusion  250 mL Intravenous PRN Alford Highland, MD      . acetaminophen (TYLENOL) tablet 650 mg  650 mg Oral Q4H PRN Wieting, Richard, MD      . albuterol (PROVENTIL) (2.5 MG/3ML) 0.083% nebulizer solution 2.5 mg  2.5  mg Nebulization Q6H PRN Wieting, Richard, MD      . amiodarone (PACERONE) tablet 200 mg  200 mg Oral Daily Alford Highland, MD   200 mg at 03/20/18 1200  . carvedilol (COREG) tablet 6.25 mg  6.25 mg Oral BID WC Alford Highland, MD   6.25 mg at 03/20/18 0919  . chlorhexidine (PERIDEX) 0.12 % solution 15 mL  15 mL Mouth Rinse BID Delfino Lovett, MD   15 mL at 03/20/18 1159  . clopidogrel (PLAVIX) tablet 75 mg  75 mg Oral Daily Alford Highland, MD   75 mg at 03/20/18 1200  . enoxaparin (LOVENOX) injection 40 mg  40 mg Subcutaneous Q24H Wieting, Richard, MD   40 mg at 03/19/18 1818  . furosemide (LASIX) injection 40 mg  40 mg Intravenous BID Alford Highland, MD   40 mg at 03/20/18 0920  . hydrALAZINE (APRESOLINE) tablet 20 mg  20 mg Oral Q8H Alford Highland, MD   20 mg at 03/19/18 2301  . hydrocortisone cream 0.5 %   Topical TID PRN Raliegh Scarlet, NP      . hydroxychloroquine (PLAQUENIL) tablet 200 mg  200 mg Oral BID Alford Highland, MD   200 mg at 03/20/18 1200  . insulin aspart (novoLOG) injection 0-5 Units  0-5 Units Subcutaneous QHS Alford Highland, MD   2 Units at 03/19/18 2302  . insulin aspart (novoLOG) injection 0-9 Units  0-9 Units Subcutaneous TID WC Alford Highland, MD   1 Units at 03/20/18 1159  . insulin aspart protamine- aspart (NOVOLOG MIX 70/30) injection 10 Units  10 Units Subcutaneous BID WC Alford Highland, MD   10 Units at 03/20/18 0919  . isosorbide mononitrate (IMDUR) 24 hr tablet 30 mg  30 mg Oral Daily Alford Highland, MD   30 mg at 03/20/18 1200  . lisinopril (PRINIVIL,ZESTRIL) tablet 5 mg  5 mg Oral Daily Alford Highland, MD   5 mg at 03/20/18 1200  . loratadine (CLARITIN) tablet 10 mg  10 mg Oral Daily Alford Highland, MD   10 mg at 03/20/18 1200  . MEDLINE mouth rinse  15 mL Mouth Rinse q12n4p Delfino Lovett, MD   15 mL at 03/20/18 1159  . mometasone-formoterol (DULERA) 200-5 MCG/ACT inhaler 2 puff  2 puff Inhalation BID Alford Highland, MD   2 puff at 03/20/18 1159   . multivitamin with minerals tablet 1 tablet  1 tablet Oral Daily Alford Highland, MD   1 tablet at 03/20/18 1159  . ondansetron (ZOFRAN) injection 4 mg  4 mg Intravenous Q6H PRN Wieting, Richard, MD      . oxyCODONE-acetaminophen (PERCOCET/ROXICET) 5-325 MG per tablet 1 tablet  1 tablet Oral Q6H PRN Alford Highland, MD   1 tablet at  03/19/18 1923   And  . oxyCODONE (Oxy IR/ROXICODONE) immediate release tablet 5 mg  5 mg Oral Q6H PRN Alford Highland, MD   5 mg at 03/20/18 0237  . polyethylene glycol (MIRALAX / GLYCOLAX) packet 17 g  17 g Oral Daily PRN Wieting, Richard, MD      . potassium chloride (KLOR-CON) packet 40 mEq  40 mEq Oral BID Alford Highland, MD   40 mEq at 03/20/18 1208  . sodium chloride flush (NS) 0.9 % injection 3 mL  3 mL Intravenous Once Wieting, Richard, MD      . sodium chloride flush (NS) 0.9 % injection 3 mL  3 mL Intravenous Q12H Alford Highland, MD   3 mL at 03/20/18 1201  . sodium chloride flush (NS) 0.9 % injection 3 mL  3 mL Intravenous PRN Alford Highland, MD      . venlafaxine XR (EFFEXOR-XR) 24 hr capsule 150 mg  150 mg Oral Daily Alford Highland, MD   150 mg at 03/20/18 1200     Review of Systems Full ROS  was asked and was negative except for the information on the HPI  Physical Exam Blood pressure 127/81, pulse 81, temperature (!) 97.4 F (36.3 C), temperature source Oral, resp. rate 20, height 6' (1.829 m), weight 122.5 kg, last menstrual period 01/30/2011, SpO2 100 %. CONSTITUTIONAL: Obese female , pale and w some dyspnea EYES: Pupils are equal, round, and reactive to light, Sclera are non-icteric. EARS, NOSE, MOUTH AND THROAT: The oropharynx is clear. The oral mucosa is pink and moist. Hearing is intact to voice. LYMPH NODES:  Lymph nodes in the neck are normal. RESPIRATORY:  Lungs are clear. There is normal respiratory effort, with equal breath sounds bilaterally, and without pathologic use of accessory muscles. CARDIOVASCULAR: Heart is regular  without murmurs, gallops, or rubs. GI: The abdomen is  soft, nontender, and nondistended. There are no palpable masses. There is no hepatosplenomegaly. There are normal bowel sounds in all quadrants. No peritonitis or Murphy sign GU: Rectal deferred.   MUSCULOSKELETAL: Normal muscle strength and tone. Pedal edema up to the knees  SKIN: Turgor is good and there are no pathologic skin lesions or ulcers. NEUROLOGIC: Motor and sensation is grossly normal. Cranial nerves are grossly intact. PSYCH:  Oriented to person, place and time. Affect is normal.  Data Reviewed  I have personally reviewed the patient's imaging, laboratory findings and medical records.    Assessment/  Plan 58 year old female with exacerbation of heart failure and gallstones.  SHe may seem that she may have some mild biliary colic but no evidence of acute cholecystitis.  Likely the mild increase of the bili  is related to cholestasis from her  heart failure.  Very low suspicious for any evidence of choledocholithiasis.  No Evidence of biliary obstruction or cholangitis at this time.  I had a lengthy discussion with the patient and I will suggest outpatient follow-up.  Currently there is no need for any emergent surgical intervention at this time.  I do not necessarily think that a HIDA scan is indicated in this situation as this may obscure our clinical judgment. I discussed my findings with the referring provider  Sterling Big, MD FACS General Surgeon 03/20/2018, 2:29 PM

## 2018-03-20 NOTE — Progress Notes (Addendum)
CCMD called and reported that pt just have a 7 beats of V-Tach. Page prime. Will continue to monitor.  Update 0650: NP Sidney ordered to give the the 1000 AM scheduled potassium now. Will notify incoming shift. Will continue to monitor.

## 2018-03-20 NOTE — Plan of Care (Signed)
  Problem: Clinical Measurements: Goal: Respiratory complications will improve Outcome: Progressing   

## 2018-03-20 NOTE — Consult Note (Signed)
Pharmacy Electrolyte Monitoring Consult:  Pharmacy consulted to assist in monitoring and replacing electrolytes in this 58 y.o. female admitted on 03/19/2018 with Shortness of Breath and Weakness   Labs:  Sodium (mmol/L)  Date Value  03/20/2018 141  03/14/2018 143   Potassium (mmol/L)  Date Value  03/20/2018 3.8   Magnesium (mg/dL)  Date Value  00/37/0488 1.9   Calcium (mg/dL)  Date Value  89/16/9450 8.2 (L)   Albumin (g/dL)  Date Value  38/88/2800 2.5 (L)    Assessment/Plan: Electrolyte Goals: Potassium ~4.0  Patient received KCl 40 mEq @ 1208. Scheduled to receive another dose at 1600. Patient has furosemide 80 mg IV BID ordered.   Potassium @ 2000 - 3.8 - WNL's  Will order electrolytes with AM labs.  Pharmacy will continue to monitor.  Albina Billet, PharmD, BCPS Clinical Pharmacist 03/20/2018 9:33 PM

## 2018-03-20 NOTE — Plan of Care (Signed)
  Problem: Education: Goal: Knowledge of General Education information will improve Description: Including pain rating scale, medication(s)/side effects and non-pharmacologic comfort measures Outcome: Progressing   Problem: Clinical Measurements: Goal: Respiratory complications will improve Outcome: Progressing   Problem: Safety: Goal: Ability to remain free from injury will improve Outcome: Progressing   

## 2018-03-20 NOTE — Progress Notes (Addendum)
[facility] [place] [facility]   PATIENT NAME: Katherine [name]    MR#:  [id number]  DATE OF BIRTH:  July 31, 1960  SUBJECTIVE:  CHIEF COMPLAINT:   Chief Complaint  Patient presents with  . Shortness of Breath  . Weakness   Overnight: 7 beats of V-Tach. NP ordered for potassium to be given early.   Eating breakfast when seen. Feels shortness of breath is "much better". Experiences nausea with associated RUQ abdominal pain after meals. No emesis. This is not a new problem. "Tired". Swelling bilaterally with L>R consistently historically as well. + cough, not productive. Some soreness in mouth and tongue that has been responding to mouth rinse. Not a new problem.  REVIEW OF SYSTEMS:  CONSTITUTIONAL: Negative for chills, diaphoresis, and fever today.  Positive for fatigue.  EYES: Occasional blurred vision. EARS, NOSE, AND THROAT: No tinnitus or ear pain.  Positive for sore throat and sores in the mouth with lupus. RESPIRATORY: Positive for cough, shortness of breath.  No hemoptysis. No wheezing today.  CARDIOVASCULAR: No chest pain, positive for lower extremity edema. Positive for orthopnea, PND. Negative for palpitations. GASTROINTESTINAL: Positive for nausea, vomiting.  Alternating constipation and diarrhea.  No abdominal pain. No blood in bowel movements GENITOURINARY: No dysuria, hematuria.  ENDOCRINE: No polyuria, nocturia,  HEMATOLOGY: No anemia, easy bruising or bleeding SKIN: Positive for dry skin and itching MUSCULOSKELETAL: Positive for joint pain in feet ankles and legs.   NEUROLOGIC: No tingling, numbness, weakness.  PSYCHIATRY: Some depression but no thoughts of hurting herself or other people. DRUG ALLERGIES:   Allergies  Allergen Reactions  . Aspirin Other (See Comments)  . Penicillins Itching    Can take amoxicillin without a reaction  . Shellfish Allergy Other (See Comments)    Causes to have grand mal seizures   VITALS:  Blood pressure  (!) 87/69, pulse 80, temperature 98.2 F (36.8 C), temperature source Oral, resp. rate 20, height 6' (1.829 m), weight 122.5 kg, last menstrual period 01/30/2011, SpO2 100 %. PHYSICAL EXAMINATION:  GENERAL:  58 y.o.-year-old patient lying in the bed in no acute distress.  EYES: Pupils equal, round, reactive to light and accommodation. No scleral icterus. Extraocular muscles intact.  HEENT: Head atraumatic, normocephalic. Oropharynx and nasopharynx clear.  NECK:  Supple, no jugular venous distention. No thyroid enlargement, no tenderness.  LUNGS: Decreased breath sounds bilaterally, no wheezing.  Positive rales at lung bases. No use of accessory muscles of respiration.  CARDIOVASCULAR: S1, S2 normal. No murmurs, rubs, or gallops. ABDOMEN: Soft, nontender, nondistended. Bowel sounds present. No organomegaly or mass.  EXTREMITIES: 2+ pitting edema. no cyanosis, or clubbing.  NEUROLOGIC: Cranial nerves II through XII are intact. Muscle strength 5/5 in all extremities. Sensation intact. Gait not checked.  PSYCHIATRIC: The patient is alert and oriented x 3.  SKIN: No rash, lesion, or ulcer.  LABORATORY PANEL:  Female CBC Recent Labs  Lab 03/20/18 0410  WBC 4.5  HGB 11.9*  HCT 37.6  PLT 232   ------------------------------------------------------------------------------------------------------------------ Chemistries  Recent Labs  Lab 03/19/18 0744 03/20/18 0410  NA 141 141  K 2.3* 2.9*  CL 105 106  CO2 24 27  GLUCOSE 72 91  BUN 23* 21*  CREATININE 1.14* 1.11*  CALCIUM 8.5* 8.2*  MG 1.9  --   AST 38 31  ALT 29 24  ALKPHOS 134* 112  BILITOT 3.1* 2.4*   I/O last 3 completed shifts: In: -  Out: 900 [Urine:900] Total I/O In: 480 [P.O.:480] Out: -  RADIOLOGY:  Dg Chest 2 View  Result Date: 03/19/2018 CLINICAL DATA:  Weakness for several months EXAM: CHEST - 2 VIEW COMPARISON:  03/15/2017 FINDINGS: Cardiac shadow remains enlarged. Defibrillator is again noted and stable. The  lungs are well aerated bilaterally without focal infiltrate or sizable effusion. Previously seen vascular congestion has resolved in the interval. No bony abnormality is seen. IMPRESSION: Stable cardiomegaly.  No acute abnormality noted. Electronically Signed   By: Alcide Clever M.D.   On: 03/19/2018 07:40   US Abdomen Limited Ruq  Result Date: 03/19/2018 CLINICAL DATA:  Elevated bilirubin EXAM: ULTRASOUND ABDOMEN LIMITED RIGHT UPPER QUADRANT COMPARISON:  None. FINDINGS: Gallbladder: Gallbladder is filled with cholelithiasis. Gallbladder wall thickness measures approximately 6 mm. Largest calculus measures 12 mm. No pericholecystic fluid. Negative sonographic Murphy sign. Common bile duct: Diameter: 4 mm Liver: No focal lesion identified. Increased hepatic parenchymal echogenicity. Portal vein is patent on color Doppler imaging with normal direction of blood flow towards the liver. IMPRESSION: 1. Gallbladder filled with cholelithiasis with mild gallbladder wall thickening, but without pericholecystic fluid or a sonographic Murphy sign. No sonographic evidence of acute cholecystitis. 2. Increased hepatic echogenicity as can be seen with hepatic steatosis. Electronically Signed   By: Elige Ko   On: 03/19/2018 12:55   ASSESSMENT AND PLAN:   Katherine [name]  is a 58 y.o. female with a known history of systolic congestive heart failure, systemic lupus erythematosus, nonischemic cardiomyopathy, hypertension, hyperlipidemia, CKD stage III, GERD, and stroke who presented with trouble breathing going on since Tuesday or Wednesday.  She complained of her leg swelling. Weight going up and down.  Has not been walking much in the past 6 months. Occasionally has a cough with white-ish phlegm. Occasionally has some wheezing. She states that her right leg is been heavy since she had a stroke. Hospitalist were contacted for admission for congestive heart failure.  Last echocardiogram 01/13/2018  showed an EF of 20 to  25%. Last admission for heart failure exacerbation 01/11/2018.  Has an ICD implanted probably 2012 with generator replacement 2018.  Followed by Dr. Sharrell Ku.  Plan:  1.  Acute on chronic systolic congestive heart failure.  Lasix 40 mg IV twice daily.  Patient already on Coreg and lisinopril.  aldactone was discontinued at [facility] previously due to renal insufficiency. cardiology resumed Aldactone today.  Monitor renal function. Cardiology/Dr. Graciela Husbands consulted, appreciate input.  CM consulted for heart failure clinic referral. Form signed.  2.  Hypokalemia. 2.9 today. Replace potassium orally 3 times today. Already ordered. Previous magnesium was low so IV magnesium given. Last Mg 1.9. Morning BMP & Mg to monitor. Aldactone as above.  3.  SLE. Per patient cutaneous manifestations only. Continue Plaquenil  4.  Fever and chills at home sent off flu swab and respiratory panel on admit: negative.   5.  Type 2 diabetes mellitus on Humalog 50/50 twice daily.  Will also put on sliding scale.  6.  Depression on Effexor. Monitor.  7.  COPD.  Continue inhalers. She explains that she was using her maintenance inhaler as a rescue inhaler at home rather than as scheduled with albuterol for rescue. Counseled and will continue to educate and counsel, and at discharge.   8.  Obesity with a BMI of 36.62.  Weight loss needed.   9.  History of stroke on Plavix  10. Headache, received IV magnesium, resolved   11. Weakness - physical therapy evaluation, will see when she is more medically-stable per notes  12.  Elevated bilirubin with nausea vomiting - right upper quadrant ultrasound ordered by admitting MD General Surgery/Dr. Everlene Farrier consulted: impression is of cholelithiasis with possible mild biliary colic, bilirubin increase likely due to cholestasis from heart failure. No HIDA scan indicated. No surgical intervention immediately indicated. Outpatient follow-up.  13. History Nonsustained VT:  amiodarone discontinued by cardiology as it was poorly tolerated in the past. 7 beats VTach this AM. Potassium given early. Continue telemetry.  14. Probable sleep apnea: will follow with cardiology, consider outpatient sleep study at D/C  15. Anemia on plaquenil. Monitor CBC.   DVT Prophylaxis: subQ Lovenox    All the records are reviewed and case is discussed with Care Management/Social Worker. Management plans discussed with the patient and/or family and they are in agreement.  CODE STATUS: Full Code  TOTAL TIME TAKING CARE OF THIS PATIENT: 30 minutes.   More than 50% of the time was spent in counseling/coordination of care: YES  POSSIBLE D/C IN 2-3 DAYS, DEPENDING ON CLINICAL CONDITION.   Stormy Fabian PA-C on 03/20/2018 at 5:34 PM  Between 7am to 6pm - Pager - [phone]  After 6 pm go to [email or web address] - Social research officer, government  [facility] [place] [facility]  Office  [phone]  CC: Primary care physician; Alvina Filbert, MD  Note: This dictation was prepared with Dragon dictation along with smaller phrase technology. Any transcriptional errors that result from this process are unintentional.

## 2018-03-20 NOTE — Care Management Note (Signed)
Case Management Note  Patient Details  Name: Katherine [name] MRN: [id number] Date of Birth: 01-09-1961  Subjective/Objective:   RNCM team asked to consult to assist with heart failure protocol. Patient lives alone and is independent at baseline with activities of daily living. Patient on acute oxygen. Patient able to get transportation to appointments from friends. Patient sees a cardiologist but unsure if she follows directly at the Heart Failure clinic. Has scales in home. Agreeable to CHF protocol for the home. Referral form signed by MD. Katherine [name] at [facility] aware of referral. PCP is Katherine [name].                   Action/Plan:   Expected Discharge Date:  03/23/18               Expected Discharge Plan:     In-House Referral:     Discharge planning Services  CM Consult  Post Acute Care Choice:  Home Health Choice offered to:     DME Arranged:    DME Agency:     HH Arranged:  RN HH Agency:  [facility] ([facility]  Status of Service:  In process, will continue to follow  If discussed at Long Length of Stay Meetings, dates discussed:    Additional Comments:  Virgel Manifold, RN 03/20/2018, 1:29 PM

## 2018-03-20 NOTE — Consult Note (Addendum)
Cardiology Consultation:   Patient ID: Katherine [name] MRN: [id number]; DOB: Aug 07, 1960  Admit date: 03/19/2018 Date of Consult: 03/20/2018  Primary Care Provider: Alvina Filbert, MD Primary Cardiologist: Katherine Bunting, MD   Primary Electrophysiologist:  None     Patient Profile:   Katherine [name] is a 58 y.o. female with a hx of nonischemic cardiomyopathy who is being seen today for the evaluation of congestive heart failure at the request of Katherine [name].  History of Present Illness:   [name] admitted with congestive heart failure and shortness of breath. \  There is something wrong with her medical record.  I find no notes by Katherine [name] who is followed her for 6 years.  She was admitted to [facility] Christmas eve.  Because of increased renal insufficiency, her Aldactone ACE inhibitor and ARB as were discontinued.  She had also previously been on amiodarone for nonsustained VT which was discontinued because of nausea.  Treated previously with hydralazine/nitrates, metoprolol.  Also on Plavix for question indication.     She is admitted again with shortness of breath peripheral edema orthopnea nocturnal dyspnea.  She has not walked well she says in 6 months.  She has a left foot orthopedic problem.  Sleep disordered breathing with daytime somnolence  DATE TEST EF    LHC  Cors normal   10/12 Echo   30-35 %   12/12 cMRI  28 % DGE neg  12/19 Echo  20-25%    Has an ICD implanted probably 2012 with generator replacement 2018.  Followed by Katherine [name]; however, no notes are available in epic.  Complex past medical history including systemic lupus which she says is only cutaneous on immunosuppressive therapy,  Past Medical History:  Diagnosis Date  . Anxiety   . Arthritis   . Asthma   . Bronchitis   . CHF (congestive heart failure) (HCC)    a. EF 20-25% by cath in 11/2010 with cath showing normal cors  . CKD (chronic kidney disease) stage 3, GFR 30-59 ml/min (HCC)   .  Fibromyalgia   . GERD (gastroesophageal reflux disease)   . Hallux valgus of left foot   . Hay fever   . Headache(784.0)   . History of pneumonia   . History of seizures   . Hyperlipidemia   . Hypertension   . Nonischemic cardiomyopathy (HCC)   . Obesity   . Stroke (HCC)   . Systemic lupus erythematosus (HCC)   . Type 2 diabetes mellitus (HCC)     Past Surgical History:  Procedure Laterality Date  . ICD  02/13/2011   Medtronic Protecta DR XT  . ICD GENERATOR CHANGEOUT N/A 01/07/2017   Procedure: ICD GENERATOR CHANGEOUT;  Surgeon: Katherine Maw, MD;  Location[facility] INVASIVE CV LAB;  Service: Cardiovascular;  Laterality: N/A;  . IMPLANTABLE CARDIOVERTER DEFIBRILLATOR IMPLANT N/A 02/13/2011   Procedure: IMPLANTABLE CARDIOVERTER DEFIBRILLATOR IMPLANT;  Surgeon: Katherine Fair, MD;  Location: [facility] CATH LAB;  Service: Cardiovascular;  Laterality: N/A;  . LEFT HEART CATHETERIZATION WITH CORONARY ANGIOGRAM N/A 12/05/2010   Procedure: LEFT HEART CATHETERIZATION WITH CORONARY ANGIOGRAM;  Surgeon: Katherine Nose, MD;  Location: [facility] CATH LAB;  Service: Cardiovascular;  Laterality: N/A;  . RIGHT ANKLE    . TUBAL LIGATION    . US ECHOCARDIOGRAPHY  11/09/2010   mild LV enlargement w/conc. LVH & severe global hypokinesis EF 30-35%,mod. diastolic dysfunction, mod. TR,mild AI,mild to mod MR,mild PI     Inpatient Medications: Scheduled Meds: . amiodarone  200 mg Oral Daily  . carvedilol  6.25 mg Oral BID WC  . chlorhexidine  15 mL Mouth Rinse BID  . clopidogrel  75 mg Oral Daily  . enoxaparin (LOVENOX) injection  40 mg Subcutaneous Q24H  . furosemide  40 mg Intravenous BID  . hydrALAZINE  20 mg Oral Q8H  . hydroxychloroquine  200 mg Oral BID  . insulin aspart  0-5 Units Subcutaneous QHS  . insulin aspart  0-9 Units Subcutaneous TID WC  . insulin aspart protamine- aspart  10 Units Subcutaneous BID WC  . isosorbide mononitrate  30 mg Oral Daily  . lisinopril  5 mg Oral Daily  . loratadine  10  mg Oral Daily  . mouth rinse  15 mL Mouth Rinse q12n4p  . mometasone-formoterol  2 puff Inhalation BID  . multivitamin with minerals  1 tablet Oral Daily  . potassium chloride  40 mEq Oral BID  . sodium chloride flush  3 mL Intravenous Once  . sodium chloride flush  3 mL Intravenous Q12H  . venlafaxine XR  150 mg Oral Daily   Continuous Infusions: . sodium chloride     PRN Meds: sodium chloride, acetaminophen, albuterol, hydrocortisone cream, ondansetron (ZOFRAN) IV, oxyCODONE-acetaminophen **AND** oxyCODONE, polyethylene glycol, sodium chloride flush  Allergies:    Allergies  Allergen Reactions  . Aspirin Other (See Comments)  . Penicillins Itching    Can take amoxicillin without a reaction  . Shellfish Allergy Other (See Comments)    Causes to have grand mal seizures    Social History:   Social History   Socioeconomic History  . Marital status: Legally Separated    Spouse name: Not on file  . Number of children: Not on file  . Years of education: Not on file  . Highest education level: Not on file  Occupational History  . Not on file  Social Needs  . Financial resource strain: Not on file  . Food insecurity:    Worry: Not on file    Inability: Not on file  . Transportation needs:    Medical: Not on file    Non-medical: Not on file  Tobacco Use  . Smoking status: Former Smoker    Packs/day: 1.00    Years: 26.00    Pack years: 26.00    Types: Cigarettes    Start date: 07/01/1979    Last attempt to quit: 10/03/2005    Years since quitting: 12.4  . Smokeless tobacco: Never Used  Substance and Sexual Activity  . Alcohol use: Not Currently    Alcohol/week: 0.0 standard drinks    Comment: quit 2007  . Drug use: No  . Sexual activity: Yes  Lifestyle  . Physical activity:    Days per week: Not on file    Minutes per session: Not on file  . Stress: Not on file  Relationships  . Social connections:    Talks on phone: Not on file    Gets together: Not on file      Attends religious service: Not on file    Active member of club or organization: Not on file    Attends meetings of clubs or organizations: Not on file    Relationship status: Not on file  . Intimate partner violence:    Fear of current or ex partner: Not on file    Emotionally abused: Not on file    Physically abused: Not on file    Forced sexual activity: Not on file  Other Topics Concern  .  Not on file  Social History Narrative  . Not on file    Family History:     Family History  Problem Relation Age of Onset  . Heart failure Mother   . Diabetes Father   . Hypertension Sister   . Hypertension Brother   . Hypertension Daughter      ROS:  Please see the history of present illness.   All other ROS reviewed and negative.     Physical Exam/Data:   Vitals:   03/20/18 0615 03/20/18 0639 03/20/18 0826 03/20/18 1145  BP: 104/71 120/76 101/88 127/81  Pulse: 73 77 78 81  Resp: 18  20   Temp: 97.7 F (36.5 C)  (!) 97.4 F (36.3 C)   TempSrc: Oral  Oral   SpO2: 100% 100% 100%   Weight:      Height:        Intake/Output Summary (Last 24 hours) at 03/20/2018 1435 Last data filed at 03/20/2018 1334 Gross per 24 hour  Intake 480 ml  Output 200 ml  Net 280 ml   Last 3 Weights 03/19/2018 01/24/2018 01/23/2018  Weight (lbs) 270 lb 265 lb 10.5 oz 262 lb 6.4 oz  Weight (kg) 122.471 kg 120.5 kg 119.024 kg     Body mass index is 36.62 kg/m.  General:  Morbidly obese AA female in no acute distress  HEENT: normal Lymph: no adenopathy Neck: > 10 but difficult to be sure  Endocrine:  No thryomegaly Vascular: No carotid bruits; FA pulses 2+ bilaterally without bruits  Cardiac:  normal S1, S2; RRR; no murmur   Lungs:  clear to auscultation bilaterally, no wheezing, rhonchi or rales  Abd: soft, nontender, no hepatomegaly  Ext:2+ edema Musculoskeletal:  No deformities, BUE and BLE strength normal and equal Skin: warm and dry  Neuro:  CNs 2-12 intact, no focal abnormalities  noted Psych:  Normal affect   EKG:  The EKG was personally reviewed and demonstrates: Sinus rhythm with an IVCD that is quite left bundle branch like.  QRS duration is 152 ms.  There is however a paucity of notching the anterior precordium Telemetry:  Telemetry was personally reviewed and demonstrates:   Sinus rhythm  Relevant CV Studies: Echo 12/19.  Mild LVH.  EF 20-25%.  Biatrial enlargement.  Laboratory Data:  Chemistry Recent Labs  Lab 03/14/18 1702 03/19/18 0744 03/20/18 0410  NA 143 141 141  K 2.6* 2.3* 2.9*  CL 104 105 106  CO2 22 24 27   GLUCOSE 121* 72 91  BUN 27* 23* 21*  CREATININE 1.94* 1.14* 1.11*  CALCIUM 7.7* 8.5* 8.2*  GFRNONAA 28* 53* 55*  GFRAA 32* >60 >60  ANIONGAP  --  12 8    Recent Labs  Lab 03/19/18 0744 03/20/18 0410  PROT 7.4 6.3*  ALBUMIN 2.9* 2.5*  AST 38 31  ALT 29 24  ALKPHOS 134* 112  BILITOT 3.1* 2.4*   Hematology Recent Labs  Lab 03/19/18 0656 03/20/18 0410  WBC 5.2 4.5  RBC 4.38 3.90  HGB 13.4 11.9*  HCT 41.4 37.6  MCV 94.5 96.4  MCH 30.6 30.5  MCHC 32.4 31.6  RDW 20.1* 20.2*  PLT 251 232   Cardiac Enzymes Recent Labs  Lab 03/19/18 0744  TROPONINI <0.03   No results for input(s): TROPIPOC in the last 168 hours.  BNP Recent Labs  Lab 03/19/18 0702  BNP >4,500.0*    DDimer No results for input(s): DDIMER in the last 168 hours.  Radiology/Studies:  Dg Chest  2 View  Result Date: 03/19/2018 CLINICAL DATA:  Weakness for several months EXAM: CHEST - 2 VIEW COMPARISON:  03/15/2017 FINDINGS: Cardiac shadow remains enlarged. Defibrillator is again noted and stable. The lungs are well aerated bilaterally without focal infiltrate or sizable effusion. Previously seen vascular congestion has resolved in the interval. No bony abnormality is seen. IMPRESSION: Stable cardiomegaly.  No acute abnormality noted. Electronically Signed   By: Alcide Clever M.D.   On: 03/19/2018 07:40   US Abdomen Limited Ruq  Result Date:  03/19/2018 CLINICAL DATA:  Elevated bilirubin EXAM: ULTRASOUND ABDOMEN LIMITED RIGHT UPPER QUADRANT COMPARISON:  None. FINDINGS: Gallbladder: Gallbladder is filled with cholelithiasis. Gallbladder wall thickness measures approximately 6 mm. Largest calculus measures 12 mm. No pericholecystic fluid. Negative sonographic Murphy sign. Common bile duct: Diameter: 4 mm Liver: No focal lesion identified. Increased hepatic parenchymal echogenicity. Portal vein is patent on color Doppler imaging with normal direction of blood flow towards the liver. IMPRESSION: 1. Gallbladder filled with cholelithiasis with mild gallbladder wall thickening, but without pericholecystic fluid or a sonographic Murphy sign. No sonographic evidence of acute cholecystitis. 2. Increased hepatic echogenicity as can be seen with hepatic steatosis. Electronically Signed   By: Elige Ko   On: 03/19/2018 12:55    Assessment and Plan:   Nonischemic cardiomyopathy  Congestive heart failure acute/chronic/class IIIb  Ventricular tachycardia-nonsustained  Hypokalemia  Morbid obesity  Sleep apnea-probable  Nonambulatory-largely  CVA with L sided residual weakness  Chronic K disease   Will need potassium repletion.  Continue IV diuresis.  We will discontinue amiodarone as it is been poorly tolerated in the past and there are not good data with ability to improve outcome  We will try the resumption of Aldactone.  This will help with her hypokalemia will need to keep a close track on her renal function   Lupus-? solely cutaneous?    For questions or updates, please contact [facility] Please consult [email or web address] for contact info under     Signed, Sherryl Manges, MD  03/20/2018 2:35 PM

## 2018-03-20 NOTE — Progress Notes (Signed)
PT Cancellation Note  Patient Details Name: Katherine [name] MRN: [id number] DOB: 02/12/1960   Cancelled Treatment:    Reason Eval/Treat Not Completed: Medical issues which prohibited therapy. Per protocol therapy to be held as potassium is 2.9 and pt had 7 beats of Vtach this morning. PT will monitor acutely and treat when pt medically stable and POC established.    Emeline Gins, SPT  03/20/2018, 8:50 AM

## 2018-03-20 NOTE — Consult Note (Signed)
Pharmacy Electrolyte Monitoring Consult:  Pharmacy consulted to assist in monitoring and replacing electrolytes in this 58 y.o. female admitted on 03/19/2018 with Shortness of Breath and Weakness   Labs:  Sodium (mmol/L)  Date Value  03/20/2018 141  03/14/2018 143   Potassium (mmol/L)  Date Value  03/20/2018 2.9 (L)   Magnesium (mg/dL)  Date Value  07/15/9483 1.9   Calcium (mg/dL)  Date Value  46/27/0350 8.2 (L)   Albumin (g/dL)  Date Value  09/38/1829 2.5 (L)    Assessment/Plan: Electrolyte Goals: Potassium ~4.0  Patient received KCl 40 mEq @ 1208. Scheduled to receive another dose at 1600. Patient has furosemide 80 mg IV BID ordered. Will re-check K at 2000.  Will order electrolytes with AM labs.  Pharmacy will continue to monitor.   Mauri Reading, PharmD Pharmacy Resident  03/20/2018 3:04 PM

## 2018-03-20 NOTE — Consult Note (Signed)
See separate note.

## 2018-03-21 DIAGNOSIS — I5023 Acute on chronic systolic (congestive) heart failure: Secondary | ICD-10-CM

## 2018-03-21 LAB — CBC
HCT: 37.3 % (ref 36.0–46.0)
Hemoglobin: 11.9 g/dL — ABNORMAL LOW (ref 12.0–15.0)
MCH: 30.6 pg (ref 26.0–34.0)
MCHC: 31.9 g/dL (ref 30.0–36.0)
MCV: 95.9 fL (ref 80.0–100.0)
NRBC: 1.3 % — AB (ref 0.0–0.2)
Platelets: 218 10*3/uL (ref 150–400)
RBC: 3.89 MIL/uL (ref 3.87–5.11)
RDW: 19.6 % — ABNORMAL HIGH (ref 11.5–15.5)
WBC: 4.7 10*3/uL (ref 4.0–10.5)

## 2018-03-21 LAB — COMPREHENSIVE METABOLIC PANEL
ALT: 23 U/L (ref 0–44)
AST: 29 U/L (ref 15–41)
Albumin: 2.6 g/dL — ABNORMAL LOW (ref 3.5–5.0)
Alkaline Phosphatase: 119 U/L (ref 38–126)
Anion gap: 8 (ref 5–15)
BUN: 24 mg/dL — AB (ref 6–20)
CO2: 24 mmol/L (ref 22–32)
CREATININE: 1.26 mg/dL — AB (ref 0.44–1.00)
Calcium: 8.6 mg/dL — ABNORMAL LOW (ref 8.9–10.3)
Chloride: 107 mmol/L (ref 98–111)
GFR calc Af Amer: 55 mL/min — ABNORMAL LOW (ref >60)
GFR, EST NON AFRICAN AMERICAN: 47 mL/min — AB (ref >60)
Glucose, Bld: 121 mg/dL — ABNORMAL HIGH (ref 70–99)
Potassium: 3.7 mmol/L (ref 3.5–5.1)
Sodium: 139 mmol/L (ref 135–145)
Total Bilirubin: 2.3 mg/dL — ABNORMAL HIGH (ref 0.3–1.2)
Total Protein: 6.7 g/dL (ref 6.5–8.1)

## 2018-03-21 LAB — GLUCOSE, CAPILLARY
Glucose-Capillary: 82 mg/dL (ref 70–99)
Glucose-Capillary: 118 mg/dL — ABNORMAL HIGH (ref 70–99)
Glucose-Capillary: 122 mg/dL — ABNORMAL HIGH (ref 70–99)
Glucose-Capillary: 133 mg/dL — ABNORMAL HIGH (ref 70–99)

## 2018-03-21 LAB — BILIRUBIN, DIRECT: BILIRUBIN DIRECT: 1.2 mg/dL — AB (ref 0.0–0.2)

## 2018-03-21 LAB — MAGNESIUM: MAGNESIUM: 1.8 mg/dL (ref 1.7–2.4)

## 2018-03-21 MED ORDER — DIPHENHYDRAMINE HCL 25 MG PO CAPS
25.0000 mg | ORAL_CAPSULE | Freq: Four times a day (QID) | ORAL | Status: DC | PRN
  Administered 2018-03-21 – 2018-03-24 (×6): 25 mg via ORAL
  Filled 2018-03-21 (×6): qty 1

## 2018-03-21 MED ORDER — POTASSIUM CHLORIDE CRYS ER 20 MEQ PO TBCR: 40.0000 meq | EXTENDED_RELEASE_TABLET | Freq: Once | ORAL | Status: DC

## 2018-03-21 MED ORDER — PROPRANOLOL HCL ER 60 MG PO CP24
60.0000 mg | ORAL_CAPSULE | Freq: Three times a day (TID) | ORAL | Status: DC
  Administered 2018-03-21 – 2018-03-22 (×2): 60 mg via ORAL
  Filled 2018-03-21 (×3): qty 1

## 2018-03-21 MED ORDER — PROPRANOLOL HCL ER 60 MG PO CP24
60.0000 mg | ORAL_CAPSULE | Freq: Three times a day (TID) | ORAL | Status: DC
  Filled 2018-03-21: qty 1

## 2018-03-21 MED ORDER — ALBUMIN HUMAN 5 % IV SOLN
12.5000 g | Freq: Once | INTRAVENOUS | Status: AC
  Administered 2018-03-21: 12.5 g via INTRAVENOUS
  Filled 2018-03-21 (×2): qty 250

## 2018-03-21 MED ORDER — SALINE SPRAY 0.65 % NA SOLN
1.0000 | NASAL | Status: DC | PRN
  Filled 2018-03-21: qty 44

## 2018-03-21 MED ORDER — RANOLAZINE ER 500 MG PO TB12
500.0000 mg | ORAL_TABLET | Freq: Two times a day (BID) | ORAL | Status: DC
  Administered 2018-03-21: 500 mg via ORAL
  Filled 2018-03-21 (×2): qty 1

## 2018-03-21 MED ORDER — AMIODARONE HCL 200 MG PO TABS
300.0000 mg | ORAL_TABLET | Freq: Four times a day (QID) | ORAL | Status: DC
Start: 2018-03-21 — End: 2018-03-22
  Administered 2018-03-21 – 2018-03-22 (×3): 300 mg via ORAL
  Filled 2018-03-21 (×4): qty 2

## 2018-03-21 MED ORDER — POTASSIUM CHLORIDE 20 MEQ PO PACK
40.0000 meq | PACK | Freq: Once | ORAL | Status: AC
  Administered 2018-03-21: 40 meq via ORAL
  Filled 2018-03-21: qty 2

## 2018-03-21 NOTE — Consult Note (Signed)
Pharmacy Electrolyte Monitoring Consult:  Pharmacy consulted to assist in monitoring and replacing electrolytes in this 58 y.o. female admitted on 03/19/2018 with Shortness of Breath and Weakness   Labs:  Sodium (mmol/L)  Date Value  03/21/2018 139  03/14/2018 143   Potassium (mmol/L)  Date Value  03/21/2018 3.7   Magnesium (mg/dL)  Date Value  99/37/1696 1.8   Calcium (mg/dL)  Date Value  78/93/8101 8.6 (L)   Albumin (g/dL)  Date Value  75/10/2583 2.6 (L)    Assessment/Plan: Electrolyte Goals: Potassium ~4.0  Patient has furosemide 80 mg IV BID ordered.   Potassium 3.7 - will order KCl PO times one dose.  Will order electrolytes with AM labs.  Pharmacy will continue to monitor.  Clovia Cuff, PharmD, BCPS 03/21/2018 9:59 AM

## 2018-03-21 NOTE — Progress Notes (Signed)
PT Cancellation Note  Patient Details Name: Katherine [name] MRN: [id number] DOB: 08-19-1960   Cancelled Treatment:    Reason Eval/Treat Not Completed: Patient declined, no reason specified.  PT consult received.  Chart reviewed.  Pt refusing PT until she got area with Purewick cleaned up (NT notified).  Will re-attempt PT evaluation at a later date/time (nurse notified).  Hendricks Limes, PT 03/21/18, 10:39 AM [phone]

## 2018-03-21 NOTE — Progress Notes (Signed)
Progress Note  Patient Name: Katherine [name] Date of Encounter: 03/21/2018  Primary Cardiologist: Ladona Ridgel  Subjective   SOB improving. No chest pain. Documented UOP of 1.2 L for the admission. Weights do not make sense (documented weight of 122 kg on 2/29 with a documented weight of 63.3 kg on 3/2). Renal function and potassium stable.  Inpatient Medications    Scheduled Meds: . carvedilol  6.25 mg Oral BID WC  . chlorhexidine  15 mL Mouth Rinse BID  . clopidogrel  75 mg Oral Daily  . enoxaparin (LOVENOX) injection  40 mg Subcutaneous Q24H  . furosemide  80 mg Intravenous BID  . hydrALAZINE  20 mg Oral Q8H  . hydroxychloroquine  200 mg Oral BID  . insulin aspart  0-5 Units Subcutaneous QHS  . insulin aspart  0-9 Units Subcutaneous TID WC  . insulin aspart protamine- aspart  10 Units Subcutaneous BID WC  . isosorbide mononitrate  30 mg Oral Daily  . lisinopril  5 mg Oral Daily  . loratadine  10 mg Oral Daily  . mouth rinse  15 mL Mouth Rinse q12n4p  . mometasone-formoterol  2 puff Inhalation BID  . multivitamin with minerals  1 tablet Oral Daily  . potassium chloride  40 mEq Oral Once  . sodium chloride flush  3 mL Intravenous Once  . sodium chloride flush  3 mL Intravenous Q12H  . spironolactone  12.5 mg Oral Daily  . venlafaxine XR  150 mg Oral Daily   Continuous Infusions: . sodium chloride     PRN Meds: sodium chloride, acetaminophen, albuterol, diphenhydrAMINE, guaiFENesin, hydrocortisone cream, loratadine, ondansetron (ZOFRAN) IV, oxyCODONE-acetaminophen **AND** oxyCODONE, polyethylene glycol, sodium chloride, sodium chloride flush   Vital Signs    Vitals:   03/20/18 2024 03/21/18 0547 03/21/18 0548 03/21/18 0818  BP: 101/74  106/67 116/77  Pulse: 81  78 76  Resp: 20  18 16   Temp: (!) 97.5 F (36.4 C)  97.7 F (36.5 C) (!) 97.4 F (36.3 C)  TempSrc: Oral  Oral Oral  SpO2: 100%  100% 100%  Weight:  63.3 kg    Height:        Intake/Output Summary  (Last 24 hours) at 03/21/2018 1158 Last data filed at 03/21/2018 0548 Gross per 24 hour  Intake 240 ml  Output 850 ml  Net -610 ml   Filed Weights   03/19/18 0641 03/21/18 0547  Weight: 122.5 kg 63.3 kg    Telemetry    NSR with occasional PVCs, rare ventricular triplet, 4 beat run of NSVT - Personally Reviewed  ECG    n/a - Personally Reviewed  Physical Exam   GEN: No acute distress.   Neck: JVD difficult to assess secondary to body habitus. Cardiac: RRR, no murmurs, rubs, or gallops.  Respiratory: Diminished breath sounds bilaterally.  GI: Soft, nontender, non-distended.   MS: 2+ pitting lower extremity edema; No deformity. Neuro:  Alert and oriented x 3; Nonfocal.  Psych: Normal affect.  Labs    Chemistry Recent Labs  Lab 03/19/18 0744 03/20/18 0410 03/20/18 1951 03/21/18 0533  NA 141 141  --  139  K 2.3* 2.9* 3.8 3.7  CL 105 106  --  107  CO2 24 27  --  24  GLUCOSE 72 91  --  121*  BUN 23* 21*  --  24*  CREATININE 1.14* 1.11*  --  1.26*  CALCIUM 8.5* 8.2*  --  8.6*  PROT 7.4 6.3*  --  6.7  ALBUMIN 2.9* 2.5*  --  2.6*  AST 38 31  --  29  ALT 29 24  --  23  ALKPHOS 134* 112  --  119  BILITOT 3.1* 2.4*  --  2.3*  GFRNONAA 53* 55*  --  47*  GFRAA >60 >60  --  55*  ANIONGAP 12 8  --  8     Hematology Recent Labs  Lab 03/19/18 0656 03/20/18 0410 03/21/18 0533  WBC 5.2 4.5 4.7  RBC 4.38 3.90 3.89  HGB 13.4 11.9* 11.9*  HCT 41.4 37.6 37.3  MCV 94.5 96.4 95.9  MCH 30.6 30.5 30.6  MCHC 32.4 31.6 31.9  RDW 20.1* 20.2* 19.6*  PLT 251 232 218    Cardiac Enzymes Recent Labs  Lab 03/19/18 0744  TROPONINI <0.03   No results for input(s): TROPIPOC in the last 168 hours.   BNP Recent Labs  Lab 03/19/18 0702  BNP >4,500.0*     DDimer No results for input(s): DDIMER in the last 168 hours.   Radiology    US Abdomen Limited Ruq  Result Date: 03/19/2018 IMPRESSION: 1. Gallbladder filled with cholelithiasis with mild gallbladder wall thickening,  but without pericholecystic fluid or a sonographic Murphy sign. No sonographic evidence of acute cholecystitis. 2. Increased hepatic echogenicity as can be seen with hepatic steatosis. Electronically Signed   By: Elige Ko   On: 03/19/2018 12:55    Cardiac Studies   Echo 12/2017: Study Conclusions  - Left ventricle: The cavity size was moderately dilated. Wall   thickness was increased in a pattern of mild LVH. Systolic   function was severely reduced. The estimated ejection fraction   was in the range of 20% to 25%. Diffuse hypokinesis. Features are   consistent with a pseudonormal left ventricular filling pattern,   with concomitant abnormal relaxation and increased filling   pressure (grade 2 diastolic dysfunction). - Ventricular septum: Septal motion showed abnormal function and   dyssynergy. - Aortic valve: Mildly calcified annulus. Trileaflet; mildly   calcified leaflets. There was mild regurgitation. - Mitral valve: Mildly calcified annulus. There was mild   regurgitation. - Left atrium: The atrium was mildly dilated. - Right ventricle: The cavity size was mildly dilated. Pacer wire   or catheter noted in right ventricle. Systolic function was   mildly reduced. - Right atrium: The atrium was mildly dilated. - Tricuspid valve: There was mild regurgitation. - Pulmonary arteries: Systolic pressure was moderately increased.   PA peak pressure: 46 mm Hg (S). - Pericardium, extracardiac: A trivial pericardial effusion was   identified.  Patient Profile     58 y.o. female with history of HFrEF secondary to NICM with an EF of 20-25%, s/p ICD in 2013 with generator change out in 2018, NSVT s/p 2 shocks in 2019 on amiodarone, CKD stage III, IDDM, HTN, and HLD who was admitted with acute on chronic systolic CHF.  Assessment & Plan    1. Acute on chronic HFrEF secondary to NICM: -She remains volume up -IV Lasix -Coreg, lisinopril, and spironolactone (ACEi/spiro recently  restarted as they had been held for AKI) -Weights do not make sense (2/29 documented weight of 122 kg with a documented weight of 63.3 kg on 3/2) -Weigh the patient -Strict I/O  2. NSVT: -Status post ICD -Has had two confirmed appropriate shock therapies in 2019 and was placed on amiodarone in this setting with possible adverse effect of nausea -Amiodarone was discontinued by EP on 3/1, though it appears they may  not have had access to the patient's record -Defer amiodarone to EP -Coreg -Maintain potassium 4.0 and magnesium 2.0  3. CVA with residual left-sided weakness: -Plavix  4. HTN: -Well controlled currently, has been soft -Monitor   5. Hypoalbuminemia: -Likely contributing to her symptoms -Per IM  5. CKD stage III: -Renal function at her ~ baseline -Monitor    For questions or updates, please contact [facility] Please consult [email or web address] for contact info under Cardiology/STEMI.    Signed, Eula Listen, PA-C [facility] Pager: [phone] 03/21/2018, 11:58 AM

## 2018-03-21 NOTE — Progress Notes (Signed)
[employer] - [place] at [facility]   PATIENT NAME: Katherine [name]    MR#:  [id number]  DATE OF BIRTH:  Jul 04, 1960  SUBJECTIVE:  CHIEF COMPLAINT:   Chief Complaint  Patient presents with  . Shortness of Breath  . Weakness   Complains of nasal and sinus congestion this morning new since yesterday. Appetite good. No fever, chills, or body aches. Respiratory + flu negative yesterday.  No SOB at rest. Swelling in legs improved. + cough + itching, chronic  Intake/Output Summary (Last 24 hours) at 03/21/2018 1530 Last data filed at 03/21/2018 1515 Gross per 24 hour  Intake -  Output 950 ml  Net -950 ml   REVIEW OF SYSTEMS:  CONSTITUTIONAL:Negative for chills, diaphoresis,  myalgias andfever today.Positive forfatigue.  EYES:Occasional blurred vision. EARS, NOSE, AND THROAT: No tinnitus or ear pain.Positive forsore throat and sores in the mouth with lupus.  Positive for nasal and sinus congestion.  She has seasonal allergies. RESPIRATORY:Positive forcough, shortness of breath.No hemoptysis. No wheezing today.  No Sputum production. CARDIOVASCULAR: No chest pain,positive forlower extremity edema. Positive for orthopnea, PND. Negative for palpitations. GASTROINTESTINAL:Negative for nausea vomiting and constipation.  Negative for abdominal pain GENITOURINARY: No dysuria, hematuria.  ENDOCRINE: No polyuria, nocturia,  HEMATOLOGY: No anemia, easy bruising or bleeding SKIN:Positive for dry skin and itching MUSCULOSKELETAL:Positive forjoint painin feet ankles and legs.  NEUROLOGIC: No tingling, numbness, weakness.  PSYCHIATRY:Negative for depression DRUG ALLERGIES:   Allergies  Allergen Reactions  . Aspirin Other (See Comments)  . Penicillins Itching    Can take amoxicillin without a reaction  . Shellfish Allergy Other (See Comments)    Causes to have grand mal seizures   VITALS:  Blood pressure 116/77, pulse 76, temperature (!) 97.4 F (36.3  C), temperature source Oral, resp. rate 16, height 6' (1.829 m), weight 63.3 kg, last menstrual period 01/30/2011, SpO2 100 %. PHYSICAL EXAMINATION:  GENERAL:58 y.o.-year-old patient lying in the bed in no acute distress.  EYES: Pupils equal, round, reactive to light and accommodation. No scleral icterus. Extraocular muscles intact.  HEENT: Head atraumatic, normocephalic. Oropharynx and nasopharynx clear.  NECK: Supple, no jugular venous distention. No thyroid enlargement, no tenderness.  LUNGS:Decreasedbreath sounds bilaterally, no wheezing. Positiveralesat lung bases. No use of accessory muscles of respiration.  CARDIOVASCULAR: S1, S2 normal. No murmurs, rubs, or gallops. ABDOMEN: Soft, nontender, nondistended. Bowel sounds present. No organomegaly or mass.  EXTREMITIES:2+pitting edema. nocyanosis, or clubbing.  NEUROLOGIC: Cranial nerves II through XII are intact. Muscle strength 5/5 in all extremities. Sensation intact. Gait not checked.  PSYCHIATRIC: The patient is alert and oriented x 3.  SKIN: No rash, lesion, or ulcer. LABORATORY PANEL:  Female CBC Recent Labs  Lab 03/21/18 0533  WBC 4.7  HGB 11.9*  HCT 37.3  PLT 218   ------------------------------------------------------------------------------------------------------------------ Chemistries  Recent Labs  Lab 03/21/18 0533  NA 139  K 3.7  CL 107  CO2 24  GLUCOSE 121*  BUN 24*  CREATININE 1.26*  CALCIUM 8.6*  MG 1.8  AST 29  ALT 23  ALKPHOS 119  BILITOT 2.3*   RADIOLOGY:  No results found. ASSESSMENT AND PLAN:    MyrriellFreemanis a58 y.o.femalewith a known history of systolic congestive heart failure, systemic lupus erythematosus, nonischemic cardiomyopathy, hypertension, hyperlipidemia, CKD stage III, GERD, and stroke who presented with trouble breathing going on since Tuesday or Wednesday. She complained of her leg swelling. Weight going up and down. Has not been walking much in the past 6  months. Occasionally has  a cough with white-ish phlegm. Occasionally has some wheezing. She states that her right leg is been heavy since she had a stroke. Hospitalist were contacted for admission for congestive heart failure. Last echocardiogram 01/13/2018  showed an EF of 20 to 25%. Last admission for heart failure exacerbation 01/11/2018.  Has an ICD implanted probably 2012 with generator replacement 2018. Followed by Dr. Sharrell Ku.  Plan:  1. Acute on chronic systolic congestive heart failure.  Remains volume up.  Lasix 40 mg IV twice daily. Patient already on Coreg and lisinopril. aldactone was discontinued at [facility] previously due to renal insufficiency. cardiology resumed Aldactone today.  Monitor renal function. Cardiology/[facility] consulted, appreciate input.  CM consulted for heart failure clinic referral. Form signed. Daily weights and strict I&O  2. Hypokalemia. 3.7 today. Replace potassium orally 3 times today. Already ordered.Previous magnesium was low so IV magnesium given. Last Mg 1.8. Morning BMP & Mg to monitor. Aldactone as above.  3. SLE. Per patient cutaneous manifestations only.Continue Plaquenil  4. Congestion, seasonal allergies sent off flu swab and respiratory panel on admit: negative.  History of seasonal allergies.  Takes Zyrtec at home.  Continue loratadine. Nasal saline spray as needed.  Consider intranasal corticosteroid if no improvement tomorrow.  5. Type 2 diabetes mellitus on Humalog 50/50 twice daily. Will also put on sliding scale.  6. Depression on Effexor. Monitor.  7. COPD. Continue inhalers. She explains that she was using her maintenance inhaler as a rescue inhaler at home rather than as scheduled with albuterol for rescue. Counseled and will continue to educate and counsel, and at discharge.   8. Obesity with a BMI of 36.62. Weight loss needed.   9. History of stroke on Plavix  10. Headache, resolved received IV  magnesium   11.Weakness physical therapy evaluation, will see when she is more medically-stable per notes.  Was not seen today as patient refused.  Will continue to encourage.  12. Elevated bilirubin with nausea vomiting - right upper quadrant ultrasound ordered by admitting MD General Surgery/Dr. Everlene Farrier consulted: impression is of cholelithiasis with possible mild biliary colic, bilirubin increase likely due to cholestasis from heart failure. No HIDA scan indicated. No surgical intervention immediately indicated. Outpatient follow-up.  13. History Nonsustained VT: amiodarone discontinued by cardiology as it was poorly tolerated in the past. 7 beats VTach this AM. Potassium given early. Continue telemetry.  14. Probable sleep apnea: will follow with cardiology, consider outpatient sleep study at D/C  15. Anemia on plaquenil. Monitor CBC.   16.  Hypoalbuminemia ordered 1 dose of albumin 5% today. Monitor.   DVT Prophylaxis: subQ Lovenox    All the records are reviewed and case is discussed with Care Management/Social Worker. Management plans discussed with the patient and/or family and they are in agreement.  CODE STATUS: Full Code  TOTAL TIME TAKING CARE OF THIS PATIENT: 30 minutes.   More than 50% of the time was spent in counseling/coordination of care: YES  POSSIBLE D/C IN 1-2 DAYS, DEPENDING ON CLINICAL CONDITION.   Sylvan Lahm PA-C on 03/21/2018 at 3:30 PM  Between 7am to 6pm - Pager - [phone]  After 6 pm go to [email or web address] - Social research officer, government  [facility] [place] [facility]  Office  [phone]  CC: Primary care physician; Alvina Filbert, MD  Note: This dictation was prepared with Dragon dictation along with smaller phrase technology. Any transcriptional errors that result from this process are unintentional.

## 2018-03-21 NOTE — Progress Notes (Signed)
Initial Nutrition Assessment  DOCUMENTATION CODES:   Morbid obesity  INTERVENTION:   - RD obtained bed weight at time of visit: 141.5 kg  - Provided low sodium, consistent carbohydrate diet education and handout  - Continue MVI with minerals daily  NUTRITION DIAGNOSIS:   Increased nutrient needs related to chronic illness (CHF, CKD stage III) as evidenced by estimated needs.  GOAL:   Patient will meet greater than or equal to 90% of their needs  MONITOR:   PO intake, I & O's, Labs, Weight trends  REASON FOR ASSESSMENT:   Malnutrition Screening Tool, Consult Diet education  ASSESSMENT:   58 year old female who presented to the ED on 2/29 with SOB and weakness. PMH significant for CHF, CKD stage III, GERD, HLD, HTN, T2DM. Pt admitted with CHF exacerbation.  RD consulted for nutrition education regarding CHF. Pt also screened for MST.  Spoke with pt at bedside. Pt shares that she has seen a dietitian in the past in [place] who has helped her with nutrition for diabetes and CHF. Pt shares that she was told to limit sodium intake to 2500 mg daily and limit fluid to 1.5 pints daily. Pt shares that she tries to weigh herself daily but that "it is a mental battle with the scale."  Pt reports typically eating 2 meals daily. Lunch may include noodles and soup. Dinner may include a [employer] [employer] sandwich on whole wheat bread with lettuce, tomato, and avocado.  Pt reports that she avoids beef even though she enjoys it. Pt shares that she recently found out that sea salt has sodium in it and has cut that out of her diet. RD discussed alternative flavoring ideas with pt.  RD provided "Low Sodium Nutrition Therapy" handout from the Academy of Nutrition and Dietetics. Reviewed patient's dietary recall. Provided examples on ways to decrease sodium intake in diet. Discouraged intake of processed foods and use of salt shaker. Encouraged fresh fruits and vegetables as well as whole grain  sources of carbohydrates to maximize fiber intake.   RD discussed why it is important for patient to adhere to diet recommendations, and emphasized the role of fluids, foods to avoid, and importance of weighing self daily. Teach back method used.  Expect fair compliance.  Reviewed weight history in chart. Noted pt with 21 kg weight gain in 2 months. Suspect weight fluctuations are related to fluid status given edema and CHF diagnosis. Pt currently on Lasix.  Meal Completion: 85-100% x 2 meals  Medications reviewed and include: Lasix 80 mg BID, SSI, Novolog 70/30 10 units BID, MVI with minerals, Klor-con 40 mEq once, spironolactone  Labs reviewed: BUN 24 (H), creatinine 1.26 (H) CBG's: 118, 82, 114, 123 x 24 hours  UOP: 850 ml x 24 hours I/O's: -1.2 L since admit  NUTRITION - FOCUSED PHYSICAL EXAM:    Most Recent Value  Orbital Region  No depletion  Upper Arm Region  No depletion  Thoracic and Lumbar Region  No depletion  Buccal Region  No depletion  Temple Region  No depletion  Clavicle Bone Region  No depletion  Clavicle and Acromion Bone Region  No depletion  Scapular Bone Region  No depletion  Dorsal Hand  No depletion  Patellar Region  No depletion  Anterior Thigh Region  No depletion  Posterior Calf Region  No depletion  Edema (RD Assessment)  Mild [BLE]  Hair  Reviewed  Eyes  Reviewed  Mouth  Reviewed  Skin  Reviewed  Nails  Reviewed  Diet Order:   Diet Order            Diet heart healthy/carb modified Room service appropriate? Yes; Fluid consistency: Thin  Diet effective now              EDUCATION NEEDS:   Education needs have been addressed  Skin:  Skin Assessment: Reviewed RN Assessment  Last BM:  2/29  Height:   Ht Readings from Last 1 Encounters:  03/19/18 6' (1.829 m)    Weight:   Wt Readings from Last 1 Encounters:  03/21/18 (!) 141.5 kg    Ideal Body Weight:  72.7 kg   BMI:  Body mass index is 42.31 kg/m.  Estimated  Nutritional Needs:   Kcal:  2200-2400  Protein:  110-125 grams  Fluid:  per MD    Earma Reading, MS, RD, LDN Inpatient Clinical Dietitian Pager: [phone] Weekend/After Hours: [phone]

## 2018-03-22 ENCOUNTER — Encounter: Payer: Medicaid Other | Admitting: Internal Medicine

## 2018-03-22 DIAGNOSIS — I472 Ventricular tachycardia: Secondary | ICD-10-CM

## 2018-03-22 DIAGNOSIS — Z95810 Presence of automatic (implantable) cardiac defibrillator: Secondary | ICD-10-CM

## 2018-03-22 LAB — COMPREHENSIVE METABOLIC PANEL
ALT: 24 U/L (ref 0–44)
AST: 28 U/L (ref 15–41)
Albumin: 2.8 g/dL — ABNORMAL LOW (ref 3.5–5.0)
Alkaline Phosphatase: 116 U/L (ref 38–126)
Anion gap: 8 (ref 5–15)
BILIRUBIN TOTAL: 2.1 mg/dL — AB (ref 0.3–1.2)
BUN: 24 mg/dL — AB (ref 6–20)
CO2: 26 mmol/L (ref 22–32)
Calcium: 8.3 mg/dL — ABNORMAL LOW (ref 8.9–10.3)
Chloride: 103 mmol/L (ref 98–111)
Creatinine, Ser: 1.46 mg/dL — ABNORMAL HIGH (ref 0.44–1.00)
GFR calc Af Amer: 46 mL/min — ABNORMAL LOW (ref >60)
GFR calc non Af Amer: 40 mL/min — ABNORMAL LOW (ref >60)
Glucose, Bld: 110 mg/dL — ABNORMAL HIGH (ref 70–99)
Potassium: 4.1 mmol/L (ref 3.5–5.1)
Sodium: 137 mmol/L (ref 135–145)
Total Protein: 6.6 g/dL (ref 6.5–8.1)

## 2018-03-22 LAB — GLUCOSE, CAPILLARY
Glucose-Capillary: 106 mg/dL — ABNORMAL HIGH (ref 70–99)
Glucose-Capillary: 110 mg/dL — ABNORMAL HIGH (ref 70–99)
Glucose-Capillary: 84 mg/dL (ref 70–99)
Glucose-Capillary: 81 mg/dL (ref 70–99)

## 2018-03-22 LAB — CBC
HCT: 38.2 % (ref 36.0–46.0)
Hemoglobin: 11.8 g/dL — ABNORMAL LOW (ref 12.0–15.0)
MCH: 30.2 pg (ref 26.0–34.0)
MCHC: 30.9 g/dL (ref 30.0–36.0)
MCV: 97.7 fL (ref 80.0–100.0)
Platelets: 241 10*3/uL (ref 150–400)
RBC: 3.91 MIL/uL (ref 3.87–5.11)
RDW: 19.9 % — ABNORMAL HIGH (ref 11.5–15.5)
WBC: 4.7 10*3/uL (ref 4.0–10.5)
nRBC: 1.9 % — ABNORMAL HIGH (ref 0.0–0.2)

## 2018-03-22 LAB — MAGNESIUM: Magnesium: 1.9 mg/dL (ref 1.7–2.4)

## 2018-03-22 MED ORDER — AMIODARONE HCL 200 MG PO TABS
200.0000 mg | ORAL_TABLET | Freq: Four times a day (QID) | ORAL | Status: DC
  Administered 2018-03-23 – 2018-03-24 (×5): 200 mg via ORAL
  Filled 2018-03-22 (×5): qty 1

## 2018-03-22 MED ORDER — ALUM & MAG HYDROXIDE-SIMETH 200-200-20 MG/5ML PO SUSP
30.0000 mL | Freq: Four times a day (QID) | ORAL | Status: DC | PRN
  Administered 2018-03-22: 30 mL via ORAL
  Filled 2018-03-22: qty 30

## 2018-03-22 MED ORDER — AMIODARONE HCL 200 MG PO TABS
200.0000 mg | ORAL_TABLET | Freq: Once | ORAL | Status: AC
  Administered 2018-03-22: 200 mg via ORAL

## 2018-03-22 MED ORDER — PROMETHAZINE HCL 25 MG/ML IJ SOLN: 25.0000 mg | Freq: Four times a day (QID) | INTRAMUSCULAR | Status: DC | PRN

## 2018-03-22 MED ORDER — ALBUMIN HUMAN 5 % IV SOLN
12.5000 g | Freq: Once | INTRAVENOUS | Status: AC
  Administered 2018-03-22: 12.5 g via INTRAVENOUS
  Filled 2018-03-22: qty 250

## 2018-03-22 MED ORDER — RANOLAZINE ER 500 MG PO TB12
500.0000 mg | ORAL_TABLET | Freq: Two times a day (BID) | ORAL | Status: DC
  Administered 2018-03-22 – 2018-03-24 (×5): 500 mg via ORAL
  Filled 2018-03-22 (×6): qty 1

## 2018-03-22 MED ORDER — PROPRANOLOL HCL 40 MG PO TABS
60.0000 mg | ORAL_TABLET | Freq: Four times a day (QID) | ORAL | Status: DC
  Filled 2018-03-22 (×4): qty 2

## 2018-03-22 MED ORDER — DIMENHYDRINATE 50 MG PO TABS
25.0000 mg | ORAL_TABLET | Freq: Three times a day (TID) | ORAL | Status: DC | PRN
  Administered 2018-03-22: 25 mg via ORAL
  Filled 2018-03-22 (×3): qty 1

## 2018-03-22 NOTE — Progress Notes (Signed)
Progress Note  Patient Name: Katherine [name] Date of Encounter: 03/22/2018  Primary Cardiologist: Ladona Ridgel  Subjective    Shortness of breath is better.  Discussed the food in her room and the importance of diet. The room is malodorous for reasons I cannot tell.  Inpatient Medications    Scheduled Meds: . amiodarone  300 mg Oral Q6H  . chlorhexidine  15 mL Mouth Rinse BID  . clopidogrel  75 mg Oral Daily  . enoxaparin (LOVENOX) injection  40 mg Subcutaneous Q24H  . furosemide  80 mg Intravenous BID  . hydrALAZINE  20 mg Oral Q8H  . hydroxychloroquine  200 mg Oral BID  . insulin aspart  0-5 Units Subcutaneous QHS  . insulin aspart  0-9 Units Subcutaneous TID WC  . insulin aspart protamine- aspart  10 Units Subcutaneous BID WC  . isosorbide mononitrate  30 mg Oral Daily  . lisinopril  5 mg Oral Daily  . loratadine  10 mg Oral Daily  . mouth rinse  15 mL Mouth Rinse q12n4p  . mometasone-formoterol  2 puff Inhalation BID  . multivitamin with minerals  1 tablet Oral Daily  . propranolol ER  60 mg Oral Q8H  . ranolazine  500 mg Oral BID  . sodium chloride flush  3 mL Intravenous Once  . sodium chloride flush  3 mL Intravenous Q12H  . spironolactone  12.5 mg Oral Daily  . venlafaxine XR  150 mg Oral Daily   Continuous Infusions: . sodium chloride     PRN Meds: sodium chloride, acetaminophen, albuterol, diphenhydrAMINE, guaiFENesin, hydrocortisone cream, loratadine, ondansetron (ZOFRAN) IV, oxyCODONE-acetaminophen **AND** oxyCODONE, polyethylene glycol, sodium chloride, sodium chloride flush   Vital Signs    Vitals:   03/22/18 0439 03/22/18 0600 03/22/18 0734 03/22/18 0739  BP:  91/60 90/73 (!) 87/64  Pulse:  74 60 60  Resp:   19   Temp:    98.1 F (36.7 C)  TempSrc:    Oral  SpO2:   100%   Weight: (!) 141.1 kg     Height:        Intake/Output Summary (Last 24 hours) at 03/22/2018 0849 Last data filed at 03/22/2018 0300 Gross per 24 hour  Intake 620 ml  Output  1050 ml  Net -430 ml   Filed Weights   03/21/18 0547 03/21/18 1541 03/22/18 0439  Weight: 63.3 kg (!) 141.5 kg (!) 141.1 kg    Telemetry    Telemetry Personally reviewed  Without VT   ECG    n/a - Personally Reviewed  Physical Exam  Well developed and nourished in no acute distress HENT normal Neck supple with JVP-flat Clear Regular rate and rhythm, no murmurs or gallops Abd-soft with active BS No Clubbing cyanosis 2+ edema Skin-warm and dry A & Oriented  Grossly normal sensory and motor function   Labs    Chemistry Recent Labs  Lab 03/20/18 0410 03/20/18 1951 03/21/18 0533 03/22/18 0453  NA 141  --  139 137  K 2.9* 3.8 3.7 4.1  CL 106  --  107 103  CO2 27  --  24 26  GLUCOSE 91  --  121* 110*  BUN 21*  --  24* 24*  CREATININE 1.11*  --  1.26* 1.46*  CALCIUM 8.2*  --  8.6* 8.3*  PROT 6.3*  --  6.7 6.6  ALBUMIN 2.5*  --  2.6* 2.8*  AST 31  --  29 28  ALT 24  --  23  24  ALKPHOS 112  --  119 116  BILITOT 2.4*  --  2.3* 2.1*  GFRNONAA 55*  --  47* 40*  GFRAA >60  --  55* 46*  ANIONGAP 8  --  8 8     Hematology Recent Labs  Lab 03/20/18 0410 03/21/18 0533 03/22/18 0453  WBC 4.5 4.7 4.7  RBC 3.90 3.89 3.91  HGB 11.9* 11.9* 11.8*  HCT 37.6 37.3 38.2  MCV 96.4 95.9 97.7  MCH 30.5 30.6 30.2  MCHC 31.6 31.9 30.9  RDW 20.2* 19.6* 19.9*  PLT 232 218 241    Cardiac Enzymes Recent Labs  Lab 03/19/18 0744  TROPONINI <0.03   No results for input(s): TROPIPOC in the last 168 hours.   BNP Recent Labs  Lab 03/19/18 0702  BNP >4,500.0*     DDimer No results for input(s): DDIMER in the last 168 hours.   Radiology    US Abdomen Limited Ruq  Result Date: 03/19/2018 IMPRESSION: 1. Gallbladder filled with cholelithiasis with mild gallbladder wall thickening, but without pericholecystic fluid or a sonographic Murphy sign. No sonographic evidence of acute cholecystitis. 2. Increased hepatic echogenicity as can be seen with hepatic steatosis.  Electronically Signed   By: Elige Ko   On: 03/19/2018 12:55    Cardiac Studies   Echo 12/2017: Study Conclusions  - Left ventricle: The cavity size was moderately dilated. Wall   thickness was increased in a pattern of mild LVH. Systolic   function was severely reduced. The estimated ejection fraction   was in the range of 20% to 25%. Diffuse hypokinesis. Features are   consistent with a pseudonormal left ventricular filling pattern,   with concomitant abnormal relaxation and increased filling   pressure (grade 2 diastolic dysfunction). - Ventricular septum: Septal motion showed abnormal function and   dyssynergy. - Aortic valve: Mildly calcified annulus. Trileaflet; mildly   calcified leaflets. There was mild regurgitation. - Mitral valve: Mildly calcified annulus. There was mild   regurgitation. - Left atrium: The atrium was mildly dilated. - Right ventricle: The cavity size was mildly dilated. Pacer wire   or catheter noted in right ventricle. Systolic function was   mildly reduced. - Right atrium: The atrium was mildly dilated. - Tricuspid valve: There was mild regurgitation. - Pulmonary arteries: Systolic pressure was moderately increased.   PA peak pressure: 46 mm Hg (S). - Pericardium, extracardiac: A trivial pericardial effusion was   identified.  Patient Profile     58 y.o. female with history of HFrEF secondary to NICM with an EF of 20-25%, s/p ICD in 2013 with generator change out in 2018, NSVT s/p 2 shocks in 2019 on amiodarone, CKD stage III, IDDM, HTN, and HLD who was admitted with acute on chronic systolic CHF.  Assessment & Plan    1. Acute on chronic HFrEF secondary to NICM: -  2. NSVT:VT/ VT polymorphic/VF  3. CVA with residual left-sided weakness:   4. HTN:   5. CKD stage III:    With the help of the device clinic, I was able to find her old records.  She has had multiple ICD discharges in the last 2 months associated with syncope and then VT  VF on her device prompting appropriate therapy.  Efforts to initiate amiodarone previously were discontinued after 2-3 days.  I resumed it again last night trying to get more frequent doses but somewhat lower doses.  She is currently on 300 every 6.  If she develops nausea, I  would decrease it to 200 mg q. 6  I also switched her carvedilol to propranolol; propranolol has late acting sodium channel blockade.   Similar to the effects of ranolazine.  For now we will use the former in conjunction with the amiodarone.  Ongoing diuresis  Dietary consult yesterday.  No interval atrial fibrillation  Sherryl Manges

## 2018-03-22 NOTE — Progress Notes (Addendum)
Patient's blood pressure low this morning (see flowsheet) . Dr. Sherryll Burger notified because patient had blood pressure medications and lasix scheduled this morning. Per MD hold imdur, lisinopril, propranolol and lasix. Medications held. Patient also complaining of nausea and pain on leg. PRN medications given. Will continue to monitor patient.   Update 1752: patients blood pressure 91/65. Patient feeling very dizzy while laying in the bed, still nauseas. MD notified. Orders to hold the scheduled lasix and amiodarone. New orders will be placed by MD. Will continue to monitor patient.

## 2018-03-22 NOTE — Progress Notes (Addendum)
Remote ICD transmission.   

## 2018-03-22 NOTE — Progress Notes (Signed)
[facility] - [place] at [facility]   PATIENT NAME: Katherine [name]    MR#:  [id number]  DATE OF BIRTH:  [birth year]  SUBJECTIVE:  CHIEF COMPLAINT:   Chief Complaint  Patient presents with  . Shortness of Breath  . Weakness   Up in chair resting, had some nausea this AM which resolved but then recurred in the afternoon.  Amiodarone decreased to 200 mg every 6 from 300 mg per Dr. Graciela Husbands.   no vomiting. + leg swelling + R abdominal pain intermittent as before, some R abdominal swelling. Declined working with PT this morning. BP low, meds held.  REVIEW OF SYSTEMS:  CONSTITUTIONAL:Negativefor chills,diaphoresis, myalgias andfevertoday.Positive forfatigue.  EYES:Occasional blurred vision. EARS, NOSE, AND THROAT: No tinnitus or ear pain.Positive forsore throat and sores in the mouth with lupus.  Positive for nasal and sinus congestion.  She has seasonal allergies. RESPIRATORY:Positive forcough, shortness of breath.No hemoptysis.No wheezing today. No Sputum production. CARDIOVASCULAR: No chest pain,positive forlower extremityedema.Positive for orthopnea, PND. Negative for palpitations. GASTROINTESTINAL:positive for nausea Negative for vomiting and constipation.  Negative for abdominal pain  GENITOURINARY: No dysuria, hematuria.  ENDOCRINE: No polyuria, nocturia,  HEMATOLOGY: No anemia, easy bruising or bleeding SKIN:Positive for dry skin and itching MUSCULOSKELETAL:Positive forjoint painin feet ankles and legs.  NEUROLOGIC: No tingling, numbness, weakness.  PSYCHIATRY:Negative for depression DRUG ALLERGIES:   Allergies  Allergen Reactions  . Aspirin Other (See Comments)  . Penicillins Itching    Can take amoxicillin without a reaction  . Shellfish Allergy Other (See Comments)    Causes to have grand mal seizures   VITALS:  Blood pressure 90/60, pulse 60, temperature 98.1 F (36.7 C), temperature source Oral, resp. rate 19, height  6' (1.829 m), weight (!) 141.1 kg, last menstrual period 01/30/2011, SpO2 100 %. PHYSICAL EXAMINATION:  GENERAL:58 y.o.-year-old patient lying in the bedinno acute distress.  EYES: Pupils equal, round, reactive to light and accommodation. No scleral icterus. Extraocular muscles intact.  HEENT: Head atraumatic, normocephalic. Oropharynx and nasopharynx clear.  NECK: Supple, no jugular venous distention. No thyroid enlargement, no tenderness.  LUNGS:Decreasedbreath sounds bilaterally, no wheezing. Positiveralesat lung bases. No use of accessory muscles of respiration.  CARDIOVASCULAR: S1, S2 normal. No murmurs, rubs, or gallops. ABDOMEN: Soft, nontender, nondistended. Bowel sounds present. No organomegaly or mass.  EXTREMITIES:2+pittingedema.nocyanosis, or clubbing.  NEUROLOGIC: Cranial nerves II through XII are intact. Muscle strength 5/5 in all extremities. Sensation intact. Gait not checked.  PSYCHIATRIC: The patient is alert and oriented x 3.  SKIN: No rash, lesion, or ulcer. LABORATORY PANEL:  Female CBC Recent Labs  Lab 03/22/18 0453  WBC 4.7  HGB 11.8*  HCT 38.2  PLT 241   ------------------------------------------------------------------------------------------------------------------ Chemistries  Recent Labs  Lab 03/22/18 0453  NA 137  K 4.1  CL 103  CO2 26  GLUCOSE 110*  BUN 24*  CREATININE 1.46*  CALCIUM 8.3*  MG 1.9  AST 28  ALT 24  ALKPHOS 116  BILITOT 2.1*   RADIOLOGY:  No results found. ASSESSMENT AND PLAN:  MyrriellFreemanis a57 y.o.femalewith a known history of systolic congestive heart failure, systemic lupus erythematosus, nonischemic cardiomyopathy, hypertension, hyperlipidemia, CKD stage III, GERD, and stroke whopresentedwith trouble breathing going on since Tuesday or Wednesday. She complainedof her leg swelling. Weight goingup and down. Has not been walking much in the past 6 months.Occasionally has a cough with  white-ishphlegm. Occasionally has some wheezing. She states that her right leg is been heavy since she had a stroke. Hospitalist were contacted for  admission for congestive heart failure. Last echocardiogram12/26/2019showed an EF of 20 to 25%. Last admission for heart failure exacerbation 01/11/2018.  Has an ICD implanted probably 2012 with generator replacement 2018. Followed by Dr.Greg Ladona Ridgel.  Plan:  1. Acute on chronic systolic congestive heart failure. Remains volume up.  Lasix increased to 80 mg IV twice daily. Patient was on Coreg and lisinopril.Aldactone was discontinued at [facility] previously due to renal insufficiency. Cardiology resumed Aldactone yesterday. Coreg switched to propanolol.   Monitor renal function. Cardiology/[facility] consulted, appreciate input. Counseled on diet today.  CM consulted for heart failure clinic referral. Form signed. Daily weights and strict I&O  2. Hypokalemia.4.1 today.Previous magnesium was low so IV magnesiumgiven.Last Mg 1.9. Morning BMP & Mg to monitor. Aldactone as above.  3.SLE.Per patient cutaneous manifestations only.Continue Plaquenil  4. Congestion, seasonal allergies sentoff flu swab and respiratory panelon admit: negative.  History of seasonal allergies.  Takes Zyrtec at home.  Continue loratadine. Nasal saline spray as needed.  Consider intranasal corticosteroid if needed.   5. Type 2 diabetes mellituson Humalog 50/50 twice daily. Will also put on sliding scale.  6. Depression on Effexor. Monitor.  7. COPD. Continue inhalers.She explains that she was using her maintenance inhaler as a rescue inhaler at home rather than as scheduled with albuterol for rescue. Counseled and will continue to educate and counsel, and at discharge.  8. Obesitywith a BMI of 36.62. Weight loss needed.Counseling provided.   9. History of strokeon Plavix  10. Headache, resolved receivedIV  magnesium  11.Weaknessphysical therapy evaluation, will see when she is more medically-stable per notes.  Was not seen today as patient refused.  Will continue to encourage.  12. Elevated bilirubin with nausea vomiting-right upper quadrant ultrasoundordered by admitting MD General Surgery/Dr. Everlene Farrier consulted: impression is of cholelithiasis with possible mild biliary colic, bilirubin increase likely due to cholestasis from heart failure. No HIDA scan indicated. No surgical intervention immediately indicated. Outpatient follow-up.  13. History Nonsustained VT: amiodarone discontinued by cardiology as it was poorly tolerated in the past. 7 beats VTach yesterday AM. Potassium given early. Continue telemetry. Amiodarone 300 mg q 6 initiated by Dr. Graciela Husbands. Decreased to 200 mg Q 6 per recommendations in note due to nausea/poor tolerance.   14. Probable sleep apnea:will follow with cardiology, consider outpatient sleep study at D/C  15.  Anemia on plaquenil. Monitor CBC. Hgb 11.8 today.  16.  Hypoalbuminemia up to 2.8. ordered additional second dose of albumin 5% today. Monitor.   DVT Prophylaxis: subQ Lovenox   All the records are reviewed and case is discussed with Care Management/Social Worker. Management plans discussed with the patient and/or family and they are in agreement.  CODE STATUS: Full Code  TOTAL TIME TAKING CARE OF THIS PATIENT: 30 minutes.   More than 50% of the time was spent in counseling/coordination of care: YES  POSSIBLE D/C IN 2-3 DAYS, DEPENDING ON CLINICAL CONDITION.   Tarez Bowns PA-C on 03/22/2018 at 11:06 AM  Between 7am to 6pm - Pager - [phone]  After 6 pm go to [email or web address] - Social research officer, government  [facility] [place] [facility]  Office  [phone]  CC: Primary care physician; Alvina Filbert, MD  Note: This dictation was prepared with Dragon dictation along with smaller phrase technology. Any transcriptional errors that  result from this process are unintentional.

## 2018-03-22 NOTE — Progress Notes (Signed)
Spoke to Dr. Sherryll Burger about patient. PRN medication for dizziness given. Patient currently asleep. Verbal orders by Dr. Sherryll Burger for PRN phenergan 25mg  Q6 and to hold all BP medications tonight if systolic blood pressure less than 90.

## 2018-03-22 NOTE — Consult Note (Signed)
Pharmacy Electrolyte Monitoring Consult:  Pharmacy consulted to assist in monitoring and replacing electrolytes in this 58 y.o. female admitted on 03/19/2018 with Shortness of Breath and Weakness   Labs:  Sodium (mmol/L)  Date Value  03/22/2018 137  03/14/2018 143   Potassium (mmol/L)  Date Value  03/22/2018 4.1   Magnesium (mg/dL)  Date Value  75/43/6067 1.9   Calcium (mg/dL)  Date Value  70/34/0352 8.3 (L)   Albumin (g/dL)  Date Value  48/18/5909 2.8 (L)    Assessment/Plan: Electrolyte Goals: Potassium ~4.0  Patient has furosemide 80 mg IV BID ordered.   Potassium 4.1 - No replenishment warranted at this time  Will order electrolytes with AM labs.  Pharmacy will continue to monitor.  Albina Billet, PharmD, BCPS Clinical Pharmacist 03/22/2018 7:36 AM

## 2018-03-22 NOTE — Progress Notes (Signed)
PT Cancellation Note  Patient Details Name: Katherine [name] MRN: [id number] DOB: [birth year]   Cancelled Treatment:    Reason Eval/Treat Not Completed: Other (comment).  Pt sitting up in chair upon PT arrival and pt currently declining PT d/t feeling nauseas (pt reports recent nausea meds).  Will re-attempt PT evaluation at a later date/time.  Hendricks Limes, PT 03/22/18, 11:13 AM [phone]

## 2018-03-23 LAB — COMPREHENSIVE METABOLIC PANEL
ALBUMIN: 3.1 g/dL — AB (ref 3.5–5.0)
ALT: 24 U/L (ref 0–44)
AST: 35 U/L (ref 15–41)
Alkaline Phosphatase: 127 U/L — ABNORMAL HIGH (ref 38–126)
Anion gap: 11 (ref 5–15)
BUN: 31 mg/dL — AB (ref 6–20)
CO2: 22 mmol/L (ref 22–32)
Calcium: 9.1 mg/dL (ref 8.9–10.3)
Chloride: 106 mmol/L (ref 98–111)
Creatinine, Ser: 2.17 mg/dL — ABNORMAL HIGH (ref 0.44–1.00)
GFR calc Af Amer: 28 mL/min — ABNORMAL LOW (ref >60)
GFR calc non Af Amer: 24 mL/min — ABNORMAL LOW (ref >60)
GLUCOSE: 73 mg/dL (ref 70–99)
Potassium: 5 mmol/L (ref 3.5–5.1)
Sodium: 139 mmol/L (ref 135–145)
Total Bilirubin: 2.9 mg/dL — ABNORMAL HIGH (ref 0.3–1.2)
Total Protein: 6.9 g/dL (ref 6.5–8.1)

## 2018-03-23 LAB — CBC
HCT: 37.6 % (ref 36.0–46.0)
Hemoglobin: 12.2 g/dL (ref 12.0–15.0)
MCH: 30.4 pg (ref 26.0–34.0)
MCHC: 32.4 g/dL (ref 30.0–36.0)
MCV: 93.8 fL (ref 80.0–100.0)
Platelets: 266 10*3/uL (ref 150–400)
RBC: 4.01 MIL/uL (ref 3.87–5.11)
RDW: 19.6 % — AB (ref 11.5–15.5)
WBC: 6.1 10*3/uL (ref 4.0–10.5)
nRBC: 1.8 % — ABNORMAL HIGH (ref 0.0–0.2)

## 2018-03-23 LAB — GLUCOSE, CAPILLARY
Glucose-Capillary: 55 mg/dL — ABNORMAL LOW (ref 70–99)
Glucose-Capillary: 87 mg/dL (ref 70–99)
Glucose-Capillary: 106 mg/dL — ABNORMAL HIGH (ref 70–99)
Glucose-Capillary: 109 mg/dL — ABNORMAL HIGH (ref 70–99)

## 2018-03-23 MED ORDER — ALBUMIN HUMAN 5 % IV SOLN
12.5000 g | Freq: Once | INTRAVENOUS | Status: AC
  Administered 2018-03-23: 12.5 g via INTRAVENOUS
  Filled 2018-03-23: qty 250

## 2018-03-23 MED ORDER — GLUCERNA SHAKE PO LIQD
237.0000 mL | Freq: Three times a day (TID) | ORAL | Status: DC
  Administered 2018-03-23 – 2018-03-24 (×5): 237 mL via ORAL

## 2018-03-23 NOTE — Progress Notes (Signed)
[facility] - [place] at [facility]   PATIENT NAME: Katherine [name]    MR#:  [id number]  DATE OF BIRTH:  Feb 16, 1960  SUBJECTIVE:  CHIEF COMPLAINT:   Chief Complaint  Patient presents with  . Shortness of Breath  . Weakness   Lying in bed complaining of dizziness and nausea though this is improved from last night.  Appetite poor, has been drinking juice to keep from hypoglycemia. Requests Glucerna shakes for while she is not eating solid food.   + abdominal fullness and + lower extremity swelling - SOB - orthopnea   REVIEW OF SYSTEMS:  ROS CONSTITUTIONAL:Negativefor chills,diaphoresis,myalgiasandfevertoday.Positive forfatigue.  EYES:Occasional blurred vision. EARS, NOSE, AND THROAT: No tinnitus or ear pain.Positive forsore throat and soresin the mouth with lupus. Positive for nasal and sinus congestion. She has seasonal allergies. RESPIRATORY:Positive forcough, shortness of breath.No hemoptysis.No wheezing today.NoSputum production. CARDIOVASCULAR: No chest pain,positive forlower extremityedema.Positive for orthopnea, PND. Negative for palpitations. GASTROINTESTINAL:positive for nausea and abdominal fullness. Negative for vomiting and constipation.Negative for abdominal pain.  GENITOURINARY: No dysuria, hematuria.  ENDOCRINE: No polyuria, nocturia,  HEMATOLOGY: No anemia, easy bruising or bleeding SKIN:Positive for dry skin and itching MUSCULOSKELETAL:Positive forjoint painin feet ankles and legs.  NEUROLOGIC: Positive for dizziness. No tingling, numbness, weakness.  PSYCHIATRY:Negative for depression DRUG ALLERGIES:   Allergies  Allergen Reactions  . Aspirin Other (See Comments)  . Penicillins Itching    Can take amoxicillin without a reaction  . Shellfish Allergy Other (See Comments)    Causes to have grand mal seizures   VITALS:  Blood pressure 94/62, pulse 65, temperature 97.8 F (36.6 C), temperature source  Oral, resp. rate 19, height 6' (1.829 m), weight (!) 142.2 kg, last menstrual period 01/30/2011, SpO2 100 %. PHYSICAL EXAMINATION:  Physical Exam  GENERAL:58 y.o.-year-old patient lying in the bedinno acute distress though she appears uncomfortable EYES: Pupils equal, round, reactive to light and accommodation. No scleral icterus. Extraocular muscles intact.  HEENT: Head atraumatic, normocephalic. Oropharynx and nasopharynx clear.  NECK: Supple, no jugular venous distention. No thyroid enlargement, no tenderness.  LUNGS:Decreasedbreath sounds throughout. No use of accessory muscles of respiration.  CARDIOVASCULAR: S1, S2 normal. No murmurs, rubs, or gallops. ABDOMEN: Soft, nontender, nondistended. Bowel sounds present. No organomegaly or mass.  EXTREMITIES:2+pittingedema in bilateral lower extremities throughout.nocyanosis, or clubbing.  NEUROLOGIC: Cranial nerves II through XII are intact. Muscle strength 5/5 in all extremities. Sensation intact. Gait not checked.  PSYCHIATRIC: The patient is alert and oriented x 3.  SKIN: No rash, lesion, or ulcer. LABORATORY PANEL:  Female CBC Recent Labs  Lab 03/23/18 0530  WBC 6.1  HGB 12.2  HCT 37.6  PLT 266   ------------------------------------------------------------------------------------------------------------------ Chemistries  Recent Labs  Lab 03/22/18 0453 03/23/18 0530  NA 137 139  K 4.1 5.0  CL 103 106  CO2 26 22  GLUCOSE 110* 73  BUN 24* 31*  CREATININE 1.46* 2.17*  CALCIUM 8.3* 9.1  MG 1.9  --   AST 28 35  ALT 24 24  ALKPHOS 116 127*  BILITOT 2.1* 2.9*   No intake or output data in the 24 hours ending 03/23/18 1511  RADIOLOGY:  No results found. ASSESSMENT AND PLAN:   MyrriellFreemanis a58 y.o.femalewith a known history of systolic congestive heart failure, systemic lupus erythematosus, nonischemic cardiomyopathy, hypertension, hyperlipidemia, CKD stage III, GERD, and stroke whopresentedwith  trouble breathing going on since Tuesday or Wednesday. She complainedof her leg swelling. Weight goingup and down. Has not been walking much in the past 6  months.Occasionally has a cough with white-ishphlegm. Occasionally has some wheezing. She states that her right leg is been heavy since she had a stroke. Hospitality were contacted for admission for congestive heart failure. Last echocardiogram12/26/2019showed an EF of 20 to 25%. Last admission for heart failure exacerbation 01/11/2018.  Has an ICD implanted probably 2012 with generator replacement 2018. Followed by Dr.Greg Ladona Ridgel.  Plan:  1. Acute on chronic systolic congestive heart failure. Remainsvolume up.Lasix increased to 80 mg IV twice daily initially. Patient was on Coreg and lisinopril.Aldactone was discontinued at [facility] previously due to renal insufficiency. Cardiology resumed Aldactone previously. Coreg switched to propanolol.  Monitor renal function. Declining and potassium starting to accumulate.   Scr 1.1 > 1.26 > 1.46 > 2.17 Cardiology/[facility] & EPconsulted, appreciate input. Counseled on diet.  CM consulted for heart failure clinic referral. Form signed. Dailyweights and strict I&O - 03/23/2018 she has been hypotensive and poorly tolerating medications, complaining of nausea and dizziness. As of today hold off on diuresis and antihypertensives - per cardiology, may require right heart catheterization to better assess her cardiac output and filling pressures, may benefit from consultation with advanced heart failure team in [place]  2. Hypokalemia, resolved5.0today.Previous magnesium was low so IV magnesiumgiven.Last Mg 1.9. Morning BMP & Mg to monitor. Discontinue diuretics for now as above.   3.SLE.Per patient cutaneous manifestations only.Continue Plaquenil  4. Congestion, seasonal allergiessentoff flu swab and respiratory panelon admit: negative.History of seasonal allergies.  Takes Zyrtec at home. Continue loratadine. Nasal salinespray as needed.  5. Type 2 diabetes mellituson Humalog 50/50 twice daily + sliding scale. Has been having poor intake in the setting of nausea/dizziness. Ordered Glucerna meal supplements.   6. Depressionon Effexor. Monitor.  7. COPD. Continue inhalers.She explains that she was using her maintenance inhaler as a rescue inhaler at home rather than as scheduled with albuterol for rescue. Counseled and will continue to educate and counsel, and at discharge.  8. Obesitywith a BMI of 36.62. Weight loss needed.Counseling provided.   9. History of strokeon Plavix  10. Headache,resolvedreceivedIV magnesium  11.Weaknessphysical therapy evaluation, will see when she is more medically-stable per notes.  12. Elevated bilirubin with nausea vomiting-right upper quadrant ultrasoundordered by admitting MD General Surgery/Dr. Everlene Farrier consulted: impression is of cholelithiasis with possible mild biliary colic, bilirubin increase likely due to cholestasis from heart failure. No HIDA scan indicated. No surgical intervention immediately indicated. Outpatient follow-up.  13. History Nonsustained VT: amiodarone discontinued by cardiology as it was poorly tolerated in the past. 7 beats VTach 03/20/2018 AM. Potassium given. Continue telemetry. Amiodarone 300 mg q 6 initiated by Dr. Graciela Husbands. Decreased to 200 mg Q 6 per recommendations in note due to nausea/poor tolerance. Amiodarone held 03/23/2018 due to nausea and dizziness. Defer to Dr. Graciela Husbands.   14. Probable sleep apnea:will follow with cardiology, consider outpatient sleep study at D/C.  15.  Anemia on plaquenil. Monitor CBC. Hgb 12.2 today, stable  16. Hypoalbuminemiaup to 3.1. third dose of albumin 5% today. Monitor.  DVT Prophylaxis: subQ Lovenox   All the records are reviewed and case is discussed with Care Management/Social Worker. Management plans  discussed with the patient and/or family and they are in agreement.  CODE STATUS: Full Code  TOTAL TIME TAKING CARE OF THIS PATIENT: 30 minutes.   More than 50% of the time was spent in counseling/coordination of care: YES  POSSIBLE D/C IN 1-2 DAYS, DEPENDING ON CLINICAL CONDITION.   Tramar Brueckner PA-C on 03/23/2018 at 3:11 PM  Between 7am to  6pm - Pager - [phone]  After 6 pm go to [email or web address] - Social research officer, government  [facility] [place] [facility]  Office  [phone]  CC: Primary care physician; Alvina Filbert, MD  Note: This dictation was prepared with Dragon dictation along with smaller phrase technology. Any transcriptional errors that result from this process are unintentional.

## 2018-03-23 NOTE — Consult Note (Signed)
Pharmacy Electrolyte Monitoring Consult:  Pharmacy consulted to assist in monitoring and replacing electrolytes in this 58 y.o. female admitted on 03/19/2018 with Shortness of Breath and Weakness   Labs:  Sodium (mmol/L)  Date Value  03/23/2018 139  03/14/2018 143   Potassium (mmol/L)  Date Value  03/23/2018 5.0   Magnesium (mg/dL)  Date Value  34/74/2595 1.9   Calcium (mg/dL)  Date Value  63/87/5643 9.1   Albumin (g/dL)  Date Value  32/95/1884 3.1 (L)    Assessment/Plan: Electrolyte Goals: Potassium ~4.0  Patient has furosemide 80 mg IV BID ordered.   Potassium 5.0 - No replenishment warranted at this time  Kidney function declining and potassium starting to accumulate.   Scr 1.1 > 1.26 > 1.46 > 2.17  Will order electrolytes with AM labs.  Pharmacy will continue to monitor.  Albina Billet, PharmD, BCPS Clinical Pharmacist 03/23/2018 7:29 AM

## 2018-03-23 NOTE — Progress Notes (Signed)
Progress Note  Patient Name: Katherine [name] Date of Encounter: 03/23/2018  Primary Cardiologist: Ladona Ridgel  Subjective   Device interrogation now available for review which showed the patient had been shocked 17 times since 02/08/2018, each appropriate and successful.   Patient was evaluated by EP on 3/3 with recommendation to resume amiodarone, at lower doses given the above. Her Coreg was also changed to propranolol.  With this, she noted an exacerbation of chronic nausea.  In this setting, her dose of amiodarone has subsequently been decreased to 200 mg.  She continues to note significant lower extremity edema and abdominal fullness with decreased appetite.  She reports her shortness of breath is improved.  No chest pain.  Labs this morning show worsening of her renal function from 1.26-->1.46-->2.17 with a potassium of 3.1-->4.1-->5.0.  Inpatient Medications    Scheduled Meds: . amiodarone  200 mg Oral Q6H  . chlorhexidine  15 mL Mouth Rinse BID  . clopidogrel  75 mg Oral Daily  . enoxaparin (LOVENOX) injection  40 mg Subcutaneous Q24H  . furosemide  80 mg Intravenous BID  . hydroxychloroquine  200 mg Oral BID  . insulin aspart  0-5 Units Subcutaneous QHS  . insulin aspart  0-9 Units Subcutaneous TID WC  . insulin aspart protamine- aspart  10 Units Subcutaneous BID WC  . isosorbide mononitrate  30 mg Oral Daily  . lisinopril  5 mg Oral Daily  . loratadine  10 mg Oral Daily  . mouth rinse  15 mL Mouth Rinse q12n4p  . mometasone-formoterol  2 puff Inhalation BID  . multivitamin with minerals  1 tablet Oral Daily  . ranolazine  500 mg Oral BID  . sodium chloride flush  3 mL Intravenous Once  . sodium chloride flush  3 mL Intravenous Q12H  . spironolactone  12.5 mg Oral Daily  . venlafaxine XR  150 mg Oral Daily   Continuous Infusions: . sodium chloride     PRN Meds: sodium chloride, acetaminophen, albuterol, alum & mag hydroxide-simeth, dimenhyDRINATE, diphenhydrAMINE,  guaiFENesin, hydrocortisone cream, loratadine, ondansetron (ZOFRAN) IV, oxyCODONE-acetaminophen **AND** oxyCODONE, polyethylene glycol, promethazine, sodium chloride, sodium chloride flush   Vital Signs    Vitals:   03/22/18 1748 03/22/18 1954 03/23/18 0014 03/23/18 0524  BP: 91/65 (!) 96/54 96/72 100/73  Pulse: 60 62 60 70  Resp:  16  16  Temp:  (!) 97.5 F (36.4 C)  97.8 F (36.6 C)  TempSrc:  Oral  Oral  SpO2:  95% 99% 98%  Weight:    (!) 142.2 kg  Height:        Intake/Output Summary (Last 24 hours) at 03/23/2018 0859 Last data filed at 03/22/2018 0915 Gross per 24 hour  Intake 3 ml  Output -  Net 3 ml   Filed Weights   03/21/18 1541 03/22/18 0439 03/23/18 0524  Weight: (!) 141.5 kg (!) 141.1 kg (!) 142.2 kg    Telemetry    Sinus rhythm with PVCs- Personally Reviewed  ECG    n/a - Personally Reviewed  Physical Exam   GEN: No acute distress.   Neck: JVD difficult to assess secondary to body habitus. Cardiac: RRR, no murmurs, rubs, or gallops.  Respiratory:  Diminished breath sounds bilaterally.  GI: Soft, nontender, non-distended.   MS:  2+ pitting lower extremity edema to the hips; No deformity. Neuro:  Alert and oriented x 3; Nonfocal.  Psych: Normal affect.  Labs    Chemistry Recent Labs  Lab 03/21/18 0533 03/22/18  0453 03/23/18 0530  NA 139 137 139  K 3.7 4.1 5.0  CL 107 103 106  CO2 24 26 22   GLUCOSE 121* 110* 73  BUN 24* 24* 31*  CREATININE 1.26* 1.46* 2.17*  CALCIUM 8.6* 8.3* 9.1  PROT 6.7 6.6 6.9  ALBUMIN 2.6* 2.8* 3.1*  AST 29 28 35  ALT 23 24 24   ALKPHOS 119 116 127*  BILITOT 2.3* 2.1* 2.9*  GFRNONAA 47* 40* 24*  GFRAA 55* 46* 28*  ANIONGAP 8 8 11      Hematology Recent Labs  Lab 03/21/18 0533 03/22/18 0453 03/23/18 0530  WBC 4.7 4.7 6.1  RBC 3.89 3.91 4.01  HGB 11.9* 11.8* 12.2  HCT 37.3 38.2 37.6  MCV 95.9 97.7 93.8  MCH 30.6 30.2 30.4  MCHC 31.9 30.9 32.4  RDW 19.6* 19.9* 19.6*  PLT 218 241 266    Cardiac  Enzymes Recent Labs  Lab 03/19/18 0744  TROPONINI <0.03   No results for input(s): TROPIPOC in the last 168 hours.   BNP Recent Labs  Lab 03/19/18 0702  BNP >4,500.0*     DDimer No results for input(s): DDIMER in the last 168 hours.   Radiology    No results found.  Cardiac Studies   2D Echo 12/2017: Study Conclusions  - Left ventricle: The cavity size was moderately dilated. Wall   thickness was increased in a pattern of mild LVH. Systolic   function was severely reduced. The estimated ejection fraction   was in the range of 20% to 25%. Diffuse hypokinesis. Features are   consistent with a pseudonormal left ventricular filling pattern,   with concomitant abnormal relaxation and increased filling   pressure (grade 2 diastolic dysfunction). - Ventricular septum: Septal motion showed abnormal function and   dyssynergy. - Aortic valve: Mildly calcified annulus. Trileaflet; mildly   calcified leaflets. There was mild regurgitation. - Mitral valve: Mildly calcified annulus. There was mild   regurgitation. - Left atrium: The atrium was mildly dilated. - Right ventricle: The cavity size was mildly dilated. Pacer wire   or catheter noted in right ventricle. Systolic function was   mildly reduced. - Right atrium: The atrium was mildly dilated. - Tricuspid valve: There was mild regurgitation. - Pulmonary arteries: Systolic pressure was moderately increased.   PA peak pressure: 46 mm Hg (S). - Pericardium, extracardiac: A trivial pericardial effusion was   identified.  Patient Profile     58 y.o. female with history of HFrEF secondary to NICM with an EF of 20-25%, s/p ICD in 2013 with generator change out in 2018, NSVT s/p 2 shocks in 2019 on amiodarone, CKD stage III, IDDM, HTN, and HLD who was admitted with acute on chronic systolic CHF.  Assessment & Plan    1. Acute on chronic HFrEF secondary to NICM: -Patient likely with end-stage heart failure -She remains volume  up -IV diuresis is now limited secondary to worsening renal function -We will provide Lasix holiday for today along with the holding of lisinopril and spironolactone given worsening renal function and a current potassium of 5.0 -If renal function continues to worsen with IV diuresis she may require transfer to the ICU or to [facility] for augmentation of diuresis with milrinone and evaluation by the advanced heart failure team -Coreg has been replaced by propranolol by EP on 3/3, will defer this to them -Consider transfer to [facility] if no significant improvement within the next day or so -Strict I/O -As an outpatient, would recommend patient  establish with the advanced heart failure team for further management  2. VT/VF: -Status post ICD with 17 shocks since 02/08/2018 appropriate for VT/VF -Previously placed on amiodarone though this was not tolerated secondary to acute on chronic nausea.  Now back on amiodarone 200 mg every 6 hours per EP.  She continues to note worsening nausea -If she is unable to tolerate amiodarone she may require transition to mexiletine or ultimately VT ablation -Recommend that EP round on the patient again on 3/5 for further recommendations -Coreg has been transitioned to propranolol per EP -Maintain potassium 4.0 and magnesium 2.0 -Ectopy likely in the setting of worsening heart failure with recommendation to optimize evidence-based heart failure therapy as tolerated  3. CVA with residual left-sided weakness: -Plavix  4. HTN: -Well controlled currently, has been soft -Monitor   5. Hypoalbuminemia: -Improving  5. Acute on CKD stage III: -Provide Lasix holiday -Hold lisinopril and spironolactone  For questions or updates, please contact [facility] Please consult [email or web address] for contact info under Cardiology/STEMI.    Signed, Eula Listen, PA-C [facility] Pager: [phone] 03/23/2018, 8:59 AM

## 2018-03-23 NOTE — Plan of Care (Signed)
  Problem: Health Behavior/Discharge Planning: Goal: Ability to manage health-related needs will improve Outcome: Not Progressing Note:  Per cardiology patient may need a transfer to [facility] for more intensive monitoring of her clinical condition, possibly as early as tomorrow AM. No orders as of yet. Will continue to monitor overall progression. Jari Favre [facility]

## 2018-03-23 NOTE — Progress Notes (Signed)
OT Cancellation Note  Patient Details Name: Katherine [name] MRN: [id number] DOB: 10-26-1960   Cancelled Treatment:    Reason Eval/Treat Not Completed: Other (comment). Consult received, chart reviewed. Per chart review, pt may require transfer to [facility] for monitoring and consultation from advance heart failure team. Will hold OT evaluation at this time and continue to follow acutely for appropriateness of OT pending updated plan of care.   Richrd Prime, MPH, MS, OTR/L ascom [phone] 03/23/18, 4:31 PM

## 2018-03-23 NOTE — Evaluation (Addendum)
Physical Therapy Evaluation Patient Details Name: Katherine [name] MRN: [id number] DOB: 1960-07-12 Today's Date: 03/23/2018   History of Present Illness  Pt is a 58 y.o. female presenting to hospital 03/19/18 with SOB and weakness.  Pt admitted with acute on chronic systolic CHF and hypokalemia.  PMH includes CVA with residual L sided weakness, CHF, CKD, fibromyalgia, h/o seizures, htn, DM, ICD with generator replacement, and lupus.  Clinical Impression  Prior to hospital admission, pt was modified independent ambulating with walker; h/o falls.  Pt lives alone in 1 level home with 4 steps (R rail) to enter.  Pt sitting in recliner upon PT arrival and reporting she has been using BSC with assist.  Currently pt is min to mod assist with transfers and CGA x2 to ambulate 5 feet with RW.  Pt paced and activity limited/modified per pt tolerance.  No c/o pain.  HR in 60's during session and O2 sats WFL on room air.  Pt would benefit from skilled PT to address noted impairments and functional limitations (see below for any additional details).  Upon hospital discharge, recommend pt discharge to [facility].    Follow Up Recommendations SNF    Equipment Recommendations  Rolling walker with 5" wheels    Recommendations for Other Services OT consult     Precautions / Restrictions Precautions Precautions: Fall Precaution Comments: ICD Restrictions Weight Bearing Restrictions: No      Mobility  Bed Mobility               General bed mobility comments: Deferred (pt up in chair beginning/end of session)  Transfers Overall transfer level: Needs assistance Equipment used: Rolling walker (2 wheeled) Transfers: Sit to/from Stand Sit to Stand: Min assist;Mod assist;+2 safety/equipment         General transfer comment: x3 trials (mod assist 1st trial, min to mod assist 2nd trial, and min assist 3rd trial; CGA of 2nd for safety); vc's for UE/LE placement; assist to initiate stand up to  RW  Ambulation/Gait Ambulation/Gait assistance: Min guard;+2 safety/equipment Gait Distance (Feet): 5 Feet Assistive device: Rolling walker (2 wheeled)   Gait velocity: decreased   General Gait Details: mild decreased stance time L LE; vc's to stay closer to [employer] Mobility    Modified Rankin (Stroke Patients Only)       Balance Overall balance assessment: Needs assistance Sitting-balance support: No upper extremity supported;Feet supported Sitting balance-Leahy Scale: Normal Sitting balance - Comments: steady sitting reaching outside BOS   Standing balance support: Single extremity supported Standing balance-Leahy Scale: Poor Standing balance comment: pt requiring at least single UE support for static standing balance                             Pertinent Vitals/Pain Pain Assessment: No/denies pain  Vitals (HR and O2 sats on room air) stable and WFL throughout treatment session.    Home Living Family/patient expects to be discharged to:: Private residence Living Arrangements: Alone   Type of Home: House Home Access: Stairs to enter Entrance Stairs-Rails: Right Entrance Stairs-Number of Steps: 4 Home Layout: One level Home Equipment: Toilet riser;Wheelchair - [employer] - 4 wheels;Walker - 2 wheels;Grab bars - tub/shower      Prior Function Level of Independence: Independent with assistive device(s)         Comments: Ambulatory with RW within home but uses 4ww in community.  Pt report 2 falls in past 6 months.     Hand Dominance        Extremity/Trunk Assessment   Upper Extremity Assessment Upper Extremity Assessment: RUE deficits/detail;LUE deficits/detail RUE Deficits / Details: WFL strength and ROM LUE Deficits / Details: grossly 4/5    Lower Extremity Assessment Lower Extremity Assessment: RLE deficits/detail;LLE deficits/detail RLE Deficits / Details: WFL strength and ROM LLE Deficits / Details:  4-/5 hip flexion and knee flexion/extension, and at least 3/5 DF    Cervical / Trunk Assessment Cervical / Trunk Assessment: Normal  Communication   Communication: No difficulties  Cognition Arousal/Alertness: Awake/alert Behavior During Therapy: WFL for tasks assessed/performed Overall Cognitive Status: Within Functional Limits for tasks assessed                                        General Comments   Nurse cleared pt for participation in physical therapy.  Discussed pt with MD Sherryll Burger this morning: MD Sherryll Burger cleared pt for participation in PT.  Pt agreeable to PT session.    Exercises  Transfer training   Assessment/Plan    PT Assessment Patient needs continued PT services  PT Problem List Decreased strength;Decreased activity tolerance;Decreased balance;Decreased mobility;Decreased knowledge of use of DME;Decreased knowledge of precautions;Cardiopulmonary status limiting activity;Pain       PT Treatment Interventions DME instruction;Gait training;Stair training;Functional mobility training;Therapeutic activities;Therapeutic exercise;Balance training;Patient/family education    PT Goals (Current goals can be found in the Care Plan section)  Acute Rehab PT Goals Patient Stated Goal: to improve strength and go home PT Goal Formulation: With patient Time For Goal Achievement: 04/06/18 Potential to Achieve Goals: Fair    Frequency Min 2X/week   Barriers to discharge Decreased caregiver support      Co-evaluation               AM-PAC PT "6 Clicks" Mobility  Outcome Measure Help needed turning from your back to your side while in a flat bed without using bedrails?: A Lot Help needed moving from lying on your back to sitting on the side of a flat bed without using bedrails?: A Lot Help needed moving to and from a bed to a chair (including a wheelchair)?: A Lot Help needed standing up from a chair using your arms (e.g., wheelchair or bedside chair)?: A  Lot Help needed to walk in hospital room?: A Lot Help needed climbing 3-5 steps with a railing? : Total 6 Click Score: 11    End of Session Equipment Utilized During Treatment: Gait belt Activity Tolerance: Patient tolerated treatment well Patient left: in chair;with call bell/phone within reach;with chair alarm set Nurse Communication: Mobility status;Precautions PT Visit Diagnosis: Other abnormalities of gait and mobility (R26.89);Muscle weakness (generalized) (M62.81);Difficulty in walking, not elsewhere classified (R26.2)    Time: 6767-2094 PT Time Calculation (min) (ACUTE ONLY): 30 min   Charges:   PT Evaluation $PT Eval Low Complexity: 1 Low PT Treatments $Therapeutic Activity: 8-22 mins       Hendricks Limes, PT 03/23/18, 5:02 PM [phone]  Evaluation/treatment session was lead and completed byMegan Schaefer, SPT; directly observed, guided and documented by Hendricks Limes, PT.

## 2018-03-24 ENCOUNTER — Inpatient Hospital Stay (HOSPITAL_COMMUNITY)
Admission: AD | Admit: 2018-03-24 | Discharge: 2018-04-05 | DRG: 291 | Disposition: A | Discharge status: Rehabilitation Facility | Payer: Medicaid Other | Source: Other Acute Inpatient Hospital | Attending: Cardiology | Admitting: Cardiology

## 2018-03-24 ENCOUNTER — Inpatient Hospital Stay: Payer: Self-pay

## 2018-03-24 DIAGNOSIS — Z6841 Body Mass Index (BMI) 40.0 and over, adult: Secondary | ICD-10-CM

## 2018-03-24 DIAGNOSIS — K649 Unspecified hemorrhoids: Secondary | ICD-10-CM | POA: Diagnosis present

## 2018-03-24 DIAGNOSIS — E1169 Type 2 diabetes mellitus with other specified complication: Secondary | ICD-10-CM

## 2018-03-24 DIAGNOSIS — Z886 Allergy status to analgesic agent status: Secondary | ICD-10-CM

## 2018-03-24 DIAGNOSIS — R251 Tremor, unspecified: Secondary | ICD-10-CM | POA: Diagnosis present

## 2018-03-24 DIAGNOSIS — M109 Gout, unspecified: Secondary | ICD-10-CM

## 2018-03-24 DIAGNOSIS — K5909 Other constipation: Secondary | ICD-10-CM | POA: Diagnosis not present

## 2018-03-24 DIAGNOSIS — I428 Other cardiomyopathies: Secondary | ICD-10-CM | POA: Diagnosis present

## 2018-03-24 DIAGNOSIS — E871 Hypo-osmolality and hyponatremia: Secondary | ICD-10-CM | POA: Diagnosis present

## 2018-03-24 DIAGNOSIS — I69354 Hemiplegia and hemiparesis following cerebral infarction affecting left non-dominant side: Secondary | ICD-10-CM

## 2018-03-24 DIAGNOSIS — E785 Hyperlipidemia, unspecified: Secondary | ICD-10-CM | POA: Diagnosis present

## 2018-03-24 DIAGNOSIS — I6930 Unspecified sequelae of cerebral infarction: Secondary | ICD-10-CM

## 2018-03-24 DIAGNOSIS — E669 Obesity, unspecified: Secondary | ICD-10-CM

## 2018-03-24 DIAGNOSIS — I130 Hypertensive heart and chronic kidney disease with heart failure and stage 1 through stage 4 chronic kidney disease, or unspecified chronic kidney disease: Principal | ICD-10-CM | POA: Diagnosis present

## 2018-03-24 DIAGNOSIS — Z8701 Personal history of pneumonia (recurrent): Secondary | ICD-10-CM

## 2018-03-24 DIAGNOSIS — I5022 Chronic systolic (congestive) heart failure: Secondary | ICD-10-CM | POA: Diagnosis present

## 2018-03-24 DIAGNOSIS — K219 Gastro-esophageal reflux disease without esophagitis: Secondary | ICD-10-CM | POA: Diagnosis present

## 2018-03-24 DIAGNOSIS — Z794 Long term (current) use of insulin: Secondary | ICD-10-CM

## 2018-03-24 DIAGNOSIS — Z7902 Long term (current) use of antithrombotics/antiplatelets: Secondary | ICD-10-CM

## 2018-03-24 DIAGNOSIS — E876 Hypokalemia: Secondary | ICD-10-CM

## 2018-03-24 DIAGNOSIS — E1122 Type 2 diabetes mellitus with diabetic chronic kidney disease: Secondary | ICD-10-CM | POA: Diagnosis present

## 2018-03-24 DIAGNOSIS — M329 Systemic lupus erythematosus, unspecified: Secondary | ICD-10-CM | POA: Diagnosis present

## 2018-03-24 DIAGNOSIS — N390 Urinary tract infection, site not specified: Secondary | ICD-10-CM | POA: Diagnosis present

## 2018-03-24 DIAGNOSIS — R5381 Other malaise: Secondary | ICD-10-CM | POA: Diagnosis present

## 2018-03-24 DIAGNOSIS — I509 Heart failure, unspecified: Secondary | ICD-10-CM

## 2018-03-24 DIAGNOSIS — I472 Ventricular tachycardia: Secondary | ICD-10-CM | POA: Diagnosis present

## 2018-03-24 DIAGNOSIS — J45909 Unspecified asthma, uncomplicated: Secondary | ICD-10-CM | POA: Diagnosis present

## 2018-03-24 DIAGNOSIS — N183 Chronic kidney disease, stage 3 (moderate): Secondary | ICD-10-CM | POA: Diagnosis present

## 2018-03-24 DIAGNOSIS — M2012 Hallux valgus (acquired), left foot: Secondary | ICD-10-CM | POA: Diagnosis present

## 2018-03-24 DIAGNOSIS — I5023 Acute on chronic systolic (congestive) heart failure: Secondary | ICD-10-CM | POA: Diagnosis present

## 2018-03-24 DIAGNOSIS — Z87891 Personal history of nicotine dependence: Secondary | ICD-10-CM

## 2018-03-24 DIAGNOSIS — M797 Fibromyalgia: Secondary | ICD-10-CM | POA: Diagnosis present

## 2018-03-24 DIAGNOSIS — Z79899 Other long term (current) drug therapy: Secondary | ICD-10-CM

## 2018-03-24 DIAGNOSIS — N179 Acute kidney failure, unspecified: Secondary | ICD-10-CM | POA: Diagnosis present

## 2018-03-24 DIAGNOSIS — N189 Chronic kidney disease, unspecified: Secondary | ICD-10-CM

## 2018-03-24 DIAGNOSIS — Z91013 Allergy to seafood: Secondary | ICD-10-CM

## 2018-03-24 DIAGNOSIS — R570 Cardiogenic shock: Secondary | ICD-10-CM

## 2018-03-24 DIAGNOSIS — Z8249 Family history of ischemic heart disease and other diseases of the circulatory system: Secondary | ICD-10-CM

## 2018-03-24 DIAGNOSIS — M1990 Unspecified osteoarthritis, unspecified site: Secondary | ICD-10-CM | POA: Diagnosis present

## 2018-03-24 DIAGNOSIS — Z880 Allergy status to penicillin: Secondary | ICD-10-CM

## 2018-03-24 DIAGNOSIS — Z95810 Presence of automatic (implantable) cardiac defibrillator: Secondary | ICD-10-CM

## 2018-03-24 DIAGNOSIS — R0602 Shortness of breath: Secondary | ICD-10-CM | POA: Diagnosis present

## 2018-03-24 LAB — CBC
HCT: 36.7 % (ref 36.0–46.0)
Hemoglobin: 11.7 g/dL — ABNORMAL LOW (ref 12.0–15.0)
MCH: 30.6 pg (ref 26.0–34.0)
MCHC: 31.9 g/dL (ref 30.0–36.0)
MCV: 96.1 fL (ref 80.0–100.0)
Platelets: 276 10*3/uL (ref 150–400)
RBC: 3.82 MIL/uL — ABNORMAL LOW (ref 3.87–5.11)
RDW: 19.9 % — ABNORMAL HIGH (ref 11.5–15.5)
WBC: 6.3 10*3/uL (ref 4.0–10.5)
nRBC: 1.4 % — ABNORMAL HIGH (ref 0.0–0.2)

## 2018-03-24 LAB — COOXEMETRY PANEL
Carboxyhemoglobin: 1.5 % (ref 0.5–1.5)
Methemoglobin: 1.5 % (ref 0.0–1.5)
O2 Saturation: 66.1 %
Total hemoglobin: 12.1 g/dL (ref 12.0–16.0)

## 2018-03-24 LAB — BASIC METABOLIC PANEL
Anion gap: 13 (ref 5–15)
BUN: 37 mg/dL — ABNORMAL HIGH (ref 6–20)
CALCIUM: 9.1 mg/dL (ref 8.9–10.3)
CO2: 20 mmol/L — ABNORMAL LOW (ref 22–32)
Chloride: 104 mmol/L (ref 98–111)
Creatinine, Ser: 2.73 mg/dL — ABNORMAL HIGH (ref 0.44–1.00)
GFR calc Af Amer: 22 mL/min — ABNORMAL LOW (ref >60)
GFR calc non Af Amer: 19 mL/min — ABNORMAL LOW (ref >60)
Glucose, Bld: 128 mg/dL — ABNORMAL HIGH (ref 70–99)
Potassium: 5 mmol/L (ref 3.5–5.1)
SODIUM: 137 mmol/L (ref 135–145)

## 2018-03-24 LAB — GLUCOSE, CAPILLARY
Glucose-Capillary: 109 mg/dL — ABNORMAL HIGH (ref 70–99)
Glucose-Capillary: 122 mg/dL — ABNORMAL HIGH (ref 70–99)
Glucose-Capillary: 81 mg/dL (ref 70–99)

## 2018-03-24 LAB — BRAIN NATRIURETIC PEPTIDE: B Natriuretic Peptide: 893.2 pg/mL — ABNORMAL HIGH (ref 0.0–100.0)

## 2018-03-24 LAB — LACTIC ACID, PLASMA: Lactic Acid, Venous: 5.4 mmol/L (ref 0.5–1.9)

## 2018-03-24 MED ORDER — INSULIN ASPART 100 UNIT/ML ~~LOC~~ SOLN: 0.0000 [IU] | Freq: Every day | SUBCUTANEOUS | 11 refills | Status: DC

## 2018-03-24 MED ORDER — AMIODARONE HCL 200 MG PO TABS
200.0000 mg | ORAL_TABLET | Freq: Four times a day (QID) | ORAL | Status: DC
  Administered 2018-03-25 – 2018-04-02 (×33): 200 mg via ORAL
  Filled 2018-03-24 (×34): qty 1

## 2018-03-24 MED ORDER — GUAIFENESIN 100 MG/5ML PO SOLN: 5.0000 mL | ORAL | 0 refills | Status: AC | PRN

## 2018-03-24 MED ORDER — DIPHENHYDRAMINE HCL 25 MG PO CAPS: 25.0000 mg | ORAL_CAPSULE | Freq: Four times a day (QID) | ORAL | 0 refills | Status: DC | PRN

## 2018-03-24 MED ORDER — RANOLAZINE ER 500 MG PO TB12: 500.0000 mg | ORAL_TABLET | Freq: Two times a day (BID) | ORAL | Status: DC

## 2018-03-24 MED ORDER — SODIUM CHLORIDE 0.9% FLUSH: 3.0000 mL | Freq: Two times a day (BID) | INTRAVENOUS | Status: DC

## 2018-03-24 MED ORDER — PROMETHAZINE HCL 25 MG/ML IJ SOLN: 25.0000 mg | Freq: Four times a day (QID) | INTRAMUSCULAR | 0 refills | Status: DC | PRN

## 2018-03-24 MED ORDER — AMIODARONE HCL 200 MG PO TABS: 200.0000 mg | ORAL_TABLET | Freq: Four times a day (QID) | ORAL | Status: DC

## 2018-03-24 MED ORDER — POLYETHYLENE GLYCOL 3350 17 G PO PACK: 17.0000 g | PACK | Freq: Every day | ORAL | 0 refills | Status: DC | PRN

## 2018-03-24 MED ORDER — VENLAFAXINE HCL ER 150 MG PO CP24
150.0000 mg | ORAL_CAPSULE | Freq: Every day | ORAL | Status: DC
  Administered 2018-03-26 – 2018-04-05 (×11): 150 mg via ORAL
  Filled 2018-03-24 (×12): qty 1

## 2018-03-24 MED ORDER — SODIUM CHLORIDE 0.9% FLUSH: 3.0000 mL | INTRAVENOUS | Status: DC | PRN

## 2018-03-24 MED ORDER — SODIUM CHLORIDE 0.9 % IV SOLN: INTRAVENOUS | Status: DC | PRN

## 2018-03-24 MED ORDER — ALUM & MAG HYDROXIDE-SIMETH 200-200-20 MG/5ML PO SUSP: 30.0000 mL | Freq: Four times a day (QID) | ORAL | 0 refills | Status: AC | PRN

## 2018-03-24 MED ORDER — DOBUTAMINE IN D5W 4-5 MG/ML-% IV SOLN
INTRAVENOUS | Status: AC
  Filled 2018-03-24: qty 250

## 2018-03-24 MED ORDER — PROCHLORPERAZINE EDISYLATE 10 MG/2ML IJ SOLN
5.0000 mg | Freq: Four times a day (QID) | INTRAMUSCULAR | Status: DC | PRN
  Administered 2018-03-28 – 2018-04-05 (×4): 5 mg via INTRAVENOUS
  Filled 2018-03-24 (×5): qty 1

## 2018-03-24 MED ORDER — SODIUM CHLORIDE 0.9 % IV SOLN: 250.0000 mL | INTRAVENOUS | Status: DC | PRN

## 2018-03-24 MED ORDER — MOMETASONE FURO-FORMOTEROL FUM 200-5 MCG/ACT IN AERO
2.0000 | INHALATION_SPRAY | Freq: Two times a day (BID) | RESPIRATORY_TRACT | Status: DC
  Administered 2018-03-25 – 2018-04-05 (×23): 2 via RESPIRATORY_TRACT
  Filled 2018-03-24: qty 8.8

## 2018-03-24 MED ORDER — ONDANSETRON HCL 4 MG/2ML IJ SOLN: 4.0000 mg | Freq: Four times a day (QID) | INTRAMUSCULAR | Status: DC | PRN

## 2018-03-24 MED ORDER — RANOLAZINE ER 500 MG PO TB12
500.0000 mg | ORAL_TABLET | Freq: Two times a day (BID) | ORAL | Status: DC
  Administered 2018-03-25 – 2018-04-05 (×24): 500 mg via ORAL
  Filled 2018-03-24 (×24): qty 1

## 2018-03-24 MED ORDER — ONDANSETRON HCL 4 MG/2ML IJ SOLN: 4.0000 mg | Freq: Four times a day (QID) | INTRAMUSCULAR | 0 refills | Status: DC | PRN

## 2018-03-24 MED ORDER — DIMENHYDRINATE 50 MG PO TABS: 25.0000 mg | ORAL_TABLET | Freq: Three times a day (TID) | ORAL | 0 refills | Status: DC | PRN

## 2018-03-24 MED ORDER — ACETAMINOPHEN 325 MG PO TABS
650.0000 mg | ORAL_TABLET | ORAL | Status: DC | PRN
  Administered 2018-03-25 (×2): 650 mg via ORAL
  Filled 2018-03-24 (×2): qty 2

## 2018-03-24 MED ORDER — INSULIN ASPART 100 UNIT/ML ~~LOC~~ SOLN: 0.0000 [IU] | Freq: Three times a day (TID) | SUBCUTANEOUS | 11 refills | Status: DC

## 2018-03-24 MED ORDER — HYDROCORTISONE 0.5 % EX CREA: TOPICAL_CREAM | Freq: Three times a day (TID) | CUTANEOUS | 0 refills | Status: AC | PRN

## 2018-03-24 MED ORDER — HYDROXYCHLOROQUINE SULFATE 200 MG PO TABS
200.0000 mg | ORAL_TABLET | Freq: Two times a day (BID) | ORAL | Status: DC
  Administered 2018-03-25 – 2018-04-05 (×24): 200 mg via ORAL
  Filled 2018-03-24 (×24): qty 1

## 2018-03-24 MED ORDER — LORATADINE 10 MG PO TABS
10.0000 mg | ORAL_TABLET | Freq: Every day | ORAL | Status: DC
  Administered 2018-03-25 – 2018-04-05 (×12): 10 mg via ORAL
  Filled 2018-03-24 (×12): qty 1

## 2018-03-24 MED ORDER — INSULIN ASPART 100 UNIT/ML ~~LOC~~ SOLN
0.0000 [IU] | Freq: Every day | SUBCUTANEOUS | Status: DC
  Administered 2018-03-29: 2 [IU] via SUBCUTANEOUS
  Administered 2018-03-30: 3 [IU] via SUBCUTANEOUS

## 2018-03-24 MED ORDER — HEPARIN SODIUM (PORCINE) 5000 UNIT/ML IJ SOLN
5000.0000 [IU] | Freq: Three times a day (TID) | INTRAMUSCULAR | Status: DC
  Administered 2018-03-25 – 2018-04-05 (×36): 5000 [IU] via SUBCUTANEOUS
  Filled 2018-03-24 (×37): qty 1

## 2018-03-24 MED ORDER — DOBUTAMINE IN D5W 4-5 MG/ML-% IV SOLN
4.0000 ug/kg/min | INTRAVENOUS | Status: DC
  Administered 2018-03-24: 2.5 ug/kg/min via INTRAVENOUS
  Administered 2018-03-26 – 2018-03-30 (×4): 4 ug/kg/min via INTRAVENOUS
  Filled 2018-03-24 (×4): qty 250

## 2018-03-24 MED ORDER — SALINE SPRAY 0.65 % NA SOLN: 1.0000 | NASAL | 0 refills | Status: DC | PRN

## 2018-03-24 MED ORDER — SODIUM CHLORIDE 0.9% FLUSH
10.0000 mL | Freq: Two times a day (BID) | INTRAVENOUS | Status: DC
  Administered 2018-03-24: 10 mL
  Administered 2018-03-26: 20 mL
  Administered 2018-03-26: 10 mL
  Administered 2018-03-27: 20 mL
  Administered 2018-03-27: 10 mL
  Administered 2018-03-28: 20 mL
  Administered 2018-03-29: 10 mL
  Administered 2018-03-29 – 2018-03-30 (×2): 20 mL
  Administered 2018-03-30 – 2018-04-05 (×9): 10 mL

## 2018-03-24 MED ORDER — FUROSEMIDE 10 MG/ML IJ SOLN
15.0000 mg/h | INTRAVENOUS | Status: DC
  Administered 2018-03-25 – 2018-03-29 (×6): 15 mg/h via INTRAVENOUS
  Filled 2018-03-24: qty 25
  Filled 2018-03-24 (×2): qty 21
  Filled 2018-03-24: qty 25
  Filled 2018-03-24: qty 21
  Filled 2018-03-24: qty 25
  Filled 2018-03-24: qty 21
  Filled 2018-03-24 (×2): qty 25
  Filled 2018-03-24: qty 21

## 2018-03-24 MED ORDER — LORATADINE 10 MG PO TABS: 10.0000 mg | ORAL_TABLET | Freq: Every day | ORAL | Status: DC | PRN

## 2018-03-24 MED ORDER — INSULIN ASPART 100 UNIT/ML ~~LOC~~ SOLN
0.0000 [IU] | Freq: Three times a day (TID) | SUBCUTANEOUS | Status: DC
  Administered 2018-03-26: 1 [IU] via SUBCUTANEOUS
  Administered 2018-03-26 – 2018-03-27 (×2): 2 [IU] via SUBCUTANEOUS
  Administered 2018-03-27 – 2018-03-29 (×4): 1 [IU] via SUBCUTANEOUS
  Administered 2018-03-30: 2 [IU] via SUBCUTANEOUS
  Administered 2018-03-30: 1 [IU] via SUBCUTANEOUS
  Administered 2018-03-30 – 2018-03-31 (×2): 2 [IU] via SUBCUTANEOUS
  Administered 2018-03-31: 5 [IU] via SUBCUTANEOUS
  Administered 2018-03-31: 2 [IU] via SUBCUTANEOUS
  Administered 2018-04-01 – 2018-04-04 (×3): 1 [IU] via SUBCUTANEOUS
  Administered 2018-04-05: 2 [IU] via SUBCUTANEOUS

## 2018-03-24 MED ORDER — ONDANSETRON HCL 4 MG PO TABS
4.0000 mg | ORAL_TABLET | Freq: Three times a day (TID) | ORAL | Status: DC | PRN
  Administered 2018-03-24: 4 mg via ORAL

## 2018-03-24 MED ORDER — CHLORHEXIDINE GLUCONATE 0.12 % MT SOLN: 15.0000 mL | Freq: Two times a day (BID) | OROMUCOSAL | 0 refills | Status: DC

## 2018-03-24 MED ORDER — ONDANSETRON HCL 4 MG PO TABS: 4.0000 mg | ORAL_TABLET | Freq: Three times a day (TID) | ORAL | 0 refills | Status: DC | PRN

## 2018-03-24 MED ORDER — SODIUM CHLORIDE 0.9% FLUSH: 10.0000 mL | INTRAVENOUS | Status: DC | PRN

## 2018-03-24 MED ORDER — FUROSEMIDE 10 MG/ML IJ SOLN
80.0000 mg | Freq: Two times a day (BID) | INTRAMUSCULAR | Status: DC
  Administered 2018-03-24: 80 mg via INTRAVENOUS
  Filled 2018-03-24: qty 8

## 2018-03-24 MED ORDER — CLOPIDOGREL BISULFATE 75 MG PO TABS
75.0000 mg | ORAL_TABLET | Freq: Every day | ORAL | Status: DC
  Administered 2018-03-25 – 2018-04-05 (×12): 75 mg via ORAL
  Filled 2018-03-24 (×12): qty 1

## 2018-03-24 NOTE — Progress Notes (Signed)
OT Cancellation Note  Patient Details Name: Katherine [name] MRN: [id number] DOB: [birth year]   Cancelled Treatment:    Reason Eval/Treat Not Completed: Patient not medically ready. Chart reviewed. Plan to transfer pt to a stepdown bed at [facility] today for further evaluation and management of medical condition. Inappropriate for OT evaluation at this time. Will sign off. Please re-consult OT once pt is medically appropriate for exertional activity.   Richrd Prime, MPH, MS, OTR/L ascom [phone] 03/24/18, 10:07 AM

## 2018-03-24 NOTE — Progress Notes (Signed)
Progress Note  Patient Name: Katherine [name] Date of Encounter: 03/24/2018  Primary Cardiologist: Katherine Bunting, MD   Subjective   Patient continues to have nausea.  Shortness of breath stable to slightly improved.  She still has orthopnea, needing to sleep at a 45 degree angle.  No chest pain.  Leg edema is unchanged.  Abdomen feels more distended and swollen.  Inpatient Medications    Scheduled Meds: . amiodarone  200 mg Oral Q6H  . chlorhexidine  15 mL Mouth Rinse BID  . clopidogrel  75 mg Oral Daily  . enoxaparin (LOVENOX) injection  40 mg Subcutaneous Q24H  . feeding supplement (GLUCERNA SHAKE)  237 mL Oral TID BM  . hydroxychloroquine  200 mg Oral BID  . insulin aspart  0-5 Units Subcutaneous QHS  . insulin aspart  0-9 Units Subcutaneous TID WC  . loratadine  10 mg Oral Daily  . mouth rinse  15 mL Mouth Rinse q12n4p  . mometasone-formoterol  2 puff Inhalation BID  . multivitamin with minerals  1 tablet Oral Daily  . ranolazine  500 mg Oral BID  . sodium chloride flush  3 mL Intravenous Once  . sodium chloride flush  3 mL Intravenous Q12H  . venlafaxine XR  150 mg Oral Daily   Continuous Infusions: . sodium chloride     PRN Meds: sodium chloride, acetaminophen, albuterol, alum & mag hydroxide-simeth, dimenhyDRINATE, diphenhydrAMINE, guaiFENesin, hydrocortisone cream, loratadine, ondansetron (ZOFRAN) IV, ondansetron, oxyCODONE-acetaminophen **AND** oxyCODONE, polyethylene glycol, promethazine, sodium chloride, sodium chloride flush   Vital Signs    Vitals:   03/23/18 1938 03/24/18 0402 03/24/18 0500 03/24/18 0846  BP: 91/68 91/62  96/67  Pulse: (!) 58 61  60  Resp: 20 16  18   Temp: 97.8 F (36.6 C) 97.6 F (36.4 C)  97.7 F (36.5 C)  TempSrc:  Oral  Oral  SpO2: 92% 99%  100%  Weight:   (!) 142.7 kg   Height:        Intake/Output Summary (Last 24 hours) at 03/24/2018 0940 Last data filed at 03/23/2018 2230 Gross per 24 hour  Intake 390 ml  Output 300 ml    Net 90 ml   Last 3 Weights 03/24/2018 03/23/2018 03/22/2018  Weight (lbs) 314 lb 8 oz 313 lb 6.4 oz 311 lb  Weight (kg) 142.656 kg 142.157 kg 141.069 kg      Telemetry    Normal sinus rhythm and atrial pacing with occasional PVCs - Personally Reviewed  ECG    New tracing  Physical Exam   GEN: No acute distress.   Neck: JVP difficult to assess due to body habitus but is likely elevated Cardiac: RRR, no murmurs, rubs, or gallops.  Respiratory:  Katherine [name] diminished breath sounds bilaterally with faint right basilar crackles GI: Soft and mildly distended.  Unable to assess HSM due to body habitus. MS:  1-2+ pitting edema to the hips. Neuro:  Nonfocal  Psych: Normal affect   Labs    Chemistry Recent Labs  Lab 03/21/18 0533 03/22/18 0453 03/23/18 0530 03/24/18 0553  NA 139 137 139 137  K 3.7 4.1 5.0 5.0  CL 107 103 106 104  CO2 24 26 22  20*  GLUCOSE 121* 110* 73 128*  BUN 24* 24* 31* 37*  CREATININE 1.26* 1.46* 2.17* 2.73*  CALCIUM 8.6* 8.3* 9.1 9.1  PROT 6.7 6.6 6.9  --   ALBUMIN 2.6* 2.8* 3.1*  --   AST 29 28 35  --   ALT 23  24 24  --   ALKPHOS 119 116 127*  --   BILITOT 2.3* 2.1* 2.9*  --   GFRNONAA 47* 40* 24* 19*  GFRAA 55* 46* 28* 22*  ANIONGAP 8 8 11 13      Hematology Recent Labs  Lab 03/21/18 0533 03/22/18 0453 03/23/18 0530  WBC 4.7 4.7 6.1  RBC 3.89 3.91 4.01  HGB 11.9* 11.8* 12.2  HCT 37.3 38.2 37.6  MCV 95.9 97.7 93.8  MCH 30.6 30.2 30.4  MCHC 31.9 30.9 32.4  RDW 19.6* 19.9* 19.6*  PLT 218 241 266    Cardiac Enzymes Recent Labs  Lab 03/19/18 0744  TROPONINI <0.03   No results for input(s): TROPIPOC in the last 168 hours.   BNP Recent Labs  Lab 03/19/18 0702  BNP >4,500.0*     DDimer No results for input(s): DDIMER in the last 168 hours.   Radiology    No results found.  Cardiac Studies   2D Echo 12/2017: Study Conclusions  - Left ventricle: The cavity size was moderately dilated. Wall thickness was increased in a  pattern of mild LVH. Systolic function was severely reduced. The estimated ejection fraction was in the range of 20% to 25%. Diffuse hypokinesis. Features are consistent with a pseudonormal left ventricular filling pattern, with concomitant abnormal relaxation and increased filling pressure (grade 2 diastolic dysfunction). - Ventricular septum: Septal motion showed abnormal function and dyssynergy. - Aortic valve: Mildly calcified annulus. Trileaflet; mildly calcified leaflets. There was mild regurgitation. - Mitral valve: Mildly calcified annulus. There was mild regurgitation. - Left atrium: The atrium was mildly dilated. - Right ventricle: The cavity size was mildly dilated. Pacer wire or catheter noted in right ventricle. Systolic function was mildly reduced. - Right atrium: The atrium was mildly dilated. - Tricuspid valve: There was mild regurgitation. - Pulmonary arteries: Systolic pressure was moderately increased. PA peak pressure: 46 mm Hg (S). - Pericardium, extracardiac: A trivial pericardial effusion was identified.  Patient Profile     58 y.o. female with history of HFrEF secondary to NICM with an EF of 20-25%, s/p ICD in 2013 with generator change out in 2018, multiple ICD shocks on amiodarone, CKD stage III, IDDM, HTN, and HLD who was admitted withacute on chronic systolic CHF.  Assessment & Plan    Acute on chronic systolic heart failure due to nonischemic cardiomyopathy Symptomatically, Katherine [name] is not significantly better since yesterday.  Her creatinine continues to rise, concerning for low output heart failure.  By exam, she still appears volume overloaded.  I am concerned that her clinical course reflects low output heart failure.  I worry that addition of propranolol and high-dose oral amiodarone may have worsened her cardiac output.  I have discussed the patient's clinical course with Katherine [name] (heart failure) at [facility].  We will  plan to transfer her to a stepdown bed at [facility] today for further evaluation and management.  Hold beta-blockade at this time, given concern for low output heart failure.  Hold ACE inhibitor/ARB and spironolactone in setting of worsening renal insufficiency.  Patient may need inotropes and/or right heart catheterization to better assess volume status and cardiac output.  In the meantime, I will restart furosemide 80 mg IV twice daily to see if we can decongest her.  Ventricular tachycardia with multiple ICD shocks No VT noted during this admission.  Patient is still nauseated but tolerating amiodarone 200 mg every 6 hours reasonably well.  Continue amiodarone alone per EP.  Beta-blockade on  hold given soft blood pressures and concern for low output heart failure.  Acute kidney injury superimposed on chronic kidney disease stage III My primary concern is for low output heart failure.  Continue to hold ACE inhibitor and spironolactone.  Initiate diuresis, as above.  If renal function worsens, involvement of nephrology at [facility] will need to be considered.  History of stroke No new symptoms with residual left-sided weakness.  Continue clopidogrel for the time being.  Given nonischemic cardiomyopathy with severely reduced LVEF, echo with [facility] may need to be considered at some time to exclude LV thrombus as underlying nidus for stroke.  For questions or updates, please contact [facility] Please consult [email or web address] for contact info under [facility] Card[facility].     Signed, Yvonne Kendall, MD  03/24/2018, 9:40 AM

## 2018-03-24 NOTE — Progress Notes (Signed)
CRITICAL VALUE ALERT  Critical Value:  Lactic Acid 5.4  Date & Time Notied:  03/24/18 - 0705  Provider Notified: on call [facility]  Orders Received/Actions taken: no new orders at this time

## 2018-03-24 NOTE — Procedures (Signed)
Arterial Catheter Insertion Procedure Note Katherine [name] [id number] [birth year]  Procedure: Insertion of Arterial Catheter  Indications: Blood pressure monitoring  Procedure Details Consent: Risks of procedure as well as the alternatives and risks of each were explained to the (patient/caregiver).  Consent for procedure obtained. Time Out: Verified patient identification, verified procedure, site/side was marked, verified correct patient position, special equipment/implants available, medications/allergies/relevent history reviewed, required imaging and test results available.  Performed  Maximum sterile technique was used including antiseptics, cap, gloves, gown, hand hygiene and mask. Skin prep: Chlorhexidine; local anesthetic administered 22 gauge catheter was inserted into left radial artery using the Seldinger technique. ULTRASOUND GUIDANCE USED: YES Evaluation Blood flow good; BP tracing good. Complications: No apparent complications.   Leafy Half 03/24/2018

## 2018-03-24 NOTE — Plan of Care (Signed)
  Problem: Education: Goal: Ability to verbalize understanding of medication therapies will improve Outcome: Progressing   Problem: Education: Goal: Knowledge of General Education information will improve Description Including pain rating scale, medication(s)/side effects and non-pharmacologic comfort measures Outcome: Progressing   Problem: Clinical Measurements: Goal: Will remain free from infection Outcome: Progressing Goal: Respiratory complications will improve Outcome: Progressing Goal: Cardiovascular complication will be avoided Outcome: Progressing   Problem: Nutrition: Goal: Adequate nutrition will be maintained Outcome: Progressing   Problem: Coping: Goal: Level of anxiety will decrease Outcome: Progressing   Problem: Pain Managment: Goal: General experience of comfort will improve Outcome: Progressing   Problem: Safety: Goal: Ability to remain free from injury will improve Outcome: Progressing   Problem: Skin Integrity: Goal: Risk for impaired skin integrity will decrease Outcome: Progressing

## 2018-03-24 NOTE — Care Management Note (Signed)
Case Management Note  Patient Details  Name: ZANAH MISKIEWICZ MRN: [id number] Date of Birth: Aug 19, 1960  Subjective/Objective:        Patient is transferring to [facility].  Notified Rosey Bath with [facility] as she had accepted a referral for [facility] RN.              Action/Plan:   Expected Discharge Date:  03/24/18               Expected Discharge Plan:  Acute to Acute Transfer  In-House Referral:     Discharge planning Services  CM Consult  Post Acute Care Choice:  Home Health Choice offered to:  Patient  DME Arranged:    DME Agency:     HH Arranged:  RN HH Agency:  [facility] ([facility]  Status of Service:  Completed, signed off  If discussed at [employer], dates discussed:    Additional Comments:  Sherren Kerns, RN 03/24/2018, 10:15 AM

## 2018-03-24 NOTE — Consult Note (Signed)
Pharmacy Electrolyte Monitoring Consult:  Pharmacy consulted to assist in monitoring and replacing electrolytes in this 58 y.o. female admitted on 03/19/2018 with Shortness of Breath and Weakness   Labs:  Sodium (mmol/L)  Date Value  03/24/2018 137  03/14/2018 143   Potassium (mmol/L)  Date Value  03/24/2018 5.0   Magnesium (mg/dL)  Date Value  42/35/3614 1.9   Calcium (mg/dL)  Date Value  43/15/4008 9.1   Albumin (g/dL)  Date Value  67/61/9509 3.1 (L)    Assessment/Plan: Electrolyte Goals: Potassium ~4.0  Holding furosemide, spironolactone, and lisinopril   Potassium 5.0 - No replenishment warranted at this time  Kidney function declining and potassium starting to accumulate.   Scr 1.1 > 1.26 > 1.46 > 2.17>2.73  Will order electrolytes with AM labs.  Pharmacy will continue to monitor.  Albina Billet, PharmD, BCPS Clinical Pharmacist 03/24/2018 7:22 AM

## 2018-03-24 NOTE — Progress Notes (Signed)
Peripherally Inserted Central Catheter/Midline Placement  The IV Nurse has discussed with the patient and/or persons authorized to consent for the patient, the purpose of this procedure and the potential benefits and risks involved with this procedure.  The benefits include less needle sticks, lab draws from the catheter, and the patient may be discharged home with the catheter. Risks include, but not limited to, infection, bleeding, blood clot (thrombus formation), and puncture of an artery; nerve damage and irregular heartbeat and possibility to perform a PICC exchange if needed/ordered by physician.  Alternatives to this procedure were also discussed.  Bard Power PICC patient education guide, fact sheet on infection prevention and patient information card has been provided to patient /or left at bedside.    PICC/Midline Placement Documentation  PICC Double Lumen 03/24/18 PICC Right Basilic 41 cm 0 cm (Active)  Indication for Insertion or Continuance of Line Vasoactive infusions 03/24/2018  8:49 PM  Exposed Catheter (cm) 0 cm 03/24/2018  8:49 PM  Site Assessment Clean;Dry;Intact 03/24/2018  8:49 PM  Lumen #1 Status Flushed;Saline locked;Blood return noted 03/24/2018  8:49 PM  Lumen #2 Status Flushed;Saline locked;Blood return noted 03/24/2018  8:49 PM  Dressing Type Transparent 03/24/2018  8:49 PM  Dressing Status Clean;Dry;Intact 03/24/2018  8:49 PM  Dressing Intervention New dressing 03/24/2018  8:49 PM  Dressing Change Due 03/31/18 03/24/2018  8:49 PM       Kadarius Cuffe, Lajean Manes 03/24/2018, 8:50 PM

## 2018-03-24 NOTE — H&P (Signed)
Cardiology Admission History and Physical:   Patient ID: [name] MRN: [id number]; DOB: 07-Jan-1961   Admission date: (Not on file)  Primary Care Provider: Alvina Filbert, MD Primary Cardiologist: Lewayne Bunting, MD  Primary Electrophysiologist:  Lewayne Bunting, MD  Chief Complaint:  Nausea  Patient Profile:   Katherine [name] is a 58 y.o. female with history of chronic systolic heart failure secondary to nonischemic cardiomyopathy (LVEF 20 to 25%) status post ICD, ventricular tachycardia, CKD stage III, diabetes mellitus, hypertension, and hyperlipidemia, who has been transferred from [facility] for further evaluation and management of acute on chronic systolic heart failure.  History of Present Illness:   Katherine [name] was admitted to [facility] on 03/19/2018 with worsening edema and shortness of breath.  She also complained of early satiety and nausea.  Interrogation of her ICD earlier in the month was also notable for 17 appropriate shocks since 02/08/2018 VT/VF.  She was diuresed with improvement in her dyspnea, though she has continued to have significant edema and nausea.  She was seen by Dr. Graciela Husbands in regard to her recurrent VT/VF requiring ICD therapy.  He transitioned her from carvedilol to propranolol and also restarted amiodarone at 300 mg every 6 hours.  Unfortunately, the patient experience worsening nausea secondary to this, requiring de-escalation of amiodarone to 200 mg twice daily.  Since these medication changes 2 days ago, the patient has experienced worsening renal function and edema.  Furosemide and all other evidence-based heart failure therapy were discontinued.  However, her creatinine has continued to trend up.  She continues to complain of orthopnea, needing to sleep at least 45 degrees.  She has dyspnea with minimal activity.  She has not had any chest pain or palpitations.   Past Medical History:  Diagnosis Date  . Anxiety   . Arthritis   . Asthma   . Bronchitis   .  CHF (congestive heart failure) (HCC)    a. EF 20-25% by cath in 11/2010 with cath showing normal cors  . CKD (chronic kidney disease) stage 3, GFR 30-59 ml/min (HCC)   . Fibromyalgia   . GERD (gastroesophageal reflux disease)   . Hallux valgus of left foot   . Hay fever   . Headache(784.0)   . History of pneumonia   . History of seizures   . Hyperlipidemia   . Hypertension   . Nonischemic cardiomyopathy (HCC)   . Obesity   . Stroke (HCC)   . Systemic lupus erythematosus (HCC)   . Type 2 diabetes mellitus (HCC)     Past Surgical History:  Procedure Laterality Date  . ICD  02/13/2011   Medtronic Protecta DR XT  . ICD GENERATOR CHANGEOUT N/A 01/07/2017   Procedure: ICD GENERATOR CHANGEOUT;  Surgeon: Marinus Maw, MD;  Location: [facility] INVASIVE CV LAB;  Service: Cardiovascular;  Laterality: N/A;  . IMPLANTABLE CARDIOVERTER DEFIBRILLATOR IMPLANT N/A 02/13/2011   Procedure: IMPLANTABLE CARDIOVERTER DEFIBRILLATOR IMPLANT;  Surgeon: Thurmon Fair, MD;  Location: [facility] CATH LAB;  Service: Cardiovascular;  Laterality: N/A;  . LEFT HEART CATHETERIZATION WITH CORONARY ANGIOGRAM N/A 12/05/2010   Procedure: LEFT HEART CATHETERIZATION WITH CORONARY ANGIOGRAM;  Surgeon: Chrystie Nose, MD;  Location: [facility] CATH LAB;  Service: Cardiovascular;  Laterality: N/A;  . RIGHT ANKLE    . TUBAL LIGATION    . US ECHOCARDIOGRAPHY  11/09/2010   mild LV enlargement w/conc. LVH & severe global hypokinesis EF 30-35%,mod. diastolic dysfunction, mod. TR,mild AI,mild to mod MR,mild PI     Medications Prior to  Admission: Prior to Admission medications   Medication Sig Start Date Daejah Klebba Date Taking? Authorizing Provider  albuterol (PROVENTIL HFA;VENTOLIN HFA) 108 (90 BASE) MCG/ACT inhaler Inhale 2 puffs into the lungs 4 (four) times daily as needed. For wheezing and shortness of breath    [provider]  alum & mag hydroxide-simeth (MAALOX/MYLANTA) 200-200-20 MG/5ML suspension Take 30 mLs by mouth every 6 (six)  hours as needed for indigestion or heartburn. 03/24/18   Stormy FabianLule, Joana, PA  amiodarone (PACERONE) 200 MG tablet Take 1 tablet (200 mg total) by mouth every 6 (six) hours. 03/24/18   Stormy FabianLule, Joana, PA  cetirizine (ZYRTEC) 10 MG tablet Take 10 mg by mouth daily.     [provider]  chlorhexidine (PERIDEX) 0.12 % solution 15 mLs by Mouth Rinse route 2 (two) times daily. 03/24/18   Stormy FabianLule, Joana, PA  clopidogrel (PLAVIX) 75 MG tablet Take 1 tablet (75 mg total) by mouth daily. 09/23/17   Marinus Mawaylor, Gregg W, MD  COD LIVER OIL PO Take 1 tablet by mouth daily.     [provider]  dimenhyDRINATE (DRAMAMINE) 50 MG tablet Take 0.5 tablets (25 mg total) by mouth every 8 (eight) hours as needed for dizziness. 03/24/18   Stormy FabianLule, Joana, PA  diphenhydrAMINE (BENADRYL) 25 mg capsule Take 1 capsule (25 mg total) by mouth every 6 (six) hours as needed for itching. 03/24/18   Stormy FabianLule, Joana, PA  furosemide (LASIX) 80 MG tablet Take 1 tablet (80 mg total) by mouth daily. 01/24/18 01/24/19  Sherryll BurgerShah, Pratik D, DO  guaiFENesin (ROBITUSSIN) 100 MG/5ML SOLN Take 5 mLs (100 mg total) by mouth every 4 (four) hours as needed for cough or to loosen phlegm. 03/24/18   Stormy FabianLule, Joana, PA  HUMALOG MIX 50/50 KWIKPEN (50-50) 100 UNIT/ML Kwikpen INJECT 30 UNITS INTO THE SKIN TWICE DAILY. 01/28/16   Roma KayserNida, Gebreselassie W, MD  hydrALAZINE (APRESOLINE) 10 MG tablet Take 2 tablets (20 mg total) by mouth every 8 (eight) hours. 01/24/18 03/19/18  Sherryll BurgerShah, Pratik D, DO  hydrocortisone cream 0.5 % Apply topically 3 (three) times daily as needed for itching. 03/24/18   Stormy FabianLule, Joana, PA  hydroxychloroquine (PLAQUENIL) 200 MG tablet Take 200 mg by mouth 2 (two) times daily.    [provider]  insulin aspart (NOVOLOG) 100 UNIT/ML injection Inject 0-9 Units into the skin 3 (three) times daily with meals. 03/24/18   Lule, Joana, PA  insulin aspart (NOVOLOG) 100 UNIT/ML injection Inject 0-5 Units into the skin at bedtime. 03/24/18   Stormy FabianLule, Joana, PA  isosorbide mononitrate  (IMDUR) 30 MG 24 hr tablet Take 1 tablet (30 mg total) by mouth daily. 01/25/18 03/19/18  Sherryll BurgerShah, Pratik D, DO  loratadine (CLARITIN) 10 MG tablet Take 1 tablet (10 mg total) by mouth daily as needed for allergies or rhinitis. 03/24/18   Stormy FabianLule, Joana, PA  Magnesium 400 MG TABS Take 400 mg by mouth daily for 30 days. 03/15/18 04/14/18  Marinus Mawaylor, Gregg W, MD  metoprolol succinate (TOPROL-XL) 50 MG 24 hr tablet Take 1 tablet (50 mg total) by mouth daily. Take with or immediately following a meal. 01/25/18 03/19/18  Sherryll BurgerShah, Pratik D, DO  mometasone-formoterol (DULERA) 200-5 MCG/ACT AERO Inhale 2 puffs into the lungs 2 (two) times daily. 01/24/18 03/19/18  Sherryll BurgerShah, Pratik D, DO  Multiple Vitamins-Minerals (MULTIVITAMIN WOMEN 50+ PO) Take 1 tablet by mouth daily.    [provider]  ondansetron (ZOFRAN) 4 MG tablet Take 1 tablet (4 mg total) by mouth every 8 (eight) hours as  needed for nausea or vomiting. 03/24/18   Lule, Arvil Persons, PA  ondansetron (ZOFRAN) 4 MG/2ML SOLN injection Inject 2 mLs (4 mg total) into the vein every 6 (six) hours as needed for nausea. 03/24/18   Lule, Arvil Persons, PA  polyethylene glycol (MIRALAX / GLYCOLAX) packet Take 17 g by mouth daily as needed for moderate constipation. 03/24/18   Lule, Arvil Persons, PA  potassium chloride SA (K-DUR,KLOR-CON) 20 MEQ tablet Take 2 tablets (40 mEq total) by mouth daily. 01/25/18 03/19/18  Sherryll Burger, Pratik D, DO  promethazine (PHENERGAN) 25 MG/ML injection Inject 1 mL (25 mg total) into the vein every 6 (six) hours as needed for nausea or vomiting. 03/24/18   Stormy Fabian, PA  ranolazine (RANEXA) 500 MG 12 hr tablet Take 1 tablet (500 mg total) by mouth 2 (two) times daily. 03/24/18   Stormy Fabian, PA  simvastatin (ZOCOR) 20 MG tablet TAKE ONE TABLET BY MOUTH AT BEDTIME. 11/24/17   Marinus Maw, MD  sodium chloride (OCEAN) 0.65 % SOLN nasal spray Place 1 spray into both nostrils as needed for congestion. 03/24/18   Stormy Fabian, PA  venlafaxine XR (EFFEXOR-XR) 150 MG 24 hr capsule Take 150 mg by  mouth daily.     [provider]     Allergies:    Allergies  Allergen Reactions  . Aspirin Other (See Comments)  . Penicillins Itching    Can take amoxicillin without a reaction  . Shellfish Allergy Other (See Comments)    Causes to have grand mal seizures    Social History:   Social History   Socioeconomic History  . Marital status: Legally Separated    Spouse name: Not on file  . Number of children: Not on file  . Years of education: Not on file  . Highest education level: Not on file  Occupational History  . Not on file  Social Needs  . Financial resource strain: Not on file  . Food insecurity:    Worry: Not on file    Inability: Not on file  . Transportation needs:    Medical: Not on file    Non-medical: Not on file  Tobacco Use  . Smoking status: Former Smoker    Packs/day: 1.00    Years: 26.00    Pack years: 26.00    Types: Cigarettes    Start date: 07/01/1979    Last attempt to quit: 10/03/2005    Years since quitting: 12.4  . Smokeless tobacco: Never Used  Substance and Sexual Activity  . Alcohol use: Not Currently    Alcohol/week: 0.0 standard drinks    Comment: quit 2007  . Drug use: No  . Sexual activity: Yes  Lifestyle  . Physical activity:    Days per week: Not on file    Minutes per session: Not on file  . Stress: Not on file  Relationships  . Social connections:    Talks on phone: Not on file    Gets together: Not on file    Attends religious service: Not on file    Active member of club or organization: Not on file    Attends meetings of clubs or organizations: Not on file    Relationship status: Not on file  . Intimate partner violence:    Fear of current or ex partner: Not on file    Emotionally abused: Not on file    Physically abused: Not on file    Forced sexual activity: Not on file  Other Topics Concern  .  Not on file  Social History Narrative  . Not on file    Family History:  The patient's family history includes  Diabetes in her father; Heart failure in her mother; Hypertension in her brother, daughter, and sister.    ROS:  Please see the history of present illness. All other ROS reviewed and negative.     Physical Exam/Data:  Temperature 97.7, heart rate 60, respirations 18, blood pressure 96/67, oxygen saturation 100% on room air Last 3 Weights 03/24/2018 03/23/2018 03/22/2018  Weight (lbs) 314 lb 8 oz 313 lb 6.4 oz 311 lb  Weight (kg) 142.656 kg 142.157 kg 141.069 kg     General: Morbidly obese woman, seated in bed. Neck: JVP difficult to assess due to body habitus but is likely elevated Cardiac:RRR, no murmurs, rubs, or gallops.  Respiratory: Audley diminished breath sounds bilaterally with faint right basilar crackles EX:NTZG and mildly distended.  Unable to assess HSM due to body habitus. MS: 1-2+ pitting edema to the hips. Neuro:Nonfocal  Psych: Normal affect   EKG:  The ECG that was done 03/19/2018 was personally reviewed and demonstrates sinus rhythm with left axis deviation and left bundle-like IVCD.  Relevant CV Studies: Echo (01/13/18): - Left ventricle: The cavity size was moderately dilated. Wall   thickness was increased in a pattern of mild LVH. Systolic   function was severely reduced. The estimated ejection fraction   was in the range of 20% to 25%. Diffuse hypokinesis. Features are   consistent with a pseudonormal left ventricular filling pattern,   with concomitant abnormal relaxation and increased filling   pressure (grade 2 diastolic dysfunction). - Ventricular septum: Septal motion showed abnormal function and   dyssynergy. - Aortic valve: Mildly calcified annulus. Trileaflet; mildly   calcified leaflets. There was mild regurgitation. - Mitral valve: Mildly calcified annulus. There was mild   regurgitation. - Left atrium: The atrium was mildly dilated. - Right ventricle: The cavity size was mildly dilated. Pacer wire   or catheter noted in right ventricle. Systolic  function was   mildly reduced. - Right atrium: The atrium was mildly dilated. - Tricuspid valve: There was mild regurgitation. - Pulmonary arteries: Systolic pressure was moderately increased.   PA peak pressure: 46 mm Hg (S). - Pericardium, extracardiac: A trivial pericardial effusion was   identified.  Laboratory Data:  Chemistry Recent Labs  Lab 03/23/18 0530 03/24/18 0553  NA 139 137  K 5.0 5.0  CL 106 104  CO2 22 20*  GLUCOSE 73 128*  BUN 31* 37*  CREATININE 2.17* 2.73*  CALCIUM 9.1 9.1  GFRNONAA 24* 19*  GFRAA 28* 22*  ANIONGAP 11 13    Recent Labs  Lab 03/22/18 0453 03/23/18 0530  PROT 6.6 6.9  ALBUMIN 2.8* 3.1*  AST 28 35  ALT 24 24  ALKPHOS 116 127*  BILITOT 2.1* 2.9*   Hematology Recent Labs  Lab 03/23/18 0530 03/24/18 0549  WBC 6.1 6.3  RBC 4.01 3.82*  HGB 12.2 11.7*  HCT 37.6 36.7  MCV 93.8 96.1  MCH 30.4 30.6  MCHC 32.4 31.9  RDW 19.6* 19.9*  PLT 266 276   Cardiac Enzymes Recent Labs  Lab 03/19/18 0744  TROPONINI <0.03   No results for input(s): TROPIPOC in the last 168 hours.  BNP Recent Labs  Lab 03/19/18 0702  BNP >4,500.0*    DDimer No results for input(s): DDIMER in the last 168 hours.  Radiology/Studies:  No results found.  Assessment and Plan:   Acute on  chronic systolic heart failure due to nonischemic cardiomyopathy Symptomatically, Ms. Eisenstein is not significantly better since yesterday.  Her creatinine continues to rise, concerning for low output heart failure.  By exam, she still appears volume overloaded.  I am concerned that her clinical course reflects low output heart failure.  I worry that addition of propranolol and high-dose oral amiodarone may have worsened her cardiac output.  I have discussed the patient's clinical course with Dr. Shirlee Latch (heart failure) [facility].    Patient transferred to [facility] for heart failure evaluation.  Hold beta-blockade at this time, given concern for low output heart  failure.  Hold ACE inhibitor/ARB and spironolactone in setting of worsening renal insufficiency.  Patient may need inotropes and/or right heart catheterization to better assess volume status and cardiac output.  In the meantime, I will restart furosemide 80 mg IV twice daily to see if we can decongest her.  Ventricular tachycardia with multiple ICD shocks No VT noted during this admission.  Patient is still nauseated but tolerating amiodarone 200 mg every 6 hours reasonably well.  Continue amiodarone load per EP.  Beta-blockade on hold given soft blood pressures and concern for low output heart failure.  Acute kidney injury superimposed on chronic kidney disease stage III My primary concern is for low output heart failure.  Continue to hold ACE inhibitor and spironolactone.  Initiate diuresis, as above. If renal function worsens, involvement of nephrology will need to be considered.  History of stroke No new symptoms with residual left-sided weakness.  Continue clopidogrel for the time being.  Given nonischemic cardiomyopathy with severely reduced LVEF, echo with Definity may need to be considered at some time to exclude LV thrombus as underlying nidus for stroke.  Severity of Illness: The appropriate patient status for this patient is INPATIENT. Inpatient status is judged to be reasonable and necessary in order to provide the required intensity of service to ensure the patient's safety. The patient's presenting symptoms, physical exam findings, and initial radiographic and laboratory data in the context of their chronic comorbidities is felt to place them at high risk for further clinical deterioration. Furthermore, it is not anticipated that the patient will be medically stable for discharge from the hospital within 2 midnights of admission. The following factors support the patient status of inpatient.   " The patient's presenting symptoms include acute systolic heart failure with  evidence of low cardiac output. " The worrisome physical exam findings include significant lower extremity edema. " The initial radiographic and laboratory data are worrisome because of worsening renal function as well as multiple ICD shocks for VT/VF over the last 6 weeks. " The chronic co-morbidities include chronic systolic heart failure, chronic kidney disease stage III, and morbid obesity.  * I certify that at the point of admission it is my clinical judgment that the patient will require inpatient hospital care spanning beyond 2 midnights from the point of admission due to high intensity of service, high risk for further deterioration and high frequency of surveillance required.*   For questions or updates, please contact [facility] Please consult [email or web address] for contact info under   Signed, Yvonne Kendall, MD  03/24/2018 11:40 AM

## 2018-03-24 NOTE — Discharge Summary (Signed)
[facility] [place] [facility]   PATIENT NAME: Katherine [name]    MR#:  [id number]  DATE OF BIRTH:  1960/08/23  DATE OF ADMISSION:  03/19/2018   ADMITTING PHYSICIAN: Alford Highland, MD  DATE OF DISCHARGE: 03/24/2018  PRIMARY CARE PHYSICIAN: Alvina Filbert, MD   ADMISSION DIAGNOSIS:  Acute systolic congestive heart failure (HCC) [I50.21] Elevated bilirubin [R17] Acute on chronic congestive heart failure, unspecified heart failure type (HCC) [I50.9] DISCHARGE DIAGNOSIS:  Active Problems:   CHF (congestive heart failure) (HCC)  SECONDARY DIAGNOSIS:   Past Medical History:  Diagnosis Date  . Anxiety   . Arthritis   . Asthma   . Bronchitis   . CHF (congestive heart failure) (HCC)    a. EF 20-25% by cath in 11/2010 with cath showing normal cors  . CKD (chronic kidney disease) stage 3, GFR 30-59 ml/min (HCC)   . Fibromyalgia   . GERD (gastroesophageal reflux disease)   . Hallux valgus of left foot   . Hay fever   . Headache(784.0)   . History of pneumonia   . History of seizures   . Hyperlipidemia   . Hypertension   . Nonischemic cardiomyopathy (HCC)   . Obesity   . Stroke (HCC)   . Systemic lupus erythematosus (HCC)   . Type 2 diabetes mellitus [name])    HOSPITAL COURSE:   MyrriellFreemanis a58 y.o.femalewith a known history of systolic congestive heart failure, systemic lupus erythematosus, nonischemic cardiomyopathy, hypertension, hyperlipidemia, CKD stage III, GERD, and stroke whopresentedwith trouble breathing going on since Tuesday or Wednesday. She complainedof her leg swelling. Weight goingup and down. Has not been walking much in the past 6 months.Occasionally has a cough with white-ishphlegm. Occasionally has some wheezing. She states that her right leg is been heavy since she had a stroke. Hospitality were contacted for admission for congestive heart failure. Last echocardiogram12/26/2019showed an EF of 20 to 25%. Last  admission for heart failure exacerbation 01/11/2018.  Has an ICD implanted probably 2012 with generator replacement 2018. Followed by Dr.Greg Ladona Ridgel. Status continued to decline, per Cardiology Dr. Okey Dupre she requires transfer to [facility].   Plan:  1. Acute on chronic systolic congestive heart failure. Remainsvolume up.Lasixincreased to 80 mg IV twice daily initially. Patient wason Coreg and lisinopril.Aldactone was discontinued at [facility] previously due to renal insufficiency.Cardiology resumed Aldactone previously.Coreg switched to propanolol. Monitor renal function. Declining and potassium starting to accumulate.  Scr 1.1 > 1.26 > 1.46 > 2.17 > 2.73 Cardiology/[facility] & EPconsulted, appreciate input.Counseled on diet. CM consulted for heart failure clinic referral. Form signed. Dailyweights and strict I&O - 03/23/2018 she has been hypotensive and poorly tolerating medications, complaining of nausea and dizziness. As of today hold off on diuresis and antihypertensives - per cardiology, may require right heart catheterization to better assess her cardiac output and filling pressures, may benefit from consultation with advanced heart failure team in [facility] - 03/24/2018 - patient continues to decline, transfer [facility] per Cardioliogy  2. Hypokalemia, resolved5.0today.Previous magnesium was low so IV magnesiumgiven.Last Mg 1.9. Morning BMP & Mg to monitor. Discontinue diuretics for now as above.    3.SLE.Per patient cutaneous manifestations only.Continue Plaquenil  4. Congestion, seasonal allergiessentoff flu swab and respiratory panelon admit: negative.History of seasonal allergies. Takes Zyrtec at home. Continue loratadine. Nasal salinespray as needed.  5. Type 2 diabetes mellituson Humalog 50/50 twice daily + sliding scale. Has been having poor intake in the setting of nausea/dizziness. Ordered Glucerna meal supplements.  6.  Depressionon Effexor. Monitor.  7. COPD. Continue inhalers.She explains that she was using her maintenance inhaler as a rescue inhaler at home rather than as scheduled with albuterol for rescue. Counseled  8. Obesitywith a BMI of 36.62. Weight loss needed.Counseling provided.  9. History of strokeon Plavix  10. Headache,resolvedreceivedIV magnesium  11.Weaknessphysical therapy evaluation, will see when she is more medically-stable per notes.  12. Elevated bilirubin with nausea vomiting-right upper quadrant ultrasoundordered by admitting MD General Surgery/Dr. Everlene Farrier consulted: impression is of cholelithiasis with possible mild biliary colic, bilirubin increase likely due to cholestasis from heart failure. No HIDA scan indicated. No surgical intervention immediately indicated. Outpatient follow-up.  13. History Nonsustained VT: amiodarone discontinued by cardiology as it was poorly tolerated in the past. 7 beats VTach3/1/2020AM. Potassium given. Continue telemetry. Amiodarone 300 mg q 6 initiated by Dr. Graciela Husbands. Decreased to 200 mg Q 6 per recommendations in note due to nausea/poor tolerance.Amiodarone held 03/23/2018 due to nausea and dizziness. Deferred to Dr. Graciela Husbands while admitted.   14. Probable sleep apnea:will follow with cardiology, consider outpatient sleep study at [facility].  15. Anemia on plaquenil. Monitor CBC.Hgb 12.2 last, stable  16. Hypoalbuminemialast up to 3.1.thirddose of albumin 5% 03/23/18. Monitor.  DVT Prophylaxis: subQ Lovenox   DISCHARGE CONDITIONS:  serious CONSULTS OBTAINED:  Treatment Team:  Duke Salvia, MD DRUG ALLERGIES:   Allergies  Allergen Reactions  . Aspirin Other (See Comments)  . Penicillins Itching    Can take amoxicillin without a reaction  . Shellfish Allergy Other (See Comments)    Causes to have grand mal seizures   DISCHARGE MEDICATIONS:   Allergies as of 03/24/2018      Reactions   Aspirin  Other (See Comments)   Penicillins Itching   Can take amoxicillin without a reaction   Shellfish Allergy Other (See Comments)   Causes to have grand mal seizures      Medication List    TAKE these medications   albuterol 108 (90 Base) MCG/ACT inhaler Commonly known as:  PROVENTIL HFA;VENTOLIN HFA Inhale 2 puffs into the lungs 4 (four) times daily as needed. For wheezing and shortness of breath   alum & mag hydroxide-simeth 200-200-20 MG/5ML suspension Commonly known as:  MAALOX/MYLANTA Take 30 mLs by mouth every 6 (six) hours as needed for indigestion or heartburn.   amiodarone 200 MG tablet Commonly known as:  PACERONE Take 1 tablet (200 mg total) by mouth every 6 (six) hours.   cetirizine 10 MG tablet Commonly known as:  ZYRTEC Take 10 mg by mouth daily.   chlorhexidine 0.12 % solution Commonly known as:  PERIDEX 15 mLs by Mouth Rinse route 2 (two) times daily.   clopidogrel 75 MG tablet Commonly known as:  PLAVIX Take 1 tablet (75 mg total) by mouth daily.   COD LIVER OIL PO Take 1 tablet by mouth daily.   dimenhyDRINATE 50 MG tablet Commonly known as:  DRAMAMINE Take 0.5 tablets (25 mg total) by mouth every 8 (eight) hours as needed for dizziness.   diphenhydrAMINE 25 mg capsule Commonly known as:  BENADRYL Take 1 capsule (25 mg total) by mouth every 6 (six) hours as needed for itching.   furosemide 80 MG tablet Commonly known as:  LASIX Take 1 tablet (80 mg total) by mouth daily.   guaiFENesin 100 MG/5ML Soln Commonly known as:  ROBITUSSIN Take 5 mLs (100 mg total) by mouth every 4 (four) hours as needed for cough or to loosen phlegm.   HUMALOG MIX  50/50 KWIKPEN (50-50) 100 UNIT/ML Kwikpen Generic drug:  Insulin Lispro Prot & Lispro INJECT 30 UNITS INTO THE SKIN TWICE DAILY.   hydrALAZINE 10 MG tablet Commonly known as:  APRESOLINE Take 2 tablets (20 mg total) by mouth every 8 (eight) hours.   hydrocortisone cream 0.5 % Apply topically 3 (three)  times daily as needed for itching.   hydroxychloroquine 200 MG tablet Commonly known as:  PLAQUENIL Take 200 mg by mouth 2 (two) times daily.   insulin aspart 100 UNIT/ML injection Commonly known as:  novoLOG Inject 0-9 Units into the skin 3 (three) times daily with meals.   insulin aspart 100 UNIT/ML injection Commonly known as:  novoLOG Inject 0-5 Units into the skin at bedtime.   isosorbide mononitrate 30 MG 24 hr tablet Commonly known as:  IMDUR Take 1 tablet (30 mg total) by mouth daily.   loratadine 10 MG tablet Commonly known as:  CLARITIN Take 1 tablet (10 mg total) by mouth daily as needed for allergies or rhinitis.   Magnesium 400 MG Tabs Take 400 mg by mouth daily for 30 days.   metoprolol succinate 50 MG 24 hr tablet Commonly known as:  TOPROL-XL Take 1 tablet (50 mg total) by mouth daily. Take with or immediately following a meal.   mometasone-formoterol 200-5 MCG/ACT Aero Commonly known as:  DULERA Inhale 2 puffs into the lungs 2 (two) times daily.   MULTIVITAMIN WOMEN 50+ PO Take 1 tablet by mouth daily.   ondansetron 4 MG/2ML Soln injection Commonly known as:  ZOFRAN Inject 2 mLs (4 mg total) into the vein every 6 (six) hours as needed for nausea.   ondansetron 4 MG tablet Commonly known as:  ZOFRAN Take 1 tablet (4 mg total) by mouth every 8 (eight) hours as needed for nausea or vomiting.   polyethylene glycol packet Commonly known as:  MIRALAX / GLYCOLAX Take 17 g by mouth daily as needed for moderate constipation.   potassium chloride SA 20 MEQ tablet Commonly known as:  K-DUR,KLOR-CON Take 2 tablets (40 mEq total) by mouth daily.   promethazine 25 MG/ML injection Commonly known as:  PHENERGAN Inject 1 mL (25 mg total) into the vein every 6 (six) hours as needed for nausea or vomiting.   ranolazine 500 MG 12 hr tablet Commonly known as:  RANEXA Take 1 tablet (500 mg total) by mouth 2 (two) times daily.   simvastatin 20 MG tablet Commonly  known as:  ZOCOR TAKE ONE TABLET BY MOUTH AT BEDTIME.   sodium chloride 0.65 % Soln nasal spray Commonly known as:  OCEAN Place 1 spray into both nostrils as needed for congestion.   venlafaxine XR 150 MG 24 hr capsule Commonly known as:  EFFEXOR-XR Take 150 mg by mouth daily.        DISCHARGE INSTRUCTIONS:   DIET:  Cardiac diet and Diabetic diet DISCHARGE CONDITION:  Serious ACTIVITY:  Activity as tolerated OXYGEN:  Home Oxygen: No.  Oxygen Delivery: room air DISCHARGE LOCATION:  Transfer Katherine [name]   If you experience worsening of your admission symptoms, develop shortness of breath, life threatening emergency, suicidal or homicidal thoughts you must seek medical attention immediately by calling 911 or calling your MD immediately if your symptoms are severe.  You Must read complete instructions/literature along with all the possible adverse reactions/side effects for all the medicines you take and that have been prescribed to you. Take any new medicines only after you have completely understood and accept all the possible adverse  reactions/side effects.   Please note  You were cared for by a hospitalist during your hospital stay. If you have any questions about your discharge medications or the care you received while you were in the hospital after you are discharged, you can call the unit and asked to speak with the hospitalist on call if the hospitalist that took care of you is not available. Once you are discharged, your primary care physician will handle any further medical issues. Please note that NO REFILLS for any discharge medications will be authorized once you are discharged, as it is imperative that you return to your primary care physician (or establish a relationship with a primary care physician if you do not have one) for your aftercare needs so that they can reassess your need for medications and monitor your lab values.    On the day of Discharge:  VITAL  SIGNS:  Blood pressure 96/67, pulse 60, temperature 97.7 F (36.5 C), temperature source Oral, resp. rate 18, height 6' (1.829 m), weight (!) 142.7 kg, last menstrual period 01/30/2011, SpO2 100 %. PHYSICAL EXAMINATION:  Physical Exam  GENERAL:58 y.o.-year-old patient lying in the bedinno acute distress though she appears uncomfortable EYES: Pupils equal, round, reactive to light and accommodation. No scleral icterus. Extraocular muscles intact.  HEENT: Head atraumatic, normocephalic. Oropharynx and nasopharynx clear.  NECK: Supple, no jugular venous distention. No thyroid enlargement, no tenderness.  LUNGS:Decreasedbreath sounds throughout. No use of accessory muscles of respiration.  CARDIOVASCULAR: S1, S2 normal. No murmurs, rubs, or gallops. ABDOMEN: Soft, nontender, nondistended. Bowel sounds present. No organomegaly or mass.  EXTREMITIES:2+pittingedema in bilateral lower extremities throughout.nocyanosis, or clubbing.  NEUROLOGIC: Cranial nerves II through XII are intact. Muscle strength 5/5 in all extremities. Sensation intact. Gait not checked.  PSYCHIATRIC: The patient is alert and oriented x 3.  SKIN: No rash, lesion, or ulcer. DATA REVIEW:   CBC Recent Labs  Lab 03/23/18 0530  WBC 6.1  HGB 12.2  HCT 37.6  PLT 266    Chemistries  Recent Labs  Lab 03/22/18 0453 03/23/18 0530 03/24/18 0553  NA 137 139 137  K 4.1 5.0 5.0  CL 103 106 104  CO2 26 22 20*  GLUCOSE 110* 73 128*  BUN 24* 31* 37*  CREATININE 1.46* 2.17* 2.73*  CALCIUM 8.3* 9.1 9.1  MG 1.9  --   --   AST 28 35  --   ALT 24 24  --   ALKPHOS 116 127*  --   BILITOT 2.1* 2.9*  --       RADIOLOGY:  No results found.   Management plans discussed with the patient and/or family and they are in agreement.  CODE STATUS: Full Code   TOTAL TIME TAKING CARE OF THIS PATIENT: 45 minutes.    Sebastien Jackson PA-C on 03/24/2018 at 9:33 AM  Between 7am to 6pm - Pager - [phone]  After 6pm go to  [email or web address] - Social research officer, government  [facility]  Office  [phone]  CC: Primary care physician; Alvina Filbert, MD

## 2018-03-24 NOTE — Consult Note (Signed)
Advanced Heart Failure Team Consult Note   Primary Physician: Alvina Filbert, MD PCP-Cardiologist:  Lewayne Bunting, MD  Reason for Consultation: Low output CHF  HPI:    Katherine [name] is seen today for evaluation of low output HF at the request of Dr. Okey Dupre.   58 y.o. with history of nonischemic cardiomyopathy, CVA, and VT was transferred from [facility] for further evaluation of heart failure.  Patient has long history of NICM, diagnosed in 2012.  At that time, cath showed no coronary disease and cardiac MRI showed no infiltrative disease (no LGE) with EF 28%.  Most recent echo was in 12/19, showing moderate LV dilation with EF 20-25%, mildly decreased RV systolic function.   Patient was admitted to [facility] with dyspnea, edema, and nausea.  Device interrogation showed 17 appropriate shocks by ICD since 02/08/18.  Coreg was stopped and she was started on propranolol and back on amiodarone by Dr Graciela Husbands.  She had nausea with amiodarone and dose was decreased.  In the hospital, attempts were made at diuresis, but she had a significant rise in her creatinine.  Given concern for low output, she was transferred to [facility]. Creatinine today was 2.73, up from 1.11 several days ago.   SBP has been low, SBP 75-90 prior to dobutamine initiation.  Lactate was elevated at 5.4.  Dobutamine was started at 2.5 mcg/kg/min. She says that her chest felt heavy when she was initially transferred here but felt better after dobutamine was started.   PICC was placed, CVP 26 mmHg.  Pending co-ox.    Labs: creatinine 1.11 => 1.46 => 2.17 => 2.73, BNP 893, lactate 5.4  Review of Systems: All systems reviewed and negative except as per HPI.  Home Medications Prior to Admission medications   Medication Sig Start Date End Date Taking? Authorizing Provider  albuterol (PROVENTIL HFA;VENTOLIN HFA) 108 (90 BASE) MCG/ACT inhaler Inhale 2 puffs into the lungs 4 (four) times daily as needed. For wheezing and shortness of breath     [provider]  alum & mag hydroxide-simeth (MAALOX/MYLANTA) 200-200-20 MG/5ML suspension Take 30 mLs by mouth every 6 (six) hours as needed for indigestion or heartburn. 03/24/18   Stormy Fabian, PA  amiodarone (PACERONE) 200 MG tablet Take 1 tablet (200 mg total) by mouth every 6 (six) hours. 03/24/18   Stormy Fabian, PA  cetirizine (ZYRTEC) 10 MG tablet Take 10 mg by mouth daily.     [provider]  chlorhexidine (PERIDEX) 0.12 % solution 15 mLs by Mouth Rinse route 2 (two) times daily. 03/24/18   Stormy Fabian, PA  clopidogrel (PLAVIX) 75 MG tablet Take 1 tablet (75 mg total) by mouth daily. 09/23/17   Marinus Maw, MD  COD LIVER OIL PO Take 1 tablet by mouth daily.     [provider]  dimenhyDRINATE (DRAMAMINE) 50 MG tablet Take 0.5 tablets (25 mg total) by mouth every 8 (eight) hours as needed for dizziness. 03/24/18   Stormy Fabian, PA  diphenhydrAMINE (BENADRYL) 25 mg capsule Take 1 capsule (25 mg total) by mouth every 6 (six) hours as needed for itching. 03/24/18   Stormy Fabian, PA  furosemide (LASIX) 80 MG tablet Take 1 tablet (80 mg total) by mouth daily. 01/24/18 01/24/19  Sherryll Burger, Pratik D, DO  guaiFENesin (ROBITUSSIN) 100 MG/5ML SOLN Take 5 mLs (100 mg total) by mouth every 4 (four) hours as needed for cough or to loosen phlegm. 03/24/18   Lule, Joana, PA  HUMALOG MIX 50/50 KWIKPEN (50-50)  100 UNIT/ML Kwikpen INJECT 30 UNITS INTO THE SKIN TWICE DAILY. 01/28/16   Roma Kayser, MD  hydrALAZINE (APRESOLINE) 10 MG tablet Take 2 tablets (20 mg total) by mouth every 8 (eight) hours. 01/24/18 03/19/18  Sherryll Burger, Pratik D, DO  hydrocortisone cream 0.5 % Apply topically 3 (three) times daily as needed for itching. 03/24/18   Stormy Fabian, PA  hydroxychloroquine (PLAQUENIL) 200 MG tablet Take 200 mg by mouth 2 (two) times daily.    [provider]  insulin aspart (NOVOLOG) 100 UNIT/ML injection Inject 0-9 Units into the skin 3 (three) times daily with meals. 03/24/18   Lule, Joana, PA    insulin aspart (NOVOLOG) 100 UNIT/ML injection Inject 0-5 Units into the skin at bedtime. 03/24/18   Stormy Fabian, PA  isosorbide mononitrate (IMDUR) 30 MG 24 hr tablet Take 1 tablet (30 mg total) by mouth daily. 01/25/18 03/19/18  Sherryll Burger, Pratik D, DO  loratadine (CLARITIN) 10 MG tablet Take 1 tablet (10 mg total) by mouth daily as needed for allergies or rhinitis. 03/24/18   Stormy Fabian, PA  Magnesium 400 MG TABS Take 400 mg by mouth daily for 30 days. 03/15/18 04/14/18  Marinus Maw, MD  metoprolol succinate (TOPROL-XL) 50 MG 24 hr tablet Take 1 tablet (50 mg total) by mouth daily. Take with or immediately following a meal. 01/25/18 03/19/18  Sherryll Burger, Pratik D, DO  mometasone-formoterol (DULERA) 200-5 MCG/ACT AERO Inhale 2 puffs into the lungs 2 (two) times daily. 01/24/18 03/19/18  Sherryll Burger, Pratik D, DO  Multiple Vitamins-Minerals (MULTIVITAMIN WOMEN 50+ PO) Take 1 tablet by mouth daily.    [provider]  ondansetron (ZOFRAN) 4 MG tablet Take 1 tablet (4 mg total) by mouth every 8 (eight) hours as needed for nausea or vomiting. 03/24/18   Lule, Arvil Persons, PA  ondansetron (ZOFRAN) 4 MG/2ML SOLN injection Inject 2 mLs (4 mg total) into the vein every 6 (six) hours as needed for nausea. 03/24/18   Lule, Arvil Persons, PA  polyethylene glycol (MIRALAX / GLYCOLAX) packet Take 17 g by mouth daily as needed for moderate constipation. 03/24/18   Lule, Arvil Persons, PA  potassium chloride SA (K-DUR,KLOR-CON) 20 MEQ tablet Take 2 tablets (40 mEq total) by mouth daily. 01/25/18 03/19/18  Sherryll Burger, Pratik D, DO  promethazine (PHENERGAN) 25 MG/ML injection Inject 1 mL (25 mg total) into the vein every 6 (six) hours as needed for nausea or vomiting. 03/24/18   Stormy Fabian, PA  ranolazine (RANEXA) 500 MG 12 hr tablet Take 1 tablet (500 mg total) by mouth 2 (two) times daily. 03/24/18   Stormy Fabian, PA  simvastatin (ZOCOR) 20 MG tablet TAKE ONE TABLET BY MOUTH AT BEDTIME. 11/24/17   Marinus Maw, MD  sodium chloride (OCEAN) 0.65 % SOLN nasal spray Place 1  spray into both nostrils as needed for congestion. 03/24/18   Stormy Fabian, PA  venlafaxine XR (EFFEXOR-XR) 150 MG 24 hr capsule Take 150 mg by mouth daily.     [provider]    Past Medical History: 1. Chronic systolic CHF: Nonischemic cardiomyopathy.  Medtronic ICD.  - Cath in 11/12 with normal coronaries.  - Cardiac MRI 12/12 with no evidence for infiltrative disease (no LGE), EF 28% with moderate LV dilation.  - Echo (12/19): EF 20-25%, moderate LV dilation, mild MR, mildly decreased RV systolic function.  2. H/o CVA: On Plavix.  3. HTN 4. Type 2 diabetes. 5. Hyperlipidemia 6. SLE: On hydroxychloroquine.  7. H/o VT/ICD shocks.   Past Surgical History:  Past Surgical History:  Procedure Laterality Date  . ICD  02/13/2011   Medtronic Protecta DR XT  . ICD GENERATOR CHANGEOUT N/A 01/07/2017   Procedure: ICD GENERATOR CHANGEOUT;  Surgeon: Marinus Maw, MD;  Location: [facility] INVASIVE CV LAB;  Service: Cardiovascular;  Laterality: N/A;  . IMPLANTABLE CARDIOVERTER DEFIBRILLATOR IMPLANT N/A 02/13/2011   Procedure: IMPLANTABLE CARDIOVERTER DEFIBRILLATOR IMPLANT;  Surgeon: Thurmon Fair, MD;  Location: [facility] CATH LAB;  Service: Cardiovascular;  Laterality: N/A;  . LEFT HEART CATHETERIZATION WITH CORONARY ANGIOGRAM N/A 12/05/2010   Procedure: LEFT HEART CATHETERIZATION WITH CORONARY ANGIOGRAM;  Surgeon: Chrystie Nose, MD;  Location: [facility] CATH LAB;  Service: Cardiovascular;  Laterality: N/A;  . RIGHT ANKLE    . TUBAL LIGATION    . US ECHOCARDIOGRAPHY  11/09/2010   mild LV enlargement w/conc. LVH & severe global hypokinesis EF 30-35%,mod. diastolic dysfunction, mod. TR,mild AI,mild to mod MR,mild PI    Family History: Family History  Problem Relation Age of Onset  . Heart failure Mother   . Diabetes Father   . Hypertension Sister   . Hypertension Brother   . Hypertension Daughter     Social History: Social History   Socioeconomic History  . Marital status: Legally Separated      Spouse name: Not on file  . Number of children: Not on file  . Years of education: Not on file  . Highest education level: Not on file  Occupational History  . Not on file  Social Needs  . Financial resource strain: Not on file  . Food insecurity:    Worry: Not on file    Inability: Not on file  . Transportation needs:    Medical: Not on file    Non-medical: Not on file  Tobacco Use  . Smoking status: Former Smoker    Packs/day: 1.00    Years: 26.00    Pack years: 26.00    Types: Cigarettes    Start date: 07/01/1979    Last attempt to quit: 10/03/2005    Years since quitting: 12.4  . Smokeless tobacco: Never Used  Substance and Sexual Activity  . Alcohol use: Not Currently    Alcohol/week: 0.0 standard drinks    Comment: quit 2007  . Drug use: No  . Sexual activity: Yes  Lifestyle  . Physical activity:    Days per week: Not on file    Minutes per session: Not on file  . Stress: Not on file  Relationships  . Social connections:    Talks on phone: Not on file    Gets together: Not on file    Attends religious service: Not on file    Active member of club or organization: Not on file    Attends meetings of clubs or organizations: Not on file    Relationship status: Not on file  Other Topics Concern  . Not on file  Social History Narrative  . Not on file    Allergies:  Allergies  Allergen Reactions  . Aspirin Other (See Comments)  . Penicillins Itching    Can take amoxicillin without a reaction  . Shellfish Allergy Other (See Comments)    Causes to have grand mal seizures    Objective:    Vital Signs:   Temp:  [96.3 F (35.7 C)-97 F (36.1 C)] 97 F (36.1 C) (03/05 2112) Pulse Rate:  [62] 62 (03/05 2112) Resp:  [19] 19 (03/05 2112) BP: (90-104)/(75-89) 104/89 (03/05 2112) SpO2:  [98 %-100 %] 98 % (03/05  2112) Weight:  [134.7 kg] 134.7 kg (03/05 1737)    Weight change: Filed Weights   03/24/18 1737  Weight: 134.7 kg    Intake/Output:  No  intake or output data in the 24 hours ending 03/24/18 2248    Physical Exam    General:  Obese, NAD HEENT: normal Neck: supple. JVP 16+. Carotids 2+ bilat; no bruits. No lymphadenopathy or thyromegaly appreciated. Cor: PMI nonpalpable. Regular rate & rhythm. No rubs, gallops or murmurs. Lungs: Decreased BS at bases.  Abdomen: soft, nontender, nondistended. No hepatosplenomegaly. No bruits or masses. Good bowel sounds. Extremities: no cyanosis, clubbing, rash.  2+ edema to knees.  Neuro: alert & orientedx3, cranial nerves grossly intact. moves all 4 extremities w/o difficulty. Affect pleasant   EKG    NSR, LBBB-like IVCD with QRS 145 msec  Labs   Basic Metabolic Panel: Recent Labs  Lab 03/19/18 0744 03/20/18 0410 03/20/18 1951 03/21/18 0533 03/22/18 0453 03/23/18 0530 03/24/18 0553  NA 141 141  --  139 137 139 137  K 2.3* 2.9* 3.8 3.7 4.1 5.0 5.0  CL 105 106  --  107 103 106 104  CO2 24 27  --  20*  GLUCOSE 72 91  --  121* 110* 73 128*  BUN 23* 21*  --  24* 24* 31* 37*  CREATININE 1.14* 1.11*  --  1.26* 1.46* 2.17* 2.73*  CALCIUM 8.5* 8.2*  --  8.6* 8.3* 9.1 9.1  MG 1.9  --   --  1.8 1.9  --   --     Liver Function Tests: Recent Labs  Lab 03/19/18 0744 03/20/18 0410 03/21/18 0533 03/22/18 0453 03/23/18 0530  AST 38 35  ALT ALKPHOS 134* 112 119 116 127*  BILITOT 3.1* 2.4* 2.3* 2.1* 2.9*  PROT 7.4 6.3* 6.7 6.6 6.9  ALBUMIN 2.9* 2.5* 2.6* 2.8* 3.1*   Recent Labs  Lab 03/19/18 0744  LIPASE 28   No results for input(s): AMMONIA in the last 168 hours.  CBC: Recent Labs  Lab 03/20/18 0410 03/21/18 0533 03/22/18 0453 03/23/18 0530 03/24/18 0549  WBC 4.5 4.7 4.7 6.1 6.3  HGB 11.9* 11.9* 11.8* 12.2 11.7*  HCT 37.6 37.3 38.2 37.6 36.7  MCV 96.4 95.9 97.7 93.8 96.1  PLT 232 218 241 266 276    Cardiac Enzymes: Recent Labs  Lab 03/19/18 0744  TROPONINI <0.03    BNP: BNP (last 3 results) Recent Labs     01/12/18 0410 03/19/18 0702 03/24/18 1752  BNP 2,922.0* >4,500.0* 893.2*    ProBNP (last 3 results) No results for input(s): PROBNP in the last 8760 hours.   CBG: Recent Labs  Lab 03/23/18 1624 03/23/18 2122 03/24/18 0827 03/24/18 1136 03/24/18 2200  GLUCAP 106* 109* 109* 122* 81    Coagulation Studies: No results for input(s): LABPROT, INR in the last 72 hours.   Imaging   [place] Ekg Site Rite  Result Date: 03/24/2018 If Site Rite image not attached, placement could not be confirmed due to current cardiac rhythm.     Medications:     Current Medications: . amiodarone  200 mg Oral Q6H  . [START ON 03/25/2018] clopidogrel  75 mg Oral Daily  . furosemide  80 mg Intravenous BID  . heparin  5,000 Units Subcutaneous Q8H  . hydroxychloroquine  200 mg Oral BID  . insulin aspart  0-5 Units Subcutaneous QHS  . [START ON 03/25/2018] insulin aspart  0-9 Units Subcutaneous TID WC  . [START ON 03/25/2018] loratadine  10 mg Oral Daily  . mometasone-formoterol  2 puff Inhalation BID  . ranolazine  500 mg Oral BID  . sodium chloride flush  10-40 mL Intracatheter Q12H  . [START ON 03/26/2018] venlafaxine XR  150 mg Oral Q breakfast     Infusions: . sodium chloride    . DOBUTamine 2.5 mcg/kg/min (03/24/18 2113)       Patient Profile   58 y.o. with history of nonischemic cardiomyopathy, CVA, and VT was transferred from [facility] for further evaluation of heart failure.  Assessment/Plan   1. Acute on chronic systolic CHF: Nonischemic cardiomyopathy.  Echo in 12/19 with EF 20-25%, mildly decreased RV systolic function.  Medtronic ICD.  She had been at [facility] with CHF exacerbation and VT, creatinine rose considerably with attempts at diuresis, 2.7 today.  Lactate was elevated.  I am concerned that this is low output HF.  BP is hard to obtain and numbers fluctuate considerably (SBP 70s-110s from cuff).  Dobutamine was started empirically at 4 mcg/kg/min.  On exam, she is markedly volume  overloaded with CVP 26. Poor response to Lasix 80 mg IV bolus this evening.  - Send co-ox now.  - Continue dobutamine 4 mcg/kg/min.  - She is mildly drowsy and BP is hard to obtain by cuff given large arms.  Will place arterial line for now, may be able to discontinue soon.  -  Now that dobutamine is started, I will start her on Lasix gtt at 15 mg/hr.  2. AKI: Creatinine up to 2.7 from 1.11.  Suspect cardiorenal in setting of low cardiac output. She is markedly volume overloaded.  - Place foley and will get renal US.  - Hopefully, improving CO with dobutamine will help creatinine.  - If creatinine continues to rise, I will consult nephrology.  3. VT: Patient had 17 episodes of VT with ICD discharges since 1/20.  It is possible that CHF was trigger of VT, she is markedly volume overloaded.  - Currently getting amiodarone 200 mg every 6 hrs per EP.  - She is on ranolazine.  - Beta blockers on hold with suspected low output.  4. H/o CVA: On Plavix.  5. H/o SLE: On hydroxychloroquine.   Length of Stay: 0  Marca Ancona, MD  03/24/2018, 10:48 PM  Advanced Heart Failure Team Pager [phone] (M-F; 7a - 4p)  Please contact [facility] for night-coverage after hours (4p -7a ) and weekends on [email or web address]

## 2018-03-25 ENCOUNTER — Encounter (HOSPITAL_COMMUNITY): Payer: Self-pay | Admitting: General Practice

## 2018-03-25 ENCOUNTER — Inpatient Hospital Stay (HOSPITAL_COMMUNITY): Payer: Medicaid Other

## 2018-03-25 ENCOUNTER — Other Ambulatory Visit: Payer: Self-pay

## 2018-03-25 LAB — BASIC METABOLIC PANEL
Anion gap: 10 (ref 5–15)
Anion gap: 8 (ref 5–15)
BUN: 39 mg/dL — ABNORMAL HIGH (ref 6–20)
BUN: 39 mg/dL — AB (ref 6–20)
CO2: 22 mmol/L (ref 22–32)
CO2: 24 mmol/L (ref 22–32)
Calcium: 8.9 mg/dL (ref 8.9–10.3)
Calcium: 8.9 mg/dL (ref 8.9–10.3)
Chloride: 102 mmol/L (ref 98–111)
Chloride: 104 mmol/L (ref 98–111)
Creatinine, Ser: 3.07 mg/dL — ABNORMAL HIGH (ref 0.44–1.00)
Creatinine, Ser: 2.99 mg/dL — ABNORMAL HIGH (ref 0.44–1.00)
GFR calc Af Amer: 19 mL/min — ABNORMAL LOW (ref >60)
GFR calc non Af Amer: 16 mL/min — ABNORMAL LOW (ref >60)
GFR calc non Af Amer: 17 mL/min — ABNORMAL LOW (ref >60)
GFR, EST AFRICAN AMERICAN: 19 mL/min — AB (ref >60)
Glucose, Bld: 100 mg/dL — ABNORMAL HIGH (ref 70–99)
Glucose, Bld: 101 mg/dL — ABNORMAL HIGH (ref 70–99)
POTASSIUM: 4.8 mmol/L (ref 3.5–5.1)
Potassium: 4.7 mmol/L (ref 3.5–5.1)
Sodium: 134 mmol/L — ABNORMAL LOW (ref 135–145)
Sodium: 136 mmol/L (ref 135–145)

## 2018-03-25 LAB — CBC WITH DIFFERENTIAL/PLATELET
ABS IMMATURE GRANULOCYTES: 0.05 10*3/uL (ref 0.00–0.07)
Basophils Absolute: 0 10*3/uL (ref 0.0–0.1)
Basophils Relative: 1 %
Eosinophils Absolute: 0.1 10*3/uL (ref 0.0–0.5)
Eosinophils Relative: 1 %
HCT: 34.1 % — ABNORMAL LOW (ref 36.0–46.0)
Hemoglobin: 11.2 g/dL — ABNORMAL LOW (ref 12.0–15.0)
Immature Granulocytes: 1 %
Lymphocytes Relative: 24 %
Lymphs Abs: 1.9 10*3/uL (ref 0.7–4.0)
MCH: 30.8 pg (ref 26.0–34.0)
MCHC: 32.8 g/dL (ref 30.0–36.0)
MCV: 93.7 fL (ref 80.0–100.0)
Monocytes Absolute: 0.6 10*3/uL (ref 0.1–1.0)
Monocytes Relative: 7 %
Neutro Abs: 5.1 10*3/uL (ref 1.7–7.7)
Neutrophils Relative %: 66 %
Platelets: 242 10*3/uL (ref 150–400)
RBC: 3.64 MIL/uL — ABNORMAL LOW (ref 3.87–5.11)
RDW: 19.3 % — ABNORMAL HIGH (ref 11.5–15.5)
WBC: 7.7 10*3/uL (ref 4.0–10.5)
nRBC: 0.9 % — ABNORMAL HIGH (ref 0.0–0.2)

## 2018-03-25 LAB — COMPREHENSIVE METABOLIC PANEL
ALT: 26 U/L (ref 0–44)
AST: 43 U/L — ABNORMAL HIGH (ref 15–41)
Albumin: 2.6 g/dL — ABNORMAL LOW (ref 3.5–5.0)
Alkaline Phosphatase: 131 U/L — ABNORMAL HIGH (ref 38–126)
Anion gap: 11 (ref 5–15)
BUN: 40 mg/dL — ABNORMAL HIGH (ref 6–20)
CALCIUM: 9 mg/dL (ref 8.9–10.3)
CO2: 20 mmol/L — ABNORMAL LOW (ref 22–32)
Chloride: 102 mmol/L (ref 98–111)
Creatinine, Ser: 3.17 mg/dL — ABNORMAL HIGH (ref 0.44–1.00)
GFR calc Af Amer: 18 mL/min — ABNORMAL LOW (ref >60)
GFR calc non Af Amer: 15 mL/min — ABNORMAL LOW (ref >60)
Glucose, Bld: 117 mg/dL — ABNORMAL HIGH (ref 70–99)
Potassium: 5.2 mmol/L — ABNORMAL HIGH (ref 3.5–5.1)
Sodium: 133 mmol/L — ABNORMAL LOW (ref 135–145)
Total Bilirubin: 2.6 mg/dL — ABNORMAL HIGH (ref 0.3–1.2)
Total Protein: 6.1 g/dL — ABNORMAL LOW (ref 6.5–8.1)

## 2018-03-25 LAB — URINALYSIS, ROUTINE W REFLEX MICROSCOPIC
BILIRUBIN URINE: NEGATIVE
Glucose, UA: NEGATIVE mg/dL
Hgb urine dipstick: NEGATIVE
Ketones, ur: NEGATIVE mg/dL
Nitrite: NEGATIVE
Protein, ur: 30 mg/dL — AB
Specific Gravity, Urine: 1.018 (ref 1.005–1.030)
pH: 5 (ref 5.0–8.0)

## 2018-03-25 LAB — GLUCOSE, CAPILLARY
Glucose-Capillary: 98 mg/dL (ref 70–99)
Glucose-Capillary: 118 mg/dL — ABNORMAL HIGH (ref 70–99)
Glucose-Capillary: 94 mg/dL (ref 70–99)
Glucose-Capillary: 129 mg/dL — ABNORMAL HIGH (ref 70–99)

## 2018-03-25 LAB — COOXEMETRY PANEL
Carboxyhemoglobin: 0.9 % (ref 0.5–1.5)
Carboxyhemoglobin: 1.7 % — ABNORMAL HIGH (ref 0.5–1.5)
Methemoglobin: 1.5 % (ref 0.0–1.5)
Methemoglobin: 1 % (ref 0.0–1.5)
O2 SAT: 38.2 %
O2 Saturation: 61.4 %
Total hemoglobin: 12.8 g/dL (ref 12.0–16.0)
Total hemoglobin: 9.1 g/dL — ABNORMAL LOW (ref 12.0–16.0)

## 2018-03-25 LAB — LACTIC ACID, PLASMA
LACTIC ACID, VENOUS: 2.1 mmol/L — AB (ref 0.5–1.9)
Lactic Acid, Venous: 2.5 mmol/L (ref 0.5–1.9)

## 2018-03-25 MED ORDER — POTASSIUM CHLORIDE CRYS ER 20 MEQ PO TBCR
40.0000 meq | EXTENDED_RELEASE_TABLET | Freq: Once | ORAL | Status: AC
  Administered 2018-03-25: 40 meq via ORAL
  Filled 2018-03-25: qty 2

## 2018-03-25 MED ORDER — OXYCODONE-ACETAMINOPHEN 5-325 MG PO TABS
1.0000 | ORAL_TABLET | Freq: Four times a day (QID) | ORAL | Status: DC | PRN
  Administered 2018-03-25 – 2018-04-01 (×12): 1 via ORAL
  Filled 2018-03-25 (×12): qty 1

## 2018-03-25 MED ORDER — DIPHENHYDRAMINE HCL 50 MG/ML IJ SOLN
12.5000 mg | Freq: Three times a day (TID) | INTRAMUSCULAR | Status: DC | PRN
  Administered 2018-03-25 – 2018-03-31 (×8): 12.5 mg via INTRAVENOUS
  Filled 2018-03-25 (×9): qty 1

## 2018-03-25 MED ORDER — METOLAZONE 2.5 MG PO TABS
2.5000 mg | ORAL_TABLET | Freq: Once | ORAL | Status: AC
  Administered 2018-03-25: 2.5 mg via ORAL
  Filled 2018-03-25: qty 1

## 2018-03-25 MED ORDER — ADULT MULTIVITAMIN W/MINERALS CH
1.0000 | ORAL_TABLET | Freq: Every day | ORAL | Status: DC
  Administered 2018-03-25 – 2018-04-05 (×12): 1 via ORAL
  Filled 2018-03-25 (×12): qty 1

## 2018-03-25 MED ORDER — CIPROFLOXACIN HCL 500 MG PO TABS
250.0000 mg | ORAL_TABLET | Freq: Two times a day (BID) | ORAL | Status: AC
  Administered 2018-03-25 – 2018-03-29 (×10): 250 mg via ORAL
  Filled 2018-03-25 (×10): qty 1

## 2018-03-25 MED ORDER — GLUCERNA SHAKE PO LIQD
237.0000 mL | Freq: Three times a day (TID) | ORAL | Status: DC
  Administered 2018-03-25 – 2018-04-05 (×30): 237 mL via ORAL

## 2018-03-25 MED FILL — Ondansetron HCl Inj 4 MG/2ML (2 MG/ML): INTRAMUSCULAR | Qty: 2 | Status: AC

## 2018-03-25 NOTE — Progress Notes (Signed)
Called due to discomfort in legs with Una Boots, Lt > Rt discomfort and mostly at ankles.  She has hx of arthritis.  Toes are equally warm LT and Rt.  Brisk capillary refill.  Will add percocet to take the edge off.

## 2018-03-25 NOTE — Progress Notes (Addendum)
Patient ID: Katherine [name], female   DOB: 08-08-1960, 58 y.o.   MRN: [id number]     Advanced Heart Failure Rounding Note  PCP-Cardiologist: Lewayne Bunting, MD   Subjective:    Creatinine 2.7 => 3.17 => 3.07.  She was started on dobutamine at 4, co-ox 61% this morning.  UOP picking up, not much recorded overnight but there is a lot in the foley bag currently.  CVP remains > 20.   SBP 90s-100s, stable.   Lactate trending down 5.4 => 2.5   Objective:   Weight Range: (!) 143.8 kg Body mass index is 42.99 kg/m.   Vital Signs:   Temp:  [96.3 F (35.7 C)-97.5 F (36.4 C)] 97.5 F (36.4 C) (03/06 0327) Pulse Rate:  [60-66] 62 (03/06 0600) Resp:  [0-20] 13 (03/06 0600) BP: (90-106)/(59-89) 106/59 (03/05 2300) SpO2:  [94 %-100 %] 96 % (03/06 0600) Arterial Line BP: (96-122)/(53-61) 96/54 (03/06 0600) Weight:  [134.7 kg-143.8 kg] 143.8 kg (03/06 0359)    Weight change: Filed Weights   03/24/18 1737 03/25/18 0359  Weight: 134.7 kg (!) 143.8 kg    Intake/Output:   Intake/Output Summary (Last 24 hours) at 03/25/2018 0741 Last data filed at 03/25/2018 0600 Gross per 24 hour  Intake 152.04 ml  Output 650 ml  Net -497.96 ml      Physical Exam    General:  Obese, NAD HEENT: Normal Neck: Supple. JVP 16+. Carotids 2+ bilat; no bruits. No lymphadenopathy or thyromegaly appreciated. Cor: PMI nonpalpable. Regular rate & rhythm. No rubs, gallops or murmurs. Lungs: Decreased at bases.  Abdomen: Soft, nontender, nondistended. No hepatosplenomegaly. No bruits or masses. Good bowel sounds. Extremities: No cyanosis, clubbing, rash. 2+ edema to thighs.  Neuro: Alert & orientedx3, cranial nerves grossly intact. moves all 4 extremities w/o difficulty. Affect pleasant   Telemetry   NSR 60s (personally reviewed).   Labs    CBC Recent Labs    03/24/18 0549 03/25/18 0610  WBC 6.3 7.7  NEUTROABS  --  5.1  HGB 11.7* 11.2*  HCT 36.7 34.1*  MCV 96.1 93.7  PLT 276 242   Basic  Metabolic Panel Recent Labs    77/41/28 0030 03/25/18 0610  NA 133* 134*  K 5.2* 4.8  CL 102 102  CO2 20* 22  GLUCOSE 117* 100*  BUN 40* 39*  CREATININE 3.17* 3.07*  CALCIUM 9.0 8.9   Liver Function Tests Recent Labs    03/23/18 0530 03/25/18 0030  AST 35 43*  ALT 24 26  ALKPHOS 127* 131*  BILITOT 2.9* 2.6*  PROT 6.9 6.1*  ALBUMIN 3.1* 2.6*   No results for input(s): LIPASE, AMYLASE in the last 72 hours. Cardiac Enzymes No results for input(s): CKTOTAL, CKMB, CKMBINDEX, TROPONINI in the last 72 hours.  BNP: BNP (last 3 results) Recent Labs    01/12/18 0410 03/19/18 0702 03/24/18 1752  BNP 2,922.0* >4,500.0* 893.2*    ProBNP (last 3 results) No results for input(s): PROBNP in the last 8760 hours.   D-Dimer No results for input(s): DDIMER in the last 72 hours. Hemoglobin A1C No results for input(s): HGBA1C in the last 72 hours. Fasting Lipid Panel No results for input(s): CHOL, HDL, LDLCALC, TRIG, CHOLHDL, LDLDIRECT in the last 72 hours. Thyroid Function Tests No results for input(s): TSH, T4TOTAL, T3FREE, THYROIDAB in the last 72 hours.  Invalid input(s): FREET3  Other results:   Imaging    [place] Ekg Site Rite  Result Date: 03/24/2018 If [employer] not  attached, placement could not be confirmed due to current cardiac rhythm.     Medications:     Scheduled Medications: . amiodarone  200 mg Oral Q6H  . ciprofloxacin  250 mg Oral BID  . clopidogrel  75 mg Oral Daily  . heparin  5,000 Units Subcutaneous Q8H  . hydroxychloroquine  200 mg Oral BID  . insulin aspart  0-5 Units Subcutaneous QHS  . insulin aspart  0-9 Units Subcutaneous TID WC  . loratadine  10 mg Oral Daily  . metolazone  2.5 mg Oral Once  . mometasone-formoterol  2 puff Inhalation BID  . potassium chloride  40 mEq Oral Once  . ranolazine  500 mg Oral BID  . sodium chloride flush  10-40 mL Intracatheter Q12H  . [START ON 03/26/2018] venlafaxine XR  150 mg Oral Q breakfast      Infusions: . sodium chloride    . sodium chloride    . DOBUTamine 4 mcg/kg/min (03/25/18 0600)  . furosemide (LASIX) infusion 15 mg/hr (03/25/18 0600)     PRN Medications:  sodium chloride, Place/Maintain arterial line **AND** sodium chloride, acetaminophen, prochlorperazine, sodium chloride flush     Assessment/Plan   1. Acute on chronic systolic CHF: Nonischemic cardiomyopathy.  Echo in 12/19 with EF 20-25%, mildly decreased RV systolic function.  Medtronic ICD.  She had been at [facility] with CHF exacerbation and VT, creatinine rose considerably with attempts at diuresis.  Lactate was elevated.  I am concerned that this is low output HF.  SBP was low at times in the 80s-90s.  Dobutamine was started empirically at 4 mcg/kg/min, BP now stable in 90s-100s on arterial line. Today, co-ox 61%, creatinine trending down now 3.17 => 3.07.  On exam, she remains volume overloaded with CVP >20. UOP appears to have picked up this morning. - Continue dobutamine 4 mcg/kg/min.  - Continue Lasix gtt at 15 mg/hr and will add metolazone 2.5 mg x 1.  - Unna boots 2. AKI: Creatinine up to peak of 3.17 from 1.11.  Suspect cardiorenal in setting of low cardiac output. She is markedly volume overloaded.  Creatinine mildly lower today on dobutamine.  - renal US.  - Hopefully, improving CO with dobutamine will help creatinine.  - If creatinine does not come down, I will consult nephrology.  3. VT: Patient had 17 episodes of VT with ICD discharges since 1/20.  It is possible that CHF was trigger of VT, she is markedly volume overloaded.  - Currently getting amiodarone 200 mg every 6 hrs per EP.  - She is on ranolazine.  - Beta blockers on hold with suspected low output.  4. H/o CVA: On Plavix.  5. H/o SLE: On hydroxychloroquine.  6. UTI: Cipro.   Length of Stay: 1  Marca Ancona, MD  03/25/2018, 7:41 AM  Advanced Heart Failure Team Pager [phone] (M-F; 7a - 4p)  Please contact [facility] for  night-coverage after hours (4p -7a ) and weekends on [email or web address]

## 2018-03-25 NOTE — Care Management Note (Signed)
Case Management Note Previous note created by Zerita Boers  Patient Details  Name: Katherine [name] MRN: [id number] Date of Birth: [birth year]  Subjective/Objective:        Patient is transferring to [facility]  Notified Rosey Bath with [facility] as she had accepted a referral for [facility] RN.              Action/Plan:   PTA from home alone- pt informed CM she has a walker.  PT ordered as pt informed CM that she was basically dragging her feet around PTA.  CM will continue to follow   Expected Discharge Date:                  Expected Discharge Plan:  Skilled Nursing Facility  In-House Referral:  Clinical Social Work  Discharge planning Services  CM Consult  Post Acute Care Choice:    Choice offered to:     DME Arranged:    DME Agency:     HH Arranged:    HH Agency:     Status of Service:  In process, will continue to follow  If discussed at Long Length of Stay Meetings, dates discussed:    Additional Comments:  Cherylann Parr, RN 03/25/2018, 4:00 PM

## 2018-03-25 NOTE — Progress Notes (Signed)
Initial Nutrition Assessment  DOCUMENTATION CODES:   Morbid obesity  INTERVENTION:   -Glucerna Shake po TID, each supplement provides 220 kcal and 10 grams of protein -MVI with minerals daily  NUTRITION DIAGNOSIS:   Inadequate oral intake related to decreased appetite as evidenced by meal completion < 50%.  GOAL:   Patient will meet greater than or equal to 90% of their needs  MONITOR:   PO intake, Supplement acceptance, Labs, Weight trends, Skin, I & O's  REASON FOR ASSESSMENT:   Malnutrition Screening Tool    ASSESSMENT:   Katherine [name] is a 58 y.o. female with history of chronic systolic heart failure secondary to nonischemic cardiomyopathy (LVEF 20 to 25%) status post ICD, ventricular tachycardia, CKD stage III, diabetes mellitus, hypertension, and hyperlipidemia, who has been transferred from [facility] for further evaluation and management of acute on chronic systolic heart failure.  Pt admitted with acute on chronic CHF due to ischemic cardiomyopathy.   Spoke with pt at bedside, who was tearful at time of visit. She reports poor appetite over the past 6 months due to the deaths of two close family members over this time period. Pt reports feeling depressed and not having the desire or motivation to eat. She shares that she usually consumes one meal per day, which will consist of soup or oodles of noodles. She will also sip on a glucerna supplement daily for additional nutrition.   Pt denies wt loss. She reports dry wt of around 269#; endorses wt gain related to edema.   Observed pt breakfast tray, which was untouched, however, she did consume a glucerna supplement. RD discussed importance of good nutritional intake to promote healing. Encouraged to eat what she could off meals trays. Pt also amenable to Glucerna supplements between meals.   Lab Results  Component Value Date   HGBA1C 6.8 (H) 03/20/2018   PTA DM medications are 30 units humalog BID, 0-9 units insulin  aspart TID, and 0-5 units insulin aspart q HS.   Labs reviewed: CBGS: 118 (inpatient orders for glycemic control are 0-5 units insulin aspar q HS and 0-9 units insulin aspart TID with meals).   NUTRITION - FOCUSED PHYSICAL EXAM:    Most Recent Value  Orbital Region  No depletion  Upper Arm Region  No depletion  Thoracic and Lumbar Region  No depletion  Buccal Region  No depletion  Temple Region  No depletion  Clavicle Bone Region  No depletion  Clavicle and Acromion Bone Region  No depletion  Scapular Bone Region  No depletion  Dorsal Hand  No depletion  Patellar Region  No depletion  Anterior Thigh Region  No depletion  Posterior Calf Region  No depletion  Edema (RD Assessment)  Moderate  Hair  Reviewed  Eyes  Reviewed  Mouth  Reviewed  Skin  Reviewed  Nails  Reviewed       Diet Order:   Diet Order            Diet heart healthy/carb modified Room service appropriate? Yes; Fluid consistency: Thin; Fluid restriction: 2000 mL Fluid  Diet effective now              EDUCATION NEEDS:   Education needs have been addressed  Skin:  Skin Assessment: Reviewed RN Assessment  Last BM:  PTA  Height:   Ht Readings from Last 1 Encounters:  03/24/18 6' (1.829 m)    Weight:   Wt Readings from Last 1 Encounters:  03/25/18 (!) 143.8 kg  Ideal Body Weight:  80.9 kg  BMI:  Body mass index is 42.99 kg/m.  Estimated Nutritional Needs:   Kcal:  2000-2200  Protein:  105-120 grams  Fluid:  2.2-.2.4 L    Syeda Prickett A. Mayford Knife, RD, LDN, CDCES Registered Dietitian II Certified Diabetes Care and Education Specialist Pager: [phone] After hours Pager: [phone]

## 2018-03-25 NOTE — Progress Notes (Signed)
Orthopedic Tech Progress Note Patient Details:  Katherine [name] 02-08-1960 [id number]  Ortho Devices Type of Ortho Device: Radio broadcast assistant Ortho Device/Splint Location: bil Ortho Device/Splint Interventions: Application, Ordered   Post Interventions Patient Tolerated: Well Instructions Provided: Care of device, Adjustment of device   Donald Pore 03/25/2018, 8:51 AM

## 2018-03-26 ENCOUNTER — Other Ambulatory Visit: Payer: Self-pay

## 2018-03-26 ENCOUNTER — Encounter (HOSPITAL_COMMUNITY): Payer: Self-pay | Admitting: *Deleted

## 2018-03-26 LAB — CBC WITH DIFFERENTIAL/PLATELET
Abs Immature Granulocytes: 0.03 10*3/uL (ref 0.00–0.07)
Basophils Absolute: 0 10*3/uL (ref 0.0–0.1)
Basophils Relative: 0 %
Eosinophils Absolute: 0.1 10*3/uL (ref 0.0–0.5)
Eosinophils Relative: 2 %
HCT: 33.6 % — ABNORMAL LOW (ref 36.0–46.0)
Hemoglobin: 10.4 g/dL — ABNORMAL LOW (ref 12.0–15.0)
Immature Granulocytes: 1 %
Lymphocytes Relative: 23 %
Lymphs Abs: 1.4 10*3/uL (ref 0.7–4.0)
MCH: 30.1 pg (ref 26.0–34.0)
MCHC: 31 g/dL (ref 30.0–36.0)
MCV: 97.1 fL (ref 80.0–100.0)
Monocytes Absolute: 0.6 10*3/uL (ref 0.1–1.0)
Monocytes Relative: 9 %
NEUTROS PCT: 65 %
Neutro Abs: 4.2 10*3/uL (ref 1.7–7.7)
Platelets: 210 10*3/uL (ref 150–400)
RBC: 3.46 MIL/uL — ABNORMAL LOW (ref 3.87–5.11)
RDW: 21.8 % — AB (ref 11.5–15.5)
WBC: 6.3 10*3/uL (ref 4.0–10.5)
nRBC: 0.6 % — ABNORMAL HIGH (ref 0.0–0.2)

## 2018-03-26 LAB — BASIC METABOLIC PANEL
ANION GAP: 10 (ref 5–15)
Anion gap: 12 (ref 5–15)
BUN: 38 mg/dL — ABNORMAL HIGH (ref 6–20)
BUN: 36 mg/dL — ABNORMAL HIGH (ref 6–20)
CALCIUM: 8.7 mg/dL — AB (ref 8.9–10.3)
CO2: 25 mmol/L (ref 22–32)
CO2: 27 mmol/L (ref 22–32)
Calcium: 8.9 mg/dL (ref 8.9–10.3)
Chloride: 98 mmol/L (ref 98–111)
Chloride: 99 mmol/L (ref 98–111)
Creatinine, Ser: 2.8 mg/dL — ABNORMAL HIGH (ref 0.44–1.00)
Creatinine, Ser: 2.6 mg/dL — ABNORMAL HIGH (ref 0.44–1.00)
GFR calc Af Amer: 21 mL/min — ABNORMAL LOW (ref >60)
GFR calc Af Amer: 23 mL/min — ABNORMAL LOW (ref >60)
GFR calc non Af Amer: 18 mL/min — ABNORMAL LOW (ref >60)
GFR, EST NON AFRICAN AMERICAN: 20 mL/min — AB (ref >60)
Glucose, Bld: 129 mg/dL — ABNORMAL HIGH (ref 70–99)
Glucose, Bld: 165 mg/dL — ABNORMAL HIGH (ref 70–99)
Potassium: 5.1 mmol/L (ref 3.5–5.1)
Potassium: 4.2 mmol/L (ref 3.5–5.1)
Sodium: 135 mmol/L (ref 135–145)
Sodium: 136 mmol/L (ref 135–145)

## 2018-03-26 LAB — URINE CULTURE: Culture: NO GROWTH

## 2018-03-26 LAB — COOXEMETRY PANEL
Carboxyhemoglobin: 1.5 % (ref 0.5–1.5)
METHEMOGLOBIN: 1.2 % (ref 0.0–1.5)
O2 Saturation: 59.4 %
Total hemoglobin: 10.9 g/dL — ABNORMAL LOW (ref 12.0–16.0)

## 2018-03-26 LAB — GLUCOSE, CAPILLARY
Glucose-Capillary: 129 mg/dL — ABNORMAL HIGH (ref 70–99)
Glucose-Capillary: 106 mg/dL — ABNORMAL HIGH (ref 70–99)
Glucose-Capillary: 159 mg/dL — ABNORMAL HIGH (ref 70–99)
Glucose-Capillary: 124 mg/dL — ABNORMAL HIGH (ref 70–99)

## 2018-03-26 MED ORDER — DIPHENHYDRAMINE HCL 50 MG/ML IJ SOLN
12.5000 mg | Freq: Once | INTRAMUSCULAR | Status: AC
  Administered 2018-03-26: 12.5 mg via INTRAVENOUS

## 2018-03-26 MED ORDER — METOLAZONE 2.5 MG PO TABS
2.5000 mg | ORAL_TABLET | Freq: Once | ORAL | Status: AC
  Administered 2018-03-26: 2.5 mg via ORAL
  Filled 2018-03-26: qty 1

## 2018-03-26 NOTE — Progress Notes (Signed)
CVP readings  3/6 2000 24 3/7 0000 21 3/7 0400 16

## 2018-03-26 NOTE — Plan of Care (Signed)

## 2018-03-26 NOTE — Progress Notes (Signed)
Patient ID: Katherine [name], female   DOB: [birth year], 58 y.o.   MRN: [id number]     Advanced Heart Failure Rounding Note  PCP-Cardiologist: Lewayne Bunting, MD   Subjective:    Creatinine 2.7 => 3.17 => 3.07 => 2.8.  Initial co-ox 38%.  She was started on dobutamine at 4, co-ox pending this morning.  Very good diuresis yesterday, CVP 20 today.   SBP 100s, stable.   Breathing is getting better.    Objective:   Weight Range: (!) 143.8 kg Body mass index is 42.99 kg/m.   Vital Signs:   Temp:  [97.6 F (36.4 C)-98.2 F (36.8 C)] 98.2 F (36.8 C) (03/07 0736) Pulse Rate:  [61-68] 66 (03/07 0736) Resp:  [0-20] 15 (03/07 0736) BP: (97-116)/(59-75) 116/72 (03/07 0736) SpO2:  [95 %-98 %] 95 % (03/07 0736) Arterial Line BP: (81-109)/(57-60) 81/60 (03/06 2300)    Weight change: Filed Weights   03/24/18 1737 03/25/18 0359  Weight: 134.7 kg (!) 143.8 kg    Intake/Output:   Intake/Output Summary (Last 24 hours) at 03/26/2018 0753 Last data filed at 03/26/2018 0742 Gross per 24 hour  Intake 405.6 ml  Output 6600 ml  Net -6194.4 ml      Physical Exam    General: NAD Neck: JVP 16+, no thyromegaly or thyroid nodule.  Lungs: Clear to auscultation bilaterally with normal respiratory effort. CV: Nonpalpable PMI.  Heart regular S1/S2, no S3/S4, no murmur.  2+ edema to thighs.   Abdomen: Soft, nontender, no hepatosplenomegaly, no distention.  Skin: Intact without lesions or rashes.  Neurologic: Alert and oriented x 3.  Psych: Normal affect. Extremities: No clubbing or cyanosis.  HEENT: Normal.    Telemetry   NSR 60s with PVCs, no VT (personally reviewed).   Labs    CBC Recent Labs    03/25/18 0610 03/26/18 0500  WBC 7.7 6.3  NEUTROABS 5.1 4.2  HGB 11.2* 10.4*  HCT 34.1* 33.6*  MCV 93.7 97.1  PLT 242 210   Basic Metabolic Panel Recent Labs    89/38/10 1648 03/26/18 0500  NA 136 135  K 4.7 5.1  CL 104 98  CO2 24 25  GLUCOSE 101* 129*  BUN 39* 38*    CREATININE 2.99* 2.80*  CALCIUM 8.9 8.7*   Liver Function Tests Recent Labs    03/25/18 0030  AST 43*  ALT 26  ALKPHOS 131*  BILITOT 2.6*  PROT 6.1*  ALBUMIN 2.6*   No results for input(s): LIPASE, AMYLASE in the last 72 hours. Cardiac Enzymes No results for input(s): CKTOTAL, CKMB, CKMBINDEX, TROPONINI in the last 72 hours.  BNP: BNP (last 3 results) Recent Labs    01/12/18 0410 03/19/18 0702 03/24/18 1752  BNP 2,922.0* >4,500.0* 893.2*    ProBNP (last 3 results) No results for input(s): PROBNP in the last 8760 hours.   D-Dimer No results for input(s): DDIMER in the last 72 hours. Hemoglobin A1C No results for input(s): HGBA1C in the last 72 hours. Fasting Lipid Panel No results for input(s): CHOL, HDL, LDLCALC, TRIG, CHOLHDL, LDLDIRECT in the last 72 hours. Thyroid Function Tests No results for input(s): TSH, T4TOTAL, T3FREE, THYROIDAB in the last 72 hours.  Invalid input(s): FREET3  Other results:   Imaging    US Renal  Result Date: 03/25/2018 CLINICAL DATA:  Acute kidney injury. EXAM: RENAL / URINARY TRACT ULTRASOUND COMPLETE COMPARISON:  Limited right upper quadrant abdomen ultrasound dated 03/19/2018. FINDINGS: Right Kidney: Renal measurements: 11.3 x 5.8 x 5.8 cm =  volume: 198 mL . Echogenicity within normal limits. No mass or hydronephrosis visualized. Left Kidney: Renal measurements: 11.2 x 6.3 x 5.8 cm = volume: 215 mL. Echogenicity within normal limits. No mass or hydronephrosis visualized. Bladder: Not distended with a Foley catheter in place. Other: Large number of gallstones again demonstrated in the gallbladder, measuring up to 9 mm in diameter each. No gallbladder wall thickening or pericholecystic fluid seen. There is also a small right pleural effusion. IMPRESSION: 1. Normal appearing kidneys. 2. Cholelithiasis. 3. Small right pleural effusion. Electronically Signed   By: Beckie Salts M.D.   On: 03/25/2018 16:09     Medications:      Scheduled Medications: . amiodarone  200 mg Oral Q6H  . ciprofloxacin  250 mg Oral BID  . clopidogrel  75 mg Oral Daily  . feeding supplement (GLUCERNA SHAKE)  237 mL Oral TID BM  . heparin  5,000 Units Subcutaneous Q8H  . hydroxychloroquine  200 mg Oral BID  . insulin aspart  0-5 Units Subcutaneous QHS  . insulin aspart  0-9 Units Subcutaneous TID WC  . loratadine  10 mg Oral Daily  . metolazone  2.5 mg Oral Once  . mometasone-formoterol  2 puff Inhalation BID  . multivitamin with minerals  1 tablet Oral Daily  . ranolazine  500 mg Oral BID  . sodium chloride flush  10-40 mL Intracatheter Q12H  . venlafaxine XR  150 mg Oral Q breakfast    Infusions: . sodium chloride    . sodium chloride    . DOBUTamine 4 mcg/kg/min (03/26/18 0458)  . furosemide (LASIX) infusion 15 mg/hr (03/26/18 0000)    PRN Medications: sodium chloride, Place/Maintain arterial line **AND** sodium chloride, acetaminophen, diphenhydrAMINE, oxyCODONE-acetaminophen, prochlorperazine, sodium chloride flush     Assessment/Plan   1. Acute on chronic systolic CHF: Nonischemic cardiomyopathy.  Echo in 12/19 with EF 20-25%, mildly decreased RV systolic function.  Medtronic ICD.  She had been at [facility] with CHF exacerbation and VT, creatinine rose considerably with attempts at diuresis.  Lactate was elevated. Initial co-ox 38%.  SBP was low at times in the 80s-90s.  Dobutamine was started empirically at 4 mcg/kg/min, BP now stable in 100s on arterial line. Pending co-ox today, good diuresis and CVP down to 20.  On exam, she remains volume overloaded but improving.  She appears to have a long way to go with diuresis. - Continue dobutamine 4 mcg/kg/min.  - Continue Lasix gtt at 15 mg/hr and will add metolazone 2.5 mg x 1 again today.  - Unna boots 2. AKI: Creatinine up to peak of 3.17 from 1.11.  Suspect cardiorenal in setting of low cardiac output. She is markedly volume overloaded.  Creatinine down to 2.8 today on  dobutamine.  Renal US normal.  - Hopefully, improving CO with dobutamine will continue to help creatinine.  3. VT: Patient had 17 episodes of VT with ICD discharges since 1/20.  It is possible that CHF was trigger of VT, she is markedly volume overloaded.  No VT overnight, occasional PVCs.  - Currently getting amiodarone 200 mg every 6 hrs per EP.  - She is on ranolazine.  - Beta blockers on hold with suspected low output.  4. H/o CVA: On Plavix.  5. H/o SLE: On hydroxychloroquine.  6. UTI: Cipro.   Mobilize, out of bed with PT.   Length of Stay: 2  Marca Ancona, MD  03/26/2018, 7:53 AM  Advanced Heart Failure Team Pager [phone] (M-F; 7a - 4p)  Please contact [facility] for night-coverage after hours (4p -7a ) and weekends on [email or web address]

## 2018-03-26 NOTE — Evaluation (Signed)
Physical Therapy Evaluation Patient Details Name: Katherine [name] MRN: [id number] DOB: 03-03-1960 Today's Date: 03/26/2018   History of Present Illness  Pt is a 58 y.o. female admitted to [facility] on 03/19/18 with SOB and weakness; worked up for CHF and hypokalemia. Transfer to [facility] 3/5 for further medical management. PMH includes CVA with residual L sided weakness, CHF, CKD, fibromyalgia, h/o seizures, htn, DM, ICD with generator replacement, lupus.    Clinical Impression  Pt presents with an overall decrease in functional mobility secondary to above. PTA, pt uses RW and wheelchair for household mobility; family assists PRN with mobility and ADLs. Today, pt required min-modA to stand and take steps to recliner. Limited by fatigue, weakness and decreased activity tolerance. Pt would benefit from continued acute PT services to maximize functional mobility and independence prior to d/c with SNF-level therapies.     Follow Up Recommendations SNF;Supervision for mobility/OOB    Equipment Recommendations  (TBD)    Recommendations for Other Services       Precautions / Restrictions Precautions Precautions: Fall Restrictions Weight Bearing Restrictions: No      Mobility  Bed Mobility Overal bed mobility: Needs Assistance Bed Mobility: Supine to Sit     Supine to sit: Mod assist;HOB elevated     General bed mobility comments: ModA to scoot hips to EOB with HHA  Transfers Overall transfer level: Needs assistance Equipment used: 1 person hand held assist Transfers: Sit to/from Stand Sit to Stand: Mod assist;Max assist         General transfer comment: Pt able to stand from EOB once bariatric bed inflated, modA for HHA to assist trunk elevation; maxA for eccentric control to sit in recliner  Ambulation/Gait Ambulation/Gait assistance: Mod assist Gait Distance (Feet): 1 Feet Assistive device: 1 person hand held assist Gait Pattern/deviations: Step-to pattern;Wide base of  support Gait velocity: Decreased   General Gait Details: Took pivotal steps from bed to recliner with HHA and modA to maintain balance  Stairs            Wheelchair Mobility    Modified Rankin (Stroke Patients Only)       Balance Overall balance assessment: Needs assistance Sitting-balance support: No upper extremity supported;Feet supported Sitting balance-Leahy Scale: Fair       Standing balance-Leahy Scale: Poor Standing balance comment: Reliant on UE support                             Pertinent Vitals/Pain Pain Assessment: No/denies pain    Home Living Family/patient expects to be discharged to:: Private residence Living Arrangements: Alone Available Help at Discharge: Friend(s);Family;Available PRN/intermittently Type of Home: House Home Access: Stairs to enter Entrance Stairs-Rails: Right Entrance Stairs-Number of Steps: 4(pt thinks someone is building a ramp) Home Layout: One level Home Equipment: Toilet riser;Wheelchair - [employer] - 4 wheels;Walker - 2 wheels;Grab bars - tub/shower      Prior Function Level of Independence: Needs assistance   Gait / Transfers Assistance Needed: Pt reports ambulatory with RW at home; sometimes needs help from family to get out of bed and to/from wheelchair. Reports increased difficulty indep pushing manual w/c, wants an Art gallery manager  ADL's / Homemaking Assistance Needed: PRN assist from family for ADLs  Comments: Reports 2 falls in past 6 months     Hand Dominance        Extremity/Trunk Assessment   Upper Extremity Assessment Upper Extremity Assessment: Overall WFL for tasks assessed  Lower Extremity Assessment Lower Extremity Assessment: Generalized weakness       Communication   Communication: No difficulties  Cognition Arousal/Alertness: Awake/alert Behavior During Therapy: WFL for tasks assessed/performed Overall Cognitive Status: Within Functional Limits for tasks assessed                                         General Comments General comments (skin integrity, edema, etc.): VSS    Exercises     Assessment/Plan    PT Assessment Patient needs continued PT services  PT Problem List Decreased strength;Decreased activity tolerance;Decreased balance;Decreased mobility;Decreased knowledge of use of DME;Decreased knowledge of precautions;Cardiopulmonary status limiting activity;Pain       PT Treatment Interventions DME instruction;Gait training;Stair training;Functional mobility training;Therapeutic activities;Therapeutic exercise;Balance training;Patient/family education    PT Goals (Current goals can be found in the Care Plan section)  Acute Rehab PT Goals Patient Stated Goal: Agreeable to rehab at SNF PT Goal Formulation: With patient Time For Goal Achievement: 04/09/18 Potential to Achieve Goals: Good    Frequency Min 2X/week   Barriers to discharge Decreased caregiver support      Co-evaluation               AM-PAC PT "6 Clicks" Mobility  Outcome Measure Help needed turning from your back to your side while in a flat bed without using bedrails?: A Little Help needed moving from lying on your back to sitting on the side of a flat bed without using bedrails?: A Lot Help needed moving to and from a bed to a chair (including a wheelchair)?: A Lot Help needed standing up from a chair using your arms (e.g., wheelchair or bedside chair)?: A Lot Help needed to walk in hospital room?: A Lot Help needed climbing 3-5 steps with a railing? : Total 6 Click Score: 12    End of Session   Activity Tolerance: Patient tolerated treatment well Patient left: in chair;with call bell/phone within reach Nurse Communication: Mobility status PT Visit Diagnosis: Other abnormalities of gait and mobility (R26.89);Muscle weakness (generalized) (M62.81);Difficulty in walking, not elsewhere classified (R26.2)    Time: 7124-5809 PT Time  Calculation (min) (ACUTE ONLY): 28 min   Charges:   PT Evaluation $PT Eval Moderate Complexity: 1 Mod PT Treatments $Therapeutic Activity: 8-22 mins      Ina Homes, PT, DPT Acute Rehabilitation Services  Pager [phone] Office [phone]  Malachy Chamber 03/26/2018, 3:53 PM

## 2018-03-27 LAB — COOXEMETRY PANEL
CARBOXYHEMOGLOBIN: 1.7 % — AB (ref 0.5–1.5)
Methemoglobin: 1 % (ref 0.0–1.5)
O2 Saturation: 57.2 %
Total hemoglobin: 11.4 g/dL — ABNORMAL LOW (ref 12.0–16.0)

## 2018-03-27 LAB — CBC WITH DIFFERENTIAL/PLATELET
Abs Immature Granulocytes: 0.02 10*3/uL (ref 0.00–0.07)
Basophils Absolute: 0 10*3/uL (ref 0.0–0.1)
Basophils Relative: 0 %
EOS ABS: 0.2 10*3/uL (ref 0.0–0.5)
Eosinophils Relative: 3 %
HCT: 33.7 % — ABNORMAL LOW (ref 36.0–46.0)
Hemoglobin: 10.8 g/dL — ABNORMAL LOW (ref 12.0–15.0)
Immature Granulocytes: 0 %
Lymphocytes Relative: 28 %
Lymphs Abs: 1.7 10*3/uL (ref 0.7–4.0)
MCH: 30 pg (ref 26.0–34.0)
MCHC: 32 g/dL (ref 30.0–36.0)
MCV: 93.6 fL (ref 80.0–100.0)
MONOS PCT: 8 %
Monocytes Absolute: 0.5 10*3/uL (ref 0.1–1.0)
Neutro Abs: 3.7 10*3/uL (ref 1.7–7.7)
Neutrophils Relative %: 61 %
Platelets: 248 10*3/uL (ref 150–400)
RBC: 3.6 MIL/uL — ABNORMAL LOW (ref 3.87–5.11)
RDW: 19.3 % — ABNORMAL HIGH (ref 11.5–15.5)
WBC: 6.1 10*3/uL (ref 4.0–10.5)
nRBC: 0.3 % — ABNORMAL HIGH (ref 0.0–0.2)

## 2018-03-27 LAB — GLUCOSE, CAPILLARY
Glucose-Capillary: 101 mg/dL — ABNORMAL HIGH (ref 70–99)
Glucose-Capillary: 172 mg/dL — ABNORMAL HIGH (ref 70–99)
Glucose-Capillary: 131 mg/dL — ABNORMAL HIGH (ref 70–99)
Glucose-Capillary: 120 mg/dL — ABNORMAL HIGH (ref 70–99)

## 2018-03-27 LAB — BASIC METABOLIC PANEL
Anion gap: 10 (ref 5–15)
BUN: 33 mg/dL — ABNORMAL HIGH (ref 6–20)
CHLORIDE: 98 mmol/L (ref 98–111)
CO2: 29 mmol/L (ref 22–32)
Calcium: 8.9 mg/dL (ref 8.9–10.3)
Creatinine, Ser: 2.48 mg/dL — ABNORMAL HIGH (ref 0.44–1.00)
GFR calc Af Amer: 24 mL/min — ABNORMAL LOW (ref >60)
GFR calc non Af Amer: 21 mL/min — ABNORMAL LOW (ref >60)
GLUCOSE: 103 mg/dL — AB (ref 70–99)
Potassium: 4 mmol/L (ref 3.5–5.1)
Sodium: 137 mmol/L (ref 135–145)

## 2018-03-27 LAB — MAGNESIUM: Magnesium: 1.8 mg/dL (ref 1.7–2.4)

## 2018-03-27 LAB — MRSA PCR SCREENING: MRSA by PCR: NEGATIVE

## 2018-03-27 MED ORDER — METOLAZONE 2.5 MG PO TABS
2.5000 mg | ORAL_TABLET | Freq: Once | ORAL | Status: AC
  Administered 2018-03-27: 2.5 mg via ORAL
  Filled 2018-03-27: qty 1

## 2018-03-27 MED ORDER — MAGNESIUM SULFATE 2 GM/50ML IV SOLN
2.0000 g | Freq: Once | INTRAVENOUS | Status: AC
  Administered 2018-03-27: 2 g via INTRAVENOUS
  Filled 2018-03-27: qty 50

## 2018-03-27 MED ORDER — POTASSIUM CHLORIDE CRYS ER 20 MEQ PO TBCR
40.0000 meq | EXTENDED_RELEASE_TABLET | Freq: Once | ORAL | Status: AC
  Administered 2018-03-27: 40 meq via ORAL
  Filled 2018-03-27: qty 2

## 2018-03-27 NOTE — Progress Notes (Signed)
Patient ID: Katherine [name], female   DOB: 03-Mar-1960, 58 y.o.   MRN: [id number]     Advanced Heart Failure Rounding Note  PCP-Cardiologist: Lewayne Bunting, MD   Subjective:    Creatinine 2.7 => 3.17 => 3.07 => 2.8 => 2.48.  Initial co-ox 38%.  She was started on dobutamine at 4, co-ox 57% this morning.  Very good diuresis again yesterday, CVP 20 today.   SBP 1020s, stable.   Breathing is getting better.    Objective:   Weight Range: (!) 138.3 kg Body mass index is 41.37 kg/m.   Vital Signs:   Temp:  [98 F (36.7 C)-98.4 F (36.9 C)] 98 F (36.7 C) (03/08 0739) Pulse Rate:  [60-67] 66 (03/08 0804) Resp:  [10-20] 10 (03/08 0804) BP: (98-125)/(57-90) 123/90 (03/08 0739) SpO2:  [96 %-99 %] 97 % (03/08 0804) Arterial Line BP: (81-125)/(44-106) 125/74 (03/08 0804) Weight:  [138.3 kg] 138.3 kg (03/08 0343)    Weight change: Filed Weights   03/24/18 1737 03/25/18 0359 03/27/18 0343  Weight: 134.7 kg (!) 143.8 kg (!) 138.3 kg    Intake/Output:   Intake/Output Summary (Last 24 hours) at 03/27/2018 0821 Last data filed at 03/27/2018 0740 Gross per 24 hour  Intake 1639.1 ml  Output 8125 ml  Net -6485.9 ml      Physical Exam    General: NAD Neck: JVP 16 cm, no thyromegaly or thyroid nodule.  Lungs: Clear to auscultation bilaterally with normal respiratory effort. CV: Nondisplaced PMI.  Heart regular S1/S2, no S3/S4, no murmur.  2+ edema to knees bilaterally.   Abdomen: Soft, nontender, no hepatosplenomegaly, no distention.  Skin: Intact without lesions or rashes.  Neurologic: Alert and oriented x 3.  Psych: Normal affect. Extremities: No clubbing or cyanosis.  HEENT: Normal.    Telemetry   NSR 60s with PVCs, no VT (personally reviewed).   Labs    CBC Recent Labs    03/26/18 0500 03/27/18 0420  WBC 6.3 6.1  NEUTROABS 4.2 3.7  HGB 10.4* 10.8*  HCT 33.6* 33.7*  MCV 97.1 93.6  PLT 210 248   Basic Metabolic Panel Recent Labs    54/65/68 1558  03/27/18 0420  NA 136 137  K 4.2 4.0  CL 99 98  CO2 27 29  GLUCOSE 165* 103*  BUN 36* 33*  CREATININE 2.60* 2.48*  CALCIUM 8.9 8.9  MG  --  1.8   Liver Function Tests Recent Labs    03/25/18 0030  AST 43*  ALT 26  ALKPHOS 131*  BILITOT 2.6*  PROT 6.1*  ALBUMIN 2.6*   No results for input(s): LIPASE, AMYLASE in the last 72 hours. Cardiac Enzymes No results for input(s): CKTOTAL, CKMB, CKMBINDEX, TROPONINI in the last 72 hours.  BNP: BNP (last 3 results) Recent Labs    01/12/18 0410 03/19/18 0702 03/24/18 1752  BNP 2,922.0* >4,500.0* 893.2*    ProBNP (last 3 results) No results for input(s): PROBNP in the last 8760 hours.   D-Dimer No results for input(s): DDIMER in the last 72 hours. Hemoglobin A1C No results for input(s): HGBA1C in the last 72 hours. Fasting Lipid Panel No results for input(s): CHOL, HDL, LDLCALC, TRIG, CHOLHDL, LDLDIRECT in the last 72 hours. Thyroid Function Tests No results for input(s): TSH, T4TOTAL, T3FREE, THYROIDAB in the last 72 hours.  Invalid input(s): FREET3  Other results:   Imaging    No results found.   Medications:     Scheduled Medications: . amiodarone  200 mg Oral  Q6H  . ciprofloxacin  250 mg Oral BID  . clopidogrel  75 mg Oral Daily  . feeding supplement (GLUCERNA SHAKE)  237 mL Oral TID BM  . heparin  5,000 Units Subcutaneous Q8H  . hydroxychloroquine  200 mg Oral BID  . insulin aspart  0-5 Units Subcutaneous QHS  . insulin aspart  0-9 Units Subcutaneous TID WC  . loratadine  10 mg Oral Daily  . mometasone-formoterol  2 puff Inhalation BID  . multivitamin with minerals  1 tablet Oral Daily  . ranolazine  500 mg Oral BID  . sodium chloride flush  10-40 mL Intracatheter Q12H  . venlafaxine XR  150 mg Oral Q breakfast    Infusions: . sodium chloride    . sodium chloride    . DOBUTamine 4 mcg/kg/min (03/27/18 0600)  . furosemide (LASIX) infusion 15 mg/hr (03/27/18 0600)  . magnesium sulfate 1 - 4 g  bolus IVPB      PRN Medications: sodium chloride, Place/Maintain arterial line **AND** sodium chloride, acetaminophen, diphenhydrAMINE, oxyCODONE-acetaminophen, prochlorperazine, sodium chloride flush     Assessment/Plan   1. Acute on chronic systolic CHF: Nonischemic cardiomyopathy.  Echo in 12/19 with EF 20-25%, mildly decreased RV systolic function.  Medtronic ICD.  She had been at [facility] with CHF exacerbation and VT, creatinine rose considerably with attempts at diuresis.  Lactate was elevated. Initial co-ox 38%.  SBP was low at times in the 80s-90s.  Dobutamine was started empirically at 4 mcg/kg/min, BP now stable in 120s. Co-ox 57% today, good diuresis and CVP still 20.  On exam, she remains volume overloaded but improving.  She appears to have a long way to go with diuresis. - Continue dobutamine 4 mcg/kg/min.  - Continue Lasix gtt at 15 mg/hr and will give metolazone 2.5 mg x 1 again today.  - Unna boots 2. AKI: Creatinine up to peak of 3.17 from 1.11.  Suspect cardiorenal in setting of low cardiac output. She is markedly volume overloaded.  Creatinine down to 2.48 today on dobutamine.  Renal US normal.  - Hopefully, improving CO with dobutamine will continue to help creatinine.  3. VT: Patient had 17 episodes of VT with ICD discharges since 1/20.  It is possible that CHF was trigger of VT, she is markedly volume overloaded.  No VT overnight, occasional PVCs.  - Currently getting amiodarone 200 mg every 6 hrs per EP.  - She is on ranolazine.  - Beta blockers on hold with suspected low output.  4. H/o CVA: On Plavix.  5. H/o SLE: On hydroxychloroquine.  6. UTI: Cipro.  7. Disposition: Will need SNF eventually, not very mobile.   Mobilize, out of bed with PT.   Length of Stay: 3  Marca Ancona, MD  03/27/2018, 8:21 AM  Advanced Heart Failure Team Pager [phone] (M-F; 7a - 4p)  Please contact [facility] for night-coverage after hours (4p -7a ) and weekends on [email or web address]

## 2018-03-27 NOTE — Plan of Care (Signed)

## 2018-03-28 LAB — COOXEMETRY PANEL
Carboxyhemoglobin: 1.4 % (ref 0.5–1.5)
Methemoglobin: 1.3 % (ref 0.0–1.5)
O2 Saturation: 57.6 %
Total hemoglobin: 14.2 g/dL (ref 12.0–16.0)

## 2018-03-28 LAB — CBC WITH DIFFERENTIAL/PLATELET
Abs Immature Granulocytes: 0.03 10*3/uL (ref 0.00–0.07)
Basophils Absolute: 0 10*3/uL (ref 0.0–0.1)
Basophils Relative: 0 %
Eosinophils Absolute: 0.1 10*3/uL (ref 0.0–0.5)
Eosinophils Relative: 2 %
HCT: 34.3 % — ABNORMAL LOW (ref 36.0–46.0)
HEMOGLOBIN: 11.2 g/dL — AB (ref 12.0–15.0)
Immature Granulocytes: 0 %
Lymphocytes Relative: 21 %
Lymphs Abs: 1.4 10*3/uL (ref 0.7–4.0)
MCH: 30.4 pg (ref 26.0–34.0)
MCHC: 32.7 g/dL (ref 30.0–36.0)
MCV: 93 fL (ref 80.0–100.0)
Monocytes Absolute: 0.6 10*3/uL (ref 0.1–1.0)
Monocytes Relative: 9 %
Neutro Abs: 4.6 10*3/uL (ref 1.7–7.7)
Neutrophils Relative %: 68 %
Platelets: 244 10*3/uL (ref 150–400)
RBC: 3.69 MIL/uL — AB (ref 3.87–5.11)
RDW: 19.4 % — ABNORMAL HIGH (ref 11.5–15.5)
WBC: 6.8 10*3/uL (ref 4.0–10.5)
nRBC: 0 % (ref 0.0–0.2)

## 2018-03-28 LAB — GLUCOSE, CAPILLARY
Glucose-Capillary: 88 mg/dL (ref 70–99)
Glucose-Capillary: 137 mg/dL — ABNORMAL HIGH (ref 70–99)
Glucose-Capillary: 134 mg/dL — ABNORMAL HIGH (ref 70–99)
Glucose-Capillary: 133 mg/dL — ABNORMAL HIGH (ref 70–99)

## 2018-03-28 LAB — BASIC METABOLIC PANEL
Anion gap: 11 (ref 5–15)
BUN: 27 mg/dL — ABNORMAL HIGH (ref 6–20)
CHLORIDE: 88 mmol/L — AB (ref 98–111)
CO2: 29 mmol/L (ref 22–32)
Calcium: 8.4 mg/dL — ABNORMAL LOW (ref 8.9–10.3)
Creatinine, Ser: 2.09 mg/dL — ABNORMAL HIGH (ref 0.44–1.00)
GFR calc Af Amer: 30 mL/min — ABNORMAL LOW (ref >60)
GFR calc non Af Amer: 26 mL/min — ABNORMAL LOW (ref >60)
Glucose, Bld: 309 mg/dL — ABNORMAL HIGH (ref 70–99)
Potassium: 3.8 mmol/L (ref 3.5–5.1)
Sodium: 128 mmol/L — ABNORMAL LOW (ref 135–145)

## 2018-03-28 LAB — MAGNESIUM: Magnesium: 2 mg/dL (ref 1.7–2.4)

## 2018-03-28 MED ORDER — METOLAZONE 2.5 MG PO TABS
2.5000 mg | ORAL_TABLET | Freq: Once | ORAL | Status: AC
  Administered 2018-03-28: 2.5 mg via ORAL
  Filled 2018-03-28: qty 1

## 2018-03-28 MED ORDER — POTASSIUM CHLORIDE CRYS ER 20 MEQ PO TBCR
40.0000 meq | EXTENDED_RELEASE_TABLET | Freq: Once | ORAL | Status: AC
  Administered 2018-03-28: 40 meq via ORAL
  Filled 2018-03-28: qty 2

## 2018-03-28 NOTE — Evaluation (Addendum)
Occupational Therapy Evaluation Patient Details Name: Katherine [name] MRN: [id number] DOB: 10/11/1960 Today's Date: 03/28/2018    History of Present Illness Pt is a 58 y.o. female admitted to [facility] on 03/19/18 with SOB and weakness; worked up for CHF and hypokalemia. Transfer to [facility] 3/5 for further medical management. PMH includes CVA with residual L sided weakness, CHF, CKD, fibromyalgia, h/o seizures, htn, DM, ICD with generator replacement, lupus.   Clinical Impression   This 58 y/o female presents with the above. At baseline pt reports she is mod independent with functional mobility using RW and intermittent use of wheelchair. Reports she receives supervision for shower transfers and intermittent assist for ADL. Pt presenting with increased pain, weakness, decreased standing balance impacting her functional performance. Pt requiring modA+2 for stand pivot transfers, requiring increased cues for safety; currently requires minA for UB ADL, maxA for LB ADL. She will benefit from continued acute OT services and recommend follow up therapy services in SNF setting after discharge to maximize her safety and independence with ADL and mobility. Will follow.     Follow Up Recommendations  SNF;Supervision/Assistance - 24 hour    Equipment Recommendations  Other (comment)(TBD in next venue)           Precautions / Restrictions Precautions Precautions: Fall Restrictions Weight Bearing Restrictions: No      Mobility Bed Mobility Overal bed mobility: Needs Assistance Bed Mobility: Supine to Sit     Supine to sit: +2 for safety/equipment;Max assist     General bed mobility comments: assist to initiate moving LEs over EOB and to elevate trunk  Transfers Overall transfer level: Needs assistance Equipment used: 2 person hand held assist Transfers: Sit to/from [employer] Transfers Sit to Stand: Mod assist;+2 safety/equipment Stand pivot transfers: Mod assist;+2 physical  assistance;+2 safety/equipment       General transfer comment: requires boosting assist to rise and steady from EOB, increased cues for safe hand placement; use of two person face to face method to take pivotal steps to chair, increased cues for safety as pt initially attempting to push iv pole further away from her despite it being in appropriate location     Balance Overall balance assessment: Needs assistance Sitting-balance support: No upper extremity supported;Feet supported Sitting balance-Leahy Scale: Fair     Standing balance support: Single extremity supported;Bilateral upper extremity supported Standing balance-Leahy Scale: Poor Standing balance comment: reliant on external support                           ADL either performed or assessed with clinical judgement   ADL Overall ADL's : Needs assistance/impaired Eating/Feeding: Set up;Sitting   Grooming: Set up;Wash/dry face;Sitting   Upper Body Bathing: Minimal assistance;Sitting   Lower Body Bathing: Moderate assistance;+2 for physical assistance;+2 for safety/equipment;Sit to/from stand Lower Body Bathing Details (indicate cue type and reason): assist to cleanse buttocks in standing Upper Body Dressing : Minimal assistance;Sitting   Lower Body Dressing: Maximal assistance;+2 for physical assistance;+2 for safety/equipment;Sit to/from stand Lower Body Dressing Details (indicate cue type and reason): requires assist to don socks seated EOB Toilet Transfer: Moderate assistance;+2 for physical assistance;+2 for safety/equipment;Stand-pivot Toilet Transfer Details (indicate cue type and reason): simulated via transfer to recliner Toileting- Clothing Manipulation and Hygiene: Maximal assistance;+2 for physical assistance;+2 for safety/equipment;Sit to/from stand       Functional mobility during ADLs: Moderate assistance;+2 for physical assistance;+2 for safety/equipment(HHA, stand pivot transfer) General ADL  Comments: pt limited due  to pain, weakness, decreased endurance                         Pertinent Vitals/Pain Pain Assessment: Faces Faces Pain Scale: Hurts even more Pain Location: bil elbows, bil feet Pain Descriptors / Indicators: Discomfort;Sore Pain Intervention(s): Monitored during session;Repositioned;Premedicated before session     Hand Dominance     Extremity/Trunk Assessment Upper Extremity Assessment Upper Extremity Assessment: RUE deficits/detail;LUE deficits/detail RUE Deficits / Details: increased pain with elbow ROM requiring increased effort to perform, generalized weakness  LUE Deficits / Details: increased pain with elbow ROM requiring increased effort to perform, generalized weakness    Lower Extremity Assessment Lower Extremity Assessment: Defer to PT evaluation       Communication Communication Communication: No difficulties   Cognition Arousal/Alertness: Awake/alert Behavior During Therapy: WFL for tasks assessed/performed Overall Cognitive Status: No family/caregiver present to determine baseline cognitive functioning Area of Impairment: Attention;Memory;Following commands;Safety/judgement;Problem solving                   Current Attention Level: Sustained Memory: Decreased short-term memory Following Commands: Follows one step commands with increased time Safety/Judgement: Decreased awareness of safety;Decreased awareness of deficits   Problem Solving: Slow processing;Decreased initiation;Requires verbal cues;Requires tactile cues General Comments: pt with decreased safety awareness during mobility requiring increased cues to perform, STM deficits noted intermittently during session, pt asking what an MRI is at the start of tx session and reports she is going for an MRI, though when checked with RN pt without orders for MRI    General Comments  VSS throughout, pt with reports of dizziness with mobility stating likely due to recently  taking pain meds, BP taken and stable    Exercises     Shoulder Instructions      Home Living Family/patient expects to be discharged to:: Private residence Living Arrangements: Alone Available Help at Discharge: Friend(s);Family;Available PRN/intermittently Type of Home: House Home Access: Stairs to enter [employer] of Steps: 4(pt thinks someone is building a ramp) Entrance Stairs-Rails: Right Home Layout: One level     Bathroom Shower/Tub: Producer, television/film/video: Standard     Home Equipment: Toilet riser;Wheelchair - [employer] - 4 wheels;Walker - 2 wheels;Grab bars - tub/shower          Prior Functioning/Environment Level of Independence: Needs assistance  Gait / Transfers Assistance Needed: Pt reports ambulatory with RW at home; sometimes needs help from family to get out of bed and to/from wheelchair. Reports increased difficulty indep pushing manual w/c, wants an electric scooter ADL's / Homemaking Assistance Needed: PRN assist from family for ADLs; reports she had supervision for shower transfers from a friend   Comments: Reports 2 falls in past 6 months        OT Problem List: Pain;Decreased safety awareness;Decreased cognition;Impaired balance (sitting and/or standing);Decreased activity tolerance;Decreased strength;Decreased range of motion      OT Treatment/Interventions: Self-care/ADL training;Therapeutic exercise;Neuromuscular education;DME and/or AE instruction;Therapeutic activities;Patient/family education;Balance training;Cognitive remediation/compensation    OT Goals(Current goals can be found in the care plan section) Acute Rehab OT Goals Patient Stated Goal: to feel better OT Goal Formulation: With patient Time For Goal Achievement: 04/11/18 Potential to Achieve Goals: Good  OT Frequency: Min 2X/week   Barriers to D/C:            Co-evaluation              AM-PAC OT "6 Clicks" Daily Activity  Outcome  Measure Help from another person eating meals?: None Help from another person taking care of personal grooming?: A Little Help from another person toileting, which includes using toliet, bedpan, or urinal?: A Lot Help from another person bathing (including washing, rinsing, drying)?: A Lot Help from another person to put on and taking off regular upper body clothing?: A Little Help from another person to put on and taking off regular lower body clothing?: A Lot 6 Click Score: 16   End of Session Equipment Utilized During Treatment: Gait belt Nurse Communication: Mobility status(RN in rounds and w/o phone, talked to other RN on floor )  Activity Tolerance: Patient tolerated treatment well Patient left: in chair;with call bell/phone within reach;with chair alarm set  OT Visit Diagnosis: Unsteadiness on feet (R26.81);Muscle weakness (generalized) (M62.81)                Time: 5625-6389 OT Time Calculation (min): 29 min Charges:  OT General Charges $OT Visit: 1 Visit OT Evaluation $OT Eval Moderate Complexity: 1 Mod OT Treatments $Self Care/Home Management : 8-22 mins  Marcy Siren, OT Supplemental Rehabilitation Services Pager [phone] Office [phone]   Orlando Penner 03/28/2018, 12:57 PM

## 2018-03-28 NOTE — Progress Notes (Addendum)
Patient ID: Randa Spike, female   DOB: Jun 21, 1960, 58 y.o.   MRN: [id number]     Advanced Heart Failure Rounding Note  PCP-Cardiologist: Lewayne Bunting, MD   Subjective:    Creatinine 2.7 => 3.17 => 3.07 => 2.8 => 2.48 => 2.09.  Initial co-ox 38%. Remains on dobutamine at 4, co-ox 57.2%. CVP 17-18. Negative 9.5 L yesterday. Weight down 25 lbs if accurate.   SBPs 110-120s.   Awake and alert. Weighed in bed, but unable to stand so all weights have been bed weights. Denies SOB at rest. No further VT.   Objective:   Weight Range: 127 kg Body mass index is 37.97 kg/m.   Vital Signs:   Temp:  [98 F (36.7 C)-98.2 F (36.8 C)] 98.2 F (36.8 C) (03/09 0300) Pulse Rate:  [66-73] 72 (03/09 0300) Resp:  [10-22] 22 (03/09 0300) BP: (98-121)/(68-76) 121/69 (03/09 0300) SpO2:  [91 %-99 %] 93 % (03/09 0735) Arterial Line BP: (125)/(74) 125/74 (03/08 0804) Weight:  [768 kg] 127 kg (03/09 0300) Last BM Date: 03/25/18  Weight change: Filed Weights   03/25/18 0359 03/27/18 0343 03/28/18 0300  Weight: (!) 143.8 kg (!) 138.3 kg 127 kg   Intake/Output:   Intake/Output Summary (Last 24 hours) at 03/28/2018 0739 Last data filed at 03/28/2018 0643 Gross per 24 hour  Intake 2006.6 ml  Output 11572 ml  Net -9543.4 ml    Physical Exam    General:NAD  HEENT: Normal Neck: Supple. JVP difficut due to body habitus. Carotids 2+ bilat; no bruits. No thyromegaly or nodule noted. Cor: PMI nondisplaced. RRR, No M/G/R noted Lungs: CTAB, normal effort. Abdomen: Soft, non-tender, non-distended, no HSM. No bruits or masses. +BS  Extremities: No cyanosis, clubbing, or rash. 2-3+ edema into thighs.   Neuro: Alert & orientedx3, cranial nerves grossly intact. moves all 4 extremities w/o difficulty. Affect pleasant   Telemetry   NSR 70s with PVCs, no VT, personally reviewed.   Labs    CBC Recent Labs    03/27/18 0420 03/28/18 0320  WBC 6.1 6.8  NEUTROABS 3.7 4.6  HGB 10.8* 11.2*  HCT  33.7* 34.3*  MCV 93.6 93.0  PLT 248 244   Basic Metabolic Panel Recent Labs    62/03/55 0420 03/28/18 0320  NA 137 128*  K 4.0 3.8  CL 98 88*  CO2 29 29  GLUCOSE 103* 309*  BUN 33* 27*  CREATININE 2.48* 2.09*  CALCIUM 8.9 8.4*  MG 1.8 2.0   Liver Function Tests No results for input(s): AST, ALT, ALKPHOS, BILITOT, PROT, ALBUMIN in the last 72 hours. No results for input(s): LIPASE, AMYLASE in the last 72 hours. Cardiac Enzymes No results for input(s): CKTOTAL, CKMB, CKMBINDEX, TROPONINI in the last 72 hours.  BNP: BNP (last 3 results) Recent Labs    01/12/18 0410 03/19/18 0702 03/24/18 1752  BNP 2,922.0* >4,500.0* 893.2*    ProBNP (last 3 results) No results for input(s): PROBNP in the last 8760 hours.   D-Dimer No results for input(s): DDIMER in the last 72 hours. Hemoglobin A1C No results for input(s): HGBA1C in the last 72 hours. Fasting Lipid Panel No results for input(s): CHOL, HDL, LDLCALC, TRIG, CHOLHDL, LDLDIRECT in the last 72 hours. Thyroid Function Tests No results for input(s): TSH, T4TOTAL, T3FREE, THYROIDAB in the last 72 hours.  Invalid input(s): FREET3  Other results:   Imaging    No results found.   Medications:     Scheduled Medications: . amiodarone  200 mg  Oral Q6H  . ciprofloxacin  250 mg Oral BID  . clopidogrel  75 mg Oral Daily  . feeding supplement (GLUCERNA SHAKE)  237 mL Oral TID BM  . heparin  5,000 Units Subcutaneous Q8H  . hydroxychloroquine  200 mg Oral BID  . insulin aspart  0-5 Units Subcutaneous QHS  . insulin aspart  0-9 Units Subcutaneous TID WC  . loratadine  10 mg Oral Daily  . mometasone-formoterol  2 puff Inhalation BID  . multivitamin with minerals  1 tablet Oral Daily  . ranolazine  500 mg Oral BID  . sodium chloride flush  10-40 mL Intracatheter Q12H  . venlafaxine XR  150 mg Oral Q breakfast    Infusions: . sodium chloride    . sodium chloride    . DOBUTamine 4 mcg/kg/min (03/28/18 0300)  .  furosemide (LASIX) infusion 15 mg/hr (03/28/18 0300)    PRN Medications: sodium chloride, Place/Maintain arterial line **AND** sodium chloride, acetaminophen, diphenhydrAMINE, oxyCODONE-acetaminophen, prochlorperazine, sodium chloride flush     Assessment/Plan   1. Acute on chronic systolic CHF: Nonischemic cardiomyopathy.  Echo in 12/19 with EF 20-25%, mildly decreased RV systolic function.  Medtronic ICD.  She had been at [facility] with CHF exacerbation and VT, creatinine rose considerably with attempts at diuresis.  Lactate was elevated. Initial co-ox 38%.  SBP was low at times in the 80s-90s.  Dobutamine was started empirically at 4 mcg/kg/min, BP now stable in 110-120s. Co-ox pending. Significant amount of diuresis, CVP remains 17-18. Volume status remains elevated.  - Continue dobutamine 4 mcg/kg/min.  - Continue Lasix gtt at 15 mg/hr and repeat metolazone 2.5 mg x 1 again today.  - Continue unna boots 2. AKI: Creatinine up to peak of 3.17 from 1.11.  Suspect cardiorenal in setting of low cardiac output. She is markedly volume overloaded. - Creatinine down to 2.09 today with significant diuresis. Renal US normal.  - Hopefully, improving CO with dobutamine will continue to help creatinine.  3. VT: Patient had 17 episodes of VT with ICD discharges since 1/20.  It is possible that CHF was trigger of VT, she is markedly volume overloaded.  - Quiescent on amio.  - Currently getting amiodarone 200 mg every 6 hrs per EP.  - She is on ranolazine.  - Beta blockers on hold with suspected low output.  4. H/o CVA: On Plavix. No change.  5. H/o SLE: On hydroxychloroquine. Mo change.  6. UTI: Cipro. No change.  7. Hyponatremia: Na 128, fluid restrict.  8. Disposition:  - Will need SNF once optimized. Not very mobile.   Continue to diurese. Continue to mobilize with PT.   Length of Stay: 4  Luane School  03/28/2018, 7:39 AM  Advanced Heart Failure Team Pager [phone] (M-F; 7a -  4p)  Please contact [facility] for night-coverage after hours (4p -7a ) and weekends on [email or web address]  Patient seen with PA, agree with the above note.  Excellent diuresis.  Co-ox 58% this morning with CVP 18.  Still significant volume overload on exam.  Creatinine down to 2.09.    She will need ongoing diuresis, will continue dobutamine and Lasix gtt and give metolazone 2.5 x 1 again.  Fluid restrict with hyponatremia.    Very immobile.  Needs PT.  Will need SNF.   Marca Ancona 03/28/2018 8:01 AM

## 2018-03-29 ENCOUNTER — Ambulatory Visit: Payer: Medicaid Other | Admitting: Family

## 2018-03-29 LAB — CBC WITH DIFFERENTIAL/PLATELET
Abs Immature Granulocytes: 0.04 10*3/uL (ref 0.00–0.07)
Abs Immature Granulocytes: 0.05 10*3/uL (ref 0.00–0.07)
Basophils Absolute: 0 10*3/uL (ref 0.0–0.1)
Basophils Absolute: 0 10*3/uL (ref 0.0–0.1)
Basophils Relative: 0 %
Basophils Relative: 0 %
EOS PCT: 1 %
Eosinophils Absolute: 0.1 10*3/uL (ref 0.0–0.5)
Eosinophils Absolute: 0.3 10*3/uL (ref 0.0–0.5)
Eosinophils Relative: 2 %
HCT: 23.2 % — ABNORMAL LOW (ref 36.0–46.0)
HCT: 34.4 % — ABNORMAL LOW (ref 36.0–46.0)
Hemoglobin: 7.7 g/dL — ABNORMAL LOW (ref 12.0–15.0)
Hemoglobin: 11.2 g/dL — ABNORMAL LOW (ref 12.0–15.0)
Immature Granulocytes: 1 %
Immature Granulocytes: 1 %
Lymphocytes Relative: 12 %
Lymphocytes Relative: 10 %
Lymphs Abs: 0.9 10*3/uL (ref 0.7–4.0)
Lymphs Abs: 1.1 10*3/uL (ref 0.7–4.0)
MCH: 31 pg (ref 26.0–34.0)
MCH: 29.9 pg (ref 26.0–34.0)
MCHC: 33.2 g/dL (ref 30.0–36.0)
MCHC: 32.6 g/dL (ref 30.0–36.0)
MCV: 93.5 fL (ref 80.0–100.0)
MCV: 91.7 fL (ref 80.0–100.0)
Monocytes Absolute: 0.8 10*3/uL (ref 0.1–1.0)
Monocytes Absolute: 1 10*3/uL (ref 0.1–1.0)
Monocytes Relative: 10 %
Monocytes Relative: 10 %
NRBC: 0 % (ref 0.0–0.2)
Neutro Abs: 5.7 10*3/uL (ref 1.7–7.7)
Neutro Abs: 8.1 10*3/uL — ABNORMAL HIGH (ref 1.7–7.7)
Neutrophils Relative %: 76 %
Neutrophils Relative %: 77 %
Platelets: 176 10*3/uL (ref 150–400)
Platelets: 252 10*3/uL (ref 150–400)
RBC: 2.48 MIL/uL — ABNORMAL LOW (ref 3.87–5.11)
RBC: 3.75 MIL/uL — ABNORMAL LOW (ref 3.87–5.11)
RDW: 18.9 % — ABNORMAL HIGH (ref 11.5–15.5)
RDW: 18.7 % — ABNORMAL HIGH (ref 11.5–15.5)
WBC: 7.5 10*3/uL (ref 4.0–10.5)
WBC: 10.5 10*3/uL (ref 4.0–10.5)
nRBC: 0 % (ref 0.0–0.2)

## 2018-03-29 LAB — BASIC METABOLIC PANEL
Anion gap: 9 (ref 5–15)
BUN: 21 mg/dL — ABNORMAL HIGH (ref 6–20)
CO2: 30 mmol/L (ref 22–32)
Calcium: 7.7 mg/dL — ABNORMAL LOW (ref 8.9–10.3)
Chloride: 96 mmol/L — ABNORMAL LOW (ref 98–111)
Creatinine, Ser: 1.91 mg/dL — ABNORMAL HIGH (ref 0.44–1.00)
GFR calc Af Amer: 33 mL/min — ABNORMAL LOW (ref >60)
GFR calc non Af Amer: 29 mL/min — ABNORMAL LOW (ref >60)
Glucose, Bld: 96 mg/dL (ref 70–99)
Potassium: 3.4 mmol/L — ABNORMAL LOW (ref 3.5–5.1)
Sodium: 135 mmol/L (ref 135–145)

## 2018-03-29 LAB — COOXEMETRY PANEL
Carboxyhemoglobin: 2.4 % — ABNORMAL HIGH (ref 0.5–1.5)
Methemoglobin: 0.8 % (ref 0.0–1.5)
O2 Saturation: 78.3 %
Total hemoglobin: 12.9 g/dL (ref 12.0–16.0)

## 2018-03-29 LAB — GLUCOSE, CAPILLARY
GLUCOSE-CAPILLARY: 179 mg/dL — AB (ref 70–99)
Glucose-Capillary: 112 mg/dL — ABNORMAL HIGH (ref 70–99)
Glucose-Capillary: 150 mg/dL — ABNORMAL HIGH (ref 70–99)
Glucose-Capillary: 216 mg/dL — ABNORMAL HIGH (ref 70–99)

## 2018-03-29 LAB — MAGNESIUM: Magnesium: 1.7 mg/dL (ref 1.7–2.4)

## 2018-03-29 MED ORDER — MAGNESIUM SULFATE 2 GM/50ML IV SOLN: 2.0000 g | Freq: Once | INTRAVENOUS | Status: DC

## 2018-03-29 MED ORDER — POTASSIUM CHLORIDE CRYS ER 20 MEQ PO TBCR
30.0000 meq | EXTENDED_RELEASE_TABLET | ORAL | Status: AC
  Administered 2018-03-29 (×2): 30 meq via ORAL
  Filled 2018-03-29 (×2): qty 1

## 2018-03-29 MED ORDER — COLCHICINE 0.6 MG PO TABS
0.6000 mg | ORAL_TABLET | Freq: Every day | ORAL | Status: DC
  Administered 2018-03-29 – 2018-04-05 (×8): 0.6 mg via ORAL
  Filled 2018-03-29 (×8): qty 1

## 2018-03-29 MED ORDER — PREDNISONE 20 MG PO TABS: 40.0000 mg | ORAL_TABLET | Freq: Every day | ORAL | Status: DC

## 2018-03-29 MED ORDER — PREDNISONE 20 MG PO TABS
40.0000 mg | ORAL_TABLET | Freq: Every day | ORAL | Status: AC
  Administered 2018-03-29 – 2018-03-31 (×3): 40 mg via ORAL
  Filled 2018-03-29 (×3): qty 2

## 2018-03-29 MED ORDER — MAGNESIUM SULFATE 50 % IJ SOLN
3.0000 g | Freq: Once | INTRAVENOUS | Status: AC
  Administered 2018-03-29: 3 g via INTRAVENOUS
  Filled 2018-03-29: qty 6

## 2018-03-29 MED ORDER — SPIRONOLACTONE 12.5 MG HALF TABLET
12.5000 mg | ORAL_TABLET | Freq: Every day | ORAL | Status: DC
  Administered 2018-03-29 – 2018-03-31 (×3): 12.5 mg via ORAL
  Filled 2018-03-29 (×3): qty 1

## 2018-03-29 NOTE — Clinical Social Work Note (Addendum)
Clinical Social Work Assessment  Patient refused SNF placement   Patient Details  Name: Katherine [name] MRN: [id number] Date of Birth: 02-Feb-1960  Date of referral:  03/29/18               Reason for consult:  Facility Placement                Permission sought to share information with:  Case Manager, Family Supports Permission granted to share information::     Name::     [facility]   Agency::     Relationship::  significant other   Contact Information:  [phone]  Housing/Transportation Living arrangements for the past 2 months:  Single Family Home Source of Information:  Patient Patient Interpreter Needed:  None Criminal Activity/Legal Involvement Pertinent to Current Situation/Hospitalization:  No - Comment as needed Significant Relationships:  None Lives with:  Self(has supportive significant other) Do you feel safe going back to the place where you live?  Yes Need for family participation in patient care:  Yes (Comment)  Care giving concerns:  CSW consulted for SNF placement. Patient lives in a single level home alone. Patient states her main support if her significant other. CSW spoke with her friend, Renae Fickle and he states he will be there to assist her along with his nephew.    Social Worker assessment / plan:  CSW visit with the pattient at bedside. she was alert and oriented. CSW explained PT recommendations for SNF placement. Patient refused SNF and states she wants to go home. Including, she could not afford to turn over her monthly income to the SNF facility.  Employment status:  Disabled (Comment on whether or not currently receiving Disability) Insurance information:  Medicaid [place] PT Recommendations:  Skilled Nursing Facility(pateint refusing SNF) Information / Referral to community resources:     Patient/Family's Response to care:  Patient was very appreciative of CSW speaking with her regarding SNF placement.   Patient/Family's Understanding of and  Emotional Response to Diagnosis, Current Treatment, and Prognosis:  Patient is realistic regarding therapy needs and expressed being hopeful of  going  home with home health and having the support of her significant other for care. Patient expressed understanding of CSW role and discharge process as well as medical condition. No questions/concerns about plan or treatment at this time.   Emotional Assessment Appearance:  Appears stated age Attitude/Demeanor/Rapport:  Engaged(cooperative and pleasant) Affect (typically observed):  Appropriate, Accepting, Pleasant Orientation:  Oriented to Self, Oriented to Place, Oriented to  Time, Oriented to Situation Alcohol / Substance use:  Not Applicable Psych involvement (Current and /or in the community):  No (Comment)  Discharge Needs  Concerns to be addressed:  Care Coordination, Basic Needs Readmission within the last 30 days:  Yes Current discharge risk:  Dependent with Mobility Barriers to Discharge:  Continued Medical Work up   [employer], LCSWA 03/29/2018, 4:18 PM

## 2018-03-29 NOTE — Progress Notes (Signed)
Nutrition Follow-up  DOCUMENTATION CODES:   Morbid obesity  INTERVENTION:   -Continue Glucerna Shake po TID, each supplement provides 220 kcal and 10 grams of protein -Continue MVI with minerals daily -Magic cup TID with meals, each supplement provides 290 kcal and 9 grams of protein  NUTRITION DIAGNOSIS:   Inadequate oral intake related to decreased appetite as evidenced by meal completion < 50%.  Ongoing  GOAL:   Patient will meet greater than or equal to 90% of their needs  Progression  MONITOR:   PO intake, Supplement acceptance, Labs, Weight trends, Skin, I & O's  REASON FOR ASSESSMENT:   Malnutrition Screening Tool    ASSESSMENT:   Katherine [name] is a 58 y.o. female with history of chronic systolic heart failure secondary to nonischemic cardiomyopathy (LVEF 20 to 25%) status post ICD, ventricular tachycardia, CKD stage III, diabetes mellitus, hypertension, and hyperlipidemia, who has been transferred from [facility] for further evaluation and management of acute on chronic systolic heart failure.  Reviewed I/O's: -7 L x 24 hours and -30 L since admission  Spoke with pt at bedside, who was in good spirits and more alert and interactive since last visit. Pt reports her appetite continues to be poor; she is consuming mainly bites and sips at meals. Meal completion has been "up and down" per her report, but has consumed only 50% of her meal at most. Noted documented meal completion 0-50%. Pt has been consuming mainly fruit and cereal and often saves items (such as bananas) off meal trays to snack on later. Pt has been compliant with Glucerna supplements and would like to continue them.   Per MD notes, pt will likely require SNF placement. No LVAD work-up at this time secondary to limited social support.   Labs reviewed: K: 3.4, CBGS: 112 (inpatient orders for glycemic control are 0-5 units insulin aspart q HS and 0-9 units insulin aspart TID with meals).   Diet Order:    Diet Order            Diet heart healthy/carb modified Room service appropriate? Yes; Fluid consistency: Thin; Fluid restriction: 2000 mL Fluid  Diet effective now              EDUCATION NEEDS:   Education needs have been addressed  Skin:  Skin Assessment: Reviewed RN Assessment  Last BM:  03/25/18  Height:   Ht Readings from Last 1 Encounters:  03/24/18 6' (1.829 m)    Weight:   Wt Readings from Last 1 Encounters:  03/28/18 127 kg    Ideal Body Weight:  80.9 kg  BMI:  Body mass index is 37.97 kg/m.  Estimated Nutritional Needs:   Kcal:  2000-2200  Protein:  105-120 grams  Fluid:  2.2-.2.4 L    Leno Mathes A. Mayford Knife, RD, LDN, CDCES Registered Dietitian II Certified Diabetes Care and Education Specialist Pager: [phone] After hours Pager: [phone]

## 2018-03-29 NOTE — Progress Notes (Signed)
Physical Therapy Treatment Patient Details Name: Katherine [name] MRN: [id number] DOB: 09/10/1960 Today's Date: 03/29/2018    History of Present Illness Pt is a 58 y.o. female admitted to [facility] on 03/19/18 with SOB and weakness; worked up for CHF and hypokalemia. Transfer to [facility] 3/5 for further medical management. PMH includes CVA with residual L sided weakness, CHF, CKD, fibromyalgia, h/o seizures, htn, DM, ICD with generator replacement, lupus.    PT Comments    Patient seen for activity progression. Patient remains significantly limited in overall mobility and functional status. Continues to require modifications for bed height and increased physical assist for basic activity including bathing and hygiene. At this time, patient remains limited to transfers only. Continue to feel that SNF remains most appropriate venue for disposition until patient has regained ability to mobilize and care for herself for basic tasks.  Follow Up Recommendations  SNF;Supervision for mobility/OOB     Equipment Recommendations  (TBD)    Recommendations for Other Services OT consult     Precautions / Restrictions Precautions Precautions: Fall Restrictions Weight Bearing Restrictions: No    Mobility  Bed Mobility Overal bed mobility: Needs Assistance Bed Mobility: Supine to Sit     Supine to sit: Mod assist     General bed mobility comments: Increased time and effort to come to EOB, moderate assist to pull trunk to upright  Transfers Overall transfer level: Needs assistance Equipment used: Rolling walker (2 wheeled) Transfers: Sit to/from [employer] Sit to Stand: Mod assist Stand pivot transfers: Mod assist       General transfer comment: Requires bed at highest height to even attempt power up to stand, heavy relaince on RW and physical therapy assist  Ambulation/Gait         Gait velocity: decreased   General Gait Details: Took pivotal steps from bed to recliner  with HHA and modA to maintain balance, unable to ambulate at this time   Stairs             Wheelchair Mobility    Modified Rankin (Stroke Patients Only)       Balance Overall balance assessment: Needs assistance Sitting-balance support: No upper extremity supported;Feet supported Sitting balance-Leahy Scale: Fair     Standing balance support: Single extremity supported;Bilateral upper extremity supported Standing balance-Leahy Scale: Poor Standing balance comment: reliant on external support                            Cognition Arousal/Alertness: Awake/alert Behavior During Therapy: WFL for tasks assessed/performed Overall Cognitive Status: No family/caregiver present to determine baseline cognitive functioning Area of Impairment: Attention;Memory;Following commands;Safety/judgement;Problem solving                   Current Attention Level: Sustained Memory: Decreased short-term memory Following Commands: Follows one step commands with increased time Safety/Judgement: Decreased awareness of safety;Decreased awareness of deficits   Problem Solving: Slow processing;Decreased initiation;Requires verbal cues;Requires tactile cues        Exercises      General Comments General comments (skin integrity, edema, etc.): VSS throughout session, modest DOE      Pertinent Vitals/Pain Pain Assessment: Faces Faces Pain Scale: Hurts even more Pain Location: bil elbows, bil feet Pain Descriptors / Indicators: Discomfort;Sore Pain Intervention(s): Monitored during session    Home Living                      Prior Function  PT Goals (current goals can now be found in the care plan section) Acute Rehab PT Goals Patient Stated Goal: to feel better PT Goal Formulation: With patient Time For Goal Achievement: 04/09/18 Potential to Achieve Goals: Good Progress towards PT goals: Progressing toward goals    Frequency    Min  2X/week      PT Plan Current plan remains appropriate    Co-evaluation              AM-PAC PT "6 Clicks" Mobility   Outcome Measure  Help needed turning from your back to your side while in a flat bed without using bedrails?: A Little Help needed moving from lying on your back to sitting on the side of a flat bed without using bedrails?: A Lot Help needed moving to and from a bed to a chair (including a wheelchair)?: A Lot Help needed standing up from a chair using your arms (e.g., wheelchair or bedside chair)?: A Lot Help needed to walk in hospital room?: A Lot Help needed climbing 3-5 steps with a railing? : Total 6 Click Score: 12    End of Session Equipment Utilized During Treatment: Gait belt Activity Tolerance: Patient tolerated treatment well Patient left: in chair;with call bell/phone within reach Nurse Communication: Mobility status PT Visit Diagnosis: Other abnormalities of gait and mobility (R26.89);Muscle weakness (generalized) (M62.81);Difficulty in walking, not elsewhere classified (R26.2)     Time: 1610-9604 PT Time Calculation (min) (ACUTE ONLY): 22 min  Charges:  $Therapeutic Activity: 8-22 mins                     Charlotte Crumb, PT DPT  Board Certified Neurologic Specialist Acute Rehabilitation Services Pager [phone] Office [phone]    Fabio Asa 03/29/2018, 4:05 PM

## 2018-03-29 NOTE — Progress Notes (Addendum)
Patient ID: Randa Spike, female   DOB: 1960/02/09, 58 y.o.   MRN: [id number]     Advanced Heart Failure Rounding Note  PCP-Cardiologist: Lewayne Bunting, MD   Subjective:    Creatinine 2.7 => 3.17 => 3.07 => 2.8 => 2.48 => 2.09 => 1.91. Initial co-ox 38%.   Remains on dobutamine 4 and coox 78.3%. CVP 9-10. Negative - 7 L. No weight this am.   SBPs 110-120s.   Hgb down to 7.7 today (large drop) but still 12 on co-ox (?accuracy of CBC).   Breathing feeling better. Remains swollen into thighs. Elbows very sore and tender bilaterally, feels like prior gout.   Objective:   Weight Range: 127 kg Body mass index is 37.97 kg/m.   Vital Signs:   Temp:  [97.7 F (36.5 C)-99.1 F (37.3 C)] 99.1 F (37.3 C) (03/10 0424) Pulse Rate:  [72-78] 78 (03/10 0424) Resp:  [13-18] 13 (03/10 0424) BP: (92-115)/(64-87) 96/73 (03/10 0424) SpO2:  [95 %-100 %] 96 % (03/10 0400) Last BM Date: 03/25/18  Weight change: Filed Weights   03/25/18 0359 03/27/18 0343 03/28/18 0300  Weight: (!) 143.8 kg (!) 138.3 kg 127 kg   Intake/Output:   Intake/Output Summary (Last 24 hours) at 03/29/2018 0805 Last data filed at 03/29/2018 9169 Gross per 24 hour  Intake 1064.68 ml  Output 8150 ml  Net -7085.32 ml    Physical Exam    General:NAD  HEENT: Normal Neck: Supple. JVP remains 10-11. Carotids 2+ bilat; no bruits. No thyromegaly or nodule noted. Cor: PMI nondisplaced. RRR, No M/G/R noted Lungs: CTAB, normal effort. Abdomen: Soft, non-tender, non-distended, no HSM. No bruits or masses. +BS  Extremities: No cyanosis, clubbing, or rash. 2-3+ edema into thighs.   Neuro: Alert & orientedx3, cranial nerves grossly intact. moves all 4 extremities w/o difficulty. Affect pleasant   Telemetry   NSR 70s with PVCs, no VT, personally reviewed.   Labs    CBC Recent Labs    03/28/18 0320 03/29/18 0500  WBC 6.8 7.5  NEUTROABS 4.6 5.7  HGB 11.2* 7.7*  HCT 34.3* 23.2*  MCV 93.0 93.5  PLT 244 176    Basic Metabolic Panel Recent Labs    45/03/88 0320 03/29/18 0500  NA 128* 135  K 3.8 3.4*  CL 88* 96*  CO2 29 30  GLUCOSE 309* 96  BUN 27* 21*  CREATININE 2.09* 1.91*  CALCIUM 8.4* 7.7*  MG 2.0 1.7   Liver Function Tests No results for input(s): AST, ALT, ALKPHOS, BILITOT, PROT, ALBUMIN in the last 72 hours. No results for input(s): LIPASE, AMYLASE in the last 72 hours. Cardiac Enzymes No results for input(s): CKTOTAL, CKMB, CKMBINDEX, TROPONINI in the last 72 hours.  BNP: BNP (last 3 results) Recent Labs    01/12/18 0410 03/19/18 0702 03/24/18 1752  BNP 2,922.0* >4,500.0* 893.2*    ProBNP (last 3 results) No results for input(s): PROBNP in the last 8760 hours.   D-Dimer No results for input(s): DDIMER in the last 72 hours. Hemoglobin A1C No results for input(s): HGBA1C in the last 72 hours. Fasting Lipid Panel No results for input(s): CHOL, HDL, LDLCALC, TRIG, CHOLHDL, LDLDIRECT in the last 72 hours. Thyroid Function Tests No results for input(s): TSH, T4TOTAL, T3FREE, THYROIDAB in the last 72 hours.  Invalid input(s): FREET3  Other results:   Imaging    No results found.   Medications:     Scheduled Medications: . amiodarone  200 mg Oral Q6H  . ciprofloxacin  250  mg Oral BID  . clopidogrel  75 mg Oral Daily  . feeding supplement (GLUCERNA SHAKE)  237 mL Oral TID BM  . heparin  5,000 Units Subcutaneous Q8H  . hydroxychloroquine  200 mg Oral BID  . insulin aspart  0-5 Units Subcutaneous QHS  . insulin aspart  0-9 Units Subcutaneous TID WC  . loratadine  10 mg Oral Daily  . mometasone-formoterol  2 puff Inhalation BID  . multivitamin with minerals  1 tablet Oral Daily  . ranolazine  500 mg Oral BID  . sodium chloride flush  10-40 mL Intracatheter Q12H  . venlafaxine XR  150 mg Oral Q breakfast    Infusions: . sodium chloride    . sodium chloride    . DOBUTamine 4 mcg/kg/min (03/29/18 0426)  . furosemide (LASIX) infusion 15 mg/hr  (03/29/18 0426)    PRN Medications: sodium chloride, Place/Maintain arterial line **AND** sodium chloride, acetaminophen, diphenhydrAMINE, oxyCODONE-acetaminophen, prochlorperazine, sodium chloride flush     Assessment/Plan   1. Acute on chronic systolic CHF: Nonischemic cardiomyopathy.  Echo in 12/19 with EF 20-25%, mildly decreased RV systolic function.  [employer]onic ICD.  She had been at [facility] with CHF exacerbation and VT, creatinine rose considerably with attempts at diuresis.  Lactate was elevated. Initial co-ox 38%.  SBP was low at times in the 80s-90s.  Dobutamine was started empirically at 4 mcg/kg/min, BP now stable in 110-120s. Co-ox 78%. Significant amount of diuresis, CVP down to 9-10. Still volume overload by exam but improving.  - Continue dobutamine 4 mcg/kg/min.  - Continue Lasix gtt at 15 mg/hr at least 1 more day. No metolazone today.  - Add spiro 12.5 mg daily and replace K.  - Continue unna boots 2. AKI: Creatinine up to peak of 3.17 from 1.11.  Suspect cardiorenal in setting of low cardiac output. She was admitted markedly volume overloaded. - Creatinine down to 1.9 today with significant diuresis. Renal US normal.  - Hopefully, improving CO with dobutamine will continue to help creatinine.  3. VT: Patient had 17 episodes of VT with ICD discharges since 1/20.  It is possible that CHF was trigger of VT, she is markedly volume overloaded.  - Quiescent on amio.  - Currently getting amiodarone 200 mg every 6 hrs per EP.  - She is on ranolazine.  - Beta blockers on hold with suspected low output.  4. H/o CVA: On Plavix. No change.  5. H/o SLE: On hydroxychloroquine. Mo change.  6. UTI: Cipro. No change.  7. Hyponatremia: Resolved.  8. Gout: Suspect flare in shoulders.  - Give colchicine - Give 3 days of 40 mg prednisone daily.  8. Disposition:  - PT recommending SNF.   Continues to diurese. PT recommending SNF.   Length of Stay: [address]  Graciella Freer, [place]   03/29/2018, 8:05 AM  Advanced Heart Failure Team Pager [phone] (M-F; 7a - 4p)  Please contact [facility] for night-coverage after hours (4p -7a ) and weekends on [email or web address]  Patient seen with PA, agree with the above note.  Excellent diuresis again.  Co-ox 78% this morning with CVP 9-10.  Still some volume overload on exam.  Creatinine down to 1.9.  Hgb 7.7 on CBC (marked drop) but 12 off co-ox.  She denies overt bleeding.   JVP 10-12 cm, regular S1S2 no murmur, 1+ edema to knees (improved).  Clear lungs.   Continue current dobutamine at 4 and give 1 more day of Lasix gtt 15 mg/hr.  No metolazone today.  I will have her start spironolactone 12.5 mg daily.  If creatinine continues to improve, will add digoxin tomorrow.  It may be difficult to titrate her off dobutamine. With poor mobility and renal dysfunction, would be a difficult candidate for LVAD.   I will repeat CBC stat.  Hopefully the am CBC was spurious (co-ox with hgb 12).    Treat gout with prednisone burst and colchicine.   Very immobile.  Continue PT.  Will need SNF.   Marca Ancona 03/29/2018 8:25 AM

## 2018-03-30 LAB — COOXEMETRY PANEL
CARBOXYHEMOGLOBIN: 2.1 % — AB (ref 0.5–1.5)
Carboxyhemoglobin: 1.8 % — ABNORMAL HIGH (ref 0.5–1.5)
Methemoglobin: 1.5 % (ref 0.0–1.5)
Methemoglobin: 1.4 % (ref 0.0–1.5)
O2 Saturation: 76.9 %
O2 Saturation: 65.4 %
Total hemoglobin: 12.2 g/dL (ref 12.0–16.0)
Total hemoglobin: 12.6 g/dL (ref 12.0–16.0)

## 2018-03-30 LAB — GLUCOSE, CAPILLARY
Glucose-Capillary: 161 mg/dL — ABNORMAL HIGH (ref 70–99)
Glucose-Capillary: 144 mg/dL — ABNORMAL HIGH (ref 70–99)
Glucose-Capillary: 172 mg/dL — ABNORMAL HIGH (ref 70–99)
Glucose-Capillary: 266 mg/dL — ABNORMAL HIGH (ref 70–99)

## 2018-03-30 LAB — CBC WITH DIFFERENTIAL/PLATELET
Abs Immature Granulocytes: 0.04 10*3/uL (ref 0.00–0.07)
Basophils Absolute: 0 10*3/uL (ref 0.0–0.1)
Basophils Relative: 0 %
Eosinophils Absolute: 0 10*3/uL (ref 0.0–0.5)
Eosinophils Relative: 0 %
HCT: 33.8 % — ABNORMAL LOW (ref 36.0–46.0)
Hemoglobin: 11.6 g/dL — ABNORMAL LOW (ref 12.0–15.0)
Immature Granulocytes: 0 %
LYMPHS ABS: 1.1 10*3/uL (ref 0.7–4.0)
Lymphocytes Relative: 8 %
MCH: 31 pg (ref 26.0–34.0)
MCHC: 34.3 g/dL (ref 30.0–36.0)
MCV: 90.4 fL (ref 80.0–100.0)
Monocytes Absolute: 1.2 10*3/uL — ABNORMAL HIGH (ref 0.1–1.0)
Monocytes Relative: 9 %
NEUTROS PCT: 83 %
Neutro Abs: 11.6 10*3/uL — ABNORMAL HIGH (ref 1.7–7.7)
Platelets: 278 10*3/uL (ref 150–400)
RBC: 3.74 MIL/uL — ABNORMAL LOW (ref 3.87–5.11)
RDW: 18.4 % — ABNORMAL HIGH (ref 11.5–15.5)
WBC: 14 10*3/uL — ABNORMAL HIGH (ref 4.0–10.5)
nRBC: 0 % (ref 0.0–0.2)

## 2018-03-30 LAB — BASIC METABOLIC PANEL
Anion gap: 9 (ref 5–15)
BUN: 25 mg/dL — ABNORMAL HIGH (ref 6–20)
CALCIUM: 9.2 mg/dL (ref 8.9–10.3)
CO2: 37 mmol/L — AB (ref 22–32)
Chloride: 87 mmol/L — ABNORMAL LOW (ref 98–111)
Creatinine, Ser: 2.12 mg/dL — ABNORMAL HIGH (ref 0.44–1.00)
GFR calc Af Amer: 29 mL/min — ABNORMAL LOW (ref >60)
GFR calc non Af Amer: 25 mL/min — ABNORMAL LOW (ref >60)
Glucose, Bld: 176 mg/dL — ABNORMAL HIGH (ref 70–99)
Potassium: 3.9 mmol/L (ref 3.5–5.1)
Sodium: 133 mmol/L — ABNORMAL LOW (ref 135–145)

## 2018-03-30 LAB — MAGNESIUM: Magnesium: 2.6 mg/dL — ABNORMAL HIGH (ref 1.7–2.4)

## 2018-03-30 MED ORDER — TORSEMIDE 20 MG PO TABS
40.0000 mg | ORAL_TABLET | Freq: Two times a day (BID) | ORAL | Status: DC
  Administered 2018-03-30 – 2018-03-31 (×3): 40 mg via ORAL
  Filled 2018-03-30 (×4): qty 2

## 2018-03-30 MED ORDER — IVABRADINE HCL 5 MG PO TABS: 5.0000 mg | ORAL_TABLET | Freq: Once | ORAL | Status: DC

## 2018-03-30 MED ORDER — HYDRALAZINE HCL 10 MG PO TABS
10.0000 mg | ORAL_TABLET | Freq: Three times a day (TID) | ORAL | Status: DC
  Administered 2018-03-30 – 2018-03-31 (×4): 10 mg via ORAL
  Filled 2018-03-30 (×4): qty 1

## 2018-03-30 MED ORDER — DOBUTAMINE IN D5W 4-5 MG/ML-% IV SOLN: 4.0000 ug/kg/min | INTRAVENOUS | Status: DC

## 2018-03-30 MED ORDER — DOBUTAMINE IN D5W 4-5 MG/ML-% IV SOLN
3.0000 ug/kg/min | INTRAVENOUS | Status: DC
  Administered 2018-03-30: 4 ug/kg/min via INTRAVENOUS

## 2018-03-30 NOTE — Progress Notes (Addendum)
Patient ID: Katherine [name], female   DOB: 1960/04/01, 58 y.o.   MRN: [id number]     Advanced Heart Failure Rounding Note  PCP-Cardiologist: Lewayne Bunting, MD   Subjective:    Creatinine 2.7 => 3.17 => 3.07 => 2.8 => 2.48 => 2.09 => 1.91 => 2.12. Initial co-ox 38%.   Remains on dobutamine 4 and coox 76.9%. CVP 7-8. Negative another 4.7L. Down 55 lbs total, though all bed weights so may be inaccurate to some degree.   SBPs 110-120s   Feeling much better. Denies SOB at rest. Not very active. She is Medicaid, so may not be able to get SNF without "giving up her check for the month".   Objective:   Weight Range: 119 kg Body mass index is 35.58 kg/m.   Vital Signs:   Temp:  [98.6 F (37 C)-98.8 F (37.1 C)] 98.6 F (37 C) (03/11 0406) Pulse Rate:  [75-83] 83 (03/11 0406) Resp:  [15-16] 15 (03/10 1200) BP: (91-125)/(59-114) 113/64 (03/11 0406) SpO2:  [93 %-97 %] 96 % (03/11 0406) Weight:  [594 kg] 119 kg (03/11 0406) Last BM Date: 03/25/18  Weight change: Filed Weights   03/27/18 0343 03/28/18 0300 03/30/18 0406  Weight: (!) 138.3 kg 127 kg 119 kg   Intake/Output:   Intake/Output Summary (Last 24 hours) at 03/30/2018 0757 Last data filed at 03/30/2018 0500 Gross per 24 hour  Intake 544.14 ml  Output 5250 ml  Net -4705.86 ml    Physical Exam    General: NAD  HEENT: Normal Neck: Supple. JVP difficult . Carotids 2+ bilat; no bruits. No thyromegaly or nodule noted. Cor: PMI nondisplaced. RRR, No M/G/R noted Lungs: CTAB, normal effort. Abdomen: Soft, non-tender, non-distended, no HSM. No bruits or masses. +BS  Extremities: No cyanosis, clubbing, or rash. 2+ edema into thighs.  Neuro: Alert & orientedx3, cranial nerves grossly intact. moves all 4 extremities w/o difficulty. Affect pleasant   Telemetry   NSR 80s, personally reviewed.   Labs    CBC Recent Labs    03/29/18 0815 03/30/18 0245  WBC 10.5 14.0*  NEUTROABS 8.1* 11.6*  HGB 11.2* 11.6*  HCT 34.4*  33.8*  MCV 91.7 90.4  PLT 252 278   Basic Metabolic Panel Recent Labs    58/59/29 0500 03/30/18 0245  NA 135 133*  K 3.4* 3.9  CL 96* 87*  CO2 30 37*  GLUCOSE 96 176*  BUN 21* 25*  CREATININE 1.91* 2.12*  CALCIUM 7.7* 9.2  MG 1.7 2.6*   Liver Function Tests No results for input(s): AST, ALT, ALKPHOS, BILITOT, PROT, ALBUMIN in the last 72 hours. No results for input(s): LIPASE, AMYLASE in the last 72 hours. Cardiac Enzymes No results for input(s): CKTOTAL, CKMB, CKMBINDEX, TROPONINI in the last 72 hours.  BNP: BNP (last 3 results) Recent Labs    01/12/18 0410 03/19/18 0702 03/24/18 1752  BNP 2,922.0* >4,500.0* 893.2*    ProBNP (last 3 results) No results for input(s): PROBNP in the last 8760 hours.   D-Dimer No results for input(s): DDIMER in the last 72 hours. Hemoglobin A1C No results for input(s): HGBA1C in the last 72 hours. Fasting Lipid Panel No results for input(s): CHOL, HDL, LDLCALC, TRIG, CHOLHDL, LDLDIRECT in the last 72 hours. Thyroid Function Tests No results for input(s): TSH, T4TOTAL, T3FREE, THYROIDAB in the last 72 hours.  Invalid input(s): FREET3  Other results:   Imaging    No results found.   Medications:     Scheduled Medications: . amiodarone  200 mg Oral Q6H  . clopidogrel  75 mg Oral Daily  . colchicine  0.6 mg Oral Daily  . feeding supplement (GLUCERNA SHAKE)  237 mL Oral TID BM  . heparin  5,000 Units Subcutaneous Q8H  . hydroxychloroquine  200 mg Oral BID  . insulin aspart  0-5 Units Subcutaneous QHS  . insulin aspart  0-9 Units Subcutaneous TID WC  . loratadine  10 mg Oral Daily  . mometasone-formoterol  2 puff Inhalation BID  . multivitamin with minerals  1 tablet Oral Daily  . predniSONE  40 mg Oral Q breakfast  . ranolazine  500 mg Oral BID  . sodium chloride flush  10-40 mL Intracatheter Q12H  . spironolactone  12.5 mg Oral Daily  . venlafaxine XR  150 mg Oral Q breakfast    Infusions: . sodium chloride     . sodium chloride    . DOBUTamine 4 mcg/kg/min (03/30/18 0500)  . furosemide (LASIX) infusion 15 mg/hr (03/30/18 0500)    PRN Medications: sodium chloride, Place/Maintain arterial line **AND** sodium chloride, acetaminophen, diphenhydrAMINE, oxyCODONE-acetaminophen, prochlorperazine, sodium chloride flush     Assessment/Plan   1. Acute on chronic systolic CHF: Nonischemic cardiomyopathy.  Echo in 12/19 with EF 20-25%, mildly decreased RV systolic function.  [employer] ICD.  She had been at [facility] with CHF exacerbation and VT, creatinine rose considerably with attempts at diuresis.  Lactate was elevated. Initial co-ox 38%.  SBP was low at times in the 80s-90s.  Dobutamine was started empirically at 4 mcg/kg/min, BP now stable in 110-120s. Co-ox 78%. Significant amount of diuresis, CVP 7-8. Volume status improved.  - Decrease dobutamine to match weight, and then will continue to wean tomorrow.  - Stop lasix gtt. Start torsemide 40 mg BID this evening.  - Continue spiro 12.5 mg daily and replace K.  - Add hydralazine 10 mg TID.  - Continue unna boots 2. AKI: Creatinine up to peak of 3.17 from 1.11.  Suspect cardiorenal in setting of low cardiac output. She was admitted markedly volume overloaded. - Creatinine 2.1 today. Stopping IV lasix. Renal US normal.  - Hopefully, improving CO with dobutamine will continue to help creatinine.  3. VT: Patient had 17 episodes of VT with ICD discharges since 1/20.  It is possible that CHF was trigger of VT, she is markedly volume overloaded.  - Quiescent on amio.  - Currently getting amiodarone 200 mg every 6 hrs per EP.  - She is on ranolazine.  - Beta blockers on hold with suspected low output.  4. H/o CVA: On Plavix. No change.   5. H/o SLE: On hydroxychloroquine. No change.   6. UTI: Cipro. No change.  7. Hyponatremia: Resolved.  8. Gout: Suspect flare in shoulders.  - Continue colchicine - Day 2 of 3 of 40 mg prednisone.  8. Disposition:  - PT  recommending SNF. They have seen.   Continues to diurese. PT recommending SNF.   Length of Stay: [address]  Luane School  03/30/2018, 7:57 AM  Advanced Heart Failure Team Pager [phone] (M-F; 7a - 4p)  Please contact [facility] for night-coverage after hours (4p -7a ) and weekends on [email or web address]  Patient seen with PA, agree with the above note. Excellent diuresis again. Co-ox 78% this morning with CVP 7-8. Volume status looks better on exam, still with some dependent edema. Creatinine higher at 2.1. Hgb 11.6 (low hgb yesterday was a spurious result).    JVP 8 cm, regular S1S2 no murmur, 1+  edema at ankles (improved).  Clear lungs.   I will start weaning dobutamine today.  Repeat co-ox in afternoon.  I will stop Lasix gtt and start torsemide 40 mg bid this evening. Continue spironolactone and add low dose hydralazine/Imdur.  It may be difficult to titrate her off dobutamine. With poor mobility and renal dysfunction, would be a difficult candidate for LVAD.   Treat gout with prednisone burst (day 2) and colchicine.   Very immobile. Continue PT. Will need SNF.   Marca Ancona 03/30/2018 8:15 AM

## 2018-03-31 LAB — GLUCOSE, CAPILLARY
Glucose-Capillary: 153 mg/dL — ABNORMAL HIGH (ref 70–99)
Glucose-Capillary: 252 mg/dL — ABNORMAL HIGH (ref 70–99)
Glucose-Capillary: 179 mg/dL — ABNORMAL HIGH (ref 70–99)
Glucose-Capillary: 153 mg/dL — ABNORMAL HIGH (ref 70–99)

## 2018-03-31 LAB — CBC WITH DIFFERENTIAL/PLATELET
Abs Immature Granulocytes: 0.06 10*3/uL (ref 0.00–0.07)
Basophils Absolute: 0 10*3/uL (ref 0.0–0.1)
Basophils Relative: 0 %
Eosinophils Absolute: 0 10*3/uL (ref 0.0–0.5)
Eosinophils Relative: 0 %
HEMATOCRIT: 35.4 % — AB (ref 36.0–46.0)
Hemoglobin: 11.4 g/dL — ABNORMAL LOW (ref 12.0–15.0)
Immature Granulocytes: 1 %
Lymphocytes Relative: 11 %
Lymphs Abs: 1.3 10*3/uL (ref 0.7–4.0)
MCH: 29.5 pg (ref 26.0–34.0)
MCHC: 32.2 g/dL (ref 30.0–36.0)
MCV: 91.5 fL (ref 80.0–100.0)
MONO ABS: 1.1 10*3/uL — AB (ref 0.1–1.0)
Monocytes Relative: 9 %
Neutro Abs: 9.6 10*3/uL — ABNORMAL HIGH (ref 1.7–7.7)
Neutrophils Relative %: 79 %
Platelets: 282 10*3/uL (ref 150–400)
RBC: 3.87 MIL/uL (ref 3.87–5.11)
RDW: 18.4 % — AB (ref 11.5–15.5)
WBC: 12.1 10*3/uL — ABNORMAL HIGH (ref 4.0–10.5)
nRBC: 0 % (ref 0.0–0.2)

## 2018-03-31 LAB — BASIC METABOLIC PANEL
Anion gap: 12 (ref 5–15)
BUN: 28 mg/dL — ABNORMAL HIGH (ref 6–20)
CHLORIDE: 88 mmol/L — AB (ref 98–111)
CO2: 33 mmol/L — ABNORMAL HIGH (ref 22–32)
Calcium: 8.8 mg/dL — ABNORMAL LOW (ref 8.9–10.3)
Creatinine, Ser: 2 mg/dL — ABNORMAL HIGH (ref 0.44–1.00)
GFR calc Af Amer: 31 mL/min — ABNORMAL LOW (ref >60)
GFR calc non Af Amer: 27 mL/min — ABNORMAL LOW (ref >60)
Glucose, Bld: 201 mg/dL — ABNORMAL HIGH (ref 70–99)
Potassium: 3.7 mmol/L (ref 3.5–5.1)
Sodium: 133 mmol/L — ABNORMAL LOW (ref 135–145)

## 2018-03-31 LAB — COOXEMETRY PANEL
Carboxyhemoglobin: 2 % — ABNORMAL HIGH (ref 0.5–1.5)
Carboxyhemoglobin: 1.6 % — ABNORMAL HIGH (ref 0.5–1.5)
Methemoglobin: 1.4 % (ref 0.0–1.5)
Methemoglobin: 1.3 % (ref 0.0–1.5)
O2 Saturation: 84.8 %
O2 Saturation: 63.3 %
TOTAL HEMOGLOBIN: 12.3 g/dL (ref 12.0–16.0)
Total hemoglobin: 12 g/dL (ref 12.0–16.0)

## 2018-03-31 LAB — MAGNESIUM: Magnesium: 2.5 mg/dL — ABNORMAL HIGH (ref 1.7–2.4)

## 2018-03-31 MED ORDER — DOBUTAMINE IN D5W 4-5 MG/ML-% IV SOLN
1.0000 ug/kg/min | INTRAVENOUS | Status: DC
  Filled 2018-03-31: qty 250

## 2018-03-31 MED ORDER — POTASSIUM CHLORIDE CRYS ER 20 MEQ PO TBCR
30.0000 meq | EXTENDED_RELEASE_TABLET | Freq: Once | ORAL | Status: AC
  Administered 2018-03-31: 30 meq via ORAL
  Filled 2018-03-31: qty 1

## 2018-03-31 MED ORDER — ISOSORBIDE MONONITRATE ER 30 MG PO TB24
30.0000 mg | ORAL_TABLET | Freq: Every day | ORAL | Status: DC
  Administered 2018-03-31 – 2018-04-01 (×2): 30 mg via ORAL
  Filled 2018-03-31 (×2): qty 1

## 2018-03-31 MED ORDER — DIGOXIN 125 MCG PO TABS
0.0625 mg | ORAL_TABLET | Freq: Every day | ORAL | Status: DC
  Administered 2018-03-31 – 2018-04-05 (×6): 0.0625 mg via ORAL
  Filled 2018-03-31 (×6): qty 1

## 2018-03-31 MED ORDER — HYDRALAZINE HCL 50 MG PO TABS
25.0000 mg | ORAL_TABLET | Freq: Three times a day (TID) | ORAL | Status: DC
  Administered 2018-03-31 – 2018-04-02 (×6): 25 mg via ORAL
  Filled 2018-03-31 (×6): qty 1

## 2018-03-31 NOTE — Progress Notes (Signed)
Foley Cath removed 03/30/18 per order. Decrease UOP unable to void. Bladder scan done and I & O cath done att 0300 with 450cc of amber urine. We will need to continue to monitor UOP closely.

## 2018-03-31 NOTE — Progress Notes (Signed)
Orthopedic Tech Progress Note Patient Details:  Katherine [name] [birth year] [id number]  Ortho Devices Type of Ortho Device: Radio broadcast assistant Ortho Device/Splint Location: bil Ortho Device/Splint Interventions: Ordered, Application, Adjustment   Post Interventions Patient Tolerated: Well Instructions Provided: Adjustment of device, Care of device   Bilan Katherine [name] 03/31/2018, 3:10 PM

## 2018-03-31 NOTE — Progress Notes (Addendum)
Patient ID: Katherine [name], female   DOB: August 09, 1960, 58 y.o.   MRN: [id number]     Advanced Heart Failure Rounding Note  PCP-Cardiologist: Lewayne Bunting, MD   Subjective:    Creatinine 2.7 => 3.17 => 3.07 => 2.8 => 2.48 => 2.09 => 1.91 => 2.12 -> 2.0. Initial co-ox 38%.   Coox 84.8% on dobutamine 3. CVP 8-9 cm. Negative another 2+ L. Weights inaccurate.   SBPs 110-120s   Feeling OK this am. Asking about going home. Having trouble voiding post foley removal. Required in and out cath this am.   Objective:   Weight Range: 104.3 kg Body mass index is 31.19 kg/m.   Vital Signs:   Temp:  [98.1 F (36.7 C)-98.6 F (37 C)] 98.1 F (36.7 C) (03/11 2023) Pulse Rate:  [79-83] 79 (03/11 2023) Resp:  [14-18] 14 (03/11 2023) BP: (109-126)/(67-85) 114/85 (03/11 2023) SpO2:  [94 %-95 %] 94 % (03/11 2023) Weight:  [104.3 kg] 104.3 kg (03/12 0535) Last BM Date: 03/25/18  Weight change: Filed Weights   03/28/18 0300 03/30/18 0406 03/31/18 0535  Weight: 127 kg 119 kg 104.3 kg   Intake/Output:   Intake/Output Summary (Last 24 hours) at 03/31/2018 0759 Last data filed at 03/31/2018 0534 Gross per 24 hour  Intake 207.18 ml  Output 900 ml  Net -692.82 ml    Physical Exam    General: NAD  HEENT: Normal Neck: Supple. JVP 8-9. Carotids 2+ bilat; no bruits. No thyromegaly or nodule noted. Cor: PMI nondisplaced. RRR, No M/G/R noted Lungs: CTAB, normal effort. Abdomen: Soft, non-tender, non-distended, no HSM. No bruits or masses. +BS  Extremities: No cyanosis, clubbing, or rash. 1+ edema into thighs, dependent.  Neuro: Alert & orientedx3, cranial nerves grossly intact. moves all 4 extremities w/o difficulty. Affect pleasant   Telemetry   NSR 70-80s, personally reviewed.   Labs    CBC Recent Labs    03/30/18 0245 03/31/18 0500  WBC 14.0* 12.1*  NEUTROABS 11.6* 9.6*  HGB 11.6* 11.4*  HCT 33.8* 35.4*  MCV 90.4 91.5  PLT 278 282   Basic Metabolic Panel Recent Labs   03/30/18 0245 03/31/18 0500  NA 133* 133*  K 3.9 3.7  CL 87* 88*  CO2 37* 33*  GLUCOSE 176* 201*  BUN 25* 28*  CREATININE 2.12* 2.00*  CALCIUM 9.2 8.8*  MG 2.6* 2.5*   Liver Function Tests No results for input(s): AST, ALT, ALKPHOS, BILITOT, PROT, ALBUMIN in the last 72 hours. No results for input(s): LIPASE, AMYLASE in the last 72 hours. Cardiac Enzymes No results for input(s): CKTOTAL, CKMB, CKMBINDEX, TROPONINI in the last 72 hours.  BNP: BNP (last 3 results) Recent Labs    01/12/18 0410 03/19/18 0702 03/24/18 1752  BNP 2,922.0* >4,500.0* 893.2*    ProBNP (last 3 results) No results for input(s): PROBNP in the last 8760 hours.   D-Dimer No results for input(s): DDIMER in the last 72 hours. Hemoglobin A1C No results for input(s): HGBA1C in the last 72 hours. Fasting Lipid Panel No results for input(s): CHOL, HDL, LDLCALC, TRIG, CHOLHDL, LDLDIRECT in the last 72 hours. Thyroid Function Tests No results for input(s): TSH, T4TOTAL, T3FREE, THYROIDAB in the last 72 hours.  Invalid input(s): FREET3  Other results:   Imaging    No results found.   Medications:     Scheduled Medications: . amiodarone  200 mg Oral Q6H  . clopidogrel  75 mg Oral Daily  . colchicine  0.6 mg Oral Daily  .  feeding supplement (GLUCERNA SHAKE)  237 mL Oral TID BM  . heparin  5,000 Units Subcutaneous Q8H  . hydrALAZINE  10 mg Oral Q8H  . hydroxychloroquine  200 mg Oral BID  . insulin aspart  0-5 Units Subcutaneous QHS  . insulin aspart  0-9 Units Subcutaneous TID WC  . loratadine  10 mg Oral Daily  . mometasone-formoterol  2 puff Inhalation BID  . multivitamin with minerals  1 tablet Oral Daily  . ranolazine  500 mg Oral BID  . sodium chloride flush  10-40 mL Intracatheter Q12H  . spironolactone  12.5 mg Oral Daily  . torsemide  40 mg Oral BID  . venlafaxine XR  150 mg Oral Q breakfast    Infusions: . sodium chloride    . sodium chloride    . DOBUTamine      PRN  Medications: sodium chloride, Place/Maintain arterial line **AND** sodium chloride, acetaminophen, diphenhydrAMINE, oxyCODONE-acetaminophen, prochlorperazine, sodium chloride flush     Assessment/Plan   1. Acute on chronic systolic CHF: Nonischemic cardiomyopathy.  Echo in 12/19 with EF 20-25%, mildly decreased RV systolic function.  Medtronic ICD.  She had been at [facility] with CHF exacerbation and VT, creatinine rose considerably with attempts at diuresis.  Lactate was elevated. Initial co-ox 38%.  SBP was low at times in the 80s-90s.  Dobutamine was started empirically at 4 mcg/kg/min, BP now stable in 110-120s. Co-ox 84%. Significant amount of diuresis, CVP 8-9. Volume status improved.  - Decrease dobutamine to 1.5 mcg/kg/min. Continue to wean.  - Continue torsemide 40 mg BID. May need more.  - Continue spiro 12.5 mg daily and replace K.  - Continue hydralazine 10 mg TID. Will follow pressures with dobutamine wean before increasing.  - Continue unna boots 2. AKI: Creatinine up to peak of 3.17 from 1.11.  Suspect cardiorenal in setting of low cardiac output. She was admitted markedly volume overloaded. - Creatinine 2.0 today. Now on po torsemide. Renal US normal.  - Hopefully, improving CO with dobutamine will continue to help creatinine.  3. VT: Patient had 17 episodes of VT with ICD discharges since 1/20.  It is possible that CHF was trigger of VT. Volume status improved.  - Quiescent on amio.  - Currently getting amiodarone 200 mg every 6 hrs per EP.  - She is on ranolazine.  - Beta blockers on hold with suspected low output.  4. H/o CVA: On Plavix. No change.   5. H/o SLE: On hydroxychloroquine. No change.   6. UTI: Cipro. Improved.  7. Hyponatremia: Resolved. 8. Gout: Suspect flare in shoulders.  - Continue colchicine - Day 3 of 3 of 40 mg prednisone.  8. Disposition:  - PT recommending SNF. They have seen. She refuses due to being Medicaid. Will need HHPT/RN.   Length of Stay: [address]   Graciella Freer, [place]  03/31/2018, 7:59 AM  Advanced Heart Failure Team Pager [phone] (M-F; 7a - 4p)  Please contact [facility] for night-coverage after hours (4p -7a ) and weekends on [email or web address]  Patient seen with PA, agree with the above note. Excellent diuresisagain and on po torsemide. Co-ox84% this morning with CVP8-9.   JVP 8 cm, regular S1S2 no murmur, 1+ edema at ankles (improved). Clear lungs.   Continue weaning dobutamine, decrease to 1.5 mcg/kg/min today.  Creatinine improved, will try her on low dose digoxin but will need to stop if creatinine rises.  SBP 110s, will increase hydralazine to 25 mg tid and continue Imdur.  Continue spironolactone.  She would be a poor candidate for advanced therapies given minimal mobility and renal dysfunction.   Treat gout with prednisone burst (day 3/3) and colchicine.This is improving.   She needs SNF-level care I think but may not be able to go to SNF based on insurance issues.   Marca Ancona 03/31/2018 10:27 AM

## 2018-03-31 NOTE — Progress Notes (Signed)
Physical Therapy Treatment Patient Details Name: Katherine [name] MRN: [id number] DOB: 1960/11/10 Today's Date: 03/31/2018    History of Present Illness Pt is a 58 y.o. female admitted to [facility] on 03/19/18 with SOB and weakness; worked up for CHF and hypokalemia. Transfer to [facility] 3/5 for further medical management. PMH includes CVA with residual L sided weakness, CHF, CKD, fibromyalgia, h/o seizures, htn, DM, ICD with generator replacement, lupus.    PT Comments    Pt progressing towards her physical therapy goals. Session focused on functional mobility and balance training in conjunction with performing grooming tasks at sink. Pt ambulating 15 feet with rolling walker and min guard assist. Does present as high fall risk based on balance deficits, decreased safety awareness, and decreased gait speed. Pt with cognitive impairments, stating, "Renae Fickle, turn off my phone," when Renae Fickle was not in the room. Pt wanting to go home, recommending HHPT and 24/7 supervision for safety.    Follow Up Recommendations  Home health PT;Supervision/Assistance - 24 hour (pt refusing SNF)     Equipment Recommendations  3in1 (PT)((bariatric))    Recommendations for Other Services OT consult     Precautions / Restrictions Precautions Precautions: Fall Restrictions Weight Bearing Restrictions: No    Mobility  Bed Mobility Overal bed mobility: Needs Assistance Bed Mobility: Supine to Sit     Supine to sit: Supervision     General bed mobility comments: HOB elevated  Transfers Overall transfer level: Needs assistance Equipment used: Rolling walker (2 wheeled) Transfers: Sit to/from Stand Sit to Stand: Min guard         General transfer comment: min guard for safety  Ambulation/Gait Ambulation/Gait assistance: Min guard Gait Distance (Feet): 15 Feet Assistive device: Rolling walker (2 wheeled) Gait Pattern/deviations: Decreased stride length;Wide base of support;Trunk flexed Gait velocity:  decreased   General Gait Details: Pt with slow and mildly unsteady gait, requiring min guard assist for stability   Stairs             Wheelchair Mobility    Modified Rankin (Stroke Patients Only)       Balance Overall balance assessment: Needs assistance Sitting-balance support: No upper extremity supported;Feet supported Sitting balance-Leahy Scale: Fair     Standing balance support: Single extremity supported;Bilateral upper extremity supported Standing balance-Leahy Scale: Poor Standing balance comment: reliant on external support                            Cognition Arousal/Alertness: Awake/alert Behavior During Therapy: WFL for tasks assessed/performed Overall Cognitive Status: No family/caregiver present to determine baseline cognitive functioning Area of Impairment: Attention;Memory;Following commands;Safety/judgement;Problem solving                   Current Attention Level: Sustained Memory: Decreased short-term memory Following Commands: Follows one step commands with increased time Safety/Judgement: Decreased awareness of safety;Decreased awareness of deficits   Problem Solving: Slow processing;Decreased initiation;Requires verbal cues;Requires tactile cues        Exercises      General Comments        Pertinent Vitals/Pain Pain Assessment: Faces Faces Pain Scale: No hurt    Home Living                      Prior Function            PT Goals (current goals can now be found in the care plan section) Acute Rehab PT Goals Patient Stated Goal:  to feel better PT Goal Formulation: With patient Time For Goal Achievement: 04/09/18 Potential to Achieve Goals: Good Progress towards PT goals: Progressing toward goals    Frequency    Min 3X/week      PT Plan Discharge plan needs to be updated;Frequency needs to be updated    Co-evaluation              AM-PAC PT "6 Clicks" Mobility   Outcome  Measure  Help needed turning from your back to your side while in a flat bed without using bedrails?: None Help needed moving from lying on your back to sitting on the side of a flat bed without using bedrails?: A Little Help needed moving to and from a bed to a chair (including a wheelchair)?: A Little Help needed standing up from a chair using your arms (e.g., wheelchair or bedside chair)?: A Little Help needed to walk in hospital room?: A Little Help needed climbing 3-5 steps with a railing? : A Lot 6 Click Score: 18    End of Session Equipment Utilized During Treatment: Gait belt Activity Tolerance: Patient tolerated treatment well Patient left: in chair;with call bell/phone within reach Nurse Communication: Mobility status PT Visit Diagnosis: Other abnormalities of gait and mobility (R26.89);Muscle weakness (generalized) (M62.81);Difficulty in walking, not elsewhere classified (R26.2)     Time: 8882-8003 PT Time Calculation (min) (ACUTE ONLY): 32 min  Charges:  $Therapeutic Activity: 23-37 mins                    Laurina Bustle, [place], DPT Acute Rehabilitation Services Pager [phone] Office [phone]    Vanetta Mulders 03/31/2018, 12:00 PM

## 2018-04-01 DIAGNOSIS — N179 Acute kidney failure, unspecified: Secondary | ICD-10-CM

## 2018-04-01 LAB — CBC WITH DIFFERENTIAL/PLATELET
Abs Immature Granulocytes: 0.09 10*3/uL — ABNORMAL HIGH (ref 0.00–0.07)
Basophils Absolute: 0 10*3/uL (ref 0.0–0.1)
Basophils Relative: 0 %
Eosinophils Absolute: 0 10*3/uL (ref 0.0–0.5)
Eosinophils Relative: 0 %
HCT: 34.7 % — ABNORMAL LOW (ref 36.0–46.0)
Hemoglobin: 11.5 g/dL — ABNORMAL LOW (ref 12.0–15.0)
Immature Granulocytes: 1 %
Lymphocytes Relative: 21 %
Lymphs Abs: 2.5 10*3/uL (ref 0.7–4.0)
MCH: 30.6 pg (ref 26.0–34.0)
MCHC: 33.1 g/dL (ref 30.0–36.0)
MCV: 92.3 fL (ref 80.0–100.0)
MONO ABS: 1.2 10*3/uL — AB (ref 0.1–1.0)
Monocytes Relative: 10 %
Neutro Abs: 8 10*3/uL — ABNORMAL HIGH (ref 1.7–7.7)
Neutrophils Relative %: 68 %
Platelets: 318 10*3/uL (ref 150–400)
RBC: 3.76 MIL/uL — ABNORMAL LOW (ref 3.87–5.11)
RDW: 18.4 % — ABNORMAL HIGH (ref 11.5–15.5)
WBC: 11.8 10*3/uL — ABNORMAL HIGH (ref 4.0–10.5)
nRBC: 0 % (ref 0.0–0.2)

## 2018-04-01 LAB — BASIC METABOLIC PANEL
Anion gap: 12 (ref 5–15)
BUN: 31 mg/dL — AB (ref 6–20)
CO2: 32 mmol/L (ref 22–32)
Calcium: 9.1 mg/dL (ref 8.9–10.3)
Chloride: 90 mmol/L — ABNORMAL LOW (ref 98–111)
Creatinine, Ser: 2.09 mg/dL — ABNORMAL HIGH (ref 0.44–1.00)
GFR calc Af Amer: 30 mL/min — ABNORMAL LOW (ref >60)
GFR calc non Af Amer: 26 mL/min — ABNORMAL LOW (ref >60)
Glucose, Bld: 139 mg/dL — ABNORMAL HIGH (ref 70–99)
Potassium: 3.9 mmol/L (ref 3.5–5.1)
Sodium: 134 mmol/L — ABNORMAL LOW (ref 135–145)

## 2018-04-01 LAB — COOXEMETRY PANEL
Carboxyhemoglobin: 1.4 % (ref 0.5–1.5)
Carboxyhemoglobin: 1.5 % (ref 0.5–1.5)
METHEMOGLOBIN: 1.7 % — AB (ref 0.0–1.5)
Methemoglobin: 0.9 % (ref 0.0–1.5)
O2 Saturation: 49.7 %
O2 Saturation: 56.3 %
Total hemoglobin: 12.1 g/dL (ref 12.0–16.0)
Total hemoglobin: 10 g/dL — ABNORMAL LOW (ref 12.0–16.0)

## 2018-04-01 LAB — GLUCOSE, CAPILLARY
GLUCOSE-CAPILLARY: 84 mg/dL (ref 70–99)
GLUCOSE-CAPILLARY: 92 mg/dL (ref 70–99)
Glucose-Capillary: 139 mg/dL — ABNORMAL HIGH (ref 70–99)
Glucose-Capillary: 91 mg/dL (ref 70–99)

## 2018-04-01 MED ORDER — TORSEMIDE 20 MG PO TABS
80.0000 mg | ORAL_TABLET | Freq: Two times a day (BID) | ORAL | Status: DC
  Administered 2018-04-01 – 2018-04-04 (×8): 80 mg via ORAL
  Filled 2018-04-01 (×9): qty 4

## 2018-04-01 MED ORDER — METOLAZONE 2.5 MG PO TABS
2.5000 mg | ORAL_TABLET | Freq: Once | ORAL | Status: AC
  Administered 2018-04-01: 2.5 mg via ORAL
  Filled 2018-04-01: qty 1

## 2018-04-01 MED ORDER — POTASSIUM CHLORIDE CRYS ER 20 MEQ PO TBCR
40.0000 meq | EXTENDED_RELEASE_TABLET | Freq: Once | ORAL | Status: AC
  Administered 2018-04-01: 40 meq via ORAL
  Filled 2018-04-01: qty 2

## 2018-04-01 MED ORDER — SPIRONOLACTONE 25 MG PO TABS
25.0000 mg | ORAL_TABLET | Freq: Every day | ORAL | Status: DC
  Administered 2018-04-01 – 2018-04-05 (×5): 25 mg via ORAL
  Filled 2018-04-01 (×5): qty 1

## 2018-04-01 NOTE — TOC Initial Note (Signed)
Transition of Care [facility]) - Initial/Assessment Note    Patient Details  Name: Katherine [name] MRN: [id number] Date of Birth: Jul 07, 1960  Transition of Care [facility]) CM/SW Contact:    Cherylann Parr, RN Phone Number: 04/01/2018, 3:36 PM  Clinical Narrative:       PTA from home - SNF recommended however pt is adamant about returning home with [facility].  Pt may discharge home on dobutamine.  CM provided medicare.gov choice list for both HH and DME - pt chose [facility] - agency contacted and tentative referral accepted awaiting orders.  Cm requested orders from attending.  Pt informed both CSW and CM that her significant other and other family will provide 24 hour supervision at home.             Expected Discharge Plan: Home w Home Health Services Barriers to Discharge: Continued Medical Work up   Patient Goals and CMS Choice Patient states their goals for this hospitalization and ongoing recovery are:: ("I want to get home and take care of myself") CMS Medicare.gov Compare Post Acute Care list provided to:: Patient Choice offered to / list presented to : Patient  Expected Discharge Plan and Services Expected Discharge Plan: Home w Home Health Services Discharge Planning Services: CM Consult Post Acute Care Choice: Home Health Living arrangements for the past 2 months: Single Family Home                 DME Arranged: 3-N-1 DME Agency: [facility] HH Arranged: RN, PT HH Agency: [facility]  Prior Living Arrangements/Services Living arrangements for the past 2 months: Single Family Home Lives with:: Self Patient language and need for interpreter reviewed:: Yes Do you feel safe going back to the place where you live?: Yes      Need for Family Participation in Patient Care: Yes (Comment) Care giver support system in place?: Yes (comment)   Criminal Activity/Legal Involvement Pertinent to Current Situation/Hospitalization: No - Comment as needed  Activities  of Daily Living Home Assistive Devices/Equipment: Wheelchair, Environmental consultant (specify type) ADL Screening (condition at time of admission) Patient's cognitive ability adequate to safely complete daily activities?: Yes Is the patient deaf or have difficulty hearing?: No Does the patient have difficulty seeing, even when wearing glasses/contacts?: No Does the patient have difficulty concentrating, remembering, or making decisions?: No Patient able to express need for assistance with ADLs?: Yes Does the patient have difficulty dressing or bathing?: Yes Independently performs ADLs?: No Does the patient have difficulty walking or climbing stairs?: Yes Weakness of Legs: Both Weakness of Arms/Hands: Both  Permission Sought/Granted Permission sought to share information with : Case Manager                Emotional Assessment Appearance:: Appears stated age Attitude/Demeanor/Rapport: Self-Confident, Engaged, Gracious Affect (typically observed): Accepting, Adaptable Orientation: : Oriented to Self, Oriented to Place, Oriented to  Time, Oriented to Situation   Psych Involvement: No (comment)  Admission diagnosis:  HEART FAILURE Patient Active Problem List   Diagnosis Date Noted  . Acute on chronic systolic heart failure (HCC) 03/24/2018  . CHF (congestive heart failure) (HCC) 03/19/2018  . Morbid obesity due to excess calories (HCC) 09/24/2015  . Personal history of noncompliance with medical treatment, presenting hazards to health 02/19/2015  . Acute on chronic combined systolic and diastolic CHF (congestive heart failure) (HCC) 11/17/2012  . Non-ischemic cardiomyopathy (HCC) 10/03/2012  . AICD (automatic cardioverter/defibrillator) present 10/03/2012  . HTN (hypertension) 10/03/2012  . Diabetes mellitus  with stage 3 chronic kidney disease (HCC) 10/03/2012  . Fibromyalgia 10/03/2012  . Dyslipidemia 10/03/2012  . Lupus (HCC) 10/03/2012  . Stroke (HCC) 10/03/2012   PCP:  Alvina Filbert,  MD Pharmacy:   [facility] - [place], [place] - [address] [place] [address] Phone: [phone] Fax: [phone]  [facility] [place] - [place], [place] - [address] [place] [address] Phone: [phone] Fax: [phone]     Social Determinants of Health (SDOH) Interventions    Readmission Risk Interventions 30 Day Unplanned Readmission Risk Score     Admission (Current) from 03/24/2018 in [facility] CV PROGRESSIVE CARE  30 Day Unplanned Readmission Risk Score (%)  27 Filed at 04/01/2018 1200     This score is the patient's risk of an unplanned readmission within 30 days of being discharged (0 -100%). The score is based on dignosis, age, lab data, medications, orders, and past utilization.   Low:  0-14.9   Medium: 15-21.9   High: 22-29.9   Extreme: 30 and above       No flowsheet data found.

## 2018-04-01 NOTE — Progress Notes (Addendum)
Physical Therapy Treatment Patient Details Name: Katherine [name] MRN: [id number] DOB: [birth year] Today's Date: 04/01/2018    History of Present Illness Pt is a 58 y.o. female admitted to [facility] on 03/19/18 with SOB and weakness; worked up for CHF and hypokalemia. Transfer to [facility] 3/5 for further medical management. PMH includes CVA with residual L sided weakness, CHF, CKD, fibromyalgia, h/o seizures, htn, DM, ICD with generator replacement, lupus.    PT Comments    Pt making steady progress but continues to require assist for basic mobility. Pt continues to refuse ST-SNF and feels she will have needed support at home.  If pt changes her mind about SNF I feel she could benefit.  Follow Up Recommendations  Home health PT;Supervision/Assistance - 24 hour(pt refuses SNF)     Equipment Recommendations  None recommended by PT    Recommendations for Other Services       Precautions / Restrictions Precautions Precautions: Fall Restrictions Weight Bearing Restrictions: No    Mobility  Bed Mobility Overal bed mobility: Needs Assistance Bed Mobility: Supine to Sit;Sit to Supine     Supine to sit: Min assist;HOB elevated Sit to supine: Supervision;HOB elevated   General bed mobility comments: Assist to elevate trunk into sitting  Transfers Overall transfer level: Needs assistance Equipment used: Rolling walker (2 wheeled) Transfers: Sit to/from Stand Sit to Stand: Min guard         General transfer comment: min guard for safety  Ambulation/Gait Ambulation/Gait assistance: Min guard Gait Distance (Feet): 20 Feet Assistive device: Rolling walker (2 wheeled) Gait Pattern/deviations: Decreased stride length;Wide base of support;Trunk flexed Gait velocity: decreased Gait velocity interpretation: <1.31 ft/sec, indicative of household ambulator General Gait Details: Pt with unsteady gait requiring assist for safety.   Stairs             Wheelchair Mobility     Modified Rankin (Stroke Patients Only)       Balance Overall balance assessment: Needs assistance Sitting-balance support: No upper extremity supported;Feet supported Sitting balance-Leahy Scale: Fair     Standing balance support: Single extremity supported;During functional activity Standing balance-Leahy Scale: Poor Standing balance comment: walker and min guard for static standing                            Cognition Arousal/Alertness: Awake/alert Behavior During Therapy: WFL for tasks assessed/performed Overall Cognitive Status: No family/caregiver present to determine baseline cognitive functioning Area of Impairment: Attention;Memory;Following commands;Safety/judgement;Problem solving                   Current Attention Level: Sustained Memory: Decreased short-term memory Following Commands: Follows one step commands consistently Safety/Judgement: Decreased awareness of safety;Decreased awareness of deficits   Problem Solving: Slow processing;Requires verbal cues        Exercises      General Comments        Pertinent Vitals/Pain Pain Assessment: Faces Faces Pain Scale: Hurts little more Pain Location: rectum Pain Descriptors / Indicators: Discomfort;Sore Pain Intervention(s): Limited activity within patient's tolerance;Repositioned    Home Living                      Prior Function            PT Goals (current goals can now be found in the care plan section) Acute Rehab PT Goals PT Goal Formulation: With patient Time For Goal Achievement: 04/15/18 Potential to Achieve Goals: Good Progress towards PT goals:  Goals met and updated - see care plan    Frequency    Min 3X/week      PT Plan Current plan remains appropriate    Co-evaluation              AM-PAC PT "6 Clicks" Mobility   Outcome Measure  Help needed turning from your back to your side while in a flat bed without using bedrails?: None Help  needed moving from lying on your back to sitting on the side of a flat bed without using bedrails?: A Little Help needed moving to and from a bed to a chair (including a wheelchair)?: A Little Help needed standing up from a chair using your arms (e.g., wheelchair or bedside chair)?: A Little Help needed to walk in hospital room?: A Little Help needed climbing 3-5 steps with a railing? : A Lot 6 Click Score: 18    End of Session Equipment Utilized During Treatment: Gait belt Activity Tolerance: Patient tolerated treatment well Patient left: with call bell/phone within reach;in bed   PT Visit Diagnosis: Other abnormalities of gait and mobility (R26.89);Muscle weakness (generalized) (M62.81);Difficulty in walking, not elsewhere classified (R26.2)     Time: 6815-9470 PT Time Calculation (min) (ACUTE ONLY): 24 min  Charges:  $Gait Training: 23-37 mins                     [place] Pager [phone] Office [place] 04/01/2018, 5:16 PM

## 2018-04-01 NOTE — Progress Notes (Addendum)
Patient ID: Katherine [name], female   DOB: Aug 15, 1960, 58 y.o.   MRN: [id number]     Advanced Heart Failure Rounding Note  PCP-Cardiologist: Lewayne Bunting, MD   Subjective:    Creatinine relatively stable ~ 2 (2.09 this am)   Coox 49.7% this am on dobutamine 1.5 mcg/kg/min. Drawn early, so repeat pending.   CVP 13-14. I/O negative 1.5 L.   Feeling Ok this am. Remains swollen into her thighs. Says shes "not in a hurry to get home". Just wants to be well.   Objective:   Weight Range: 111.8 kg Body mass index is 33.43 kg/m.   Vital Signs:   Temp:  [97.9 F (36.6 C)-98.9 F (37.2 C)] 98 F (36.7 C) (03/13 0738) Pulse Rate:  [73-83] 79 (03/13 0738) Resp:  [15-19] 16 (03/13 0254) BP: (111-119)/(70-87) 112/77 (03/13 0738) SpO2:  [95 %-99 %] 98 % (03/13 0738) Weight:  [111.8 kg] 111.8 kg (03/13 0536) Last BM Date: 03/25/18  Weight change: Filed Weights   03/30/18 0406 03/31/18 0535 04/01/18 0536  Weight: 119 kg 104.3 kg 111.8 kg   Intake/Output:   Intake/Output Summary (Last 24 hours) at 04/01/2018 0743 Last data filed at 04/01/2018 0738 Gross per 24 hour  Intake 240 ml  Output 1575 ml  Net -1335 ml    Physical Exam    General: NAD  HEENT: Normal Neck: Supple. JVP difficult to assess. Carotids 2+ bilat; no bruits. No thyromegaly or nodule noted. Cor: PMI nondisplaced. RRR, No M/G/R noted Lungs: CTAB, normal effort. Abdomen: Soft, non-tender, non-distended, no HSM. No bruits or masses. +BS  Extremities: No cyanosis, clubbing, or rash. 1-2+ edema into thighs, dependent.  Neuro: Alert & orientedx3, cranial nerves grossly intact. moves all 4 extremities w/o difficulty. Affect pleasant   Telemetry   NSR 70-80s, personally reviewed.   Labs    CBC Recent Labs    03/31/18 0500 04/01/18 0440  WBC 12.1* 11.8*  NEUTROABS 9.6* 8.0*  HGB 11.4* 11.5*  HCT 35.4* 34.7*  MCV 91.5 92.3  PLT 282 318   Basic Metabolic Panel Recent Labs    56/43/32 0245 03/31/18  0500 04/01/18 0440  NA 133* 133* 134*  K 3.9 3.7 3.9  CL 87* 88* 90*  CO2 37* 33* 32  GLUCOSE 176* 201* 139*  BUN 25* 28* 31*  CREATININE 2.12* 2.00* 2.09*  CALCIUM 9.2 8.8* 9.1  MG 2.6* 2.5*  --    Liver Function Tests No results for input(s): AST, ALT, ALKPHOS, BILITOT, PROT, ALBUMIN in the last 72 hours. No results for input(s): LIPASE, AMYLASE in the last 72 hours. Cardiac Enzymes No results for input(s): CKTOTAL, CKMB, CKMBINDEX, TROPONINI in the last 72 hours.  BNP: BNP (last 3 results) Recent Labs    01/12/18 0410 03/19/18 0702 03/24/18 1752  BNP 2,922.0* >4,500.0* 893.2*    ProBNP (last 3 results) No results for input(s): PROBNP in the last 8760 hours.   D-Dimer No results for input(s): DDIMER in the last 72 hours. Hemoglobin A1C No results for input(s): HGBA1C in the last 72 hours. Fasting Lipid Panel No results for input(s): CHOL, HDL, LDLCALC, TRIG, CHOLHDL, LDLDIRECT in the last 72 hours. Thyroid Function Tests No results for input(s): TSH, T4TOTAL, T3FREE, THYROIDAB in the last 72 hours.  Invalid input(s): FREET3  Other results:   Imaging    No results found.   Medications:     Scheduled Medications: . amiodarone  200 mg Oral Q6H  . clopidogrel  75 mg Oral Daily  .  colchicine  0.6 mg Oral Daily  . digoxin  0.0625 mg Oral Daily  . feeding supplement (GLUCERNA SHAKE)  237 mL Oral TID BM  . heparin  5,000 Units Subcutaneous Q8H  . hydrALAZINE  25 mg Oral Q8H  . hydroxychloroquine  200 mg Oral BID  . insulin aspart  0-5 Units Subcutaneous QHS  . insulin aspart  0-9 Units Subcutaneous TID WC  . isosorbide mononitrate  30 mg Oral Daily  . loratadine  10 mg Oral Daily  . mometasone-formoterol  2 puff Inhalation BID  . multivitamin with minerals  1 tablet Oral Daily  . ranolazine  500 mg Oral BID  . sodium chloride flush  10-40 mL Intracatheter Q12H  . spironolactone  12.5 mg Oral Daily  . torsemide  40 mg Oral BID  . venlafaxine XR  150  mg Oral Q breakfast    Infusions: . sodium chloride    . sodium chloride    . DOBUTamine 1.5 mcg/kg/min (03/31/18 0808)    PRN Medications: sodium chloride, Place/Maintain arterial line **AND** sodium chloride, acetaminophen, diphenhydrAMINE, oxyCODONE-acetaminophen, prochlorperazine, sodium chloride flush     Assessment/Plan   1. Acute on chronic systolic CHF: Nonischemic cardiomyopathy.  Echo in 12/19 with EF 20-25%, mildly decreased RV systolic function.  Medtronic ICD.  She had been at [facility] with CHF exacerbation and VT, creatinine rose considerably with attempts at diuresis.  Lactate was elevated. Initial co-ox 38%.  SBP was low at times in the 80s-90s.  Dobutamine was started empirically at 4 mcg/kg/min, BP now stable in 110-120s. Co-ox 50% this morning, lower. Significant amount of diuresis, CVP back up to 13-14  - Continue dobutamine to 1.5 mcg/kg/min. Repeat coox - Increase torsemide to 80 mg BID and give metolazone 2.5 mg once.  - Increase spironolactone to 25 mg daily  - Continue low dose digoxin.  - Continue hydralazine 25 mg TID and Imdur 30. Will follow pressures with dobutamine wean before increasing.  - Continue unna boots 2. AKI: Creatinine up to peak of 3.17 from 1.11.  Suspect cardiorenal in setting of low cardiac output. She was admitted markedly volume overloaded. - Creatinine relatively stable at 2.0 today. Now on po torsemide. Renal US normal.  - Hopefully, improving CO with dobutamine will continue to help creatinine.  3. VT: Patient had 17 episodes of VT with ICD discharges since 1/20.  It is possible that CHF was trigger of VT. Volume status elevated again today.  - Quiescent on amio.  - Currently getting amiodarone 200 mg every 6 hrs per EP.  - She is on ranolazine.  - Beta blockers on hold with suspected low output.  4. H/o CVA: On Plavix. No change.    5. H/o SLE: On hydroxychloroquine. No change.  6. UTI: Cipro. Resolved.  7. Hyponatremia: Stable.  8.  Gout: Suspect flare in shoulders.  - Continue colchicine - Finished 3 days  8. Disposition:  - PT recommending SNF. They have seen. She refuses due to being Medicaid. Will need HHPT/RN. May need home dobutamine.   Volume overloaded on exam. Increase torsemide and add metolazone for today. Follow K.   Length of Stay: [address]  [employer]  04/01/2018, 7:43 AM  Advanced Heart Failure Team Pager [phone] (M-F; 7a - 4p)  Please contact [facility] for night-coverage after hours (4p -7a ) and weekends on [email or web address]  Patient seen with PA, agree with the above note.   This morning, co-ox lower at 50% with CVP up to  13-14.  She feels ok.   JVP10-12cm, regular S1S2 no murmur, 1+ edema at ankles(improved) in unna boots. Clear lungs.   Continue dobutamine at 1.5 mcg/kg/min today, will repeat co-ox this morning.  Would hold off on weaning today, try to slowly drop dose again tomorrow.  Creatinine stable, continue low dose digoxin but will need to stop if creatinine rises.  Continue hydralazine 25 mg tid and continue Imdur.  Can increase spironolactone to 25 mg daily.  She would be a poor candidate for advanced therapies given minimal mobility and renal dysfunction.   Continue to mobilize with PT.   She needs SNF-level care I think but may not be able to go to SNF based on insurance issues.   Marca Ancona 04/01/2018 8:36 AM

## 2018-04-02 LAB — CBC WITH DIFFERENTIAL/PLATELET
Abs Immature Granulocytes: 0.05 10*3/uL (ref 0.00–0.07)
Basophils Absolute: 0 10*3/uL (ref 0.0–0.1)
Basophils Relative: 0 %
EOS PCT: 2 %
Eosinophils Absolute: 0.2 10*3/uL (ref 0.0–0.5)
HCT: 36.1 % (ref 36.0–46.0)
Hemoglobin: 11.5 g/dL — ABNORMAL LOW (ref 12.0–15.0)
Immature Granulocytes: 1 %
Lymphocytes Relative: 25 %
Lymphs Abs: 2 10*3/uL (ref 0.7–4.0)
MCH: 29.4 pg (ref 26.0–34.0)
MCHC: 31.9 g/dL (ref 30.0–36.0)
MCV: 92.3 fL (ref 80.0–100.0)
Monocytes Absolute: 1 10*3/uL (ref 0.1–1.0)
Monocytes Relative: 12 %
Neutro Abs: 4.9 10*3/uL (ref 1.7–7.7)
Neutrophils Relative %: 60 %
Platelets: 334 10*3/uL (ref 150–400)
RBC: 3.91 MIL/uL (ref 3.87–5.11)
RDW: 18.4 % — AB (ref 11.5–15.5)
WBC: 8.1 10*3/uL (ref 4.0–10.5)
nRBC: 0 % (ref 0.0–0.2)

## 2018-04-02 LAB — GLUCOSE, CAPILLARY
Glucose-Capillary: 104 mg/dL — ABNORMAL HIGH (ref 70–99)
Glucose-Capillary: 106 mg/dL — ABNORMAL HIGH (ref 70–99)
Glucose-Capillary: 104 mg/dL — ABNORMAL HIGH (ref 70–99)
Glucose-Capillary: 89 mg/dL (ref 70–99)
Glucose-Capillary: 90 mg/dL (ref 70–99)

## 2018-04-02 LAB — BASIC METABOLIC PANEL
Anion gap: 11 (ref 5–15)
BUN: 29 mg/dL — ABNORMAL HIGH (ref 6–20)
CHLORIDE: 92 mmol/L — AB (ref 98–111)
CO2: 32 mmol/L (ref 22–32)
Calcium: 9.1 mg/dL (ref 8.9–10.3)
Creatinine, Ser: 1.98 mg/dL — ABNORMAL HIGH (ref 0.44–1.00)
GFR calc Af Amer: 32 mL/min — ABNORMAL LOW (ref >60)
GFR calc non Af Amer: 27 mL/min — ABNORMAL LOW (ref >60)
Glucose, Bld: 109 mg/dL — ABNORMAL HIGH (ref 70–99)
Potassium: 3.3 mmol/L — ABNORMAL LOW (ref 3.5–5.1)
Sodium: 135 mmol/L (ref 135–145)

## 2018-04-02 LAB — COOXEMETRY PANEL
Carboxyhemoglobin: 1.5 % (ref 0.5–1.5)
Carboxyhemoglobin: 1.3 % (ref 0.5–1.5)
METHEMOGLOBIN: 1.5 % (ref 0.0–1.5)
Methemoglobin: 0.9 % (ref 0.0–1.5)
O2 Saturation: 55.3 %
O2 Saturation: 51.6 %
Total hemoglobin: 12.1 g/dL (ref 12.0–16.0)
Total hemoglobin: 12.9 g/dL (ref 12.0–16.0)

## 2018-04-02 MED ORDER — DOCUSATE SODIUM 100 MG PO CAPS
100.0000 mg | ORAL_CAPSULE | Freq: Two times a day (BID) | ORAL | Status: DC
  Administered 2018-04-02 – 2018-04-03 (×3): 100 mg via ORAL
  Filled 2018-04-02 (×4): qty 1

## 2018-04-02 MED ORDER — AMIODARONE HCL 200 MG PO TABS
200.0000 mg | ORAL_TABLET | Freq: Two times a day (BID) | ORAL | Status: DC
  Administered 2018-04-02 – 2018-04-05 (×6): 200 mg via ORAL
  Filled 2018-04-02 (×6): qty 1

## 2018-04-02 MED ORDER — ISOSORB DINITRATE-HYDRALAZINE 20-37.5 MG PO TABS
1.0000 | ORAL_TABLET | Freq: Three times a day (TID) | ORAL | Status: DC
  Administered 2018-04-02 – 2018-04-05 (×10): 1 via ORAL
  Filled 2018-04-02 (×10): qty 1

## 2018-04-02 NOTE — Progress Notes (Signed)
Occupational Therapy Treatment Patient Details Name: Katherine [name] MRN: [id number] DOB: 1960-06-13 Today's Date: 04/02/2018    History of present illness Pt is a 58 y.o. female admitted to [facility] on 03/19/18 with SOB and weakness; worked up for CHF and hypokalemia. Transfer to [facility] 3/5 for further medical management. PMH includes CVA with residual L sided weakness, CHF, CKD, fibromyalgia, h/o seizures, htn, DM, ICD with generator replacement, lupus.   OT comments  Pt demonstrating progress toward OT goals. She was able to complete grooming tasks at EOB and simulated toilet transfers (ambulating 10-15 feet) in room. She did require increased encouragement to participate due to pain after bowel movement. However, she did report feeling better after session. Pt continues to demonstrate some confusion throughout session. Continue to recommend SNF level rehabilitation as she requires assistance for all ADL. If she is to return home, she will need home health therapies as well as 24 hour assistance (she reports only intermittent assistance available).    Follow Up Recommendations  SNF;Supervision/Assistance - 24 hour    Equipment Recommendations  Other (comment)(TBD at next venue of care)    Recommendations for Other Services      Precautions / Restrictions Precautions Precautions: Fall Restrictions Weight Bearing Restrictions: No       Mobility Bed Mobility Overal bed mobility: Needs Assistance Bed Mobility: Supine to Sit     Supine to sit: Supervision     General bed mobility comments: Supervision for safety.   Transfers Overall transfer level: Needs assistance Equipment used: Rolling walker (2 wheeled) Transfers: Sit to/from Stand Sit to Stand: Min assist         General transfer comment: Hands on assist for safety. Increased time and effort.     Balance Overall balance assessment: Needs assistance Sitting-balance support: No upper extremity supported;Feet  supported Sitting balance-Leahy Scale: Fair     Standing balance support: Single extremity supported;During functional activity Standing balance-Leahy Scale: Poor Standing balance comment: walker and min guard for static standing                           ADL either performed or assessed with clinical judgement   ADL Overall ADL's : Needs assistance/impaired     Grooming: Set up;Oral care;Sitting   Upper Body Bathing: Supervision/ safety;Sitting               Toilet Transfer: Minimal assistance;Ambulation;RW Toilet Transfer Details (indicate cue type and reason): simulated in room with transfer from bed to chair Toileting- Clothing Manipulation and Hygiene: Moderate assistance;Sit to/from stand Toileting - Clothing Manipulation Details (indicate cue type and reason): Pt able to clean but OT providing assistance for thoroughness     Functional mobility during ADLs: Minimal assistance;Rolling walker General ADL Comments: very short distance (bed to chair) with increased effort, shuffling feet, and decreased activity tolerance     Vision       Perception     Praxis      Cognition Arousal/Alertness: Awake/alert Behavior During Therapy: WFL for tasks assessed/performed Overall Cognitive Status: No family/caregiver present to determine baseline cognitive functioning Area of Impairment: Attention;Memory;Following commands;Safety/judgement;Problem solving                   Current Attention Level: Sustained Memory: Decreased short-term memory Following Commands: Follows one step commands consistently Safety/Judgement: Decreased awareness of safety;Decreased awareness of deficits   Problem Solving: Slow processing;Requires verbal cues General Comments: Fluctuating attention and hyperfocus on bowel movement.  Pt pleasant when re-assured and encouraged.         Exercises     Shoulder Instructions       General Comments VSS    Pertinent Vitals/  Pain       Pain Assessment: Faces Faces Pain Scale: Hurts little more Pain Location: rectum Pain Descriptors / Indicators: Discomfort;Sore Pain Intervention(s): Limited activity within patient's tolerance;Monitored during session;Repositioned  Home Living                                          Prior Functioning/Environment              Frequency  Min 2X/week        Progress Toward Goals  OT Goals(current goals can now be found in the care plan section)  Progress towards OT goals: Progressing toward goals  Acute Rehab OT Goals Patient Stated Goal: to feel better OT Goal Formulation: With patient Time For Goal Achievement: 04/11/18 Potential to Achieve Goals: Good ADL Goals Pt Will Perform Grooming: with supervision;standing Pt Will Perform Lower Body Bathing: with supervision;sit to/from stand Pt Will Perform Upper Body Dressing: with modified independence;sitting Pt Will Perform Lower Body Dressing: with supervision;sit to/from stand Pt Will Transfer to Toilet: with supervision;ambulating Pt Will Perform Toileting - Clothing Manipulation and hygiene: with supervision;sit to/from stand  Plan Discharge plan remains appropriate    Co-evaluation                 AM-PAC OT "6 Clicks" Daily Activity     Outcome Measure   Help from another person eating meals?: None Help from another person taking care of personal grooming?: A Little Help from another person toileting, which includes using toliet, bedpan, or urinal?: A Lot Help from another person bathing (including washing, rinsing, drying)?: A Lot Help from another person to put on and taking off regular upper body clothing?: A Little Help from another person to put on and taking off regular lower body clothing?: A Lot 6 Click Score: 16    End of Session Equipment Utilized During Treatment: Gait belt  OT Visit Diagnosis: Unsteadiness on feet (R26.81);Muscle weakness (generalized)  (M62.81)   Activity Tolerance Patient tolerated treatment well   Patient Left in chair;with call bell/phone within reach;with chair alarm set   Nurse Communication Mobility status;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

## 2018-04-02 NOTE — Progress Notes (Signed)
Patient ID: Katherine [name], female   DOB: 21-Apr-1960, 58 y.o.   MRN: [id number]     Advanced Heart Failure Rounding Note  PCP-Cardiologist: Lewayne Bunting, MD   Subjective:    Good diuresis yesterday, creatinine down to 1.98 and weight down another 8 lbs.  CVP 9 this morning with co-ox 55% on dobutamine 1.5.   Still issues with constipation and hemorrhoids, but breathing better.   Objective:   Weight Range: 108 kg Body mass index is 32.29 kg/m.   Vital Signs:   Temp:  [98.1 F (36.7 C)-98.7 F (37.1 C)] 98.2 F (36.8 C) (03/14 0805) Pulse Rate:  [76-90] 78 (03/14 0805) Resp:  [11-19] 15 (03/14 0805) BP: (104-117)/(69-86) 109/70 (03/14 0805) SpO2:  [92 %-100 %] 96 % (03/14 0805) Weight:  [108 kg] 108 kg (03/14 0500) Last BM Date: 04/01/18  Weight change: Filed Weights   03/31/18 0535 04/01/18 0536 04/02/18 0500  Weight: 104.3 kg 111.8 kg 108 kg   Intake/Output:   Intake/Output Summary (Last 24 hours) at 04/02/2018 0839 Last data filed at 04/02/2018 0806 Gross per 24 hour  Intake 878.06 ml  Output 3225 ml  Net -2346.94 ml    Physical Exam    General: NAD Neck: JVP 8 cm, no thyromegaly or thyroid nodule.  Lungs: Clear to auscultation bilaterally with normal respiratory effort. CV: Lateral PMI.  Heart regular S1/S2, no S3/S4, no murmur.  1+ ankle edema.   Abdomen: Soft, nontender, no hepatosplenomegaly, no distention.  Skin: Intact without lesions or rashes.  Neurologic: Alert and oriented x 3.  Psych: Normal affect. Extremities: No clubbing or cyanosis.  HEENT: Normal.     Telemetry   NSR 70-80s, personally reviewed.   Labs    CBC Recent Labs    04/01/18 0440 04/02/18 0454  WBC 11.8* 8.1  NEUTROABS 8.0* 4.9  HGB 11.5* 11.5*  HCT 34.7* 36.1  MCV 92.3 92.3  PLT 318 334   Basic Metabolic Panel Recent Labs    76/16/07 0500 04/01/18 0440 04/02/18 0454  NA 133* 134* 135  K 3.7 3.9 3.3*  CL 88* 90* 92*  CO2 33* 32 32  GLUCOSE 201* 139* 109*   BUN 28* 31* 29*  CREATININE 2.00* 2.09* 1.98*  CALCIUM 8.8* 9.1 9.1  MG 2.5*  --   --    Liver Function Tests No results for input(s): AST, ALT, ALKPHOS, BILITOT, PROT, ALBUMIN in the last 72 hours. No results for input(s): LIPASE, AMYLASE in the last 72 hours. Cardiac Enzymes No results for input(s): CKTOTAL, CKMB, CKMBINDEX, TROPONINI in the last 72 hours.  BNP: BNP (last 3 results) Recent Labs    01/12/18 0410 03/19/18 0702 03/24/18 1752  BNP 2,922.0* >4,500.0* 893.2*    ProBNP (last 3 results) No results for input(s): PROBNP in the last 8760 hours.   D-Dimer No results for input(s): DDIMER in the last 72 hours. Hemoglobin A1C No results for input(s): HGBA1C in the last 72 hours. Fasting Lipid Panel No results for input(s): CHOL, HDL, LDLCALC, TRIG, CHOLHDL, LDLDIRECT in the last 72 hours. Thyroid Function Tests No results for input(s): TSH, T4TOTAL, T3FREE, THYROIDAB in the last 72 hours.  Invalid input(s): FREET3  Other results:   Imaging    No results found.   Medications:     Scheduled Medications: . amiodarone  200 mg Oral BID  . clopidogrel  75 mg Oral Daily  . colchicine  0.6 mg Oral Daily  . digoxin  0.0625 mg Oral Daily  .  docusate sodium  100 mg Oral BID  . feeding supplement (GLUCERNA SHAKE)  237 mL Oral TID BM  . heparin  5,000 Units Subcutaneous Q8H  . hydroxychloroquine  200 mg Oral BID  . insulin aspart  0-5 Units Subcutaneous QHS  . insulin aspart  0-9 Units Subcutaneous TID WC  . isosorbide-hydrALAZINE  1 tablet Oral TID  . loratadine  10 mg Oral Daily  . mometasone-formoterol  2 puff Inhalation BID  . multivitamin with minerals  1 tablet Oral Daily  . ranolazine  500 mg Oral BID  . sodium chloride flush  10-40 mL Intracatheter Q12H  . spironolactone  25 mg Oral Daily  . torsemide  80 mg Oral BID  . venlafaxine XR  150 mg Oral Q breakfast    Infusions: . sodium chloride    . sodium chloride      PRN Medications: sodium  chloride, Place/Maintain arterial line **AND** sodium chloride, acetaminophen, diphenhydrAMINE, oxyCODONE-acetaminophen, prochlorperazine, sodium chloride flush     Assessment/Plan   1. Acute on chronic systolic CHF: Nonischemic cardiomyopathy.  Echo in 12/19 with EF 20-25%, mildly decreased RV systolic function.  Medtronic ICD.  She had been at [facility] with CHF exacerbation and VT, creatinine rose considerably with attempts at diuresis.  Lactate was elevated. Initial co-ox 38%.  SBP was low at times in the 80s-90s.  Dobutamine was started at 4 mcg/kg/min and she was diuresed with IV Lasix. Weight is down considerably and she is now on po diuretic.  CVP 9 today with co-ox 55%.  - Stop dobutamine today.  - Continue torsemide 80 mg bid.   - Continue spironolactone 25 mg daily  - Continue low dose digoxin.  - Transition to Bidil 1 tab tid.  - Probably not a good LVAD candidate with poor mobility and renal dysfunction. However, could consider if needed if she makes progress with home PT.  2. AKI: Creatinine up to peak of 3.17 from 1.11.  Suspect cardiorenal in setting of low cardiac output. She was admitted markedly volume overloaded. Creatinine now down to 1.98. Follow closely off dobutamine.  3. VT: Patient had 17 episodes of VT with ICD discharges since 1/20. Suspect that CHF was trigger of VT. Has been quiescent in hospital.  - Discussed with Dr. Graciela Husbands, decrease amiodarone to 200 mg bid.  Has a tremor that may be amiodarone-related.   - She is on ranolazine.  - Beta blockers on hold with suspected low output.  4. H/o CVA: On Plavix. No change.    5. H/o SLE: On hydroxychloroquine. No change.  6. UTI: Completed cipro.  7. Gout: Suspect flare in shoulders.  - Continue colchicine - Finished 3 days prednisone 8. Disposition: PT recommending SNF. She refuses due to being Medicaid. Will need HHPT/RN. Possible home Monday if stable (versus ?try for CIR).   Length of Stay: 9  Marca Ancona, MD   04/02/2018, 8:39 AM  Advanced Heart Failure Team Pager [phone] (M-F; 7a - 4p)  Please contact [facility] for night-coverage after hours (4p -7a ) and weekends on [email or web address]

## 2018-04-03 LAB — CBC WITH DIFFERENTIAL/PLATELET
Abs Immature Granulocytes: 0.12 10*3/uL — ABNORMAL HIGH (ref 0.00–0.07)
Basophils Absolute: 0 10*3/uL (ref 0.0–0.1)
Basophils Relative: 0 %
Eosinophils Absolute: 0.2 10*3/uL (ref 0.0–0.5)
Eosinophils Relative: 1 %
HCT: 38.1 % (ref 36.0–46.0)
Hemoglobin: 12.3 g/dL (ref 12.0–15.0)
Immature Granulocytes: 1 %
Lymphocytes Relative: 13 %
Lymphs Abs: 1.6 10*3/uL (ref 0.7–4.0)
MCH: 29.4 pg (ref 26.0–34.0)
MCHC: 32.3 g/dL (ref 30.0–36.0)
MCV: 90.9 fL (ref 80.0–100.0)
Monocytes Absolute: 1.1 10*3/uL — ABNORMAL HIGH (ref 0.1–1.0)
Monocytes Relative: 9 %
NRBC: 0.2 % (ref 0.0–0.2)
Neutro Abs: 9.2 10*3/uL — ABNORMAL HIGH (ref 1.7–7.7)
Neutrophils Relative %: 76 %
Platelets: 387 10*3/uL (ref 150–400)
RBC: 4.19 MIL/uL (ref 3.87–5.11)
RDW: 18.2 % — ABNORMAL HIGH (ref 11.5–15.5)
WBC: 12.2 10*3/uL — ABNORMAL HIGH (ref 4.0–10.5)

## 2018-04-03 LAB — GLUCOSE, CAPILLARY
Glucose-Capillary: 109 mg/dL — ABNORMAL HIGH (ref 70–99)
Glucose-Capillary: 138 mg/dL — ABNORMAL HIGH (ref 70–99)
Glucose-Capillary: 109 mg/dL — ABNORMAL HIGH (ref 70–99)
Glucose-Capillary: 97 mg/dL (ref 70–99)

## 2018-04-03 LAB — BASIC METABOLIC PANEL
Anion gap: 11 (ref 5–15)
BUN: 25 mg/dL — ABNORMAL HIGH (ref 6–20)
CO2: 30 mmol/L (ref 22–32)
CREATININE: 2.06 mg/dL — AB (ref 0.44–1.00)
Calcium: 8.7 mg/dL — ABNORMAL LOW (ref 8.9–10.3)
Chloride: 93 mmol/L — ABNORMAL LOW (ref 98–111)
GFR calc Af Amer: 30 mL/min — ABNORMAL LOW (ref >60)
GFR calc non Af Amer: 26 mL/min — ABNORMAL LOW (ref >60)
Glucose, Bld: 116 mg/dL — ABNORMAL HIGH (ref 70–99)
Potassium: 3 mmol/L — ABNORMAL LOW (ref 3.5–5.1)
Sodium: 134 mmol/L — ABNORMAL LOW (ref 135–145)

## 2018-04-03 LAB — COOXEMETRY PANEL
CARBOXYHEMOGLOBIN: 1.5 % (ref 0.5–1.5)
Methemoglobin: 1.5 % (ref 0.0–1.5)
O2 SAT: 70.3 %
Total hemoglobin: 13.4 g/dL (ref 12.0–16.0)

## 2018-04-03 MED ORDER — POTASSIUM CHLORIDE 10 MEQ/50ML IV SOLN
10.0000 meq | INTRAVENOUS | Status: AC
  Administered 2018-04-03 (×4): 10 meq via INTRAVENOUS
  Filled 2018-04-03 (×3): qty 50

## 2018-04-03 MED ORDER — BISACODYL 5 MG PO TBEC: 5.0000 mg | DELAYED_RELEASE_TABLET | Freq: Every day | ORAL | Status: DC | PRN

## 2018-04-03 MED ORDER — POTASSIUM CHLORIDE CRYS ER 20 MEQ PO TBCR
40.0000 meq | EXTENDED_RELEASE_TABLET | Freq: Two times a day (BID) | ORAL | Status: DC
  Administered 2018-04-03 – 2018-04-04 (×3): 40 meq via ORAL
  Filled 2018-04-03 (×3): qty 2

## 2018-04-03 NOTE — Progress Notes (Signed)
Patient c/o constipation and she states she can't get it out.  Patient OOB to bedside commode and unable to move stool. MD notified for a soap suds enema. Order received and patient received one enema without any results. Another bag given and patient able to move stool around without defecating any stool. Patient was disimpacted with manual technique with good results. Patient stated she felt much better after several large pieces of stool were removed. Will encourage ambulation and laxatives for the remaining hospital stay.

## 2018-04-03 NOTE — Progress Notes (Signed)
Patient ID: Katherine [name], female   DOB: [birth year], 58 y.o.   MRN: [id number]     Advanced Heart Failure Rounding Note  PCP-Cardiologist: Lewayne Bunting, MD   Subjective:    Stable this morning off dobutamine, co-ox 70%.  Good UOP.  Creatinine fairly stable at 2.06.   Able to have bowel movement and feels better.   Objective:   Weight Range: 105.7 kg Body mass index is 31.6 kg/m.   Vital Signs:   Temp:  [97.8 F (36.6 C)-99.2 F (37.3 C)] 98 F (36.7 C) (03/15 0729) Pulse Rate:  [78-90] 90 (03/15 0729) Resp:  [12-19] 19 (03/15 0729) BP: (95-122)/(55-94) 122/82 (03/15 0729) SpO2:  [98 %] 98 % (03/15 0729) Weight:  [105.7 kg] 105.7 kg (03/15 0300) Last BM Date: 04/01/18  Weight change: Filed Weights   04/01/18 0536 04/02/18 0500 04/03/18 0300  Weight: 111.8 kg 108 kg 105.7 kg   Intake/Output:   Intake/Output Summary (Last 24 hours) at 04/03/2018 0846 Last data filed at 04/03/2018 8280 Gross per 24 hour  Intake 529 ml  Output 2350 ml  Net -1821 ml    Physical Exam    General: NAD Neck: No JVD, no thyromegaly or thyroid nodule.  Lungs: Clear to auscultation bilaterally with normal respiratory effort. CV: Lateral PMI.  Heart regular S1/S2, no S3/S4, no murmur.  Trace ankle edema.   Abdomen: Soft, nontender, no hepatosplenomegaly, no distention.  Skin: Intact without lesions or rashes.  Neurologic: Alert and oriented x 3.  Psych: Normal affect. Extremities: No clubbing or cyanosis.  HEENT: Normal.    Telemetry   NSR 80s, personally reviewed.   Labs    CBC Recent Labs    04/02/18 0454 04/03/18 0500  WBC 8.1 12.2*  NEUTROABS 4.9 9.2*  HGB 11.5* 12.3  HCT 36.1 38.1  MCV 92.3 90.9  PLT 334 387   Basic Metabolic Panel Recent Labs    03/49/17 0454 04/03/18 0500  NA 135 134*  K 3.3* 3.0*  CL 92* 93*  CO2 32 30  GLUCOSE 109* 116*  BUN 29* 25*  CREATININE 1.98* 2.06*  CALCIUM 9.1 8.7*   Liver Function Tests No results for input(s): AST,  ALT, ALKPHOS, BILITOT, PROT, ALBUMIN in the last 72 hours. No results for input(s): LIPASE, AMYLASE in the last 72 hours. Cardiac Enzymes No results for input(s): CKTOTAL, CKMB, CKMBINDEX, TROPONINI in the last 72 hours.  BNP: BNP (last 3 results) Recent Labs    01/12/18 0410 03/19/18 0702 03/24/18 1752  BNP 2,922.0* >4,500.0* 893.2*    ProBNP (last 3 results) No results for input(s): PROBNP in the last 8760 hours.   D-Dimer No results for input(s): DDIMER in the last 72 hours. Hemoglobin A1C No results for input(s): HGBA1C in the last 72 hours. Fasting Lipid Panel No results for input(s): CHOL, HDL, LDLCALC, TRIG, CHOLHDL, LDLDIRECT in the last 72 hours. Thyroid Function Tests No results for input(s): TSH, T4TOTAL, T3FREE, THYROIDAB in the last 72 hours.  Invalid input(s): FREET3  Other results:   Imaging    No results found.   Medications:     Scheduled Medications: . amiodarone  200 mg Oral BID  . clopidogrel  75 mg Oral Daily  . colchicine  0.6 mg Oral Daily  . digoxin  0.0625 mg Oral Daily  . docusate sodium  100 mg Oral BID  . feeding supplement (GLUCERNA SHAKE)  237 mL Oral TID BM  . heparin  5,000 Units Subcutaneous Q8H  . hydroxychloroquine  200 mg Oral BID  . insulin aspart  0-5 Units Subcutaneous QHS  . insulin aspart  0-9 Units Subcutaneous TID WC  . isosorbide-hydrALAZINE  1 tablet Oral TID  . loratadine  10 mg Oral Daily  . mometasone-formoterol  2 puff Inhalation BID  . multivitamin with minerals  1 tablet Oral Daily  . ranolazine  500 mg Oral BID  . sodium chloride flush  10-40 mL Intracatheter Q12H  . spironolactone  25 mg Oral Daily  . torsemide  80 mg Oral BID  . venlafaxine XR  150 mg Oral Q breakfast    Infusions: . sodium chloride    . sodium chloride    . potassium chloride 10 mEq (04/03/18 0812)    PRN Medications: sodium chloride, Place/Maintain arterial line **AND** sodium chloride, acetaminophen, bisacodyl,  diphenhydrAMINE, oxyCODONE-acetaminophen, prochlorperazine, sodium chloride flush     Assessment/Plan   1. Acute on chronic systolic CHF: Nonischemic cardiomyopathy.  Echo in 12/19 with EF 20-25%, mildly decreased RV systolic function.  Medtronic ICD.  She had been at [facility] with CHF exacerbation and VT, creatinine rose considerably with attempts at diuresis.  Lactate was elevated. Initial co-ox 38%.  SBP was low at times in the 80s-90s.  Dobutamine was started at 4 mcg/kg/min and she was diuresed with IV Lasix. Weight is down considerably and she is now on po diuretic and off dobutamine. She does not look volume overloaded on exam, co-ox 70% today off dobutamine.  - Continue torsemide 80 mg bid.   - Continue spironolactone 25 mg daily  - Continue low dose digoxin.  - Continue Bidil 1 tab tid.  - Probably not a good LVAD candidate with poor mobility and renal dysfunction. However, could consider if needed if she makes progress PT.  2. AKI: Creatinine up to peak of 3.17 from 1.11.  Suspect cardiorenal in setting of low cardiac output. She was admitted markedly volume overloaded. Creatinine now steady at 2.06. Follow closely off dobutamine.  3. VT: Patient had 17 episodes of VT with ICD discharges since 1/20. Suspect that CHF was trigger of VT. Has been quiescent in hospital.  - Discussed with Dr. Graciela Husbands, decreased amiodarone to 200 mg bid.  Has a tremor that may be amiodarone-related.   - She is on ranolazine.  - Beta blockers on hold with suspected low output.  4. H/o CVA: On Plavix. No change.    5. H/o SLE: On hydroxychloroquine. No change.  6. UTI: Completed cipro.  7. Gout: Suspect flare in shoulders.  - Continue colchicine - Finished 3 days prednisone 8. Disposition: PT recommending SNF but difficulty arranging this due to Medicaid.  Will as Child psychotherapist to see if any option and will see if she could go to inpatient rehab.    Length of Stay: 10  Marca Ancona, MD  04/03/2018, 8:46 AM   Advanced Heart Failure Team Pager [phone] (M-F; 7a - 4p)  Please contact [facility] for night-coverage after hours (4p -7a ) and weekends on [email or web address]

## 2018-04-03 NOTE — Progress Notes (Signed)
Occupational Therapy Treatment Patient Details Name: JENEANE REBACK MRN: [id number] DOB: 1960/10/17 Today's Date: 04/03/2018    History of present illness Pt is a 58 y.o. female admitted to [facility] on 03/19/18 with SOB and weakness; worked up for CHF and hypokalemia. Transfer to [facility] 3/5 for further medical management. PMH includes CVA with residual L sided weakness, CHF, CKD, fibromyalgia, h/o seizures, htn, DM, ICD with generator replacement, lupus.   OT comments  Pt. Agreeable to participation in skilled OT.  Able to completed LB dressing and toileting tasks.  Cues for rest breaks and sequencing for safety during transfers.    Follow Up Recommendations  SNF;Supervision/Assistance - 24 hour    Equipment Recommendations  Other (comment)    Recommendations for Other Services      Precautions / Restrictions Precautions Precautions: Fall Restrictions Weight Bearing Restrictions: No       Mobility Bed Mobility               General bed mobility comments: seated in recliner at beginning and end of session  Transfers Overall transfer level: Needs assistance Equipment used: Rolling walker (2 wheeled) Transfers: Sit to/from [employer] Sit to Stand: Min assist Stand pivot transfers: Min guard       General transfer comment: Hands on assist for safety, cues for hand placement and sequencing     Balance                                           ADL either performed or assessed with clinical judgement   ADL Overall ADL's : Needs assistance/impaired                     Lower Body Dressing: Set up;Sitting/lateral leans;Cueing for compensatory techniques Lower Body Dressing Details (indicate cue type and reason): pt. able to cross each leg over knee to don sneakers. max cues for initiation and completion of rest breaks in between each shoe. HR fluctuating from 86-88 with activity.   Toilet Transfer: Radiographer, therapeutic Manipulation Details (indicate cue type and reason): no demonstrated during session as pt. did not actually use the b.room, only transfer     Functional mobility during ADLs: Minimal assistance;Rolling walker General ADL Comments: pt. able to describe how she was feeling when needing a rest break prior to admit: (dizzy, "feel it in my chest") but unable to initiate rest breaks without cues, and once initiated was unable to sustatin the full rest break without attempting to do other tasks.      Vision       Perception     Praxis      Cognition Arousal/Alertness: Awake/alert   Overall Cognitive Status: No family/caregiver present to determine baseline cognitive functioning (refer to general comments for additional details)                                  General Comments: Fluctuating attention during tasks, Pt pleasant when re-assured and encouraged.         Exercises     Shoulder Instructions       General Comments  pt. Stated without prompting or incident "im sorry if I get an attitude sometimes, but when im trying to think its like hold on a minute please".  reassured patient she had not gotten an attitude with me during session.  Reviewed she had been a pleasure to work with.  Pt. Laughed and smiled and said she had enjoyed our session also.      Pertinent Vitals/ Pain       Pain Assessment: No/denies pain  Home Living                                          Prior Functioning/Environment              Frequency  Min 2X/week        Progress Toward Goals  OT Goals(current goals can now be found in the care plan section)  Progress towards OT goals: Progressing toward goals     Plan Discharge plan remains appropriate    Co-evaluation                 AM-PAC OT "6 Clicks" Daily Activity     Outcome Measure   Help from another person eating meals?: None Help from another person taking care  of personal grooming?: A Little Help from another person toileting, which includes using toliet, bedpan, or urinal?: A Lot Help from another person bathing (including washing, rinsing, drying)?: A Lot Help from another person to put on and taking off regular upper body clothing?: A Little Help from another person to put on and taking off regular lower body clothing?: A Lot 6 Click Score: 16    End of Session Equipment Utilized During Treatment: Gait belt;Rolling walker  OT Visit Diagnosis: Unsteadiness on feet (R26.81);Muscle weakness (generalized) (M62.81)   Activity Tolerance Patient tolerated treatment well   Patient Left in chair;with call bell/phone within reach;Other (comment)(requested to leave her sneakers on for cont. transfers and mobility throughout the day)   Nurse Communication          Time: [phone] OT Time Calculation (min): 19 min  Charges: OT General Charges $OT Visit: 1 Visit OT Treatments $Self Care/Home Management : 8-22 mins  Robet Leu, COTA/L 04/03/2018, 10:02 AM

## 2018-04-04 DIAGNOSIS — I6930 Unspecified sequelae of cerebral infarction: Secondary | ICD-10-CM

## 2018-04-04 DIAGNOSIS — E1169 Type 2 diabetes mellitus with other specified complication: Secondary | ICD-10-CM

## 2018-04-04 DIAGNOSIS — M109 Gout, unspecified: Secondary | ICD-10-CM

## 2018-04-04 DIAGNOSIS — E669 Obesity, unspecified: Secondary | ICD-10-CM

## 2018-04-04 DIAGNOSIS — I5022 Chronic systolic (congestive) heart failure: Secondary | ICD-10-CM

## 2018-04-04 DIAGNOSIS — E876 Hypokalemia: Secondary | ICD-10-CM

## 2018-04-04 DIAGNOSIS — E119 Type 2 diabetes mellitus without complications: Secondary | ICD-10-CM

## 2018-04-04 LAB — BASIC METABOLIC PANEL
ANION GAP: 12 (ref 5–15)
BUN: 22 mg/dL — ABNORMAL HIGH (ref 6–20)
CO2: 26 mmol/L (ref 22–32)
Calcium: 8.4 mg/dL — ABNORMAL LOW (ref 8.9–10.3)
Chloride: 99 mmol/L (ref 98–111)
Creatinine, Ser: 1.98 mg/dL — ABNORMAL HIGH (ref 0.44–1.00)
GFR calc Af Amer: 32 mL/min — ABNORMAL LOW (ref >60)
GFR calc non Af Amer: 27 mL/min — ABNORMAL LOW (ref >60)
GLUCOSE: 98 mg/dL (ref 70–99)
Potassium: 3.2 mmol/L — ABNORMAL LOW (ref 3.5–5.1)
Sodium: 137 mmol/L (ref 135–145)

## 2018-04-04 LAB — CBC WITH DIFFERENTIAL/PLATELET
ABS IMMATURE GRANULOCYTES: 0.15 10*3/uL — AB (ref 0.00–0.07)
Basophils Absolute: 0.1 10*3/uL (ref 0.0–0.1)
Basophils Relative: 1 %
Eosinophils Absolute: 0.3 10*3/uL (ref 0.0–0.5)
Eosinophils Relative: 3 %
HCT: 36.8 % (ref 36.0–46.0)
Hemoglobin: 12.4 g/dL (ref 12.0–15.0)
IMMATURE GRANULOCYTES: 2 %
Lymphocytes Relative: 29 %
Lymphs Abs: 2.8 10*3/uL (ref 0.7–4.0)
MCH: 30.8 pg (ref 26.0–34.0)
MCHC: 33.7 g/dL (ref 30.0–36.0)
MCV: 91.5 fL (ref 80.0–100.0)
Monocytes Absolute: 1 10*3/uL (ref 0.1–1.0)
Monocytes Relative: 10 %
Neutro Abs: 5.4 10*3/uL (ref 1.7–7.7)
Neutrophils Relative %: 55 %
Platelets: 430 10*3/uL — ABNORMAL HIGH (ref 150–400)
RBC: 4.02 MIL/uL (ref 3.87–5.11)
RDW: 18.6 % — ABNORMAL HIGH (ref 11.5–15.5)
WBC: 9.7 10*3/uL (ref 4.0–10.5)
nRBC: 0 % (ref 0.0–0.2)

## 2018-04-04 LAB — GLUCOSE, CAPILLARY
Glucose-Capillary: 99 mg/dL (ref 70–99)
Glucose-Capillary: 124 mg/dL — ABNORMAL HIGH (ref 70–99)
Glucose-Capillary: 101 mg/dL — ABNORMAL HIGH (ref 70–99)
Glucose-Capillary: 90 mg/dL (ref 70–99)

## 2018-04-04 LAB — COOXEMETRY PANEL
Carboxyhemoglobin: 1.9 % — ABNORMAL HIGH (ref 0.5–1.5)
Methemoglobin: 1 % (ref 0.0–1.5)
O2 Saturation: 71.2 %
Total hemoglobin: 11.2 g/dL — ABNORMAL LOW (ref 12.0–16.0)

## 2018-04-04 MED ORDER — POTASSIUM CHLORIDE CRYS ER 20 MEQ PO TBCR
40.0000 meq | EXTENDED_RELEASE_TABLET | Freq: Once | ORAL | Status: AC
  Administered 2018-04-04: 40 meq via ORAL
  Filled 2018-04-04: qty 2

## 2018-04-04 NOTE — Progress Notes (Signed)
Pt says she IS interested in CIR as long as it does not affect her monthly disability check. I spoke with Pam, PA in CIR. She will let [facility] know. They will check her insurance and discuss with her. No beds currently available. Updated case manager, MD, and patient.   Alford Highland, NP

## 2018-04-04 NOTE — Progress Notes (Signed)
Physical Therapy Treatment Patient Details Name: Katherine [name] MRN: [id number] DOB: Aug 29, 1960 Today's Date: 04/04/2018    History of Present Illness Pt is a 58 y.o. female admitted to [facility] on 03/19/18 with SOB and weakness; worked up for CHF and hypokalemia. Transfer to [facility] 3/5 for further medical management. PMH includes CVA with residual L sided weakness, CHF, CKD, fibromyalgia, h/o seizures, htn, DM, ICD with generator replacement, lupus.    PT Comments    Pt received in bed, agreeable to participation in therapy. She required supervision bed mobility, min assist sit to stand and min/min guard assist ambulation 50 feet with RW. LOB x 1 during ambulation requiring min assist to recover. Pt fatigues quickly. She was positioned in recliner with feet elevated at end of session. Discharge recommendations updated to CIR. Pt reports her boyfriend is able to provide 24-hour assist upon return home.    Follow Up Recommendations  CIR     Equipment Recommendations  None recommended by PT    Recommendations for Other Services       Precautions / Restrictions Precautions Precautions: Fall Restrictions Weight Bearing Restrictions: No    Mobility  Bed Mobility Overal bed mobility: Needs Assistance Bed Mobility: Supine to Sit     Supine to sit: Supervision;HOB elevated     General bed mobility comments: +rail, increased time and effort  Transfers Overall transfer level: Needs assistance Equipment used: Rolling walker (2 wheeled) Transfers: Sit to/from Stand Sit to Stand: Min assist;From elevated surface         General transfer comment: cues for hand placement, multi attempts to stand using momentum  Ambulation/Gait Ambulation/Gait assistance: Min guard;Min assist Gait Distance (Feet): 50 Feet Assistive device: Rolling walker (2 wheeled) Gait Pattern/deviations: Step-through pattern;Decreased stride length;Wide base of support Gait velocity: decreased Gait velocity  interpretation: <1.31 ft/sec, indicative of household ambulator General Gait Details: unsteady, mild steppage gait which increases with fatigue. Min guard assist straight, level surfaces. LOB during turn requiring min assist to maintain balance.   Stairs             Wheelchair Mobility    Modified Rankin (Stroke Patients Only)       Balance Overall balance assessment: Needs assistance Sitting-balance support: No upper extremity supported;Feet supported Sitting balance-Leahy Scale: Fair Sitting balance - Comments: steady sitting reaching outside BOS   Standing balance support: Bilateral upper extremity supported;During functional activity Standing balance-Leahy Scale: Poor Standing balance comment: reliant on RW                            Cognition Arousal/Alertness: Awake/alert Behavior During Therapy: WFL for tasks assessed/performed Overall Cognitive Status: Within Functional Limits for tasks assessed                                        Exercises      General Comments General comments (skin integrity, edema, etc.): VSS      Pertinent Vitals/Pain Pain Assessment: No/denies pain    Home Living                      Prior Function            PT Goals (current goals can now be found in the care plan section) Acute Rehab PT Goals Patient Stated Goal: get stronger PT Goal Formulation: With patient Time  For Goal Achievement: 04/15/18 Potential to Achieve Goals: Good Progress towards PT goals: Progressing toward goals    Frequency    Min 3X/week      PT Plan Discharge plan needs to be updated    Co-evaluation              AM-PAC PT "6 Clicks" Mobility   Outcome Measure  Help needed turning from your back to your side while in a flat bed without using bedrails?: None Help needed moving from lying on your back to sitting on the side of a flat bed without using bedrails?: A Little Help needed moving to  and from a bed to a chair (including a wheelchair)?: A Little Help needed standing up from a chair using your arms (e.g., wheelchair or bedside chair)?: A Little Help needed to walk in hospital room?: A Little Help needed climbing 3-5 steps with a railing? : A Lot 6 Click Score: 18    End of Session Equipment Utilized During Treatment: Gait belt Activity Tolerance: Patient tolerated treatment well Patient left: in chair;with call bell/phone within reach;with chair alarm set Nurse Communication: Mobility status PT Visit Diagnosis: Other abnormalities of gait and mobility (R26.89);Muscle weakness (generalized) (M62.81);Difficulty in walking, not elsewhere classified (R26.2)     Time: 5784-6962 PT Time Calculation (min) (ACUTE ONLY): 26 min  Charges:  $Gait Training: 23-37 mins                     Aida Raider, [place]  Office # [phone] Pager [phone]    Ilda Foil 04/04/2018, 12:49 PM

## 2018-04-04 NOTE — Progress Notes (Addendum)
Patient ID: Katherine [name], female   DOB: [birth year], 58 y.o.   MRN: [id number]     Advanced Heart Failure Rounding Note  PCP-Cardiologist: Lewayne Bunting, MD   Subjective:    Coox 72% off dobutamine. Creatinine improving 2.06 -> 1.98. Weight down 6 lbs on PO torsemide. Very little UOP charted. CVP 9.   Denies CP or SOB. Up to bathroom with 1 assist yesterday. CIR PA Rinaldo Cloud in with her today. Says she urinated a lot yesterday with torsemide.   Objective:   Weight Range: 102.6 kg Body mass index is 30.68 kg/m.   Vital Signs:   Temp:  [97.9 F (36.6 C)-98.3 F (36.8 C)] 98.2 F (36.8 C) (03/16 0300) Pulse Rate:  [82-90] 90 (03/16 0300) Resp:  [14-18] 17 (03/16 0500) BP: (112-127)/(73-91) 112/91 (03/16 0300) SpO2:  [96 %-98 %] 98 % (03/16 0300) Weight:  [102.6 kg] 102.6 kg (03/16 0300) Last BM Date: 04/01/18  Weight change: Filed Weights   04/02/18 0500 04/03/18 0300 04/04/18 0300  Weight: 108 kg 105.7 kg 102.6 kg   Intake/Output:   Intake/Output Summary (Last 24 hours) at 04/04/2018 0755 Last data filed at 04/04/2018 0000 Gross per 24 hour  Intake 500 ml  Output 300 ml  Net 200 ml    Physical Exam    General: No resp difficulty. Obese.  HEENT: Normal Neck: Supple. JVP difficult, but does not look elevated. Carotids 2+ bilat; no bruits. No thyromegaly or nodule noted. Cor: PMI lateral displaced. RRR, No M/G/R noted Lungs: CTAB, normal effort. Abdomen: Soft, non-tender, non-distended, no HSM. No bruits or masses. +BS  Extremities: No cyanosis, clubbing, or rash. R and LLE no edema. BLE UNNA boots.  Neuro: Alert & orientedx3, cranial nerves grossly intact. moves all 4 extremities w/o difficulty. Affect pleasant   Telemetry   NSR 80-90s, personally reviewed.   Labs    CBC Recent Labs    04/03/18 0500 04/04/18 0556  WBC 12.2* 9.7  NEUTROABS 9.2* 5.4  HGB 12.3 12.4  HCT 38.1 36.8  MCV 90.9 91.5  PLT 387 430*   Basic Metabolic Panel Recent Labs   04/03/18 0500 04/04/18 0556  NA 134* 137  K 3.0* 3.2*  CL 93* 99  CO2 30 26  GLUCOSE 116* 98  BUN 25* 22*  CREATININE 2.06* 1.98*  CALCIUM 8.7* 8.4*   Liver Function Tests No results for input(s): AST, ALT, ALKPHOS, BILITOT, PROT, ALBUMIN in the last 72 hours. No results for input(s): LIPASE, AMYLASE in the last 72 hours. Cardiac Enzymes No results for input(s): CKTOTAL, CKMB, CKMBINDEX, TROPONINI in the last 72 hours.  BNP: BNP (last 3 results) Recent Labs    01/12/18 0410 03/19/18 0702 03/24/18 1752  BNP 2,922.0* >4,500.0* 893.2*    ProBNP (last 3 results) No results for input(s): PROBNP in the last 8760 hours.   D-Dimer No results for input(s): DDIMER in the last 72 hours. Hemoglobin A1C No results for input(s): HGBA1C in the last 72 hours. Fasting Lipid Panel No results for input(s): CHOL, HDL, LDLCALC, TRIG, CHOLHDL, LDLDIRECT in the last 72 hours. Thyroid Function Tests No results for input(s): TSH, T4TOTAL, T3FREE, THYROIDAB in the last 72 hours.  Invalid input(s): FREET3  Other results:   Imaging    No results found.   Medications:     Scheduled Medications: . amiodarone  200 mg Oral BID  . clopidogrel  75 mg Oral Daily  . colchicine  0.6 mg Oral Daily  . digoxin  0.0625 mg Oral  Daily  . docusate sodium  100 mg Oral BID  . feeding supplement (GLUCERNA SHAKE)  237 mL Oral TID BM  . heparin  5,000 Units Subcutaneous Q8H  . hydroxychloroquine  200 mg Oral BID  . insulin aspart  0-5 Units Subcutaneous QHS  . insulin aspart  0-9 Units Subcutaneous TID WC  . isosorbide-hydrALAZINE  1 tablet Oral TID  . loratadine  10 mg Oral Daily  . mometasone-formoterol  2 puff Inhalation BID  . multivitamin with minerals  1 tablet Oral Daily  . potassium chloride  40 mEq Oral BID  . ranolazine  500 mg Oral BID  . sodium chloride flush  10-40 mL Intracatheter Q12H  . spironolactone  25 mg Oral Daily  . torsemide  80 mg Oral BID  . venlafaxine XR  150 mg  Oral Q breakfast    Infusions: . sodium chloride    . sodium chloride      PRN Medications: sodium chloride, Place/Maintain arterial line **AND** sodium chloride, acetaminophen, bisacodyl, diphenhydrAMINE, oxyCODONE-acetaminophen, prochlorperazine, sodium chloride flush     Assessment/Plan   1. Acute on chronic systolic CHF: Nonischemic cardiomyopathy.  Echo in 12/19 with EF 20-25%, mildly decreased RV systolic function.  Medtronic ICD.  She had been at [facility] with CHF exacerbation and VT, creatinine rose considerably with attempts at diuresis.  Lactate was elevated. Initial co-ox 38%.  SBP was low at times in the 80s-90s.  Dobutamine was started at 4 mcg/kg/min and she was diuresed with IV Lasix. Weight is down considerably and she is now on po diuretic and off dobutamine. She does not look volume overloaded on exam, co-ox 71% today off dobutamine. Weight continues to trend down. Down 6 more lbs overnight.  - Continue torsemide 80 mg bid.   - Continue spironolactone 25 mg daily  - Continue low dose digoxin.  - Continue Bidil 1 tab tid. SBP 110-120s. Will not increase today.  - Probably not a good LVAD candidate with poor mobility and renal dysfunction. However, could consider if needed if she makes progress PT.  2. AKI: Creatinine up to peak of 3.17 from 1.11.  Suspect cardiorenal in setting of low cardiac output. She was admitted markedly volume overloaded. Creatinine improving 1.98. Follow closely off dobutamine.  3. VT: Patient had 17 episodes of VT with ICD discharges since 1/20. Suspect that CHF was trigger of VT. Has been quiescent in hospital. No change.  - Discussed with Dr. Graciela Husbands, decreased amiodarone to 200 mg bid.  Has a tremor that may be amiodarone-related.   - She is on ranolazine.  - Beta blockers on hold with low output.  4. H/o CVA: On Plavix. No change.   5. H/o SLE: On hydroxychloroquine. No change.  6. UTI: Completed cipro. No change.  7. Gout: Suspect flare in  shoulders.  - Continue colchicine - Finished 3 days prednisone. Resolved.  8. Disposition: PT recommending SNF but difficulty arranging this due to Medicaid.  Will as Child psychotherapist to see if any option and will see if she could go to inpatient rehab.  CSW consult in. Not yet seen today. CIR seeing her today.   Length of Stay: [address], NP  04/04/2018, 7:55 AM  Advanced Heart Failure Team Pager [phone] (M-F; 7a - 4p)  Please contact [facility] for night-coverage after hours (4p -7a ) and weekends on [email or web address]  Patient seen with NP, agree with the above note.   CVP 9 this morning, creatinine lower at 1.98.  Weight continues to trend down.  She feels good overall, getting more mobile.   Co-ox 72% off dobutamine.   On exam, JVP 8 cm, regular S1S2, no edema.   Continue current torsemide with stable creatinine and CVP 9.  She can stay off dobutamine.  Continue current Bidil, digoxin, and spironolactone. She will need additional KCl.    I think she is ready to leave the hospital.  Insurance makes SNF not an option.  Will see if CIR is a possibility. If not, will have to go home with home health/PT/OT.   Marca Ancona 04/04/2018 9:14 AM

## 2018-04-04 NOTE — Progress Notes (Signed)
IP rehab admissions - I met with patient.  She tells me that she knows that she needs rehab and is now willing to come to rehab.  I gave her rehab booklets to review.  I will follow up in am to determine bed availability for tomorrow.  Call me for questions.  [phone]

## 2018-04-04 NOTE — Progress Notes (Signed)
Nutrition Follow-up  DOCUMENTATION CODES:   Morbid obesity  INTERVENTION:   - Continue Glucerna Shake po TID, each supplement provides 220 kcal and 10 grams of protein  - Continue Magic cup TID with meals, each supplement provides 290 kcal and 9 grams of protein  - Continue MVI with minerals daily  - Took pt's food preferences and added items to meal trays via HealthTouch diet software  NUTRITION DIAGNOSIS:   Inadequate oral intake related to decreased appetite as evidenced by meal completion < 50%.  Ongoing  GOAL:   Patient will meet greater than or equal to 90% of their needs  Progressing  MONITOR:   PO intake, Supplement acceptance, Labs, Weight trends, Skin, I & O's  REASON FOR ASSESSMENT:   Malnutrition Screening Tool    ASSESSMENT:   Katherine [name] is a 58 y.o. female with history of chronic systolic heart failure secondary to nonischemic cardiomyopathy (LVEF 20 to 25%) status post ICD, ventricular tachycardia, CKD stage III, diabetes mellitus, hypertension, and hyperlipidemia, who has been transferred from [facility] for further evaluation and management of acute on chronic systolic heart failure.  Weight continues to trend down. Pt remains on Torsemide 80 mg BID.  Spoke with pt at bedside. Pt in good spirits and states she is planning to go to rehab after this because she knows she needs to get more mobile prior to going home. Pt feels she is getting better overall.  Pt reports that she does not like the food ("it all has the same flavor") which is why she is not eating much. Pt states that she eats "some" at each meal and has experienced vomiting but not regularly. Pt endorses consuming Glucerna shakes. RD offered to have pt's meals include items she likes. Pt lists cereal, milk, and grapes as favorite items. RD ordered these items via HealthTouch diet software.  Per Heart Failure team notes, pt is not an LVAD candidate.  Medications reviewed and include:  Colace, Glucerna TID, SSI, MVI with minerals, K-dur 40 mEq BID  Labs reviewed: potassium 3.2 (L), BUN 22 (H), creatinine 1.98 (H) CBG's: 124, 99, 97, 109 x 24 hours  UOP: 500 ml x 24 hours I/O's: -41.8 L since admit  Diet Order:   Diet Order            Diet heart healthy/carb modified Room service appropriate? Yes; Fluid consistency: Thin; Fluid restriction: 2000 mL Fluid  Diet effective now              EDUCATION NEEDS:   Education needs have been addressed  Skin:  Skin Assessment: Reviewed RN Assessment  Last BM:  04/04/18 smear type 6  Height:   Ht Readings from Last 1 Encounters:  03/24/18 6' (1.829 m)    Weight:   Wt Readings from Last 1 Encounters:  04/04/18 102.6 kg    Ideal Body Weight:  80.9 kg  BMI:  Body mass index is 30.68 kg/m.  Estimated Nutritional Needs:   Kcal:  2000-2200  Protein:  105-120 grams  Fluid:  2.2-.2.4 L    Earma Reading, MS, RD, LDN Inpatient Clinical Dietitian Pager: [phone] Weekend/After Hours: [phone]

## 2018-04-04 NOTE — Progress Notes (Signed)
Orthopedic Tech Progress Note Patient Details:  Katherine [name] [birth year] [id number]  Ortho Devices Type of Ortho Device: Radio broadcast assistant Ortho Device/Splint Location: Bilateral unna boots Ortho Device/Splint Interventions: Application   Post Interventions Patient Tolerated: Well Instructions Provided: Care of device   Saul Fordyce 04/04/2018, 4:52 PM

## 2018-04-04 NOTE — TOC Progression Note (Addendum)
Transition of Care [facility]) - Progression Note    Patient Details  Name: Katherine [name] MRN: [id number] Date of Birth: 01-01-1961  [facility] CM/SW Contact  Cherylann Parr, RN Phone Number: 04/04/2018, 7:46 AM  Clinical Narrative:  04/04/2018 0746: Per previous CM note discharge plan was Home with HH due to financial constraints associated with surrendering medicaid check.  On 3/15  Attending requested CIR consideration - CM text paged PT assigned to pt today to request/gain clarity of appropriateness with CIR as potential discharge plan based on pts current mobility.  CM will continue to follow  1431: Update:  CIR recommended - pt confirms acceptance of CIR.  CIR liaison to review insurance benefits   Expected Discharge Plan: Home w Home Health Services Barriers to Discharge: Continued Medical Work up  Expected Discharge Plan and Services Expected Discharge Plan: Home w Home Health Services Discharge Planning Services: CM Consult Post Acute Care Choice: Home Health Living arrangements for the past 2 months: Single Family Home                 DME Arranged: 3-N-1 DME Agency: [facility] HH Arranged: RN, PT HH Agency: [facility]   Social Determinants of Health (SDOH) Interventions    Readmission Risk Interventions 30 Day Unplanned Readmission Risk Score     Admission (Current) from 03/24/2018 in [place] 2C CV PROGRESSIVE CARE  30 Day Unplanned Readmission Risk Score (%)  29 Filed at 04/04/2018 0400     This score is the patient's risk of an unplanned readmission within 30 days of being discharged (0 -100%). The score is based on dignosis, age, lab data, medications, orders, and past utilization.   Low:  0-14.9   Medium: 15-21.9   High: 22-29.9   Extreme: 30 and above       No flowsheet data found.

## 2018-04-04 NOTE — Discharge Summary (Signed)
Advanced Heart Failure Discharge Note  Discharge Summary   Patient ID: Katherine [name] MRN: [id number], DOB/AGE: 04/30/1960 58 y.o. Admit date: 03/24/2018 D/C date:     04/05/2018    Primary Discharge Diagnoses:  1. A/C systolic HF 2. AKI 3. VT 4. History of CVA 5. History of SLE 6. UTI 7. Gout 8. Deconditioning  Hospital Course:  Katherine [name] is a 58 y.o. female with history of nonischemic cardiomyopathy, CVA, and VT was transferred from [facility] for further evaluation of heart failure.  Patient has long history of NICM, diagnosed in 2012.  At that time, cath showed no coronary disease and cardiac MRI showed no infiltrative disease (no LGE) with EF 28%.  Echo in 12/19 showed moderate LV dilation with EF 20-25%, mildly decreased RV systolic function.   She was admitted to [facility] with SOB. Device interrogation showed 17 appropriated shocks since 02/08/18. Dr Graciela Husbands started her on oral amiodarone. She was transferred to [facility] with concern for low output with worsening renal function with diuresis attempts. She was started on dobutamine with initial coox 38%. She was diuresed with IV lasix and transitioned to torsemide 80 mg BID. HF medications were optimized as able. No ARB/ACEi/ARNI due to AKI. Renal function monitored closely and improved with diuresis and inotrope support. Dobutamine was weaned with stable co-ox and she was transitioned to oral diuretics. She had no further VT. Problem based review of admission is below.   1. Acute on chronic systolic CHF: Nonischemic cardiomyopathy. Echo in 12/19 with EF 20-25%, mildly decreased RV systolic function. Medtronic ICD. She had been at [facility] with CHF exacerbation and VT, creatinine rose considerably with attempts at diuresis. Lactate was elevated. Initial co-ox 38%. Dobutamine was started at 4 mcg/kg/min and she was diuresed with IV Lasix. Dobutamine weaned off. Weight and co-ox remained stable off dobutamine and on oral diuretics.  - Continue  torsemide 80 mg daily - Continue spironolactone 25 mg daily  - Continue low dose digoxin.  - Continue Bidil 1 tab tid.  - Probably not a good LVAD candidate with poor mobility and renal dysfunction. However, could consider if needed if she makes progress PT.  2. AKI: Creatinine up to peak of 3.17 from 1.11. Suspect cardiorenal in setting of low cardiac output. She was admitted markedly volume overloaded. Creatinine monitored closely and improved. Will continue to monitor in CIR.  3. VT: Patient had 17 episodes of VT with ICD discharges since 1/20. Suspect that CHF was trigger of VT. Has been quiescent in hospital.  - Dr Graciela Husbands decreased amiodarone to 200 mg BID due to a tremor. Continue at that dose for now. (amio resumed this admission) - Continue ranolazine.  - Beta blockers on hold with low output.  4. H/o CVA: On Plavix.  5. H/o SLE: On hydroxychloroquine. 6. UTI: Completed cipro course.  7. Gout: Suspect flare in shoulders.  - Continue colchicine - Finished 3 days prednisone and this resolved.  8. Disposition: PT recommended SNF but difficulty arranging this due to Medicaid (takes away monthly disability check). CIR consulted and would not affect her Medicaid payout per CIR PA. She was discharged to CIR. HF team will continue to follow.   Discharge Weight Range: 226 lbs Discharge Vitals: Blood pressure 93/69, pulse 84, temperature 98.3 F (36.8 C), temperature source Oral, resp. rate 19, height 6' (1.829 m), weight 101.1 kg, last menstrual period 01/30/2011, SpO2 99 %.  Labs: Lab Results  Component Value Date   WBC 7.9 04/05/2018  HGB 13.3 04/05/2018   HCT 40.3 04/05/2018   MCV 92.4 04/05/2018   PLT 483 (H) 04/05/2018    Recent Labs  Lab 04/05/18 0427  NA 136  K 4.1  CL 96*  CO2 28  BUN 22*  CREATININE 2.19*  CALCIUM 9.5  GLUCOSE 121*   No results found for: CHOL, HDL, LDLCALC, TRIG BNP (last 3 results) Recent Labs    01/12/18 0410 03/19/18 0702 03/24/18 1752   BNP 2,922.0* >4,500.0* 893.2*    ProBNP (last 3 results) No results for input(s): PROBNP in the last 8760 hours.   Diagnostic Studies/Procedures   Renal US 03/25/18: 1. Normal appearing kidneys. 2. Cholelithiasis. 3. Small right pleural effusion.  Discharge Medications   Allergies as of 04/05/2018      Reactions   Aspirin Other (See Comments)   Penicillins Itching   Can take amoxicillin without a reaction   Shellfish Allergy Other (See Comments)   Causes to have grand mal seizures      Medication List    STOP taking these medications   diphenhydrAMINE 25 mg capsule Commonly known as:  BENADRYL   furosemide 80 MG tablet Commonly known as:  Lasix   hydrALAZINE 10 MG tablet Commonly known as:  APRESOLINE   isosorbide mononitrate 30 MG 24 hr tablet Commonly known as:  IMDUR   Magnesium 400 MG Tabs   metoprolol succinate 50 MG 24 hr tablet Commonly known as:  TOPROL-XL   ondansetron 4 MG tablet Commonly known as:  ZOFRAN   ondansetron 4 MG/2ML Soln injection Commonly known as:  ZOFRAN   potassium chloride SA 20 MEQ tablet Commonly known as:  K-DUR,KLOR-CON   promethazine 25 MG/ML injection Commonly known as:  PHENERGAN     TAKE these medications   albuterol 108 (90 Base) MCG/ACT inhaler Commonly known as:  PROVENTIL HFA;VENTOLIN HFA Inhale 2 puffs into the lungs 4 (four) times daily as needed. For wheezing and shortness of breath   alum & mag hydroxide-simeth 200-200-20 MG/5ML suspension Commonly known as:  MAALOX/MYLANTA Take 30 mLs by mouth every 6 (six) hours as needed for indigestion or heartburn.   amiodarone 200 MG tablet Commonly known as:  PACERONE Take 1 tablet (200 mg total) by mouth 2 (two) times daily. What changed:  when to take this   cetirizine 10 MG tablet Commonly known as:  ZYRTEC Take 10 mg by mouth daily.   chlorhexidine 0.12 % solution Commonly known as:  PERIDEX 15 mLs by Mouth Rinse route 2 (two) times daily.    clopidogrel 75 MG tablet Commonly known as:  PLAVIX Take 1 tablet (75 mg total) by mouth daily.   COD LIVER OIL PO Take 1 tablet by mouth daily.   colchicine 0.6 MG tablet Take 1 tablet (0.6 mg total) by mouth daily.   Digoxin 62.5 MCG Tabs Take 0.0625 mg by mouth daily.   dimenhyDRINATE 50 MG tablet Commonly known as:  DRAMAMINE Take 0.5 tablets (25 mg total) by mouth every 8 (eight) hours as needed for dizziness.   guaiFENesin 100 MG/5ML Soln Commonly known as:  ROBITUSSIN Take 5 mLs (100 mg total) by mouth every 4 (four) hours as needed for cough or to loosen phlegm.   HumaLOG Mix 50/50 KwikPen (50-50) 100 UNIT/ML Kwikpen Generic drug:  Insulin Lispro Prot & Lispro INJECT 30 UNITS INTO THE SKIN TWICE DAILY.   hydrocortisone cream 0.5 % Apply topically 3 (three) times daily as needed for itching.   hydroxychloroquine 200 MG tablet Commonly  known as:  PLAQUENIL Take 200 mg by mouth 2 (two) times daily.   insulin aspart 100 UNIT/ML injection Commonly known as:  novoLOG Inject 0-9 Units into the skin 3 (three) times daily with meals.   insulin aspart 100 UNIT/ML injection Commonly known as:  novoLOG Inject 0-5 Units into the skin at bedtime.   isosorbide-hydrALAZINE 20-37.5 MG tablet Commonly known as:  BIDIL Take 1 tablet by mouth 3 (three) times daily.   loratadine 10 MG tablet Commonly known as:  CLARITIN Take 1 tablet (10 mg total) by mouth daily as needed for allergies or rhinitis.   mometasone-formoterol 200-5 MCG/ACT Aero Commonly known as:  DULERA Inhale 2 puffs into the lungs 2 (two) times daily.   MULTIVITAMIN WOMEN 50+ PO Take 1 tablet by mouth daily.   polyethylene glycol packet Commonly known as:  MIRALAX / GLYCOLAX Take 17 g by mouth daily as needed for moderate constipation.   ranolazine 500 MG 12 hr tablet Commonly known as:  RANEXA Take 1 tablet (500 mg total) by mouth 2 (two) times daily.   simvastatin 20 MG tablet Commonly known as:   ZOCOR TAKE ONE TABLET BY MOUTH AT BEDTIME.   sodium chloride 0.65 % Soln nasal spray Commonly known as:  OCEAN Place 1 spray into both nostrils as needed for congestion.   spironolactone 25 MG tablet Commonly known as:  ALDACTONE Take 1 tablet (25 mg total) by mouth daily.   torsemide 20 MG tablet Commonly known as:  DEMADEX Take 4 tablets (80 mg total) by mouth daily. Start taking on:  April 06, 2018   venlafaxine XR 150 MG 24 hr capsule Commonly known as:  EFFEXOR-XR Take 150 mg by mouth daily.            Durable Medical Equipment  (From admission, onward)         Start     Ordered   04/01/18 1545  For home use only DME 3 n 1  Once     04/01/18 1545          Disposition   The patient will be discharged in stable condition to CIR. Discharge Instructions    (HEART FAILURE PATIENTS) Call MD:  Anytime you have any of the following symptoms: 1) 3 pound weight gain in 24 hours or 5 pounds in 1 week 2) shortness of breath, with or without a dry hacking cough 3) swelling in the hands, feet or stomach 4) if you have to sleep on extra pillows at night in order to breathe.   Complete by:  As directed    Diet - low sodium heart healthy   Complete by:  As directed    Heart Failure patients record your daily weight using the same scale at the same time of day   Complete by:  As directed    Increase activity slowly   Complete by:  As directed    STOP any activity that causes chest pain, shortness of breath, dizziness, sweating, or exessive weakness   Complete by:  As directed      Follow-up Information    [facility] Follow up on 04/14/2018.   Specialty:  Cardiology Why:  Heart Failure F/U 04/14/18 @ 12 -Parking in ER lot (enter under blue awning to left of ER), or underneath [facility] in the [place] on [address] (garage code:[id number] , elevator to 1st floor).  -Take all am meds and bring all med bottles Contact  information: [address] [id number] Wilhemina Bonito [place] [address] [phone]            Duration of Discharge Encounter: Greater than 35 minutes   Signed, Alford Highland, NP 04/05/2018, 1:46 PM

## 2018-04-04 NOTE — PMR Pre-admission (Signed)
PMR Admission Coordinator Pre-Admission Assessment  Patient: Katherine [name] is an 58 y.o., female MRN: [id number] DOB: [birth year] Height: 6' (182.9 cm) Weight: 101.1 kg             Insurance Information HMO: No    PPO:       PCP:       IPA:       80/20:       OTHER:   PRIMARY:  Medicaid [place] access      Policy#: [id number]      Subscriber: patient CM Name:        Phone#:       Fax#:   Pre-Cert#:        Employer: disabled Benefits:  Phone #: [phone]     Name: Automated Eff. Date: eligible 04/04/18 with coverage code [id number]     Deduct:        Out of Pocket Max:        Life Max:   CIR:        SNF:   Outpatient:       Co-Pay:   Home Health:        Co-Pay:   DME:       Co-Pay:   Providers:    Medicaid Application Date:       Case Manager:  Disability Application Date:       Case Worker:   Emergency Contact Information Contact Information    Name Relation Home Work Mobile   [place] Friend   [phone]     Current Medical History  Patient Admitting Diagnosis:  Debility  History of Present Illness: A 58 y.o. female with with history of chronic systolic HF secondary to an NICM s/p ICD, CVA with residual left-sided weakness, V- tach/V- Fib-recently started on amiodarone, T2DM, CKD who was admitted to [facility] 03/19/2018 with nausea vomiting, worsening of dyspnea as well as orthopnea.  History taken from chart review and patient.  GI work-up revealed mildly elevated LFTs to cholestasis but no evidence of diabetes obstruction.  GI symptoms felt to be due to amiodarone and dose was decreased.  Patient with markedly volume overload but unable to tolerate due to acute chronic renal failure with low blood pressures.  She was transferred to [facility] on 01/23/2018 regimen. She was started on dobutamine and heart failure team following for aggressive diuresis. Weights trending down and dobutamine discontinued yesterday with up titration of medications and SCr back to baseline?  She developed  shoulder pain and treated for gout flare. She had issues with hemorrhoidal flare due to constipation --bowel program adjusted and reports that nausea has resolved with disimpaction.  Amiodarone decreased to 200 mg bid with resolution of tremors.   She is noted to be deconditioned and is refusing SNF. MD recommending CIR due to functional decline. She reports that fluid overload and respiratory status have greatly improved and she is able to tolerate sitting up in chair 3-5 hours daily. Lives with boyfriend who manages home and has been providing assistance for the past 6+months due to cardiac issues. She reports boyfriend can provide assistance as needed past discharge and would prefer to go to outpatient PT in [place] post discharge. She is agreeable to inpatient rehab after discussion.   Past Medical History  Past Medical History:  Diagnosis Date  . Anxiety   . Arthritis   . Asthma   . Bronchitis   . CHF (congestive heart failure) (HCC)    a. EF 20-25% by cath in  11/2010 with cath showing normal cors  . CKD (chronic kidney disease) stage 3, GFR 30-59 ml/min (HCC)   . Fibromyalgia   . GERD (gastroesophageal reflux disease)   . Hallux valgus of left foot   . Hay fever   . Headache(784.0)   . History of pneumonia   . History of seizures   . Hyperlipidemia   . Hypertension   . Nonischemic cardiomyopathy (HCC)   . Obesity   . Stroke (HCC)   . Systemic lupus erythematosus (HCC)   . Type 2 diabetes mellitus (HCC)     Family History  family history includes Diabetes in her father; Heart failure in her mother; Hypertension in her brother, daughter, and sister.  Prior Rehab/Hospitalizations: Patient reports that she has had 6-7 hospital admissions in the past year.  Has the patient had major surgery during 100 days prior to admission? No  Current Medications   Current Facility-Administered Medications:  .  0.9 %  sodium chloride infusion, 250 mL, Intravenous, PRN, Irene Limbo,  Callie E, PA-C .  Place/Maintain arterial line, , , Until Discontinued **AND** 0.9 %  sodium chloride infusion, , Intra-arterial, PRN, Laurey Morale, MD .  acetaminophen (TYLENOL) tablet 650 mg, 650 mg, Oral, Q4H PRN, Corrin Parker, PA-C, 650 mg at 03/25/18 1709 .  amiodarone (PACERONE) tablet 200 mg, 200 mg, Oral, BID, Laurey Morale, MD, 200 mg at 04/05/18 0855 .  bisacodyl (DULCOLAX) EC tablet 5 mg, 5 mg, Oral, Daily PRN, Laurey Morale, MD .  clopidogrel (PLAVIX) tablet 75 mg, 75 mg, Oral, Daily, Marjie Skiff E, PA-C, 75 mg at 04/05/18 0855 .  colchicine tablet 0.6 mg, 0.6 mg, Oral, Daily, Graciella Freer, PA-C, 0.6 mg at 04/05/18 0854 .  digoxin (LANOXIN) tablet 0.0625 mg, 0.0625 mg, Oral, Daily, Laurey Morale, MD, 0.0625 mg at 04/05/18 0855 .  diphenhydrAMINE (BENADRYL) injection 12.5 mg, 12.5 mg, Intravenous, Q8H PRN, Laurey Morale, MD, 12.5 mg at 03/31/18 1939 .  docusate sodium (COLACE) capsule 100 mg, 100 mg, Oral, BID, Laurey Morale, MD, 100 mg at 04/03/18 1006 .  feeding supplement (GLUCERNA SHAKE) (GLUCERNA SHAKE) liquid 237 mL, 237 mL, Oral, TID BM, End, Christopher, MD, 237 mL at 04/05/18 0857 .  heparin injection 5,000 Units, 5,000 Units, Subcutaneous, Q8H, Corrin Parker, PA-C, 5,000 Units at 04/05/18 6431 .  hydroxychloroquine (PLAQUENIL) tablet 200 mg, 200 mg, Oral, BID, Marjie Skiff E, PA-C, 200 mg at 04/05/18 0854 .  insulin aspart (novoLOG) injection 0-5 Units, 0-5 Units, Subcutaneous, QHS, Corrin Parker, PA-C, 3 Units at 03/30/18 2247 .  insulin aspart (novoLOG) injection 0-9 Units, 0-9 Units, Subcutaneous, TID WC, Goodrich, Callie E, PA-C, 2 Units at 04/05/18 1145 .  isosorbide-hydrALAZINE (BIDIL) 20-37.5 MG per tablet 1 tablet, 1 tablet, Oral, TID, Laurey Morale, MD, 1 tablet at 04/05/18 [phone] .  loratadine (CLARITIN) tablet 10 mg, 10 mg, Oral, Daily, Marjie Skiff E, PA-C, 10 mg at 04/05/18 0854 .  mometasone-formoterol  (DULERA) 200-5 MCG/ACT inhaler 2 puff, 2 puff, Inhalation, BID, Corrin Parker, PA-C, 2 puff at 04/05/18 0818 .  multivitamin with minerals tablet 1 tablet, 1 tablet, Oral, Daily, End, Christopher, MD, 1 tablet at 04/05/18 0854 .  oxyCODONE-acetaminophen (PERCOCET/ROXICET) 5-325 MG per tablet 1 tablet, 1 tablet, Oral, Q6H PRN, Leone Brand, NP, 1 tablet at 04/01/18 2149 .  prochlorperazine (COMPAZINE) injection 5 mg, 5 mg, Intravenous, Q6H PRN, Marjie Skiff E, PA-C, 5 mg at 04/05/18 1151 .  ranolazine (  RANEXA) 12 hr tablet 500 mg, 500 mg, Oral, BID, Marjie Skiff E, PA-C, 500 mg at 04/05/18 0855 .  sodium chloride flush (NS) 0.9 % injection 10-40 mL, 10-40 mL, Intracatheter, Q12H, End, Christopher, MD, 10 mL at 04/05/18 0858 .  sodium chloride flush (NS) 0.9 % injection 10-40 mL, 10-40 mL, Intracatheter, PRN, End, Christopher, MD .  spironolactone (ALDACTONE) tablet 25 mg, 25 mg, Oral, Daily, Laurey Morale, MD, 25 mg at 04/05/18 0854 .  venlafaxine XR (EFFEXOR-XR) 24 hr capsule 150 mg, 150 mg, Oral, Q breakfast, Corrin Parker, PA-C, 150 mg at 04/05/18 0607  Patients Current Diet:  Diet Order            Diet heart healthy/carb modified Room service appropriate? Yes; Fluid consistency: Thin; Fluid restriction: 2000 mL Fluid  Diet effective now              Precautions / Restrictions Precautions Precautions: Fall Restrictions Weight Bearing Restrictions: No   Has the patient had 2 or more falls or a fall with injury in the past year?Yes.  Patient reports 2-3 falls in the past 6 months with minor injuries.  Prior Activity Level Community (5-7x/wk): goes out 4-5 X a week.  BF or a friend drives.  Not working.  Home Assistive Devices / Equipment Home Assistive Devices/Equipment: Wheelchair, Environmental consultant (specify type) Home Equipment: Toilet riser, Wheelchair - manual, Environmental consultant - 4 wheels, Walker - 2 wheels, Grab bars - tub/shower  Prior Device Use: Indicate devices/aids  used by the patient prior to current illness, exacerbation or injury? Walker  Prior Functional Level Prior Function Level of Independence: Needs assistance Gait / Transfers Assistance Needed: Pt reports ambulatory with RW at home; sometimes needs help from family to get out of bed and to/from wheelchair. Reports increased difficulty indep pushing manual w/c, wants an electric scooter ADL's / Homemaking Assistance Needed: PRN assist from family for ADLs; reports she had supervision for shower transfers from a friend Comments: Reports 2 falls in past 6 months  Self Care: Did the patient need help bathing, dressing, using the toilet or eating?  Needed some help  Indoor Mobility: Did the patient need assistance with walking from room to room (with or without device)? Independent  Stairs: Did the patient need assistance with internal or external stairs (with or without device)? Needed some help  Functional Cognition: Did the patient need help planning regular tasks such as shopping or remembering to take medications? Independent  Current Functional Level Cognition  Overall Cognitive Status: Within Functional Limits for tasks assessed Current Attention Level: Sustained Orientation Level: Oriented X4 Following Commands: Follows one step commands consistently Safety/Judgement: Decreased awareness of safety, Decreased awareness of deficits General Comments: Fluctuating attention during tasks, Pt pleasant when re-assured and encouraged.     Extremity Assessment (includes Sensation/Coordination)  Upper Extremity Assessment: RUE deficits/detail, LUE deficits/detail RUE Deficits / Details: increased pain with elbow ROM requiring increased effort to perform, generalized weakness  LUE Deficits / Details: increased pain with elbow ROM requiring increased effort to perform, generalized weakness   Lower Extremity Assessment: Defer to PT evaluation    ADLs  Overall ADL's : Needs  assistance/impaired Eating/Feeding: Set up, Sitting Grooming: Set up, Oral care, Sitting Upper Body Bathing: Supervision/ safety, Sitting Lower Body Bathing: Moderate assistance, +2 for physical assistance, +2 for safety/equipment, Sit to/from stand Lower Body Bathing Details (indicate cue type and reason): assist to cleanse buttocks in standing Upper Body Dressing : Minimal assistance, Sitting Lower Body Dressing: Set  up, Sitting/lateral leans, Cueing for compensatory techniques Lower Body Dressing Details (indicate cue type and reason): pt. able to cross each leg over knee to don sneakers. max cues for initiation and completion of rest breaks in between each shoe. HR fluctuating from 86-88 with activity.   Toilet Transfer: Min guard, RW, Ambulation, [facility] Toilet Transfer Details (indicate cue type and reason): simulated in room with transfer from bed to chair Toileting- Clothing Manipulation and Hygiene: Moderate assistance, Sit to/from stand Toileting - Clothing Manipulation Details (indicate cue type and reason): no demonstrated during session as pt. did not actually use the b.room, only transfer Functional mobility during ADLs: Minimal assistance, Rolling walker General ADL Comments: pt. able to describe how she was feeling when needing a rest break prior to admit: (dizzy, "feel it in my chest") but unable to initiate rest breaks without cues, and once initiated was unable to sustatin the full rest break without attempting to do other tasks.     Mobility  Overal bed mobility: Needs Assistance Bed Mobility: Supine to Sit Supine to sit: Supervision, HOB elevated Sit to supine: Supervision, HOB elevated General bed mobility comments: +rail, increased time and effort    Transfers  Overall transfer level: Needs assistance Equipment used: Rolling walker (2 wheeled) Transfers: Sit to/from Stand Sit to Stand: Min assist, From elevated surface Stand pivot transfers: Min guard General transfer  comment: cues for hand placement, multi attempts to stand using momentum    Ambulation / Gait / Stairs / Psychologist, prison and probation services  Ambulation/Gait Ambulation/Gait assistance: Min guard, Editor, commissioning (Feet): 50 Feet Assistive device: Rolling walker (2 wheeled) Gait Pattern/deviations: Step-through pattern, Decreased stride length, Wide base of support General Gait Details: unsteady, mild steppage gait which increases with fatigue. Min guard assist straight, level surfaces. LOB during turn requiring min assist to maintain balance. Gait velocity: decreased Gait velocity interpretation: <1.31 ft/sec, indicative of household ambulator    Posture / Balance Dynamic Sitting Balance Sitting balance - Comments: steady sitting reaching outside BOS Balance Overall balance assessment: Needs assistance Sitting-balance support: No upper extremity supported, Feet supported Sitting balance-Leahy Scale: Fair Sitting balance - Comments: steady sitting reaching outside BOS Standing balance support: Bilateral upper extremity supported, During functional activity Standing balance-Leahy Scale: Poor Standing balance comment: reliant on RW    Special needs/care consideration BiPAP/CPAP No CPM No Continuous Drip IV No Dialysis No       Life Vest No Oxygen No Special Bed No Trach Size No Wound Vac (area) No       Skin Dry, saggy skin per patient from weight/fluid loss                            Bowel mgmt: Last Bm 04/04/18 Bladder mgmt: Voiding up on bedside commode Diabetic mgmt Yes, on insulin at home    Previous Home Environment Living Arrangements: Alone Available Help at Discharge: Friend(s), Family, Available PRN/intermittently Type of Home: House Home Layout: One level Home Access: Stairs to enter Entrance Stairs-Rails: Right Entrance Stairs-Number of Steps: 4(pt thinks someone is building a ramp) Bathroom Shower/Tub: Health visitor: Standard Home Care Services:  No  Discharge Living Setting Plans for Discharge Living Setting: Lives with (comment), Other (Comment)(Lives with BF in a modular home.) Type of Home at Discharge: Other (Comment)(Modular home) Discharge Home Layout: One level Discharge Home Access: Stairs to enter Entrance Stairs-Rails: Right Entrance Stairs-Number of Steps: 3 Discharge Bathroom Shower/Tub: Walk-in shower, Door Discharge  Bathroom Toilet: Handicapped height Discharge Bathroom Accessibility: Yes How Accessible: Accessible via walker Does the patient have any problems obtaining your medications?: No  Social/Family/Support Systems Patient Roles: Other (Comment)(Has BF, 2 daughters.) Contact Information: Gabrielle Dare - Boyfriend Anticipated Caregiver: BF Anticipated Caregiver's Contact Information: Bebe Shaggy - [phone] Ability/Limitations of Caregiver: Renae Fickle works as a Scientist, water quality.  Daughters work.  Has a son in law and a best friend who can assist. Caregiver Availability: Other (Comment)(Patient aware of potential need for 24/7 help at discharge) Discharge Plan Discussed with Primary Caregiver: Yes Is Caregiver In Agreement with Plan?: Yes Does Caregiver/Family have Issues with Lodging/Transportation while Pt is in Rehab?: No  Goals/Additional Needs Patient/Family Goal for Rehab: PT/OT supervision goals Expected length of stay: 10-14 days Cultural Considerations: Apostolic faith Dietary Needs: Heart healthy, carb mod, thin liquids Equipment Needs: TBD Pt/Family Agrees to Admission and willing to participate: Yes Program Orientation Provided & Reviewed with Pt/Caregiver Including Roles  & Responsibilities: Yes  Decrease burden of Care through IP rehab admission: N/A  Possible need for SNF placement upon discharge: Not planned  Patient Condition: This patient's condition remains as documented in the consult dated 04/04/18, in which the Rehabilitation Physician determined and documented that the patient's condition is  appropriate for intensive rehabilitative care in an inpatient rehabilitation facility. Note that patient is now agreeable to inpatient rehab admission.  Will admit to inpatient rehab today.  Preadmission Screen Completed By:  Trish Mage, 04/05/2018 11:58 AM ______________________________________________________________________   Discussed status with Dr. Riley Kill on 04/05/18 at 1158 and received telephone approval for admission today.  Admission Coordinator:  Trish Mage, time 1159/Date 04/05/18

## 2018-04-04 NOTE — Consult Note (Signed)
Physical Medicine and Rehabilitation Consult   Reason for Consult: Debility Referring Physician: Dr. Aundra Dubin   HPI: Katherine [name] is a 58 y.o. female with with history of chronic systolic HF secondary to an NICM s/p ICD, CVA with residual left-sided weakness, V- tach/V- Fib-recently started on amiodarone, T2DM, CKD who was admitted to [facility] 03/19/2018 with nausea vomiting, worsening of dyspnea as well as orthopnea.  History taken from chart review and patient.  GI work-up revealed mildly elevated LFTs to cholestasis but no evidence of diabetes obstruction.  GI symptoms felt to be due to amiodarone and dose was decreased.  Patient with markedly volume overload but unable to tolerate due to acute chronic renal failure with low blood pressures.  She was transferred to [facility] on 01/23/2018 regimen. She was started on dobutamine and heart failure team following for aggressive diuresis. Weights trending down and dobutamine discontinued yesterday with up titration of medications and SCr back to baseline?  She developed shoulder pain and treated for gout flare. She had issues with hemorrhoidal flare due to constipation --bowel program adjusted and reports that nausea has resolved with disimpaction.  Amiodarone decreased to 200 mg bid with resolution of tremors.   She is noted to be deconditioned and is refusing SNF. MD recommending CIR due to functional decline. She reports that fluid overload and respiratory status have greatly improved and she is able to tolerate sitting up in chair 3-5 hours daily. Lives with boyfriend who manages home and has been providing assistance for the past 6+months due to cardiac issues. She reports boyfriend can provide assistance as needed past discharge and would prefer to go to outpatient PT in [place] post discharge.     Review of Systems  Constitutional: Negative for chills and fever.  HENT: Negative for hearing loss and tinnitus.   Eyes: Negative for blurred  vision and double vision.  Respiratory: Negative for cough and hemoptysis.   Cardiovascular: Negative for chest pain and palpitations.  Gastrointestinal: Positive for nausea. Negative for abdominal pain and constipation.  Musculoskeletal: Positive for falls (due to syncope/VT?) and myalgias (with weather changes).  Skin: Positive for rash. Negative for itching.  Neurological: Positive for focal weakness (chronic left sided weakness due to multiple strokes) and weakness. Negative for dizziness.  Psychiatric/Behavioral: The patient is not nervous/anxious and does not have insomnia.   All other systems reviewed and are negative.    Past Medical History:  Diagnosis Date  . Anxiety   . Arthritis   . Asthma   . Bronchitis   . CHF (congestive heart failure) ([place])    a. EF 20-25% by cath in 11/2010 with cath showing normal cors  . CKD (chronic kidney disease) stage 3, GFR 30-59 ml/min (HCC)   . Fibromyalgia   . GERD (gastroesophageal reflux disease)   . Hallux valgus of left foot   . Hay fever   . Headache(784.0)   . History of pneumonia   . History of seizures   . Hyperlipidemia   . Hypertension   . Nonischemic cardiomyopathy ([place])   . Obesity   . Stroke ([place])   . Systemic lupus erythematosus ([place])   . Type 2 diabetes mellitus ([place])     Past Surgical History:  Procedure Laterality Date  . ICD  02/13/2011   Medtronic Protecta DR XT  . ICD GENERATOR CHANGEOUT N/A 01/07/2017   Procedure: [place];  Surgeon: Evans Lance, MD;  Location: [facility];  Service:  Cardiovascular;  Laterality: N/A;  . IMPLANTABLE CARDIOVERTER DEFIBRILLATOR IMPLANT N/A 02/13/2011   Procedure: IMPLANTABLE CARDIOVERTER DEFIBRILLATOR IMPLANT;  Surgeon: Sanda Klein, MD;  Location: [facility] CATH LAB;  Service: Cardiovascular;  Laterality: N/A;  . LEFT HEART CATHETERIZATION WITH CORONARY ANGIOGRAM N/A 12/05/2010   Procedure: LEFT HEART CATHETERIZATION WITH CORONARY ANGIOGRAM;  Surgeon:  Pixie Casino, MD;  Location: [facility] CATH LAB;  Service: Cardiovascular;  Laterality: N/A;  . RIGHT ANKLE    . TUBAL LIGATION    . US ECHOCARDIOGRAPHY  11/09/2010   mild LV enlargement w/conc. LVH & severe global hypokinesis EF 30-35%,mod. diastolic dysfunction, mod. TR,mild AI,mild to mod MR,mild PI    Family History  Problem Relation Age of Onset  . Heart failure Mother   . Diabetes Father   . Hypertension Sister   . Hypertension Brother   . Hypertension Daughter     Social History: Lives with boyfriend (who does not work). Disabled due to multiple medical issues. She was independent but has been limited by DOE since August 2019.  She  reports that she quit smoking about 12 years ago. Her smoking use included cigarettes. She started smoking about 38 years ago. She has a 26.00 pack-year smoking history. She has never used smokeless tobacco. She reports previous alcohol use. She reports that she does not use drugs.   Allergies  Allergen Reactions  . Aspirin Other (See Comments)  . Penicillins Itching    Can take amoxicillin without a reaction  . Shellfish Allergy Other (See Comments)    Causes to have grand mal seizures    Medications Prior to Admission  Medication Sig Dispense Refill  . albuterol (PROVENTIL HFA;VENTOLIN HFA) 108 (90 BASE) MCG/ACT inhaler Inhale 2 puffs into the lungs 4 (four) times daily as needed. For wheezing and shortness of breath    . alum & mag hydroxide-simeth (MAALOX/MYLANTA) 200-200-20 MG/5ML suspension Take 30 mLs by mouth every 6 (six) hours as needed for indigestion or heartburn. 355 mL 0  . amiodarone (PACERONE) 200 MG tablet Take 1 tablet (200 mg total) by mouth every 6 (six) hours.    . cetirizine (ZYRTEC) 10 MG tablet Take 10 mg by mouth daily.     . chlorhexidine (PERIDEX) 0.12 % solution 15 mLs by Mouth Rinse route 2 (two) times daily. 120 mL 0  . clopidogrel (PLAVIX) 75 MG tablet Take 1 tablet (75 mg total) by mouth daily. 90 tablet 3  . COD  LIVER OIL PO Take 1 tablet by mouth daily.     Marland Kitchen dimenhyDRINATE (DRAMAMINE) 50 MG tablet Take 0.5 tablets (25 mg total) by mouth every 8 (eight) hours as needed for dizziness. 30 tablet 0  . diphenhydrAMINE (BENADRYL) 25 mg capsule Take 1 capsule (25 mg total) by mouth every 6 (six) hours as needed for itching. 30 capsule 0  . furosemide (LASIX) 80 MG tablet Take 1 tablet (80 mg total) by mouth daily. 30 tablet 11  . guaiFENesin (ROBITUSSIN) 100 MG/5ML SOLN Take 5 mLs (100 mg total) by mouth every 4 (four) hours as needed for cough or to loosen phlegm. 236 mL 0  . HUMALOG MIX 50/50 KWIKPEN (50-50) 100 UNIT/ML Kwikpen INJECT 30 UNITS INTO THE SKIN TWICE DAILY. 15 mL 2  . hydrALAZINE (APRESOLINE) 10 MG tablet Take 2 tablets (20 mg total) by mouth every 8 (eight) hours. 180 tablet 3  . hydrocortisone cream 0.5 % Apply topically 3 (three) times daily as needed for itching. 30 g 0  . hydroxychloroquine (PLAQUENIL)  200 MG tablet Take 200 mg by mouth 2 (two) times daily.    . insulin aspart (NOVOLOG) 100 UNIT/ML injection Inject 0-9 Units into the skin 3 (three) times daily with meals. 10 mL 11  . insulin aspart (NOVOLOG) 100 UNIT/ML injection Inject 0-5 Units into the skin at bedtime. 10 mL 11  . isosorbide mononitrate (IMDUR) 30 MG 24 hr tablet Take 1 tablet (30 mg total) by mouth daily. 30 tablet 3  . loratadine (CLARITIN) 10 MG tablet Take 1 tablet (10 mg total) by mouth daily as needed for allergies or rhinitis.    . Magnesium 400 MG TABS Take 400 mg by mouth daily for 30 days. 30 tablet 0  . metoprolol succinate (TOPROL-XL) 50 MG 24 hr tablet Take 1 tablet (50 mg total) by mouth daily. Take with or immediately following a meal. 30 tablet 3  . mometasone-formoterol (DULERA) 200-5 MCG/ACT AERO Inhale 2 puffs into the lungs 2 (two) times daily. 8.8 g 3  . Multiple Vitamins-Minerals (MULTIVITAMIN WOMEN 50+ PO) Take 1 tablet by mouth daily.    . ondansetron (ZOFRAN) 4 MG tablet Take 1 tablet (4 mg total)  by mouth every 8 (eight) hours as needed for nausea or vomiting. 20 tablet 0  . ondansetron (ZOFRAN) 4 MG/2ML SOLN injection Inject 2 mLs (4 mg total) into the vein every 6 (six) hours as needed for nausea. 2 mL 0  . polyethylene glycol (MIRALAX / GLYCOLAX) packet Take 17 g by mouth daily as needed for moderate constipation. 14 each 0  . potassium chloride SA (K-DUR,KLOR-CON) 20 MEQ tablet Take 2 tablets (40 mEq total) by mouth daily. 60 tablet 3  . promethazine (PHENERGAN) 25 MG/ML injection Inject 1 mL (25 mg total) into the vein every 6 (six) hours as needed for nausea or vomiting. 1 mL 0  . ranolazine (RANEXA) 500 MG 12 hr tablet Take 1 tablet (500 mg total) by mouth 2 (two) times daily.    . simvastatin (ZOCOR) 20 MG tablet TAKE ONE TABLET BY MOUTH AT BEDTIME. 30 tablet 0  . sodium chloride (OCEAN) 0.65 % SOLN nasal spray Place 1 spray into both nostrils as needed for congestion.  0  . venlafaxine XR (EFFEXOR-XR) 150 MG 24 hr capsule Take 150 mg by mouth daily.       Home: Home Living Family/patient expects to be discharged to:: Private residence Living Arrangements: Alone Available Help at Discharge: Friend(s), Family, Available PRN/intermittently Type of Home: House Home Access: Stairs to enter [employer] of Steps: 4(pt thinks someone is building a ramp) Entrance Stairs-Rails: Right Home Layout: One level Bathroom Shower/Tub: Multimedia programmer: Standard Home Equipment: Geneticist, molecular, Wheelchair - manual, Environmental consultant - 4 wheels, Environmental consultant - 2 wheels, Grab bars - tub/shower  Functional History: Prior Function Level of Independence: Needs assistance Gait / Transfers Assistance Needed: Pt reports ambulatory with RW at home; sometimes needs help from family to get out of bed and to/from wheelchair. Reports increased difficulty indep pushing manual w/c, wants an electric scooter ADL's / Homemaking Assistance Needed: PRN assist from family for ADLs; reports she had  supervision for shower transfers from a friend Comments: Reports 2 falls in past 6 months Functional Status:  Mobility: Bed Mobility Overal bed mobility: Needs Assistance Bed Mobility: Supine to Sit Supine to sit: Supervision Sit to supine: Supervision, HOB elevated General bed mobility comments: seated in recliner at beginning and end of session Transfers Overall transfer level: Needs assistance Equipment used: Rolling walker (2 wheeled)  Transfers: Sit to/from Stand, Risk manager Sit to Stand: Min assist Stand pivot transfers: Min guard General transfer comment: Hands on assist for safety, cues for hand placement and sequencing  Ambulation/Gait Ambulation/Gait assistance: Min guard Gait Distance (Feet): 20 Feet Assistive device: Rolling walker (2 wheeled) Gait Pattern/deviations: Decreased stride length, Wide base of support, Trunk flexed General Gait Details: Pt with unsteady gait requiring assist for safety. Gait velocity: decreased Gait velocity interpretation: <1.31 ft/sec, indicative of household ambulator    ADL: ADL Overall ADL's : Needs assistance/impaired Eating/Feeding: Set up, Sitting Grooming: Set up, Oral care, Sitting Upper Body Bathing: Supervision/ safety, Sitting Lower Body Bathing: Moderate assistance, +2 for physical assistance, +2 for safety/equipment, Sit to/from stand Lower Body Bathing Details (indicate cue type and reason): assist to cleanse buttocks in standing Upper Body Dressing : Minimal assistance, Sitting Lower Body Dressing: Set up, Sitting/lateral leans, Cueing for compensatory techniques Lower Body Dressing Details (indicate cue type and reason): pt. able to cross each leg over knee to don sneakers. max cues for initiation and completion of rest breaks in between each shoe. HR fluctuating from 86-88 with activity.   Toilet Transfer: Min guard, RW, Ambulation, [facility] Toilet Transfer Details (indicate cue type and reason): simulated in room  with transfer from bed to chair Toileting- Clothing Manipulation and Hygiene: Moderate assistance, Sit to/from stand Toileting - Clothing Manipulation Details (indicate cue type and reason): no demonstrated during session as pt. did not actually use the b.room, only transfer Functional mobility during ADLs: Minimal assistance, Rolling walker General ADL Comments: pt. able to describe how she was feeling when needing a rest break prior to admit: (dizzy, "feel it in my chest") but unable to initiate rest breaks without cues, and once initiated was unable to sustatin the full rest break without attempting to do other tasks.   Cognition: Cognition Overall Cognitive Status: No family/caregiver present to determine baseline cognitive functioning Orientation Level: Oriented X4 Cognition Arousal/Alertness: Awake/alert Behavior During Therapy: WFL for tasks assessed/performed Overall Cognitive Status: No family/caregiver present to determine baseline cognitive functioning Area of Impairment: Attention, Memory, Following commands, Safety/judgement, Problem solving Current Attention Level: Sustained Memory: Decreased short-term memory Following Commands: Follows one step commands consistently Safety/Judgement: Decreased awareness of safety, Decreased awareness of deficits Problem Solving: Slow processing, Requires verbal cues General Comments: Fluctuating attention during tasks, Pt pleasant when re-assured and encouraged.    Blood pressure 122/61, pulse 90, temperature 98.1 F (36.7 C), temperature source Oral, resp. rate (!) 21, height 6' (1.829 m), weight 102.6 kg, last menstrual period 01/30/2011, SpO2 98 %. Physical Exam  Nursing note and vitals reviewed. Constitutional: She is oriented to person, place, and time. She appears well-developed.  Obese  HENT:  Right Ear: External ear normal.  Left Ear: External ear normal.  Right facial edema (due to trauma post fall?)  Eyes: EOM are normal.  Right eye exhibits no discharge. Left eye exhibits no discharge.  Neck: Normal range of motion. Neck supple.  Cardiovascular: Normal rate and regular rhythm.  Respiratory: Effort normal and breath sounds normal.  GI: Soft. Bowel sounds are normal.  Musculoskeletal:     Comments: No edema or tenderness in extremities  Neurological: She is alert and oriented to person, place, and time.  Motor: Bilateral upper extremities: 4-/5 proximal distal Bilateral lower extremities: Hip flexion 3+/5, knee extension 4-/5, ankle dorsiflexion 4-/5 (left stronger than right)  Skin:  Bilateral knees with mild erythema and healing lesions. BLE with loose unna boots.   Psychiatric:  She has a normal mood and affect. Her behavior is normal.    Results for orders placed or performed during the hospital encounter of 03/24/18 (from the past 24 hour(s))  Glucose, capillary     Status: Abnormal   Collection Time: 04/03/18 11:17 AM  Result Value Ref Range   Glucose-Capillary 138 (H) 70 - 99 mg/dL   Comment 1 Notify RN    Comment 2 Document in Chart   Glucose, capillary     Status: Abnormal   Collection Time: 04/03/18  4:35 PM  Result Value Ref Range   Glucose-Capillary 109 (H) 70 - 99 mg/dL   Comment 1 Notify RN    Comment 2 Document in Chart   Glucose, capillary     Status: None   Collection Time: 04/03/18  9:37 PM  Result Value Ref Range   Glucose-Capillary 97 70 - 99 mg/dL  .Cooxemetry Panel (carboxy, met, total hgb, O2 sat)     Status: Abnormal   Collection Time: 04/04/18  5:30 AM  Result Value Ref Range   Total hemoglobin 11.2 (L) 12.0 - 16.0 g/dL   O2 Saturation 71.2 %   Carboxyhemoglobin 1.9 (H) 0.5 - 1.5 %   Methemoglobin 1.0 0.0 - 1.5 %  Basic metabolic panel     Status: Abnormal   Collection Time: 04/04/18  5:56 AM  Result Value Ref Range   Sodium 137 135 - 145 mmol/L   Potassium 3.2 (L) 3.5 - 5.1 mmol/L   Chloride 99 98 - 111 mmol/L   CO2 26 22 - 32 mmol/L   Glucose, Bld 98 70 - 99 mg/dL    BUN 22 (H) 6 - 20 mg/dL   Creatinine, Ser 1.98 (H) 0.44 - 1.00 mg/dL   Calcium 8.4 (L) 8.9 - 10.3 mg/dL   GFR calc non Af Amer 27 (L) >60 mL/min   GFR calc Af Amer 32 (L) >60 mL/min   Anion gap 12 5 - 15  CBC with Differential/Platelet     Status: Abnormal   Collection Time: 04/04/18  5:56 AM  Result Value Ref Range   WBC 9.7 4.0 - 10.5 K/uL   RBC 4.02 3.87 - 5.11 MIL/uL   Hemoglobin 12.4 12.0 - 15.0 g/dL   HCT 36.8 36.0 - 46.0 %   MCV 91.5 80.0 - 100.0 fL   MCH 30.8 26.0 - 34.0 pg   MCHC 33.7 30.0 - 36.0 g/dL   RDW 18.6 (H) 11.5 - 15.5 %   Platelets 430 (H) 150 - 400 K/uL   nRBC 0.0 0.0 - 0.2 %   Neutrophils Relative % 55 %   Neutro Abs 5.4 1.7 - 7.7 K/uL   Lymphocytes Relative 29 %   Lymphs Abs 2.8 0.7 - 4.0 K/uL   Monocytes Relative 10 %   Monocytes Absolute 1.0 0.1 - 1.0 K/uL   Eosinophils Relative 3 %   Eosinophils Absolute 0.3 0.0 - 0.5 K/uL   Basophils Relative 1 %   Basophils Absolute 0.1 0.0 - 0.1 K/uL   Immature Granulocytes 2 %   Abs Immature Granulocytes 0.15 (H) 0.00 - 0.07 K/uL  Glucose, capillary     Status: None   Collection Time: 04/04/18  7:29 AM  Result Value Ref Range   Glucose-Capillary 99 70 - 99 mg/dL   No results found.  Assessment/Plan: Diagnosis: Debility Labs independently reviewed.  Records reviewed and summated above.  1. Does the need for close, 24 hr/day medical supervision in concert with the patient's rehab needs  make it unreasonable for this patient to be served in a less intensive setting? No  2. Co-Morbidities requiring supervision/potential complications: chronic systolic CHF (Monitor in accordance with increased physical activity and avoid UE resistance excercises), history of CVA with residual left-sided weakness, V- tach/V- Fib-recently started on amiodarone, T2 DM (Monitor in accordance with exercise and adjust meds as necessary), CKD (avoid nephrotoxic meds), gout (continue meds), hemorrhoids (continue meds), hypokalemia (continue  to monitor and replete as necessary) 3. Due to bladder management, safety, skin/wound care, disease management and patient education, does the patient require 24 hr/day rehab nursing? Potentially 4. Does the patient require coordinated care of a physician, rehab nurse, PT (1-2 hrs/day, 5 days/week) and OT (1-2 hrs/day, 5 days/week) to address physical and functional deficits in the context of the above medical diagnosis(es)? Potentially Addressing deficits in the following areas: balance, endurance, locomotion, strength, transferring, bathing, dressing, toileting and psychosocial support 5. Can the patient actively participate in an intensive therapy program of at least 3 hrs of therapy per day at least 5 days per week? Potentially 6. The potential for patient to make measurable gains while on inpatient rehab is good 7. Anticipated functional outcomes upon discharge from inpatient rehab are supervision  with PT, supervision with OT, n/a with SLP. 8. Estimated rehab length of stay to reach the above functional goals is: 10-14 days. 9. Anticipated D/C setting: Home 10. Anticipated post D/C treatments: HH therapy and Home excercise program 11. Overall Rehab/Functional Prognosis: good  RECOMMENDATIONS: This patient's condition is appropriate for continued rehabilitative care in the following setting: Patient predominantly limited by endurance.  She is refusing CIR and would like to go home with [facility] and support of her husband. Given progress, believe this is acceptable. Recommend home with [facility] if patient continues to refuse SNF. Patient has agreed to participate in recommended program. Potentially Note that insurance prior authorization may be required for reimbursement for recommended care.  Comment: Rehab Admissions Coordinator to follow up.   I have personally performed a face to face diagnostic evaluation, including, but not limited to relevant history and physical exam findings, of this patient and  developed relevant assessment and plan.  Additionally, I have reviewed and concur with the physician assistant's documentation above.   Delice Lesch, MD, ABPMR Bary Leriche, PA-C 04/04/2018

## 2018-04-05 ENCOUNTER — Encounter (HOSPITAL_COMMUNITY): Payer: Self-pay | Admitting: *Deleted

## 2018-04-05 ENCOUNTER — Other Ambulatory Visit: Payer: Self-pay

## 2018-04-05 ENCOUNTER — Inpatient Hospital Stay (HOSPITAL_COMMUNITY)
Admission: RE | Admit: 2018-04-05 | Discharge: 2018-04-12 | DRG: 945 | Disposition: A | Discharge status: Home Health | Payer: Medicaid Other | Source: Intra-hospital | Attending: Physical Medicine & Rehabilitation | Admitting: Physical Medicine & Rehabilitation

## 2018-04-05 DIAGNOSIS — S80212A Abrasion, left knee, initial encounter: Secondary | ICD-10-CM | POA: Diagnosis present

## 2018-04-05 DIAGNOSIS — E785 Hyperlipidemia, unspecified: Secondary | ICD-10-CM | POA: Diagnosis present

## 2018-04-05 DIAGNOSIS — S80211A Abrasion, right knee, initial encounter: Secondary | ICD-10-CM | POA: Diagnosis present

## 2018-04-05 DIAGNOSIS — I5022 Chronic systolic (congestive) heart failure: Secondary | ICD-10-CM

## 2018-04-05 DIAGNOSIS — E1169 Type 2 diabetes mellitus with other specified complication: Secondary | ICD-10-CM

## 2018-04-05 DIAGNOSIS — I428 Other cardiomyopathies: Secondary | ICD-10-CM | POA: Diagnosis present

## 2018-04-05 DIAGNOSIS — M109 Gout, unspecified: Secondary | ICD-10-CM | POA: Diagnosis present

## 2018-04-05 DIAGNOSIS — X58XXXA Exposure to other specified factors, initial encounter: Secondary | ICD-10-CM | POA: Diagnosis present

## 2018-04-05 DIAGNOSIS — E669 Obesity, unspecified: Secondary | ICD-10-CM

## 2018-04-05 DIAGNOSIS — Z95810 Presence of automatic (implantable) cardiac defibrillator: Secondary | ICD-10-CM | POA: Diagnosis not present

## 2018-04-05 DIAGNOSIS — E1122 Type 2 diabetes mellitus with diabetic chronic kidney disease: Secondary | ICD-10-CM | POA: Diagnosis present

## 2018-04-05 DIAGNOSIS — M1990 Unspecified osteoarthritis, unspecified site: Secondary | ICD-10-CM | POA: Diagnosis present

## 2018-04-05 DIAGNOSIS — I5023 Acute on chronic systolic (congestive) heart failure: Secondary | ICD-10-CM | POA: Diagnosis present

## 2018-04-05 DIAGNOSIS — Z794 Long term (current) use of insulin: Secondary | ICD-10-CM

## 2018-04-05 DIAGNOSIS — E876 Hypokalemia: Secondary | ICD-10-CM

## 2018-04-05 DIAGNOSIS — R5381 Other malaise: Principal | ICD-10-CM

## 2018-04-05 DIAGNOSIS — Z9181 History of falling: Secondary | ICD-10-CM

## 2018-04-05 DIAGNOSIS — I130 Hypertensive heart and chronic kidney disease with heart failure and stage 1 through stage 4 chronic kidney disease, or unspecified chronic kidney disease: Secondary | ICD-10-CM | POA: Diagnosis present

## 2018-04-05 DIAGNOSIS — Z7902 Long term (current) use of antithrombotics/antiplatelets: Secondary | ICD-10-CM | POA: Diagnosis not present

## 2018-04-05 DIAGNOSIS — Z886 Allergy status to analgesic agent status: Secondary | ICD-10-CM | POA: Diagnosis not present

## 2018-04-05 DIAGNOSIS — N390 Urinary tract infection, site not specified: Secondary | ICD-10-CM | POA: Diagnosis present

## 2018-04-05 DIAGNOSIS — Y939 Activity, unspecified: Secondary | ICD-10-CM

## 2018-04-05 DIAGNOSIS — Z8249 Family history of ischemic heart disease and other diseases of the circulatory system: Secondary | ICD-10-CM

## 2018-04-05 DIAGNOSIS — N183 Chronic kidney disease, stage 3 unspecified: Secondary | ICD-10-CM | POA: Diagnosis present

## 2018-04-05 DIAGNOSIS — I472 Ventricular tachycardia: Secondary | ICD-10-CM | POA: Diagnosis present

## 2018-04-05 DIAGNOSIS — J45909 Unspecified asthma, uncomplicated: Secondary | ICD-10-CM | POA: Diagnosis present

## 2018-04-05 DIAGNOSIS — R251 Tremor, unspecified: Secondary | ICD-10-CM | POA: Diagnosis present

## 2018-04-05 DIAGNOSIS — Z8673 Personal history of transient ischemic attack (TIA), and cerebral infarction without residual deficits: Secondary | ICD-10-CM

## 2018-04-05 DIAGNOSIS — Z87891 Personal history of nicotine dependence: Secondary | ICD-10-CM

## 2018-04-05 DIAGNOSIS — M797 Fibromyalgia: Secondary | ICD-10-CM | POA: Diagnosis present

## 2018-04-05 DIAGNOSIS — E441 Mild protein-calorie malnutrition: Secondary | ICD-10-CM | POA: Diagnosis present

## 2018-04-05 DIAGNOSIS — N179 Acute kidney failure, unspecified: Secondary | ICD-10-CM | POA: Diagnosis present

## 2018-04-05 DIAGNOSIS — Z880 Allergy status to penicillin: Secondary | ICD-10-CM | POA: Diagnosis not present

## 2018-04-05 DIAGNOSIS — Z91013 Allergy to seafood: Secondary | ICD-10-CM

## 2018-04-05 DIAGNOSIS — Z79899 Other long term (current) drug therapy: Secondary | ICD-10-CM | POA: Diagnosis not present

## 2018-04-05 DIAGNOSIS — M329 Systemic lupus erythematosus, unspecified: Secondary | ICD-10-CM | POA: Diagnosis present

## 2018-04-05 DIAGNOSIS — Z6829 Body mass index (BMI) 29.0-29.9, adult: Secondary | ICD-10-CM

## 2018-04-05 DIAGNOSIS — K219 Gastro-esophageal reflux disease without esophagitis: Secondary | ICD-10-CM | POA: Diagnosis present

## 2018-04-05 DIAGNOSIS — Z833 Family history of diabetes mellitus: Secondary | ICD-10-CM

## 2018-04-05 DIAGNOSIS — R040 Epistaxis: Secondary | ICD-10-CM | POA: Diagnosis not present

## 2018-04-05 DIAGNOSIS — F419 Anxiety disorder, unspecified: Secondary | ICD-10-CM | POA: Diagnosis present

## 2018-04-05 LAB — GLUCOSE, CAPILLARY
GLUCOSE-CAPILLARY: 99 mg/dL (ref 70–99)
Glucose-Capillary: 156 mg/dL — ABNORMAL HIGH (ref 70–99)
Glucose-Capillary: 109 mg/dL — ABNORMAL HIGH (ref 70–99)
Glucose-Capillary: 140 mg/dL — ABNORMAL HIGH (ref 70–99)

## 2018-04-05 LAB — BASIC METABOLIC PANEL
ANION GAP: 12 (ref 5–15)
BUN: 22 mg/dL — ABNORMAL HIGH (ref 6–20)
CHLORIDE: 96 mmol/L — AB (ref 98–111)
CO2: 28 mmol/L (ref 22–32)
Calcium: 9.5 mg/dL (ref 8.9–10.3)
Creatinine, Ser: 2.19 mg/dL — ABNORMAL HIGH (ref 0.44–1.00)
GFR calc Af Amer: 28 mL/min — ABNORMAL LOW (ref >60)
GFR calc non Af Amer: 24 mL/min — ABNORMAL LOW (ref >60)
Glucose, Bld: 121 mg/dL — ABNORMAL HIGH (ref 70–99)
Potassium: 4.1 mmol/L (ref 3.5–5.1)
Sodium: 136 mmol/L (ref 135–145)

## 2018-04-05 LAB — COOXEMETRY PANEL
Carboxyhemoglobin: 1.3 % (ref 0.5–1.5)
Carboxyhemoglobin: 1.5 % (ref 0.5–1.5)
Methemoglobin: 0.8 % (ref 0.0–1.5)
Methemoglobin: 0.9 % (ref 0.0–1.5)
O2 Saturation: 47.8 %
O2 Saturation: 56 %
Total hemoglobin: 13.8 g/dL (ref 12.0–16.0)
Total hemoglobin: 13.1 g/dL (ref 12.0–16.0)

## 2018-04-05 LAB — CBC WITH DIFFERENTIAL/PLATELET
Abs Immature Granulocytes: 0.07 10*3/uL (ref 0.00–0.07)
Basophils Absolute: 0.1 10*3/uL (ref 0.0–0.1)
Basophils Relative: 1 %
Eosinophils Absolute: 0.3 10*3/uL (ref 0.0–0.5)
Eosinophils Relative: 4 %
HCT: 40.3 % (ref 36.0–46.0)
Hemoglobin: 13.3 g/dL (ref 12.0–15.0)
Immature Granulocytes: 1 %
LYMPHS PCT: 41 %
Lymphs Abs: 3.2 10*3/uL (ref 0.7–4.0)
MCH: 30.5 pg (ref 26.0–34.0)
MCHC: 33 g/dL (ref 30.0–36.0)
MCV: 92.4 fL (ref 80.0–100.0)
Monocytes Absolute: 0.6 10*3/uL (ref 0.1–1.0)
Monocytes Relative: 8 %
Neutro Abs: 3.6 10*3/uL (ref 1.7–7.7)
Neutrophils Relative %: 45 %
Platelets: 483 10*3/uL — ABNORMAL HIGH (ref 150–400)
RBC: 4.36 MIL/uL (ref 3.87–5.11)
RDW: 18.5 % — ABNORMAL HIGH (ref 11.5–15.5)
WBC: 7.9 10*3/uL (ref 4.0–10.5)
nRBC: 0 % (ref 0.0–0.2)

## 2018-04-05 LAB — DIGOXIN LEVEL: Digoxin Level: <0.2 ng/mL — ABNORMAL LOW (ref 0.8–2.0)

## 2018-04-05 MED ORDER — ACETAMINOPHEN 325 MG PO TABS
325.0000 mg | ORAL_TABLET | ORAL | Status: DC | PRN
  Administered 2018-04-08 – 2018-04-10 (×2): 650 mg via ORAL
  Filled 2018-04-05 (×2): qty 2

## 2018-04-05 MED ORDER — AMIODARONE HCL 200 MG PO TABS: 200.0000 mg | ORAL_TABLET | Freq: Two times a day (BID) | ORAL | 5 refills | Status: DC

## 2018-04-05 MED ORDER — INSULIN ASPART 100 UNIT/ML ~~LOC~~ SOLN: 0.0000 [IU] | Freq: Every day | SUBCUTANEOUS | Status: DC

## 2018-04-05 MED ORDER — SPIRONOLACTONE 25 MG PO TABS: 25.0000 mg | ORAL_TABLET | Freq: Every day | ORAL | 5 refills | Status: DC

## 2018-04-05 MED ORDER — SPIRONOLACTONE 25 MG PO TABS
25.0000 mg | ORAL_TABLET | Freq: Every day | ORAL | Status: DC
  Administered 2018-04-06 – 2018-04-12 (×7): 25 mg via ORAL
  Filled 2018-04-05 (×7): qty 1

## 2018-04-05 MED ORDER — DIGOXIN 125 MCG PO TABS
0.0625 mg | ORAL_TABLET | Freq: Every day | ORAL | Status: DC
  Administered 2018-04-06 – 2018-04-12 (×7): 0.0625 mg via ORAL
  Filled 2018-04-05 (×7): qty 1

## 2018-04-05 MED ORDER — ENSURE MAX PROTEIN PO LIQD
11.0000 [oz_av] | Freq: Two times a day (BID) | ORAL | Status: DC
  Administered 2018-04-05 – 2018-04-06 (×2): 11 [oz_av] via ORAL
  Filled 2018-04-05 (×3): qty 330

## 2018-04-05 MED ORDER — DIPHENHYDRAMINE HCL 12.5 MG/5ML PO ELIX
12.5000 mg | ORAL_SOLUTION | Freq: Four times a day (QID) | ORAL | Status: DC | PRN
  Administered 2018-04-07 – 2018-04-09 (×2): 25 mg via ORAL
  Filled 2018-04-05 (×2): qty 10

## 2018-04-05 MED ORDER — ALUM & MAG HYDROXIDE-SIMETH 200-200-20 MG/5ML PO SUSP
30.0000 mL | ORAL | Status: DC | PRN
  Administered 2018-04-11: 30 mL via ORAL
  Filled 2018-04-05: qty 30

## 2018-04-05 MED ORDER — TORSEMIDE 20 MG PO TABS: 80.0000 mg | ORAL_TABLET | Freq: Every day | ORAL | 5 refills | Status: DC

## 2018-04-05 MED ORDER — TRAZODONE HCL 50 MG PO TABS: 25.0000 mg | ORAL_TABLET | Freq: Every evening | ORAL | Status: DC | PRN

## 2018-04-05 MED ORDER — ENOXAPARIN SODIUM 40 MG/0.4ML ~~LOC~~ SOLN
40.0000 mg | SUBCUTANEOUS | Status: DC
  Administered 2018-04-05 – 2018-04-11 (×7): 40 mg via SUBCUTANEOUS
  Filled 2018-04-05 (×7): qty 0.4

## 2018-04-05 MED ORDER — TORSEMIDE 20 MG PO TABS
80.0000 mg | ORAL_TABLET | Freq: Every day | ORAL | Status: DC
  Administered 2018-04-06 – 2018-04-12 (×7): 80 mg via ORAL
  Filled 2018-04-05 (×7): qty 4

## 2018-04-05 MED ORDER — PROCHLORPERAZINE MALEATE 5 MG PO TABS
5.0000 mg | ORAL_TABLET | Freq: Four times a day (QID) | ORAL | Status: DC | PRN
  Administered 2018-04-06: 10 mg via ORAL
  Administered 2018-04-08: 5 mg via ORAL
  Administered 2018-04-11: 10 mg via ORAL
  Filled 2018-04-05 (×3): qty 2

## 2018-04-05 MED ORDER — COLCHICINE 0.6 MG PO TABS
0.6000 mg | ORAL_TABLET | Freq: Every day | ORAL | Status: DC
  Administered 2018-04-06 – 2018-04-12 (×7): 0.6 mg via ORAL
  Filled 2018-04-05 (×7): qty 1

## 2018-04-05 MED ORDER — HEPARIN SODIUM (PORCINE) 5000 UNIT/ML IJ SOLN: 5000.0000 [IU] | Freq: Three times a day (TID) | INTRAMUSCULAR | Status: DC

## 2018-04-05 MED ORDER — TORSEMIDE 20 MG PO TABS: 80.0000 mg | ORAL_TABLET | Freq: Every day | ORAL | Status: DC

## 2018-04-05 MED ORDER — GUAIFENESIN-DM 100-10 MG/5ML PO SYRP: 5.0000 mL | ORAL_SOLUTION | Freq: Four times a day (QID) | ORAL | Status: DC | PRN

## 2018-04-05 MED ORDER — SODIUM CHLORIDE 0.9% FLUSH: 10.0000 mL | INTRAVENOUS | Status: DC | PRN

## 2018-04-05 MED ORDER — AMIODARONE HCL 200 MG PO TABS
200.0000 mg | ORAL_TABLET | Freq: Two times a day (BID) | ORAL | Status: DC
  Administered 2018-04-05 – 2018-04-12 (×14): 200 mg via ORAL
  Filled 2018-04-05 (×14): qty 1

## 2018-04-05 MED ORDER — VENLAFAXINE HCL ER 150 MG PO CP24
150.0000 mg | ORAL_CAPSULE | Freq: Every day | ORAL | Status: DC
  Administered 2018-04-06 – 2018-04-12 (×7): 150 mg via ORAL
  Filled 2018-04-05 (×7): qty 1

## 2018-04-05 MED ORDER — DIGOXIN 62.5 MCG PO TABS: 0.0625 mg | ORAL_TABLET | Freq: Every day | ORAL | 5 refills | Status: DC

## 2018-04-05 MED ORDER — ADULT MULTIVITAMIN W/MINERALS CH
1.0000 | ORAL_TABLET | Freq: Every day | ORAL | Status: DC
  Administered 2018-04-06 – 2018-04-12 (×7): 1 via ORAL
  Filled 2018-04-05 (×7): qty 1

## 2018-04-05 MED ORDER — ISOSORB DINITRATE-HYDRALAZINE 20-37.5 MG PO TABS: 1.0000 | ORAL_TABLET | Freq: Three times a day (TID) | ORAL | 5 refills | Status: DC

## 2018-04-05 MED ORDER — LORATADINE 10 MG PO TABS
10.0000 mg | ORAL_TABLET | Freq: Every day | ORAL | Status: DC
  Administered 2018-04-06 – 2018-04-12 (×7): 10 mg via ORAL
  Filled 2018-04-05 (×7): qty 1

## 2018-04-05 MED ORDER — PROCHLORPERAZINE EDISYLATE 10 MG/2ML IJ SOLN: 5.0000 mg | Freq: Four times a day (QID) | INTRAMUSCULAR | Status: DC | PRN

## 2018-04-05 MED ORDER — INSULIN ASPART 100 UNIT/ML ~~LOC~~ SOLN
0.0000 [IU] | Freq: Three times a day (TID) | SUBCUTANEOUS | Status: DC
  Administered 2018-04-07 – 2018-04-11 (×3): 1 [IU] via SUBCUTANEOUS

## 2018-04-05 MED ORDER — HYDROXYCHLOROQUINE SULFATE 200 MG PO TABS
200.0000 mg | ORAL_TABLET | Freq: Two times a day (BID) | ORAL | Status: DC
  Administered 2018-04-05 – 2018-04-12 (×14): 200 mg via ORAL
  Filled 2018-04-05 (×14): qty 1

## 2018-04-05 MED ORDER — COLCHICINE 0.6 MG PO TABS: 0.6000 mg | ORAL_TABLET | Freq: Every day | ORAL | 0 refills | Status: DC

## 2018-04-05 MED ORDER — SODIUM CHLORIDE 0.9% FLUSH: 10.0000 mL | Freq: Two times a day (BID) | INTRAVENOUS | Status: DC

## 2018-04-05 MED ORDER — RANOLAZINE ER 500 MG PO TB12
500.0000 mg | ORAL_TABLET | Freq: Two times a day (BID) | ORAL | Status: DC
  Administered 2018-04-05 – 2018-04-12 (×14): 500 mg via ORAL
  Filled 2018-04-05 (×14): qty 1

## 2018-04-05 MED ORDER — PROCHLORPERAZINE 25 MG RE SUPP: 12.5000 mg | Freq: Four times a day (QID) | RECTAL | Status: DC | PRN

## 2018-04-05 MED ORDER — DOCUSATE SODIUM 100 MG PO CAPS
100.0000 mg | ORAL_CAPSULE | Freq: Two times a day (BID) | ORAL | Status: DC
  Administered 2018-04-05 – 2018-04-12 (×14): 100 mg via ORAL
  Filled 2018-04-05 (×14): qty 1

## 2018-04-05 MED ORDER — CLOPIDOGREL BISULFATE 75 MG PO TABS
75.0000 mg | ORAL_TABLET | Freq: Every day | ORAL | Status: DC
  Administered 2018-04-06 – 2018-04-12 (×7): 75 mg via ORAL
  Filled 2018-04-05 (×7): qty 1

## 2018-04-05 MED ORDER — ISOSORB DINITRATE-HYDRALAZINE 20-37.5 MG PO TABS
1.0000 | ORAL_TABLET | Freq: Three times a day (TID) | ORAL | Status: DC
  Administered 2018-04-05 – 2018-04-11 (×18): 1 via ORAL
  Filled 2018-04-05 (×19): qty 1

## 2018-04-05 MED ORDER — MOMETASONE FURO-FORMOTEROL FUM 200-5 MCG/ACT IN AERO
2.0000 | INHALATION_SPRAY | Freq: Two times a day (BID) | RESPIRATORY_TRACT | Status: DC
  Administered 2018-04-05 – 2018-04-11 (×13): 2 via RESPIRATORY_TRACT

## 2018-04-05 NOTE — Discharge Summary (Signed)
Physical Medicine and Rehabilitation Admission H&P    CC: Debility.    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  GI work-up revealed mildly elevated LFTs due to cholestasis but no evidence of obstruction.  GI symptoms felt to be due to amiodarone and dose was decreased.  Patient was noted to be markedly volume overloaded but was unable to tolerate diuresis due to acute on chronic renal failure as well as low blood pressures.  She was transferred to [facility] on 03/24/18 for management by heart failure team.  She was placed dobutamine and has tolerated diuresis with resolution of fluid overload. Dobutamine weaned off by 3/15 and medications being adjusted with weight down from 134.7 kg--> 101.1 kg. AKI being monitored and serum creatinine noted to be trending back up therefore Demadex held today--to resume at lower dose tomorrow.  Hospital course significant for issues with left shoulder pain felt to be due to gout flare, severe constipation requiring disimpaction, hemorrhoidal flare as well as intermittent issues with nausea.  Respiratory status and activity tolerance has improved but patient noted to be deconditioned.  Therapy evaluations revealed functional decline and CIR recommended for follow-up therapy   Review of Systems  Constitutional: Negative for chills and fever.  HENT: Negative for hearing loss and tinnitus.   Eyes: Negative for blurred vision.       Left lateral visual field deficits due to stroke.   Respiratory: Negative for cough and shortness of breath.   Cardiovascular: Positive for leg swelling. Negative for chest pain and palpitations.  Gastrointestinal: Positive for nausea and vomiting. Negative for constipation and heartburn.  Genitourinary: Negative for dysuria and  urgency.  Musculoskeletal: Positive for falls (due to syncope/VT episodes) and myalgias. Negative for joint pain.  Skin: Negative for rash.  Neurological: Positive for focal weakness. Negative for dizziness, tremors and headaches.  Psychiatric/Behavioral: The patient is not nervous/anxious and does not have insomnia.     Past Medical History:  Diagnosis Date  . Anxiety   . Arthritis   . Asthma   . Bronchitis   . CHF (congestive heart failure) ([place])    a. EF 20-25% by cath in 11/2010 with cath showing normal cors  . CKD (chronic kidney disease) stage 3, GFR 30-59 ml/min (HCC)   . Fibromyalgia   . GERD (gastroesophageal reflux disease)   . Hallux valgus of left foot   . Hay fever   . Headache(784.0)   . History of pneumonia   . History of seizures   . Hyperlipidemia   . Hypertension   . Nonischemic cardiomyopathy ([place])   . Obesity   . Stroke ([place])   . Systemic lupus erythematosus ([place])   . Type 2 diabetes mellitus ([place])     Past Surgical History:  Procedure Laterality Date  . ICD  02/13/2011   Medtronic Protecta DR XT  . ICD GENERATOR CHANGEOUT N/A 01/07/2017   Procedure: [place];  Surgeon: Evans Lance, MD;  Location: [place] CV LAB;  Service: Cardiovascular;  Laterality: N/A;  . IMPLANTABLE CARDIOVERTER DEFIBRILLATOR IMPLANT N/A 02/13/2011   Procedure: IMPLANTABLE CARDIOVERTER DEFIBRILLATOR IMPLANT;  Surgeon: Sanda Klein, MD;  Location: [place] CATH LAB;  Service: Cardiovascular;  Laterality: N/A;  . LEFT HEART CATHETERIZATION WITH CORONARY ANGIOGRAM N/A 12/05/2010  Procedure: LEFT HEART CATHETERIZATION WITH CORONARY ANGIOGRAM;  Surgeon: Pixie Casino, MD;  Location: [facility] CATH LAB;  Service: Cardiovascular;  Laterality: N/A;  . RIGHT ANKLE    . TUBAL LIGATION    . US ECHOCARDIOGRAPHY  11/09/2010   mild LV enlargement w/conc. LVH & severe global hypokinesis EF 30-35%,mod. diastolic dysfunction, mod. TR,mild AI,mild to mod MR,mild PI    Family  History  Problem Relation Age of Onset  . Heart failure Mother   . Diabetes Father   . Hypertension Sister   . Hypertension Brother   . Hypertension Daughter     Social History:  Lives with boyfriend (who does not work). Disabled due to multiple medical issues. She was independent but has been limited by DOE since August 2019.  She  reports that she quit smoking about 12 years ago. Her smoking use included cigarettes. She started smoking about 38 years ago. She has a 26.00 pack-year smoking history. She has never used smokeless tobacco. She reports previous alcohol use. She reports that she does not use drugs.    Allergies  Allergen Reactions  . Aspirin Other (See Comments)  . Penicillins Itching    Can take amoxicillin without a reaction  . Shellfish Allergy Other (See Comments)    Causes to have grand mal seizures    Medications Prior to Admission  Medication Sig Dispense Refill  . albuterol (PROVENTIL HFA;VENTOLIN HFA) 108 (90 BASE) MCG/ACT inhaler Inhale 2 puffs into the lungs 4 (four) times daily as needed. For wheezing and shortness of breath    . alum & mag hydroxide-simeth (MAALOX/MYLANTA) 200-200-20 MG/5ML suspension Take 30 mLs by mouth every 6 (six) hours as needed for indigestion or heartburn. 355 mL 0  . amiodarone (PACERONE) 200 MG tablet Take 1 tablet (200 mg total) by mouth every 6 (six) hours.    . cetirizine (ZYRTEC) 10 MG tablet Take 10 mg by mouth daily.     . chlorhexidine (PERIDEX) 0.12 % solution 15 mLs by Mouth Rinse route 2 (two) times daily. 120 mL 0  . clopidogrel (PLAVIX) 75 MG tablet Take 1 tablet (75 mg total) by mouth daily. 90 tablet 3  . COD LIVER OIL PO Take 1 tablet by mouth daily.     Marland Kitchen dimenhyDRINATE (DRAMAMINE) 50 MG tablet Take 0.5 tablets (25 mg total) by mouth every 8 (eight) hours as needed for dizziness. 30 tablet 0  . diphenhydrAMINE (BENADRYL) 25 mg capsule Take 1 capsule (25 mg total) by mouth every 6 (six) hours as needed for itching. 30  capsule 0  . furosemide (LASIX) 80 MG tablet Take 1 tablet (80 mg total) by mouth daily. 30 tablet 11  . guaiFENesin (ROBITUSSIN) 100 MG/5ML SOLN Take 5 mLs (100 mg total) by mouth every 4 (four) hours as needed for cough or to loosen phlegm. 236 mL 0  . HUMALOG MIX 50/50 KWIKPEN (50-50) 100 UNIT/ML Kwikpen INJECT 30 UNITS INTO THE SKIN TWICE DAILY. 15 mL 2  . hydrALAZINE (APRESOLINE) 10 MG tablet Take 2 tablets (20 mg total) by mouth every 8 (eight) hours. 180 tablet 3  . hydrocortisone cream 0.5 % Apply topically 3 (three) times daily as needed for itching. 30 g 0  . hydroxychloroquine (PLAQUENIL) 200 MG tablet Take 200 mg by mouth 2 (two) times daily.    . insulin aspart (NOVOLOG) 100 UNIT/ML injection Inject 0-9 Units into the skin 3 (three) times daily with meals. 10 mL 11  . insulin aspart (NOVOLOG) 100 UNIT/ML injection  Inject 0-5 Units into the skin at bedtime. 10 mL 11  . isosorbide mononitrate (IMDUR) 30 MG 24 hr tablet Take 1 tablet (30 mg total) by mouth daily. 30 tablet 3  . loratadine (CLARITIN) 10 MG tablet Take 1 tablet (10 mg total) by mouth daily as needed for allergies or rhinitis.    . Magnesium 400 MG TABS Take 400 mg by mouth daily for 30 days. 30 tablet 0  . metoprolol succinate (TOPROL-XL) 50 MG 24 hr tablet Take 1 tablet (50 mg total) by mouth daily. Take with or immediately following a meal. 30 tablet 3  . mometasone-formoterol (DULERA) 200-5 MCG/ACT AERO Inhale 2 puffs into the lungs 2 (two) times daily. 8.8 g 3  . Multiple Vitamins-Minerals (MULTIVITAMIN WOMEN 50+ PO) Take 1 tablet by mouth daily.    . ondansetron (ZOFRAN) 4 MG tablet Take 1 tablet (4 mg total) by mouth every 8 (eight) hours as needed for nausea or vomiting. 20 tablet 0  . ondansetron (ZOFRAN) 4 MG/2ML SOLN injection Inject 2 mLs (4 mg total) into the vein every 6 (six) hours as needed for nausea. 2 mL 0  . polyethylene glycol (MIRALAX / GLYCOLAX) packet Take 17 g by mouth daily as needed for moderate  constipation. 14 each 0  . potassium chloride SA (K-DUR,KLOR-CON) 20 MEQ tablet Take 2 tablets (40 mEq total) by mouth daily. 60 tablet 3  . promethazine (PHENERGAN) 25 MG/ML injection Inject 1 mL (25 mg total) into the vein every 6 (six) hours as needed for nausea or vomiting. 1 mL 0  . ranolazine (RANEXA) 500 MG 12 hr tablet Take 1 tablet (500 mg total) by mouth 2 (two) times daily.    . simvastatin (ZOCOR) 20 MG tablet TAKE ONE TABLET BY MOUTH AT BEDTIME. 30 tablet 0  . sodium chloride (OCEAN) 0.65 % SOLN nasal spray Place 1 spray into both nostrils as needed for congestion.  0  . venlafaxine XR (EFFEXOR-XR) 150 MG 24 hr capsule Take 150 mg by mouth daily.       Drug Regimen Review  Drug regimen was reviewed and remains appropriate with no significant issues identified  Home: Home Living Family/patient expects to be discharged to:: Private residence Living Arrangements: Alone Available Help at Discharge: Friend(s), Family, Available PRN/intermittently Type of Home: House Home Access: Stairs to enter [employer] of Steps: 4(pt thinks someone is building a ramp) Entrance Stairs-Rails: Right Home Layout: One level Bathroom Shower/Tub: Multimedia programmer: Standard Home Equipment: Geneticist, molecular, Wheelchair - manual, Environmental consultant - 4 wheels, Environmental consultant - 2 wheels, Grab bars - tub/shower   Functional History: Prior Function Level of Independence: Needs assistance Gait / Transfers Assistance Needed: Pt reports ambulatory with RW at home; sometimes needs help from family to get out of bed and to/from wheelchair. Reports increased difficulty indep pushing manual w/c, wants an electric scooter ADL's / Homemaking Assistance Needed: PRN assist from family for ADLs; reports she had supervision for shower transfers from a friend Comments: Reports 2 falls in past 6 months  Functional Status:  Mobility: Bed Mobility Overal bed mobility: Needs Assistance Bed Mobility: Supine to Sit  Supine to sit: Supervision, HOB elevated Sit to supine: Supervision, HOB elevated General bed mobility comments: +rail, increased time and effort Transfers Overall transfer level: Needs assistance Equipment used: Rolling walker (2 wheeled) Transfers: Sit to/from Stand Sit to Stand: Min assist, From elevated surface Stand pivot transfers: Min guard General transfer comment: cues for hand placement, multi attempts to stand  using momentum Ambulation/Gait Ambulation/Gait assistance: Min guard, Min assist Gait Distance (Feet): 50 Feet Assistive device: Rolling walker (2 wheeled) Gait Pattern/deviations: Step-through pattern, Decreased stride length, Wide base of support General Gait Details: unsteady, mild steppage gait which increases with fatigue. Min guard assist straight, level surfaces. LOB during turn requiring min assist to maintain balance. Gait velocity: decreased Gait velocity interpretation: <1.31 ft/sec, indicative of household ambulator    ADL: ADL Overall ADL's : Needs assistance/impaired Eating/Feeding: Set up, Sitting Grooming: Set up, Oral care, Sitting Upper Body Bathing: Supervision/ safety, Sitting Lower Body Bathing: Moderate assistance, +2 for physical assistance, +2 for safety/equipment, Sit to/from stand Lower Body Bathing Details (indicate cue type and reason): assist to cleanse buttocks in standing Upper Body Dressing : Minimal assistance, Sitting Lower Body Dressing: Set up, Sitting/lateral leans, Cueing for compensatory techniques Lower Body Dressing Details (indicate cue type and reason): pt. able to cross each leg over knee to don sneakers. max cues for initiation and completion of rest breaks in between each shoe. HR fluctuating from 86-88 with activity.   Toilet Transfer: Min guard, RW, Ambulation, [facility] Toilet Transfer Details (indicate cue type and reason): simulated in room with transfer from bed to chair Toileting- Clothing Manipulation and Hygiene:  Moderate assistance, Sit to/from stand Toileting - Clothing Manipulation Details (indicate cue type and reason): no demonstrated during session as pt. did not actually use the b.room, only transfer Functional mobility during ADLs: Minimal assistance, Rolling walker General ADL Comments: pt. able to describe how she was feeling when needing a rest break prior to admit: (dizzy, "feel it in my chest") but unable to initiate rest breaks without cues, and once initiated was unable to sustatin the full rest break without attempting to do other tasks.   Cognition: Cognition Overall Cognitive Status: Within Functional Limits for tasks assessed Orientation Level: Oriented X4 Cognition Arousal/Alertness: Awake/alert Behavior During Therapy: WFL for tasks assessed/performed Overall Cognitive Status: Within Functional Limits for tasks assessed Area of Impairment: Attention, Memory, Following commands, Safety/judgement, Problem solving Current Attention Level: Sustained Memory: Decreased short-term memory Following Commands: Follows one step commands consistently Safety/Judgement: Decreased awareness of safety, Decreased awareness of deficits Problem Solving: Slow processing, Requires verbal cues General Comments: Fluctuating attention during tasks, Pt pleasant when re-assured and encouraged.   Physical Exam: Blood pressure 93/69, pulse 84, temperature 98.3 F (36.8 C), temperature source Oral, resp. rate 19, height 6' (1.829 m), weight 101.1 kg, last menstrual period 01/30/2011, SpO2 99 %. Physical Exam  Constitutional: She is oriented to person, place, and time. She appears well-developed and well-nourished. No distress.  HENT:  Head: Normocephalic and atraumatic.  Right facial fullness.   Eyes: Pupils are equal, round, and reactive to light. EOM are normal.  Neck: Normal range of motion. No tracheal deviation present. No thyromegaly present.  Cardiovascular: Normal rate and regular rhythm. Exam  reveals no friction rub.  No murmur heard. Respiratory: Effort normal. No stridor. No respiratory distress. She has no wheezes.  GI: Soft. She exhibits no distension. There is no abdominal tenderness.  Musculoskeletal: Normal range of motion.        General: No deformity.     Comments: BLE with 1+pedal edema and unna boots in place.  Mild pain with ABD/ER/IR left shoulder  Neurological: She is alert and oriented to person, place, and time.  Speech clear. Able to follow basic commands without difficulty. RUE 5/5. LUE 4+/5. RLE 3-4/5 prox to distal. LLE 3- to 4/5 prox to distal. No focal sensory  loss, intact insight and awareness. Normal language  Skin:  Multiple healed lesions on BUE. Bilateral knees with scabbed abrasions.   Psychiatric: She has a normal mood and affect. Her behavior is normal.    Results for orders placed or performed during the hospital encounter of 03/24/18 (from the past 48 hour(s))  Glucose, capillary     Status: Abnormal   Collection Time: 04/03/18  4:35 PM  Result Value Ref Range   Glucose-Capillary 109 (H) 70 - 99 mg/dL   Comment 1 Notify RN    Comment 2 Document in Chart   Glucose, capillary     Status: None   Collection Time: 04/03/18  9:37 PM  Result Value Ref Range   Glucose-Capillary 97 70 - 99 mg/dL  .Cooxemetry Panel (carboxy, met, total hgb, O2 sat)     Status: Abnormal   Collection Time: 04/04/18  5:30 AM  Result Value Ref Range   Total hemoglobin 11.2 (L) 12.0 - 16.0 g/dL   O2 Saturation 71.2 %   Carboxyhemoglobin 1.9 (H) 0.5 - 1.5 %   Methemoglobin 1.0 0.0 - 1.5 %  Basic metabolic panel     Status: Abnormal   Collection Time: 04/04/18  5:56 AM  Result Value Ref Range   Sodium 137 135 - 145 mmol/L   Potassium 3.2 (L) 3.5 - 5.1 mmol/L   Chloride 99 98 - 111 mmol/L   CO2 26 22 - 32 mmol/L   Glucose, Bld 98 70 - 99 mg/dL   BUN 22 (H) 6 - 20 mg/dL   Creatinine, Ser 1.98 (H) 0.44 - 1.00 mg/dL   Calcium 8.4 (L) 8.9 - 10.3 mg/dL   GFR calc non Af  Amer 27 (L) >60 mL/min   GFR calc Af Amer 32 (L) >60 mL/min   Anion gap 12 5 - 15    Comment: Performed at [facility] [address] [place], [place] [address]  CBC with Differential/Platelet     Status: Abnormal   Collection Time: 04/04/18  5:56 AM  Result Value Ref Range   WBC 9.7 4.0 - 10.5 K/uL   RBC 4.02 3.87 - 5.11 MIL/uL   Hemoglobin 12.4 12.0 - 15.0 g/dL   HCT 36.8 36.0 - 46.0 %   MCV 91.5 80.0 - 100.0 fL   MCH 30.8 26.0 - 34.0 pg   MCHC 33.7 30.0 - 36.0 g/dL   RDW 18.6 (H) 11.5 - 15.5 %   Platelets 430 (H) 150 - 400 K/uL   nRBC 0.0 0.0 - 0.2 %   Neutrophils Relative % 55 %   Neutro Abs 5.4 1.7 - 7.7 K/uL   Lymphocytes Relative 29 %   Lymphs Abs 2.8 0.7 - 4.0 K/uL   Monocytes Relative 10 %   Monocytes Absolute 1.0 0.1 - 1.0 K/uL   Eosinophils Relative 3 %   Eosinophils Absolute 0.3 0.0 - 0.5 K/uL   Basophils Relative 1 %   Basophils Absolute 0.1 0.0 - 0.1 K/uL   Immature Granulocytes 2 %   Abs Immature Granulocytes 0.15 (H) 0.00 - 0.07 K/uL    Comment: Performed at [facility] [address], [place], [place] [address]  Glucose, capillary     Status: None   Collection Time: 04/04/18  7:29 AM  Result Value Ref Range   Glucose-Capillary 99 70 - 99 mg/dL  Glucose, capillary     Status: Abnormal   Collection Time: 04/04/18 11:17 AM  Result Value Ref Range   Glucose-Capillary  124 (H) 70 - 99 mg/dL   Comment 1 Notify RN    Comment 2 Document in Chart   Glucose, capillary     Status: Abnormal   Collection Time: 04/04/18  4:11 PM  Result Value Ref Range   Glucose-Capillary 101 (H) 70 - 99 mg/dL   Comment 1 Notify RN    Comment 2 Document in Chart   Glucose, capillary     Status: None   Collection Time: 04/04/18  9:04 PM  Result Value Ref Range   Glucose-Capillary 90 70 - 99 mg/dL  .Cooxemetry Panel (carboxy, met, total hgb, O2 sat)     Status: None   Collection Time: 04/05/18  4:19 AM  Result Value Ref Range   Total hemoglobin 13.8 12.0 - 16.0  g/dL   O2 Saturation 47.8 %   Carboxyhemoglobin 1.3 0.5 - 1.5 %   Methemoglobin 0.8 0.0 - 1.5 %  Basic metabolic panel     Status: Abnormal   Collection Time: 04/05/18  4:27 AM  Result Value Ref Range   Sodium 136 135 - 145 mmol/L   Potassium 4.1 3.5 - 5.1 mmol/L   Chloride 96 (L) 98 - 111 mmol/L   CO2 28 22 - 32 mmol/L   Glucose, Bld 121 (H) 70 - 99 mg/dL   BUN 22 (H) 6 - 20 mg/dL   Creatinine, Ser 2.19 (H) 0.44 - 1.00 mg/dL   Calcium 9.5 8.9 - 10.3 mg/dL   GFR calc non Af Amer 24 (L) >60 mL/min   GFR calc Af Amer 28 (L) >60 mL/min   Anion gap 12 5 - 15    Comment: Performed at [facility], [place] [address], [place], [place] [address]  CBC with Differential/Platelet     Status: Abnormal   Collection Time: 04/05/18  4:27 AM  Result Value Ref Range   WBC 7.9 4.0 - 10.5 K/uL   RBC 4.36 3.87 - 5.11 MIL/uL   Hemoglobin 13.3 12.0 - 15.0 g/dL   HCT 40.3 36.0 - 46.0 %   MCV 92.4 80.0 - 100.0 fL   MCH 30.5 26.0 - 34.0 pg   MCHC 33.0 30.0 - 36.0 g/dL   RDW 18.5 (H) 11.5 - 15.5 %   Platelets 483 (H) 150 - 400 K/uL   nRBC 0.0 0.0 - 0.2 %   Neutrophils Relative % 45 %   Neutro Abs 3.6 1.7 - 7.7 K/uL   Lymphocytes Relative 41 %   Lymphs Abs 3.2 0.7 - 4.0 K/uL   Monocytes Relative 8 %   Monocytes Absolute 0.6 0.1 - 1.0 K/uL   Eosinophils Relative 4 %   Eosinophils Absolute 0.3 0.0 - 0.5 K/uL   Basophils Relative 1 %   Basophils Absolute 0.1 0.0 - 0.1 K/uL   Immature Granulocytes 1 %   Abs Immature Granulocytes 0.07 0.00 - 0.07 K/uL    Comment: Performed at [place] [place] [address], [place],  [address]  Digoxin level     Status: Abnormal   Collection Time: 04/05/18  4:27 AM  Result Value Ref Range   Digoxin Level <0.2 (L) 0.8 - 2.0 ng/mL    Comment: RESULTS CONFIRMED BY MANUAL DILUTION Performed at [facility], [place] [address] [place], [place] [address]   Glucose, capillary     Status: None   Collection Time: 04/05/18  6:53 AM  Result Value Ref  Range   Glucose-Capillary 99 70 - 99 mg/dL   Comment 1  Notify RN    Comment 2 Document in Chart   .Cooxemetry Panel (carboxy, met, total hgb, O2 sat)     Status: None   Collection Time: 04/05/18 10:30 AM  Result Value Ref Range   Total hemoglobin 13.1 12.0 - 16.0 g/dL   O2 Saturation 56.0 %   Carboxyhemoglobin 1.5 0.5 - 1.5 %   Methemoglobin 0.9 0.0 - 1.5 %  Glucose, capillary     Status: Abnormal   Collection Time: 04/05/18 11:22 AM  Result Value Ref Range   Glucose-Capillary 156 (H) 70 - 99 mg/dL   Comment 1 Notify RN    Comment 2 Document in Chart    No results found.     Medical Problem List and Plan: 1.  Functional and mobility deficits secondary to congestive heart failure and multiple medical issues  -admit to inpatient rehab 2.  Antithrombotics: -DVT/anticoagulation:  Pharmaceutical: Lovenox  -antiplatelet therapy: On Plavix daily.   3. Fibromyalgia/Lupus/Pain Management: Tylenol prn. 4. Mood: LCSW to follow for evaluation and support.   -antipsychotic agents: N/A 5. Neuropsych: This patient is capable of making decisions on her own behalf. 6. Skin/Wound Care: Unna boots replaced 3/17. Add ensure max to help with protein stores/ wound healing.  7. Fluids/Electrolytes/Nutrition: Monitor I/O. Check lytes in am. Continue 2000cc/FR.  8. NICM/VT s/p ICD:  On Ranolazine.  Reported to have 17 ICD discharges since 01/2018. Amiodarone decreased to 200 mg bid due to tremors. BB on hold due to low output.  9. SLE: controlled on hydroxychloroquine.  10. Acute renal failure: Serum Creatinine trending back up. Recheck labs daily for now.  11. Gout flare: Treated with 3 day course of prednisone. Continue colchicine.  12. Acute on chronic systolic CHF: Likely exacerbated by VT. Monitor weights daily and for signs of overload. SBP ranging in 90's today but asymptomatic. Continue spironolactone, digoxin and bidil.  Torsemide 20 mg to resume tomorrow.  13. Hypokalemia: Resolved with  intermittent supplement. Recheck lytes in am.  14. T2DM: Hgb A1C- 6.8. Continue to  Monitor ac/hs --currently controlled on SSI alone but intake poor. Will monitor trend and resume 50/50 insulin as indicated.       Bary Leriche, PA-C 04/05/2018

## 2018-04-05 NOTE — Progress Notes (Signed)
IV team removed PICC and pt stayed in the bed for 30 min. PT was walking with patient. Transferred patient to inpatient rehab. HS [employer]

## 2018-04-05 NOTE — Progress Notes (Signed)
Trish Mage, RN  Rehab Admission Coordinator  Physical Medicine and Rehabilitation  PMR Pre-admission  Signed  Date of Service:  04/04/2018 2:44 PM       Related encounter: Admission (Current) from 03/24/2018 in [place] [facility] CV PROGRESSIVE CARE      Signed         Show:Clear all [x] Manual[x] Template[x] Copied  Added by: [x] Trish Mage, RN  [] Hover for details PMR Admission Coordinator Pre-Admission Assessment  Patient: Katherine [name] is an 58 y.o., female MRN: [id number] DOB: [birth year] Height: 6' (182.9 cm) Weight: 101.1 kg                                                                                                                                             Insurance Information HMO: No    PPO:       PCP:       IPA:       80/20:       OTHER:   PRIMARY:  Medicaid [place] access      Policy#: [id number]      Subscriber: patient CM Name:        Phone#:       Fax#:   Pre-Cert#:        Employer: disabled Benefits:  Phone #: [phone]     Name: Automated Eff. Date: eligible 04/04/18 with coverage code [id number]     Deduct:        Out of Pocket Max:        Life Max:   CIR:        SNF:   Outpatient:       Co-Pay:   Home Health:        Co-Pay:   DME:       Co-Pay:   Providers:    Medicaid Application Date:       Case Manager:  Disability Application Date:       Case Worker:   Emergency Contact Information         Contact Information    Name Relation Home Work Mobile   [place] Friend   [phone]     Current Medical History  Patient Admitting Diagnosis:  Debility  History of Present Illness: A 58 y.o.femalewithwith history of chronic systolic HF secondary to anNICM s/pICD, CVA with residual left-sided weakness, V-tach/V-Fib-recently started on amiodarone, T2DM, CKD who was admitted to [facility]/29/2020with nausea vomiting, worsening of dyspnea as well as orthopnea. History taken from chart review and patient.GI work-up revealed mildly  elevated LFTs to cholestasis but no evidence of diabetes obstruction. GI symptoms felt to be due to amiodarone and dose was decreased.Patient with markedly volume overload but unable to tolerate due to acute chronic renal failure with low blood pressures. She was transferred to [facility] on 01/23/2018 regimen.She was started on dobutamineand heart failure team following for aggressive diuresis. Weights trending down and  dobutamine discontinued yesterday with up titration of medicationsand SCr back to baseline?She developed shoulder pain and treated for gout flare.She had issues with hemorrhoidal flare due to constipation --bowel program adjusted and reports that nausea has resolved with disimpaction.Amiodarone decreased to 200 mg bid with resolution of tremors.   She is noted to be deconditioned and is refusing SNF. MD recommending CIR due to functional decline.She reports that fluid overload and respiratory status have greatly improvedand she is able to tolerate sitting up in chair 3-5 hours daily. Lives with boyfriend who manages home and has been providing assistance for the past 6+months due to cardiac issues. She reports boyfriendcan provide assistance as needed past discharge and would prefer to go [place] post[place]. She is agreeable to inpatient rehab after discussion.  Past Medical History      Past Medical History:  Diagnosis Date  . Anxiety   . Arthritis   . Asthma   . Bronchitis   . CHF (congestive heart failure) (HCC)    a. EF 20-25% by cath in 11/2010 with cath showing normal cors  . CKD (chronic kidney disease) stage 3, GFR 30-59 ml/min (HCC)   . Fibromyalgia   . GERD (gastroesophageal reflux disease)   . Hallux valgus of left foot   . Hay fever   . Headache(784.0)   . History of pneumonia   . History of seizures   . Hyperlipidemia   . Hypertension   . Nonischemic cardiomyopathy (HCC)   . Obesity   . Stroke (HCC)    . Systemic lupus erythematosus (HCC)   . Type 2 diabetes mellitus (HCC)     Family History  family history includes Diabetes in her father; Heart failure in her mother; Hypertension in her brother, daughter, and sister.  Prior Rehab/Hospitalizations: Patient reports that she has had 6-7 hospital admissions in the past year.  Has the patient had major surgery during 100 days prior to admission? No  Current Medications   Current Facility-Administered Medications:  .  0.9 %  sodium chloride infusion, 250 mL, Intravenous, PRN, Irene Limbo, Callie E, PA-C .  Place/Maintain arterial line, , , Until Discontinued **AND** 0.9 %  sodium chloride infusion, , Intra-arterial, PRN, Laurey Morale, MD .  acetaminophen (TYLENOL) tablet 650 mg, 650 mg, Oral, Q4H PRN, Corrin Parker, PA-C, 650 mg at 03/25/18 1709 .  amiodarone (PACERONE) tablet 200 mg, 200 mg, Oral, BID, Laurey Morale, MD, 200 mg at 04/05/18 0855 .  bisacodyl (DULCOLAX) EC tablet 5 mg, 5 mg, Oral, Daily PRN, Laurey Morale, MD .  clopidogrel (PLAVIX) tablet 75 mg, 75 mg, Oral, Daily, Marjie Skiff E, PA-C, 75 mg at 04/05/18 0855 .  colchicine tablet 0.6 mg, 0.6 mg, Oral, Daily, Graciella Freer, PA-C, 0.6 mg at 04/05/18 0854 .  digoxin (LANOXIN) tablet 0.0625 mg, 0.0625 mg, Oral, Daily, Laurey Morale, MD, 0.0625 mg at 04/05/18 0855 .  diphenhydrAMINE (BENADRYL) injection 12.5 mg, 12.5 mg, Intravenous, Q8H PRN, Laurey Morale, MD, 12.5 mg at 03/31/18 1939 .  docusate sodium (COLACE) capsule 100 mg, 100 mg, Oral, BID, Laurey Morale, MD, 100 mg at 04/03/18 1006 .  feeding supplement (GLUCERNA SHAKE) (GLUCERNA SHAKE) liquid 237 mL, 237 mL, Oral, TID BM, End, Christopher, MD, 237 mL at 04/05/18 0857 .  heparin injection 5,000 Units, 5,000 Units, Subcutaneous, Q8H, Corrin Parker, PA-C, 5,000 Units at 04/05/18 3888 .  hydroxychloroquine (PLAQUENIL) tablet 200 mg, 200 mg, Oral, BID, Marjie Skiff  E, PA-C,  200 mg at 04/05/18 0854 .  insulin aspart (novoLOG) injection 0-5 Units, 0-5 Units, Subcutaneous, QHS, Corrin Parker, PA-C, 3 Units at 03/30/18 2247 .  insulin aspart (novoLOG) injection 0-9 Units, 0-9 Units, Subcutaneous, TID WC, Goodrich, Callie E, PA-C, 2 Units at 04/05/18 1145 .  isosorbide-hydrALAZINE (BIDIL) 20-37.5 MG per tablet 1 tablet, 1 tablet, Oral, TID, Laurey Morale, MD, 1 tablet at 04/05/18 [phone] .  loratadine (CLARITIN) tablet 10 mg, 10 mg, Oral, Daily, Marjie Skiff E, PA-C, 10 mg at 04/05/18 0854 .  mometasone-formoterol (DULERA) 200-5 MCG/ACT inhaler 2 puff, 2 puff, Inhalation, BID, Corrin Parker, PA-C, 2 puff at 04/05/18 0818 .  multivitamin with minerals tablet 1 tablet, 1 tablet, Oral, Daily, End, Christopher, MD, 1 tablet at 04/05/18 0854 .  oxyCODONE-acetaminophen (PERCOCET/ROXICET) 5-325 MG per tablet 1 tablet, 1 tablet, Oral, Q6H PRN, Leone Brand, NP, 1 tablet at 04/01/18 2149 .  prochlorperazine (COMPAZINE) injection 5 mg, 5 mg, Intravenous, Q6H PRN, Marjie Skiff E, PA-C, 5 mg at 04/05/18 1151 .  ranolazine (RANEXA) 12 hr tablet 500 mg, 500 mg, Oral, BID, Marjie Skiff E, PA-C, 500 mg at 04/05/18 0855 .  sodium chloride flush (NS) 0.9 % injection 10-40 mL, 10-40 mL, Intracatheter, Q12H, End, Christopher, MD, 10 mL at 04/05/18 0858 .  sodium chloride flush (NS) 0.9 % injection 10-40 mL, 10-40 mL, Intracatheter, PRN, End, Christopher, MD .  spironolactone (ALDACTONE) tablet 25 mg, 25 mg, Oral, Daily, Laurey Morale, MD, 25 mg at 04/05/18 0854 .  venlafaxine XR (EFFEXOR-XR) 24 hr capsule 150 mg, 150 mg, Oral, Q breakfast, Corrin Parker, PA-C, 150 mg at 04/05/18 0607  Patients Current Diet:     Diet Order                  Diet heart healthy/carb modified Room service appropriate? Yes; Fluid consistency: Thin; Fluid restriction: 2000 mL Fluid  Diet effective now               Precautions / Restrictions Precautions  Precautions: Fall Restrictions Weight Bearing Restrictions: No   Has the patient had 2 or more falls or a fall with injury in the past year?Yes.  Patient reports 2-3 falls in the past 6 months with minor injuries.  Prior Activity Level Community (5-7x/wk): goes out 4-5 X a week.  BF or a friend drives.  Not working.  Home Assistive Devices / Equipment Home Assistive Devices/Equipment: Wheelchair, Environmental consultant (specify type) Home Equipment: Toilet riser, Wheelchair - manual, Environmental consultant - 4 wheels, Walker - 2 wheels, Grab bars - tub/shower  Prior Device Use: Indicate devices/aids used by the patient prior to current illness, exacerbation or injury? Walker  Prior Functional Level Prior Function Level of Independence: Needs assistance Gait / Transfers Assistance Needed: Pt reports ambulatory with RW at home; sometimes needs help from family to get out of bed and to/from wheelchair. Reports increased difficulty indep pushing manual w/c, wants an electric scooter ADL's / Homemaking Assistance Needed: PRN assist from family for ADLs; reports she had supervision for shower transfers from a friend Comments: Reports 2 falls in past 6 months  Self Care: Did the patient need help bathing, dressing, using the toilet or eating?  Needed some help  Indoor Mobility: Did the patient need assistance with walking from room to room (with or without device)? Independent  Stairs: Did the patient need assistance with internal or external stairs (with or without device)? Needed some help  Functional Cognition:  Did the patient need help planning regular tasks such as shopping or remembering to take medications? Independent  Current Functional Level Cognition  Overall Cognitive Status: Within Functional Limits for tasks assessed Current Attention Level: Sustained Orientation Level: Oriented X4 Following Commands: Follows one step commands consistently Safety/Judgement: Decreased awareness of safety,  Decreased awareness of deficits General Comments: Fluctuating attention during tasks, Pt pleasant when re-assured and encouraged.     Extremity Assessment (includes Sensation/Coordination)  Upper Extremity Assessment: RUE deficits/detail, LUE deficits/detail RUE Deficits / Details: increased pain with elbow ROM requiring increased effort to perform, generalized weakness  LUE Deficits / Details: increased pain with elbow ROM requiring increased effort to perform, generalized weakness   Lower Extremity Assessment: Defer to PT evaluation    ADLs  Overall ADL's : Needs assistance/impaired Eating/Feeding: Set up, Sitting Grooming: Set up, Oral care, Sitting Upper Body Bathing: Supervision/ safety, Sitting Lower Body Bathing: Moderate assistance, +2 for physical assistance, +2 for safety/equipment, Sit to/from stand Lower Body Bathing Details (indicate cue type and reason): assist to cleanse buttocks in standing Upper Body Dressing : Minimal assistance, Sitting Lower Body Dressing: Set up, Sitting/lateral leans, Cueing for compensatory techniques Lower Body Dressing Details (indicate cue type and reason): pt. able to cross each leg over knee to don sneakers. max cues for initiation and completion of rest breaks in between each shoe. HR fluctuating from 86-88 with activity.   Toilet Transfer: Min guard, RW, Ambulation, [facility] Toilet Transfer Details (indicate cue type and reason): simulated in room with transfer from bed to chair Toileting- Clothing Manipulation and Hygiene: Moderate assistance, Sit to/from stand Toileting - Clothing Manipulation Details (indicate cue type and reason): no demonstrated during session as pt. did not actually use the b.room, only transfer Functional mobility during ADLs: Minimal assistance, Rolling walker General ADL Comments: pt. able to describe how she was feeling when needing a rest break prior to admit: (dizzy, "feel it in my chest") but unable to initiate rest  breaks without cues, and once initiated was unable to sustatin the full rest break without attempting to do other tasks.     Mobility  Overal bed mobility: Needs Assistance Bed Mobility: Supine to Sit Supine to sit: Supervision, HOB elevated Sit to supine: Supervision, HOB elevated General bed mobility comments: +rail, increased time and effort    Transfers  Overall transfer level: Needs assistance Equipment used: Rolling walker (2 wheeled) Transfers: Sit to/from Stand Sit to Stand: Min assist, From elevated surface Stand pivot transfers: Min guard General transfer comment: cues for hand placement, multi attempts to stand using momentum    Ambulation / Gait / Stairs / Psychologist, prison and probation services  Ambulation/Gait Ambulation/Gait assistance: Min guard, Editor, commissioning (Feet): 50 Feet Assistive device: Rolling walker (2 wheeled) Gait Pattern/deviations: Step-through pattern, Decreased stride length, Wide base of support General Gait Details: unsteady, mild steppage gait which increases with fatigue. Min guard assist straight, level surfaces. LOB during turn requiring min assist to maintain balance. Gait velocity: decreased Gait velocity interpretation: <1.31 ft/sec, indicative of household ambulator    Posture / Balance Dynamic Sitting Balance Sitting balance - Comments: steady sitting reaching outside BOS Balance Overall balance assessment: Needs assistance Sitting-balance support: No upper extremity supported, Feet supported Sitting balance-Leahy Scale: Fair Sitting balance - Comments: steady sitting reaching outside BOS Standing balance support: Bilateral upper extremity supported, During functional activity Standing balance-Leahy Scale: Poor Standing balance comment: reliant on RW    Special needs/care consideration BiPAP/CPAP No CPM No Continuous Drip IV No  Dialysis No       Life Vest No Oxygen No Special Bed No Trach Size No Wound Vac (area) No       Skin  Dry, saggy skin per patient from weight/fluid loss                            Bowel mgmt: Last Bm 04/04/18 Bladder mgmt: Voiding up on bedside commode Diabetic mgmt Yes, on insulin at home    Previous Home Environment Living Arrangements: Alone Available Help at Discharge: Friend(s), Family, Available PRN/intermittently Type of Home: House Home Layout: One level Home Access: Stairs to enter Entrance Stairs-Rails: Right Entrance Stairs-Number of Steps: 4(pt thinks someone is building a ramp) Bathroom Shower/Tub: Health visitor: Standard Home Care Services: No  Discharge Living Setting Plans for Discharge Living Setting: Lives with (comment), Other (Comment)(Lives with BF in a modular home.) Type of Home at Discharge: Other (Comment)(Modular home) Discharge Home Layout: One level Discharge Home Access: Stairs to enter Entrance Stairs-Rails: Right Entrance Stairs-Number of Steps: 3 Discharge Bathroom Shower/Tub: Walk-in shower, Door Discharge Bathroom Toilet: Handicapped height Discharge Bathroom Accessibility: Yes How Accessible: Accessible via walker Does the patient have any problems obtaining your medications?: No  Social/Family/Support Systems Patient Roles: Other (Comment)(Has BF, 2 daughters.) Contact Information: Gabrielle Dare - Boyfriend Anticipated Caregiver: BF Anticipated Caregiver's Contact Information: Bebe Shaggy - [phone] Ability/Limitations of Caregiver: Renae Fickle works as a Scientist, water quality.  Daughters work.  Has a son in law and a best friend who can assist. Caregiver Availability: Other (Comment)(Patient aware of potential need for 24/7 help at discharge) Discharge Plan Discussed with Primary Caregiver: Yes Is Caregiver In Agreement with Plan?: Yes Does Caregiver/Family have Issues with Lodging/Transportation while Pt is in Rehab?: No  Goals/Additional Needs Patient/Family Goal for Rehab: PT/OT supervision goals Expected length of stay: 10-14 days  Cultural Considerations: Apostolic faith Dietary Needs: Heart healthy, carb mod, thin liquids Equipment Needs: TBD Pt/Family Agrees to Admission and willing to participate: Yes Program Orientation Provided & Reviewed with Pt/Caregiver Including Roles  & Responsibilities: Yes  Decrease burden of Care through IP rehab admission: N/A  Possible need for SNF placement upon discharge: Not planned  Patient Condition: This patient's condition remains as documented in the consult dated 04/04/18, in which the Rehabilitation Physician determined and documented that the patient's condition is appropriate for intensive rehabilitative care in an inpatient rehabilitation facility. Note that patient is now agreeable to inpatient rehab admission.  Will admit to inpatient rehab today.  Preadmission Screen Completed By:  Trish Mage, 04/05/2018 11:58 AM ______________________________________________________________________   Discussed status with Dr. Riley Kill on 04/05/18 at 1158 and received telephone approval for admission today.  Admission Coordinator:  Trish Mage, time 1159/Date 04/05/18           Cosigned by: Ranelle Oyster, MD at 04/05/2018 12:14 PM  Revision History

## 2018-04-05 NOTE — Progress Notes (Signed)
Pt arrived to unit with all belongings. Pt alert and oriented. Pt has no complaints of pain. Continue plan of care.

## 2018-04-05 NOTE — Progress Notes (Signed)
IP rehab admissions - A bed has become available on inpatient rehab today and will admit to CIR today.  Call me for questions.  [phone]

## 2018-04-05 NOTE — Plan of Care (Signed)
  Problem: Education: Goal: Ability to demonstrate management of disease process will improve Outcome: Progressing Goal: Ability to verbalize understanding of medication therapies will improve Outcome: Progressing Goal: Individualized Educational Video(s) Outcome: Progressing   Problem: Activity: Goal: Capacity to carry out activities will improve Outcome: Progressing   Problem: Health Behavior/Discharge Planning: Goal: Ability to manage health-related needs will improve Outcome: Progressing   Problem: Activity: Goal: Risk for activity intolerance will decrease Outcome: Progressing   Problem: Cardiac: Goal: Ability to achieve and maintain adequate cardiopulmonary perfusion will improve Outcome: Adequate for Discharge   Problem: Clinical Measurements: Goal: Ability to maintain clinical measurements within normal limits will improve Outcome: Adequate for Discharge Goal: Will remain free from infection Outcome: Adequate for Discharge Goal: Diagnostic test results will improve Outcome: Adequate for Discharge Goal: Respiratory complications will improve Outcome: Adequate for Discharge Goal: Cardiovascular complication will be avoided Outcome: Adequate for Discharge   Problem: Nutrition: Goal: Adequate nutrition will be maintained Outcome: Adequate for Discharge   Problem: Elimination: Goal: Will not experience complications related to bowel motility Outcome: Adequate for Discharge Goal: Will not experience complications related to urinary retention Outcome: Adequate for Discharge   Problem: Pain Managment: Goal: General experience of comfort will improve Outcome: Adequate for Discharge   Problem: Skin Integrity: Goal: Risk for impaired skin integrity will decrease Outcome: Adequate for Discharge

## 2018-04-05 NOTE — Progress Notes (Signed)
Jamse Arn, MD  Physician  Physical Medicine and Rehabilitation  Consult Note  Signed  Date of Service:  04/04/2018 8:19 AM       Related encounter: Admission (Current) from 03/24/2018 in [place] [facility]      Signed      Expand All Collapse All    Show:Clear all [x] Manual[x] Template[] Copied  Added by: [x] Love, Ivan Anchors, PA-C[x] Jamse Arn, MD  [] Hover for details      Physical Medicine and Rehabilitation Consult   Reason for Consult: Debility Referring Physician: Dr. Aundra Dubin   HPI: Katherine [name] is a 58 y.o. female with with history of chronic systolic HF secondary to an NICM s/p ICD, CVA with residual left-sided weakness, V- tach/V- Fib-recently started on amiodarone, T2DM, CKD who was admitted to [facility] 03/19/2018 with nausea vomiting, worsening of dyspnea as well as orthopnea.  History taken from chart review and patient.  GI work-up revealed mildly elevated LFTs to cholestasis but no evidence of diabetes obstruction.  GI symptoms felt to be due to amiodarone and dose was decreased.  Patient with markedly volume overload but unable to tolerate due to acute chronic renal failure with low blood pressures.  She was transferred to [facility] on 01/23/2018 regimen. She was started on dobutamine and heart failure team following for aggressive diuresis. Weights trending down and dobutamine discontinued yesterday with up titration of medications and SCr back to baseline?  She developed shoulder pain and treated for gout flare. She had issues with hemorrhoidal flare due to constipation --bowel program adjusted and reports that nausea has resolved with disimpaction.  Amiodarone decreased to 200 mg bid with resolution of tremors.   She is noted to be deconditioned and is refusing SNF. MD recommending CIR due to functional decline. She reports that fluid overload and respiratory status have greatly improved and she is able to tolerate sitting up in chair 3-5  hours daily. Lives with boyfriend who manages home and has been providing assistance for the past 6+months due to cardiac issues. She reports boyfriend can provide assistance as needed past discharge and would prefer to go to outpatient PT in [place] post discharge.     Review of Systems  Constitutional: Negative for chills and fever.  HENT: Negative for hearing loss and tinnitus.   Eyes: Negative for blurred vision and double vision.  Respiratory: Negative for cough and hemoptysis.   Cardiovascular: Negative for chest pain and palpitations.  Gastrointestinal: Positive for nausea. Negative for abdominal pain and constipation.  Musculoskeletal: Positive for falls (due to syncope/VT?) and myalgias (with weather changes).  Skin: Positive for rash. Negative for itching.  Neurological: Positive for focal weakness (chronic left sided weakness due to multiple strokes) and weakness. Negative for dizziness.  Psychiatric/Behavioral: The patient is not nervous/anxious and does not have insomnia.   All other systems reviewed and are negative.        Past Medical History:  Diagnosis Date  . Anxiety   . Arthritis   . Asthma   . Bronchitis   . CHF (congestive heart failure) ([place])    a. EF 20-25% by cath in 11/2010 with cath showing normal cors  . CKD (chronic kidney disease) stage 3, GFR 30-59 ml/min (HCC)   . Fibromyalgia   . GERD (gastroesophageal reflux disease)   . Hallux valgus of left foot   . Hay fever   . Headache(784.0)   . History of pneumonia   . History of seizures   . Hyperlipidemia   .  Hypertension   . Nonischemic cardiomyopathy ([place])   . Obesity   . Stroke ([place])   . Systemic lupus erythematosus ([place])   . Type 2 diabetes mellitus ([place])          Past Surgical History:  Procedure Laterality Date  . ICD  02/13/2011   Medtronic Protecta DR XT  . ICD GENERATOR CHANGEOUT N/A 01/07/2017   Procedure: [place];  Surgeon:  Evans Lance, MD;  Location: [place] [facility] LAB;  Service: Cardiovascular;  Laterality: N/A;  . IMPLANTABLE CARDIOVERTER DEFIBRILLATOR IMPLANT N/A 02/13/2011   Procedure: IMPLANTABLE CARDIOVERTER DEFIBRILLATOR IMPLANT;  Surgeon: Sanda Klein, MD;  Location: [place] CATH LAB;  Service: Cardiovascular;  Laterality: N/A;  . LEFT HEART CATHETERIZATION WITH CORONARY ANGIOGRAM N/A 12/05/2010   Procedure: LEFT HEART CATHETERIZATION WITH CORONARY ANGIOGRAM;  Surgeon: Pixie Casino, MD;  Location: [facility] CATH LAB;  Service: Cardiovascular;  Laterality: N/A;  . RIGHT ANKLE    . TUBAL LIGATION    . US ECHOCARDIOGRAPHY  11/09/2010   mild LV enlargement w/conc. LVH & severe global hypokinesis EF 30-35%,mod. diastolic dysfunction, mod. TR,mild AI,mild to mod MR,mild PI         Family History  Problem Relation Age of Onset  . Heart failure Mother   . Diabetes Father   . Hypertension Sister   . Hypertension Brother   . Hypertension Daughter     Social History: Lives with boyfriend (who does not work). Disabled due to multiple medical issues. She was independent but has been limited by DOE since August 2019.  She  reports that she quit smoking about 12 years ago. Her smoking use included cigarettes. She started smoking about 38 years ago. She has a 26.00 pack-year smoking history. She has never used smokeless tobacco. She reports previous alcohol use. She reports that she does not use drugs.        Allergies  Allergen Reactions  . Aspirin Other (See Comments)  . Penicillins Itching    Can take amoxicillin without a reaction  . Shellfish Allergy Other (See Comments)    Causes to have grand mal seizures          Medications Prior to Admission  Medication Sig Dispense Refill  . albuterol (PROVENTIL HFA;VENTOLIN HFA) 108 (90 BASE) MCG/ACT inhaler Inhale 2 puffs into the lungs 4 (four) times daily as needed. For wheezing and shortness of breath    . alum & mag  hydroxide-simeth (MAALOX/MYLANTA) 200-200-20 MG/5ML suspension Take 30 mLs by mouth every 6 (six) hours as needed for indigestion or heartburn. 355 mL 0  . amiodarone (PACERONE) 200 MG tablet Take 1 tablet (200 mg total) by mouth every 6 (six) hours.    . cetirizine (ZYRTEC) 10 MG tablet Take 10 mg by mouth daily.     . chlorhexidine (PERIDEX) 0.12 % solution 15 mLs by Mouth Rinse route 2 (two) times daily. 120 mL 0  . clopidogrel (PLAVIX) 75 MG tablet Take 1 tablet (75 mg total) by mouth daily. 90 tablet 3  . COD LIVER OIL PO Take 1 tablet by mouth daily.     Mar[place] Kitchen dimenhyDRINATE (DRAMAMINE) 50 MG tablet Take 0.5 tablets (25 mg total) by mouth every 8 (eight) hours as needed for dizziness. 30 tablet 0  . diphenhydrAMINE (BENADRYL) 25 mg capsule Take 1 capsule (25 mg total) by mouth every 6 (six) hours as needed for itching. 30 capsule 0  . furosemide (LASIX) 80 MG tablet Take 1 tablet (80 mg total) by  mouth daily. 30 tablet 11  . guaiFENesin (ROBITUSSIN) 100 MG/5ML SOLN Take 5 mLs (100 mg total) by mouth every 4 (four) hours as needed for cough or to loosen phlegm. 236 mL 0  . HUMALOG MIX 50/50 KWIKPEN (50-50) 100 UNIT/ML Kwikpen INJECT 30 UNITS INTO THE SKIN TWICE DAILY. 15 mL 2  . hydrALAZINE (APRESOLINE) 10 MG tablet Take 2 tablets (20 mg total) by mouth every 8 (eight) hours. 180 tablet 3  . hydrocortisone cream 0.5 % Apply topically 3 (three) times daily as needed for itching. 30 g 0  . hydroxychloroquine (PLAQUENIL) 200 MG tablet Take 200 mg by mouth 2 (two) times daily.    . insulin aspart (NOVOLOG) 100 UNIT/ML injection Inject 0-9 Units into the skin 3 (three) times daily with meals. 10 mL 11  . insulin aspart (NOVOLOG) 100 UNIT/ML injection Inject 0-5 Units into the skin at bedtime. 10 mL 11  . isosorbide mononitrate (IMDUR) 30 MG 24 hr tablet Take 1 tablet (30 mg total) by mouth daily. 30 tablet 3  . loratadine (CLARITIN) 10 MG tablet Take 1 tablet (10 mg total) by mouth daily as  needed for allergies or rhinitis.    . Magnesium 400 MG TABS Take 400 mg by mouth daily for 30 days. 30 tablet 0  . metoprolol succinate (TOPROL-XL) 50 MG 24 hr tablet Take 1 tablet (50 mg total) by mouth daily. Take with or immediately following a meal. 30 tablet 3  . mometasone-formoterol (DULERA) 200-5 MCG/ACT AERO Inhale 2 puffs into the lungs 2 (two) times daily. 8.8 g 3  . Multiple Vitamins-Minerals (MULTIVITAMIN WOMEN 50+ PO) Take 1 tablet by mouth daily.    . ondansetron (ZOFRAN) 4 MG tablet Take 1 tablet (4 mg total) by mouth every 8 (eight) hours as needed for nausea or vomiting. 20 tablet 0  . ondansetron (ZOFRAN) 4 MG/2ML SOLN injection Inject 2 mLs (4 mg total) into the vein every 6 (six) hours as needed for nausea. 2 mL 0  . polyethylene glycol (MIRALAX / GLYCOLAX) packet Take 17 g by mouth daily as needed for moderate constipation. 14 each 0  . potassium chloride SA (K-DUR,KLOR-CON) 20 MEQ tablet Take 2 tablets (40 mEq total) by mouth daily. 60 tablet 3  . promethazine (PHENERGAN) 25 MG/ML injection Inject 1 mL (25 mg total) into the vein every 6 (six) hours as needed for nausea or vomiting. 1 mL 0  . ranolazine (RANEXA) 500 MG 12 hr tablet Take 1 tablet (500 mg total) by mouth 2 (two) times daily.    . simvastatin (ZOCOR) 20 MG tablet TAKE ONE TABLET BY MOUTH AT BEDTIME. 30 tablet 0  . sodium chloride (OCEAN) 0.65 % SOLN nasal spray Place 1 spray into both nostrils as needed for congestion.  0  . venlafaxine XR (EFFEXOR-XR) 150 MG 24 hr capsule Take 150 mg by mouth daily.       Home: Home Living Family/patient expects to be discharged to:: Private residence Living Arrangements: Alone Available Help at Discharge: Friend(s), Family, Available PRN/intermittently Type of Home: House Home Access: Stairs to enter [employer] of Steps: 4(pt thinks someone is building a ramp) Entrance Stairs-Rails: Right Home Layout: One level Bathroom Shower/Tub: Clinical cytogeneticist: Standard Home Equipment: Geneticist, molecular, Wheelchair - manual, Environmental consultant - 4 wheels, Environmental consultant - 2 wheels, Grab bars - tub/shower  Functional History: Prior Function Level of Independence: Needs assistance Gait / Transfers Assistance Needed: Pt reports ambulatory with RW at home; sometimes needs help  from family to get out of bed and to/from wheelchair. Reports increased difficulty indep pushing manual w/c, wants an electric scooter ADL's / Homemaking Assistance Needed: PRN assist from family for ADLs; reports she had supervision for shower transfers from a friend Comments: Reports 2 falls in past 6 months Functional Status:  Mobility: Bed Mobility Overal bed mobility: Needs Assistance Bed Mobility: Supine to Sit Supine to sit: Supervision Sit to supine: Supervision, HOB elevated General bed mobility comments: seated in recliner at beginning and end of session Transfers Overall transfer level: Needs assistance Equipment used: Rolling walker (2 wheeled) Transfers: Sit to/from Stand, [employer] Transfers Sit to Stand: Min assist Stand pivot transfers: Min guard General transfer comment: Hands on assist for safety, cues for hand placement and sequencing  Ambulation/Gait Ambulation/Gait assistance: Min guard Gait Distance (Feet): 20 Feet Assistive device: Rolling walker (2 wheeled) Gait Pattern/deviations: Decreased stride length, Wide base of support, Trunk flexed General Gait Details: Pt with unsteady gait requiring assist for safety. Gait velocity: decreased Gait velocity interpretation: <1.31 ft/sec, indicative of household ambulator  ADL: ADL Overall ADL's : Needs assistance/impaired Eating/Feeding: Set up, Sitting Grooming: Set up, Oral care, Sitting Upper Body Bathing: Supervision/ safety, Sitting Lower Body Bathing: Moderate assistance, +2 for physical assistance, +2 for safety/equipment, Sit to/from stand Lower Body Bathing Details (indicate cue type and  reason): assist to cleanse buttocks in standing Upper Body Dressing : Minimal assistance, Sitting Lower Body Dressing: Set up, Sitting/lateral leans, Cueing for compensatory techniques Lower Body Dressing Details (indicate cue type and reason): pt. able to cross each leg over knee to don sneakers. max cues for initiation and completion of rest breaks in between each shoe. HR fluctuating from 86-88 with activity.   Toilet Transfer: Min guard, RW, Ambulation, [facility] Toilet Transfer Details (indicate cue type and reason): simulated in room with transfer from bed to chair Toileting- Clothing Manipulation and Hygiene: Moderate assistance, Sit to/from stand Toileting - Clothing Manipulation Details (indicate cue type and reason): no demonstrated during session as pt. did not actually use the b.room, only transfer Functional mobility during ADLs: Minimal assistance, Rolling walker General ADL Comments: pt. able to describe how she was feeling when needing a rest break prior to admit: (dizzy, "feel it in my chest") but unable to initiate rest breaks without cues, and once initiated was unable to sustatin the full rest break without attempting to do other tasks.   Cognition: Cognition Overall Cognitive Status: No family/caregiver present to determine baseline cognitive functioning Orientation Level: Oriented X4 Cognition Arousal/Alertness: Awake/alert Behavior During Therapy: WFL for tasks assessed/performed Overall Cognitive Status: No family/caregiver present to determine baseline cognitive functioning Area of Impairment: Attention, Memory, Following commands, Safety/judgement, Problem solving Current Attention Level: Sustained Memory: Decreased short-term memory Following Commands: Follows one step commands consistently Safety/Judgement: Decreased awareness of safety, Decreased awareness of deficits Problem Solving: Slow processing, Requires verbal cues General Comments: Fluctuating attention during  tasks, Pt pleasant when re-assured and encouraged.    Blood pressure 122/61, pulse 90, temperature 98.1 F (36.7 C), temperature source Oral, resp. rate (!) 21, height 6' (1.829 m), weight 102.6 kg, last menstrual period 01/30/2011, SpO2 98 %. Physical Exam  Nursing note and vitals reviewed. Constitutional: She is oriented to person, place, and time. She appears well-developed.  Obese  HENT:  Right Ear: External ear normal.  Left Ear: External ear normal.  Right facial edema (due to trauma post fall?)  Eyes: EOM are normal. Right eye exhibits no discharge. Left eye exhibits no  discharge.  Neck: Normal range of motion. Neck supple.  Cardiovascular: Normal rate and regular rhythm.  Respiratory: Effort normal and breath sounds normal.  GI: Soft. Bowel sounds are normal.  Musculoskeletal:     Comments: No edema or tenderness in extremities  Neurological: She is alert and oriented to person, place, and time.  Motor: Bilateral upper extremities: 4-/5 proximal distal Bilateral lower extremities: Hip flexion 3+/5, knee extension 4-/5, ankle dorsiflexion 4-/5 (left stronger than right)  Skin:  Bilateral knees with mild erythema and healing lesions. BLE with loose unna boots.   Psychiatric: She has a normal mood and affect. Her behavior is normal.    LabResultsLast24Hours       Results for orders placed or performed during the hospital encounter of 03/24/18 (from the past 24 hour(s))  Glucose, capillary     Status: Abnormal   Collection Time: 04/03/18 11:17 AM  Result Value Ref Range   Glucose-Capillary 138 (H) 70 - 99 mg/dL   Comment 1 Notify RN    Comment 2 Document in Chart   Glucose, capillary     Status: Abnormal   Collection Time: 04/03/18  4:35 PM  Result Value Ref Range   Glucose-Capillary 109 (H) 70 - 99 mg/dL   Comment 1 Notify RN    Comment 2 Document in Chart   Glucose, capillary     Status: None   Collection Time: 04/03/18  9:37 PM  Result Value  Ref Range   Glucose-Capillary 97 70 - 99 mg/dL  .Cooxemetry Panel (carboxy, met, total hgb, O2 sat)     Status: Abnormal   Collection Time: 04/04/18  5:30 AM  Result Value Ref Range   Total hemoglobin 11.2 (L) 12.0 - 16.0 g/dL   O2 Saturation 71.2 %   Carboxyhemoglobin 1.9 (H) 0.5 - 1.5 %   Methemoglobin 1.0 0.0 - 1.5 %  Basic metabolic panel     Status: Abnormal   Collection Time: 04/04/18  5:56 AM  Result Value Ref Range   Sodium 137 135 - 145 mmol/L   Potassium 3.2 (L) 3.5 - 5.1 mmol/L   Chloride 99 98 - 111 mmol/L   CO2 26 22 - 32 mmol/L   Glucose, Bld 98 70 - 99 mg/dL   BUN 22 (H) 6 - 20 mg/dL   Creatinine, Ser 1.98 (H) 0.44 - 1.00 mg/dL   Calcium 8.4 (L) 8.9 - 10.3 mg/dL   GFR calc non Af Amer 27 (L) >60 mL/min   GFR calc Af Amer 32 (L) >60 mL/min   Anion gap 12 5 - 15  CBC with Differential/Platelet     Status: Abnormal   Collection Time: 04/04/18  5:56 AM  Result Value Ref Range   WBC 9.7 4.0 - 10.5 K/uL   RBC 4.02 3.87 - 5.11 MIL/uL   Hemoglobin 12.4 12.0 - 15.0 g/dL   HCT 36.8 36.0 - 46.0 %   MCV 91.5 80.0 - 100.0 fL   MCH 30.8 26.0 - 34.0 pg   MCHC 33.7 30.0 - 36.0 g/dL   RDW 18.6 (H) 11.5 - 15.5 %   Platelets 430 (H) 150 - 400 K/uL   nRBC 0.0 0.0 - 0.2 %   Neutrophils Relative % 55 %   Neutro Abs 5.4 1.7 - 7.7 K/uL   Lymphocytes Relative 29 %   Lymphs Abs 2.8 0.7 - 4.0 K/uL   Monocytes Relative 10 %   Monocytes Absolute 1.0 0.1 - 1.0 K/uL   Eosinophils Relative 3 %  Eosinophils Absolute 0.3 0.0 - 0.5 K/uL   Basophils Relative 1 %   Basophils Absolute 0.1 0.0 - 0.1 K/uL   Immature Granulocytes 2 %   Abs Immature Granulocytes 0.15 (H) 0.00 - 0.07 K/uL  Glucose, capillary     Status: None   Collection Time: 04/04/18  7:29 AM  Result Value Ref Range   Glucose-Capillary 99 70 - 99 mg/dL     ImagingResults(Last48hours)  No results found.    Assessment/Plan: Diagnosis: Debility Labs independently  reviewed.  Records reviewed and summated above.  1. Does the need for close, 24 hr/day medical supervision in concert with the patient's rehab needs make it unreasonable for this patient to be served in a less intensive setting? No  2. Co-Morbidities requiring supervision/potential complications: chronic systolic CHF (Monitor in accordance with increased physical activity and avoid UE resistance excercises), history of CVA with residual left-sided weakness, V- tach/V- Fib-recently started on amiodarone, T2 DM (Monitor in accordance with exercise and adjust meds as necessary), CKD (avoid nephrotoxic meds), gout (continue meds), hemorrhoids (continue meds), hypokalemia (continue to monitor and replete as necessary) 3. Due to bladder management, safety, skin/wound care, disease management and patient education, does the patient require 24 hr/day rehab nursing? Potentially 4. Does the patient require coordinated care of a physician, rehab nurse, PT (1-2 hrs/day, 5 days/week) and OT (1-2 hrs/day, 5 days/week) to address physical and functional deficits in the context of the above medical diagnosis(es)? Potentially Addressing deficits in the following areas: balance, endurance, locomotion, strength, transferring, bathing, dressing, toileting and psychosocial support 5. Can the patient actively participate in an intensive therapy program of at least 3 hrs of therapy per day at least 5 days per week? Potentially 6. The potential for patient to make measurable gains while on inpatient rehab is good 7. Anticipated functional outcomes upon discharge from inpatient rehab are supervision  with PT, supervision with OT, n/a with SLP. 8. Estimated rehab length of stay to reach the above functional goals is: 10-14 days. 9. Anticipated D/C setting: Home 10. Anticipated post D/C treatments: HH therapy and Home excercise program 11. Overall Rehab/Functional Prognosis: good  RECOMMENDATIONS: This patient's condition is  appropriate for continued rehabilitative care in the following setting: Patient predominantly limited by endurance.  She is refusing CIR and would like to go home with [facility] and support of her husband. Given progress, believe this is acceptable. Recommend home with [facility] if patient continues to refuse SNF. Patient has agreed to participate in recommended program. Potentially Note that insurance prior authorization may be required for reimbursement for recommended care.  Comment: Rehab Admissions Coordinator to follow up.   I have personally performed a face to face diagnostic evaluation, including, but not limited to relevant history and physical exam findings, of this patient and developed relevant assessment and plan.  Additionally, I have reviewed and concur with the physician assistant's documentation above.   Delice Lesch, MD, ABPMR Bary Leriche, PA-C 04/04/2018        Revision History                             Routing History

## 2018-04-05 NOTE — Progress Notes (Addendum)
Patient ID: Katherine [name], female   DOB: March 28, 1960, 58 y.o.   MRN: [id number]     Advanced Heart Failure Rounding Note  PCP-Cardiologist: Lewayne Bunting, MD   Subjective:    Coox 48% off dobutamine. Repeat ordered. Creatinine worse today 2.06 -> 1.98 -> 2.19. Weight down 4 more labs on PO torsemide. CVP 4-5 today.  Pending DC to CIR when bed available.   Feels fine. UOP has tapered off. No SOB.   Objective:   Weight Range: 101.1 kg Body mass index is 30.23 kg/m.   Vital Signs:   Temp:  [98.1 F (36.7 C)-98.5 F (36.9 C)] 98.5 F (36.9 C) (03/17 0732) Pulse Rate:  [82-88] 82 (03/17 0732) Resp:  [15-19] 17 (03/17 0732) BP: (110-126)/(62-85) 118/70 (03/17 0732) SpO2:  [95 %-100 %] 100 % (03/17 0732) Weight:  [101.1 kg] 101.1 kg (03/17 0405) Last BM Date: 04/04/18  Weight change: Filed Weights   04/03/18 0300 04/04/18 0300 04/05/18 0405  Weight: 105.7 kg 102.6 kg 101.1 kg   Intake/Output:   Intake/Output Summary (Last 24 hours) at 04/05/2018 0818 Last data filed at 04/05/2018 0600 Gross per 24 hour  Intake 687 ml  Output 1202 ml  Net -515 ml    Physical Exam    General: Obese. No resp difficulty. HEENT: Normal Neck: Supple. JVP 5-6. Carotids 2+ bilat; no bruits. No thyromegaly or nodule noted. Cor: PMI lateral displaced. RRR, No M/G/R noted Lungs: CTAB, normal effort. Abdomen: Soft, non-tender, non-distended, no HSM. No bruits or masses. +BS  Extremities: No cyanosis, clubbing, or rash. R and LLE no edema. BLE UNNA boots.  Neuro: Alert & orientedx3, cranial nerves grossly intact. moves all 4 extremities w/o difficulty. Affect pleasant  Telemetry   NSR 80s, personally reviewed.   Labs    CBC Recent Labs    04/04/18 0556 04/05/18 0427  WBC 9.7 7.9  NEUTROABS 5.4 3.6  HGB 12.4 13.3  HCT 36.8 40.3  MCV 91.5 92.4  PLT 430* 483*   Basic Metabolic Panel Recent Labs    03/47/42 0556 04/05/18 0427  NA 137 136  K 3.2* 4.1  CL 99 96*  CO2 26 28   GLUCOSE 98 121*  BUN 22* 22*  CREATININE 1.98* 2.19*  CALCIUM 8.4* 9.5   Liver Function Tests No results for input(s): AST, ALT, ALKPHOS, BILITOT, PROT, ALBUMIN in the last 72 hours. No results for input(s): LIPASE, AMYLASE in the last 72 hours. Cardiac Enzymes No results for input(s): CKTOTAL, CKMB, CKMBINDEX, TROPONINI in the last 72 hours.  BNP: BNP (last 3 results) Recent Labs    01/12/18 0410 03/19/18 0702 03/24/18 1752  BNP 2,922.0* >4,500.0* 893.2*    ProBNP (last 3 results) No results for input(s): PROBNP in the last 8760 hours.   D-Dimer No results for input(s): DDIMER in the last 72 hours. Hemoglobin A1C No results for input(s): HGBA1C in the last 72 hours. Fasting Lipid Panel No results for input(s): CHOL, HDL, LDLCALC, TRIG, CHOLHDL, LDLDIRECT in the last 72 hours. Thyroid Function Tests No results for input(s): TSH, T4TOTAL, T3FREE, THYROIDAB in the last 72 hours.  Invalid input(s): FREET3  Other results:   Imaging    No results found.   Medications:     Scheduled Medications: . amiodarone  200 mg Oral BID  . clopidogrel  75 mg Oral Daily  . colchicine  0.6 mg Oral Daily  . digoxin  0.0625 mg Oral Daily  . docusate sodium  100 mg Oral BID  .  feeding supplement (GLUCERNA SHAKE)  237 mL Oral TID BM  . heparin  5,000 Units Subcutaneous Q8H  . hydroxychloroquine  200 mg Oral BID  . insulin aspart  0-5 Units Subcutaneous QHS  . insulin aspart  0-9 Units Subcutaneous TID WC  . isosorbide-hydrALAZINE  1 tablet Oral TID  . loratadine  10 mg Oral Daily  . mometasone-formoterol  2 puff Inhalation BID  . multivitamin with minerals  1 tablet Oral Daily  . potassium chloride  40 mEq Oral BID  . ranolazine  500 mg Oral BID  . sodium chloride flush  10-40 mL Intracatheter Q12H  . spironolactone  25 mg Oral Daily  . torsemide  80 mg Oral BID  . venlafaxine XR  150 mg Oral Q breakfast    Infusions: . sodium chloride    . sodium chloride       PRN Medications: sodium chloride, Place/Maintain arterial line **AND** sodium chloride, acetaminophen, bisacodyl, diphenhydrAMINE, oxyCODONE-acetaminophen, prochlorperazine, sodium chloride flush     Assessment/Plan   1. Acute on chronic systolic CHF: Nonischemic cardiomyopathy.  Echo in 12/19 with EF 20-25%, mildly decreased RV systolic function.  [employer] ICD.  She had been at [facility] with CHF exacerbation and VT, creatinine rose considerably with attempts at diuresis.  Lactate was elevated. Initial co-ox 38%.  SBP was low at times in the 80s-90s.  Dobutamine was started at 4 mcg/kg/min and she was diuresed with IV Lasix. Weight is down considerably and she is now on po diuretic and off dobutamine. Volume dry on exam. Coox 48%. Recheck. CVP 4-5. Creatinine trending back up.  - Hold torsemide at least this morning. Plan to resume torsemide 80 mg daily probably tomorrow. She was on lasix 80 mg daily PTA - Continue spironolactone 25 mg daily  - Continue low dose digoxin. Dig level <0.2 today.  - Continue Bidil 1 tab tid. SBP 110-120s - Probably not a good LVAD candidate with poor mobility and renal dysfunction. However, could consider if needed if she makes progress PT.  2. AKI: Creatinine up to peak of 3.17 from 1.11.  Suspect cardiorenal in setting of low cardiac output. She was admitted markedly volume overloaded. Creatinine trending back up 1.98 -> 2.19. Follow closely off dobutamine. Hold torsemide at least this morning.  3. VT: Patient had 17 episodes of VT with ICD discharges since 1/20. Suspect that CHF was trigger of VT. Has been quiescent in hospital. No change.  - Per with Dr. Graciela Husbands, decreased amiodarone to 200 mg bid.  Has a tremor that may be amiodarone-related.   - Continue ranolazine.  - No BB with low output.  4. H/o CVA: On Plavix. No change.  5. H/o SLE: On hydroxychloroquine. No change.  6. UTI: Completed cipro. No change.  7. Gout: Suspect flare in shoulders.  - Continue  colchicine - Finished 3 days prednisone. Resolved.  8. Disposition: Pending CIR when bed available.   Length of Stay: 12  Alford Highland, NP  04/05/2018, 8:18 AM  Advanced Heart Failure Team Pager [phone] (M-F; 7a - 4p)  Please contact [facility] for night-coverage after hours (4p -7a ) and weekends on [email or web address]  Patient seen with NP, agree with the above note.    Creatinine mildly higher today at 2.19, CVP 4-5.  Repeat co-ox 56%.    I will hold torsemide today, restart torsemide 80 mg daily tomorrow.    She will be going to inpatient rehab.  Cardiac meds for discharge: torsemide 80 mg daily, spironolactone  25 mg daily, amiodarone 200 mg bid, ranolazine 500 mg bid, Plavix 75 daily, digoxin 0.0625 daily, Bidil 1 tab tid, KCl 40 daily.  Follow BMET.   Marca Ancona 04/05/2018 1:49 PM

## 2018-04-05 NOTE — Progress Notes (Signed)
Physical Therapy Treatment Patient Details Name: Katherine [name] MRN: [id number] DOB: [birth year] Today's Date: 04/05/2018    History of Present Illness Pt is a 58 y.o. female admitted to [facility] on 03/19/18 with SOB and weakness; worked up for CHF and hypokalemia. Transfer to [facility] 3/5 for further medical management. PMH includes CVA with residual L sided weakness, CHF, CKD, fibromyalgia, h/o seizures, htn, DM, ICD with generator replacement, lupus.    PT Comments    Patient is progressing very well towards their physical therapy goals. Increased ambulation distance to 250 feet using walker. Requiring up to min assist for functional mobility. Presents as high fall risk based on balance deficits and decreased gait speed. D/c to CIR today.   Follow Up Recommendations  CIR     Equipment Recommendations  None recommended by PT    Recommendations for Other Services       Precautions / Restrictions Precautions Precautions: Fall Restrictions Weight Bearing Restrictions: No    Mobility  Bed Mobility Overal bed mobility: Modified Independent             General bed mobility comments: HOB up  Transfers Overall transfer level: Needs assistance Equipment used: Rolling walker (2 wheeled) Transfers: Sit to/from Stand Sit to Stand: Min assist         General transfer comment: Min assist for initiation with bed at elevated height  Ambulation/Gait Ambulation/Gait assistance: Min guard Gait Distance (Feet): 250 Feet Assistive device: Rolling walker (2 wheeled) Gait Pattern/deviations: Step-through pattern;Decreased stride length;Wide base of support Gait velocity: decreased Gait velocity interpretation: <1.8 ft/sec, indicate of risk for recurrent falls General Gait Details: Min guard for level surfaces. Pt with increased left foot drag with fatigue despite cueing. Slightly improved gait speed    Stairs             Wheelchair Mobility    Modified Rankin (Stroke  Patients Only)       Balance Overall balance assessment: Needs assistance   Sitting balance-Leahy Scale: Good     Standing balance support: Bilateral upper extremity supported;During functional activity Standing balance-Leahy Scale: Poor Standing balance comment: reliant on RW                            Cognition Arousal/Alertness: Awake/alert Behavior During Therapy: WFL for tasks assessed/performed Overall Cognitive Status: Within Functional Limits for tasks assessed                                        Exercises      General Comments        Pertinent Vitals/Pain Pain Assessment: No/denies pain    Home Living                      Prior Function            PT Goals (current goals can now be found in the care plan section) Acute Rehab PT Goals Patient Stated Goal: get stronger Potential to Achieve Goals: Good Progress towards PT goals: Progressing toward goals    Frequency    Min 3X/week      PT Plan Current plan remains appropriate    Co-evaluation              AM-PAC PT "6 Clicks" Mobility   Outcome Measure  Help needed turning from your back to  your side while in a flat bed without using bedrails?: None Help needed moving from lying on your back to sitting on the side of a flat bed without using bedrails?: A Little Help needed moving to and from a bed to a chair (including a wheelchair)?: A Little Help needed standing up from a chair using your arms (e.g., wheelchair or bedside chair)?: A Little Help needed to walk in hospital room?: A Little Help needed climbing 3-5 steps with a railing? : A Lot 6 Click Score: 18    End of Session Equipment Utilized During Treatment: Gait belt Activity Tolerance: Patient tolerated treatment well Patient left: in bed;with call bell/phone within reach Nurse Communication: Mobility status PT Visit Diagnosis: Other abnormalities of gait and mobility (R26.89);Muscle  weakness (generalized) (M62.81);Difficulty in walking, not elsewhere classified (R26.2)     Time: 6269-4854 PT Time Calculation (min) (ACUTE ONLY): 14 min  Charges:  $Gait Training: 8-22 mins                     Laurina Bustle, [place], DPT Acute Rehabilitation Services Pager [phone] Office [phone]    Vanetta Mulders 04/05/2018, 5:08 PM

## 2018-04-05 NOTE — H&P (Signed)
Physical Medicine and Rehabilitation Admission H&P     CC: Debility.      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  GI work-up revealed mildly elevated LFTs due to cholestasis but no evidence of obstruction.  GI symptoms felt to be due to amiodarone and dose was decreased.  Patient was noted to be markedly volume overloaded but was unable to tolerate diuresis due to acute on chronic renal failure as well as low blood pressures.  She was transferred to [facility] on 03/24/18 for management by heart failure team.   She was placed dobutamine and has tolerated diuresis with resolution of fluid overload. Dobutamine weaned off by 3/15 and medications being adjusted with weight down from 134.7 kg--> 101.1 kg. AKI being monitored and serum creatinine noted to be trending back up therefore Demadex held today--to resume at lower dose tomorrow.  Hospital course significant for issues with left shoulder pain felt to be due to gout flare, severe constipation requiring disimpaction, hemorrhoidal flare as well as intermittent issues with nausea.  Respiratory status and activity tolerance has improved but patient noted to be deconditioned.  Therapy evaluations revealed functional decline and CIR recommended for follow-up therapy     Review of Systems  Constitutional: Negative for chills and fever.  HENT: Negative for hearing loss and tinnitus.   Eyes: Negative for blurred vision.       Left lateral visual field deficits due to stroke.   Respiratory: Negative for cough and shortness of breath.   Cardiovascular: Positive for leg swelling. Negative for chest pain and palpitations.  Gastrointestinal: Positive for nausea and vomiting. Negative for constipation and heartburn.  Genitourinary: Negative for  dysuria and urgency.  Musculoskeletal: Positive for falls (due to syncope/VT episodes) and myalgias. Negative for joint pain.  Skin: Negative for rash.  Neurological: Positive for focal weakness. Negative for dizziness, tremors and headaches.  Psychiatric/Behavioral: The patient is not nervous/anxious and does not have insomnia.           Past Medical History:  Diagnosis Date   Anxiety     Arthritis     Asthma     Bronchitis     CHF (congestive heart failure) ([place])      a. EF 20-25% by cath in 11/2010 with cath showing normal cors   CKD (chronic kidney disease) stage 3, GFR 30-59 ml/min (HCC)     Fibromyalgia     GERD (gastroesophageal reflux disease)     Hallux valgus of left foot     Hay fever     Headache(784.0)     History of pneumonia     History of seizures     Hyperlipidemia     Hypertension     Nonischemic cardiomyopathy ([place])     Obesity     Stroke ([place])     Systemic lupus erythematosus ([place])     Type 2 diabetes mellitus ([place])             Past Surgical History:  Procedure Laterality Date   ICD   02/13/2011    Medtronic Protecta DR XT   ICD GENERATOR CHANGEOUT N/A 01/07/2017    Procedure: ICD GENERATOR CHANGEOUT;  Surgeon: Evans Lance, MD;  Location: [place] CV LAB;  Service:  Cardiovascular;  Laterality: N/A;   IMPLANTABLE CARDIOVERTER DEFIBRILLATOR IMPLANT N/A 02/13/2011    Procedure: IMPLANTABLE CARDIOVERTER DEFIBRILLATOR IMPLANT;  Surgeon: Sanda Klein, MD;  Location: [place] CATH LAB;  Service: Cardiovascular;  Laterality: N/A;   LEFT HEART CATHETERIZATION WITH CORONARY ANGIOGRAM N/A 12/05/2010    Procedure: LEFT HEART CATHETERIZATION WITH CORONARY ANGIOGRAM;  Surgeon: Pixie Casino, MD;  Location: [facility] CATH LAB;  Service: Cardiovascular;  Laterality: N/A;   RIGHT ANKLE       TUBAL LIGATION       US ECHOCARDIOGRAPHY   11/09/2010    mild LV enlargement w/conc. LVH & severe global hypokinesis EF 30-35%,mod. diastolic  dysfunction, mod. TR,mild AI,mild to mod MR,mild PI           Family History  Problem Relation Age of Onset   Heart failure Mother     Diabetes Father     Hypertension Sister     Hypertension Brother     Hypertension Daughter        Social History:  Lives with boyfriend (who does not work). Disabled due to multiple medical issues. She was independent but has been limited by DOE since August 2019.  She  reports that she quit smoking about 12 years ago. Her smoking use included cigarettes. She started smoking about 38 years ago. She has a 26.00 pack-year smoking history. She has never used smokeless tobacco. She reports previous alcohol use. She reports that she does not use drugs.          Allergies  Allergen Reactions   Aspirin Other (See Comments)   Penicillins Itching      Can take amoxicillin without a reaction   Shellfish Allergy Other (See Comments)      Causes to have grand mal seizures            Medications Prior to Admission  Medication Sig Dispense Refill   albuterol (PROVENTIL HFA;VENTOLIN HFA) 108 (90 BASE) MCG/ACT inhaler Inhale 2 puffs into the lungs 4 (four) times daily as needed. For wheezing and shortness of breath       alum & mag hydroxide-simeth (MAALOX/MYLANTA) 200-200-20 MG/5ML suspension Take 30 mLs by mouth every 6 (six) hours as needed for indigestion or heartburn. 355 mL 0   amiodarone (PACERONE) 200 MG tablet Take 1 tablet (200 mg total) by mouth every 6 (six) hours.       cetirizine (ZYRTEC) 10 MG tablet Take 10 mg by mouth daily.        chlorhexidine (PERIDEX) 0.12 % solution 15 mLs by Mouth Rinse route 2 (two) times daily. 120 mL 0   clopidogrel (PLAVIX) 75 MG tablet Take 1 tablet (75 mg total) by mouth daily. 90 tablet 3   COD LIVER OIL PO Take 1 tablet by mouth daily.        dimenhyDRINATE (DRAMAMINE) 50 MG tablet Take 0.5 tablets (25 mg total) by mouth every 8 (eight) hours as needed for dizziness. 30 tablet 0   diphenhydrAMINE  (BENADRYL) 25 mg capsule Take 1 capsule (25 mg total) by mouth every 6 (six) hours as needed for itching. 30 capsule 0   furosemide (LASIX) 80 MG tablet Take 1 tablet (80 mg total) by mouth daily. 30 tablet 11   guaiFENesin (ROBITUSSIN) 100 MG/5ML SOLN Take 5 mLs (100 mg total) by mouth every 4 (four) hours as needed for cough or to loosen phlegm. 236 mL 0   HUMALOG MIX 50/50 KWIKPEN (50-50) 100 UNIT/ML Kwikpen INJECT 30 UNITS INTO THE SKIN  TWICE DAILY. 15 mL 2   hydrALAZINE (APRESOLINE) 10 MG tablet Take 2 tablets (20 mg total) by mouth every 8 (eight) hours. 180 tablet 3   hydrocortisone cream 0.5 % Apply topically 3 (three) times daily as needed for itching. 30 g 0   hydroxychloroquine (PLAQUENIL) 200 MG tablet Take 200 mg by mouth 2 (two) times daily.       insulin aspart (NOVOLOG) 100 UNIT/ML injection Inject 0-9 Units into the skin 3 (three) times daily with meals. 10 mL 11   insulin aspart (NOVOLOG) 100 UNIT/ML injection Inject 0-5 Units into the skin at bedtime. 10 mL 11   isosorbide mononitrate (IMDUR) 30 MG 24 hr tablet Take 1 tablet (30 mg total) by mouth daily. 30 tablet 3   loratadine (CLARITIN) 10 MG tablet Take 1 tablet (10 mg total) by mouth daily as needed for allergies or rhinitis.       Magnesium 400 MG TABS Take 400 mg by mouth daily for 30 days. 30 tablet 0   metoprolol succinate (TOPROL-XL) 50 MG 24 hr tablet Take 1 tablet (50 mg total) by mouth daily. Take with or immediately following a meal. 30 tablet 3   mometasone-formoterol (DULERA) 200-5 MCG/ACT AERO Inhale 2 puffs into the lungs 2 (two) times daily. 8.8 g 3   Multiple Vitamins-Minerals (MULTIVITAMIN WOMEN 50+ PO) Take 1 tablet by mouth daily.       ondansetron (ZOFRAN) 4 MG tablet Take 1 tablet (4 mg total) by mouth every 8 (eight) hours as needed for nausea or vomiting. 20 tablet 0   ondansetron (ZOFRAN) 4 MG/2ML SOLN injection Inject 2 mLs (4 mg total) into the vein every 6 (six) hours as needed for  nausea. 2 mL 0   polyethylene glycol (MIRALAX / GLYCOLAX) packet Take 17 g by mouth daily as needed for moderate constipation. 14 each 0   potassium chloride SA (K-DUR,KLOR-CON) 20 MEQ tablet Take 2 tablets (40 mEq total) by mouth daily. 60 tablet 3   promethazine (PHENERGAN) 25 MG/ML injection Inject 1 mL (25 mg total) into the vein every 6 (six) hours as needed for nausea or vomiting. 1 mL 0   ranolazine (RANEXA) 500 MG 12 hr tablet Take 1 tablet (500 mg total) by mouth 2 (two) times daily.       simvastatin (ZOCOR) 20 MG tablet TAKE ONE TABLET BY MOUTH AT BEDTIME. 30 tablet 0   sodium chloride (OCEAN) 0.65 % SOLN nasal spray Place 1 spray into both nostrils as needed for congestion.   0   venlafaxine XR (EFFEXOR-XR) 150 MG 24 hr capsule Take 150 mg by mouth daily.           Drug Regimen Review  Drug regimen was reviewed and remains appropriate with no significant issues identified   Home: Home Living Family/patient expects to be discharged to:: Private residence Living Arrangements: Alone Available Help at Discharge: Friend(s), Family, Available PRN/intermittently Type of Home: House Home Access: Stairs to enter [employer] of Steps: 4(pt thinks someone is building a ramp) Entrance Stairs-Rails: Right Home Layout: One level Bathroom Shower/Tub: Multimedia programmer: Standard Home Equipment: Geneticist, molecular, Wheelchair - manual, Environmental consultant - 4 wheels, Environmental consultant - 2 wheels, Grab bars - tub/shower   Functional History: Prior Function Level of Independence: Needs assistance Gait / Transfers Assistance Needed: Pt reports ambulatory with RW at home; sometimes needs help from family to get out of bed and to/from wheelchair. Reports increased difficulty indep pushing manual w/c, wants an electric  scooter ADL's / Homemaking Assistance Needed: PRN assist from family for ADLs; reports she had supervision for shower transfers from a friend Comments: Reports 2 falls in past 6  months   Functional Status:  Mobility: Bed Mobility Overal bed mobility: Needs Assistance Bed Mobility: Supine to Sit Supine to sit: Supervision, HOB elevated Sit to supine: Supervision, HOB elevated General bed mobility comments: +rail, increased time and effort Transfers Overall transfer level: Needs assistance Equipment used: Rolling walker (2 wheeled) Transfers: Sit to/from Stand Sit to Stand: Min assist, From elevated surface Stand pivot transfers: Min guard General transfer comment: cues for hand placement, multi attempts to stand using momentum Ambulation/Gait Ambulation/Gait assistance: Min guard, Min assist Gait Distance (Feet): 50 Feet Assistive device: Rolling walker (2 wheeled) Gait Pattern/deviations: Step-through pattern, Decreased stride length, Wide base of support General Gait Details: unsteady, mild steppage gait which increases with fatigue. Min guard assist straight, level surfaces. LOB during turn requiring min assist to maintain balance. Gait velocity: decreased Gait velocity interpretation: <1.31 ft/sec, indicative of household ambulator   ADL: ADL Overall ADL's : Needs assistance/impaired Eating/Feeding: Set up, Sitting Grooming: Set up, Oral care, Sitting Upper Body Bathing: Supervision/ safety, Sitting Lower Body Bathing: Moderate assistance, +2 for physical assistance, +2 for safety/equipment, Sit to/from stand Lower Body Bathing Details (indicate cue type and reason): assist to cleanse buttocks in standing Upper Body Dressing : Minimal assistance, Sitting Lower Body Dressing: Set up, Sitting/lateral leans, Cueing for compensatory techniques Lower Body Dressing Details (indicate cue type and reason): pt. able to cross each leg over knee to don sneakers. max cues for initiation and completion of rest breaks in between each shoe. HR fluctuating from 86-88 with activity.   Toilet Transfer: Min guard, RW, Ambulation, [facility] Toilet Transfer Details (indicate  cue type and reason): simulated in room with transfer from bed to chair Toileting- Clothing Manipulation and Hygiene: Moderate assistance, Sit to/from stand Toileting - Clothing Manipulation Details (indicate cue type and reason): no demonstrated during session as pt. did not actually use the b.room, only transfer Functional mobility during ADLs: Minimal assistance, Rolling walker General ADL Comments: pt. able to describe how she was feeling when needing a rest break prior to admit: (dizzy, "feel it in my chest") but unable to initiate rest breaks without cues, and once initiated was unable to sustatin the full rest break without attempting to do other tasks.    Cognition: Cognition Overall Cognitive Status: Within Functional Limits for tasks assessed Orientation Level: Oriented X4 Cognition Arousal/Alertness: Awake/alert Behavior During Therapy: WFL for tasks assessed/performed Overall Cognitive Status: Within Functional Limits for tasks assessed Area of Impairment: Attention, Memory, Following commands, Safety/judgement, Problem solving Current Attention Level: Sustained Memory: Decreased short-term memory Following Commands: Follows one step commands consistently Safety/Judgement: Decreased awareness of safety, Decreased awareness of deficits Problem Solving: Slow processing, Requires verbal cues General Comments: Fluctuating attention during tasks, Pt pleasant when re-assured and encouraged.    Physical Exam: Blood pressure 93/69, pulse 84, temperature 98.3 F (36.8 C), temperature source Oral, resp. rate 19, height 6' (1.829 m), weight 101.1 kg, last menstrual period 01/30/2011, SpO2 99 %. Physical Exam  Constitutional: She is oriented to person, place, and time. She appears well-developed and well-nourished. No distress.  HENT:  Head: Normocephalic and atraumatic.  Right facial fullness.   Eyes: Pupils are equal, round, and reactive to light. EOM are normal.  Neck: Normal range  of motion. No tracheal deviation present. No thyromegaly present.  Cardiovascular: Normal rate and regular rhythm.  Exam reveals no friction rub.  No murmur heard. Respiratory: Effort normal. No stridor. No respiratory distress. She has no wheezes.  GI: Soft. She exhibits no distension. There is no abdominal tenderness.  Musculoskeletal: Normal range of motion.        General: No deformity.     Comments: BLE with 1+pedal edema and unna boots in place.  Mild pain with ABD/ER/IR left shoulder  Neurological: She is alert and oriented to person, place, and time.  Speech clear. Able to follow basic commands without difficulty. RUE 5/5. LUE 4+/5. RLE 3-4/5 prox to distal. LLE 3- to 4/5 prox to distal. No focal sensory loss, intact insight and awareness. Normal language  Skin:  Multiple healed lesions on BUE. Bilateral knees with scabbed abrasions.   Psychiatric: She has a normal mood and affect. Her behavior is normal.      Lab Results Last 48 Hours        Results for orders placed or performed during the hospital encounter of 03/24/18 (from the past 48 hour(s))  Glucose, capillary     Status: Abnormal    Collection Time: 04/03/18  4:35 PM  Result Value Ref Range    Glucose-Capillary 109 (H) 70 - 99 mg/dL    Comment 1 Notify RN      Comment 2 Document in Chart    Glucose, capillary     Status: None    Collection Time: 04/03/18  9:37 PM  Result Value Ref Range    Glucose-Capillary 97 70 - 99 mg/dL  .Cooxemetry Panel (carboxy, met, total hgb, O2 sat)     Status: Abnormal    Collection Time: 04/04/18  5:30 AM  Result Value Ref Range    Total hemoglobin 11.2 (L) 12.0 - 16.0 g/dL    O2 Saturation 71.2 %    Carboxyhemoglobin 1.9 (H) 0.5 - 1.5 %    Methemoglobin 1.0 0.0 - 1.5 %  Basic metabolic panel     Status: Abnormal    Collection Time: 04/04/18  5:56 AM  Result Value Ref Range    Sodium 137 135 - 145 mmol/L    Potassium 3.2 (L) 3.5 - 5.1 mmol/L    Chloride 99 98 - 111 mmol/L    CO2 26  22 - 32 mmol/L    Glucose, Bld 98 70 - 99 mg/dL    BUN 22 (H) 6 - 20 mg/dL    Creatinine, Ser 1.98 (H) 0.44 - 1.00 mg/dL    Calcium 8.4 (L) 8.9 - 10.3 mg/dL    GFR calc non Af Amer 27 (L) >60 mL/min    GFR calc Af Amer 32 (L) >60 mL/min    Anion gap 12 5 - 15      Comment: Performed at [facility] [address], [place], [place] [address]  CBC with Differential/Platelet     Status: Abnormal    Collection Time: 04/04/18  5:56 AM  Result Value Ref Range    WBC 9.7 4.0 - 10.5 K/uL    RBC 4.02 3.87 - 5.11 MIL/uL    Hemoglobin 12.4 12.0 - 15.0 g/dL    HCT 36.8 36.0 - 46.0 %    MCV 91.5 80.0 - 100.0 fL    MCH 30.8 26.0 - 34.0 pg    MCHC 33.7 30.0 - 36.0 g/dL    RDW 18.6 (H) 11.5 - 15.5 %    Platelets 430 (H) 150 - 400 K/uL    nRBC 0.0 0.0 - 0.2 %  Neutrophils Relative % 55 %    Neutro Abs 5.4 1.7 - 7.7 K/uL    Lymphocytes Relative 29 %    Lymphs Abs 2.8 0.7 - 4.0 K/uL    Monocytes Relative 10 %    Monocytes Absolute 1.0 0.1 - 1.0 K/uL    Eosinophils Relative 3 %    Eosinophils Absolute 0.3 0.0 - 0.5 K/uL    Basophils Relative 1 %    Basophils Absolute 0.1 0.0 - 0.1 K/uL    Immature Granulocytes 2 %    Abs Immature Granulocytes 0.15 (H) 0.00 - 0.07 K/uL      Comment: Performed at [facility] [address], [place], [place] [address]  Glucose, capillary     Status: None    Collection Time: 04/04/18  7:29 AM  Result Value Ref Range    Glucose-Capillary 99 70 - 99 mg/dL  Glucose, capillary     Status: Abnormal    Collection Time: 04/04/18 11:17 AM  Result Value Ref Range    Glucose-Capillary 124 (H) 70 - 99 mg/dL    Comment 1 Notify RN      Comment 2 Document in Chart    Glucose, capillary     Status: Abnormal    Collection Time: 04/04/18  4:11 PM  Result Value Ref Range    Glucose-Capillary 101 (H) 70 - 99 mg/dL    Comment 1 Notify RN      Comment 2 Document in Chart    Glucose, capillary     Status: None    Collection Time: 04/04/18  9:04 PM  Result  Value Ref Range    Glucose-Capillary 90 70 - 99 mg/dL  .Cooxemetry Panel (carboxy, met, total hgb, O2 sat)     Status: None    Collection Time: 04/05/18  4:19 AM  Result Value Ref Range    Total hemoglobin 13.8 12.0 - 16.0 g/dL    O2 Saturation 47.8 %    Carboxyhemoglobin 1.3 0.5 - 1.5 %    Methemoglobin 0.8 0.0 - 1.5 %  Basic metabolic panel     Status: Abnormal    Collection Time: 04/05/18  4:27 AM  Result Value Ref Range    Sodium 136 135 - 145 mmol/L    Potassium 4.1 3.5 - 5.1 mmol/L    Chloride 96 (L) 98 - 111 mmol/L    CO2 28 22 - 32 mmol/L    Glucose, Bld 121 (H) 70 - 99 mg/dL    BUN 22 (H) 6 - 20 mg/dL    Creatinine, Ser 2.19 (H) 0.44 - 1.00 mg/dL    Calcium 9.5 8.9 - 10.3 mg/dL    GFR calc non Af Amer 24 (L) >60 mL/min    GFR calc Af Amer 28 (L) >60 mL/min    Anion gap 12 5 - 15      Comment: Performed at [facility], [place] [address] [place], [place] [address]  CBC with Differential/Platelet     Status: Abnormal    Collection Time: 04/05/18  4:27 AM  Result Value Ref Range    WBC 7.9 4.0 - 10.5 K/uL    RBC 4.36 3.87 - 5.11 MIL/uL    Hemoglobin 13.3 12.0 - 15.0 g/dL    HCT 40.3 36.0 - 46.0 %    MCV 92.4 80.0 - 100.0 fL    MCH 30.5 26.0 - 34.0 pg    MCHC 33.0 30.0 - 36.0 g/dL    RDW 18.5 (H) 11.5 -  15.5 %    Platelets 483 (H) 150 - 400 K/uL    nRBC 0.0 0.0 - 0.2 %    Neutrophils Relative % 45 %    Neutro Abs 3.6 1.7 - 7.7 K/uL    Lymphocytes Relative 41 %    Lymphs Abs 3.2 0.7 - 4.0 K/uL    Monocytes Relative 8 %    Monocytes Absolute 0.6 0.1 - 1.0 K/uL    Eosinophils Relative 4 %    Eosinophils Absolute 0.3 0.0 - 0.5 K/uL    Basophils Relative 1 %    Basophils Absolute 0.1 0.0 - 0.1 K/uL    Immature Granulocytes 1 %    Abs Immature Granulocytes 0.07 0.00 - 0.07 K/uL      Comment: Performed at [facility] [address], [place], [place] [address]  Digoxin level     Status: Abnormal    Collection Time: 04/05/18  4:27 AM  Result Value Ref Range     Digoxin Level <0.2 (L) 0.8 - 2.0 ng/mL      Comment: RESULTS CONFIRMED BY MANUAL DILUTION Performed at [facility], [place] [address], [place], [place] [address]    Glucose, capillary     Status: None    Collection Time: 04/05/18  6:53 AM  Result Value Ref Range    Glucose-Capillary 99 70 - 99 mg/dL    Comment 1 Notify RN      Comment 2 Document in Chart    .Cooxemetry Panel (carboxy, met, total hgb, O2 sat)     Status: None    Collection Time: 04/05/18 10:30 AM  Result Value Ref Range    Total hemoglobin 13.1 12.0 - 16.0 g/dL    O2 Saturation 56.0 %    Carboxyhemoglobin 1.5 0.5 - 1.5 %    Methemoglobin 0.9 0.0 - 1.5 %  Glucose, capillary     Status: Abnormal    Collection Time: 04/05/18 11:22 AM  Result Value Ref Range    Glucose-Capillary 156 (H) 70 - 99 mg/dL    Comment 1 Notify RN      Comment 2 Document in Chart        Imaging Results (Last 48 hours)  No results found.           Medical Problem List and Plan: 1.  Functional and mobility deficits secondary to congestive heart failure and multiple medical issues             -admit to inpatient rehab 2.  Antithrombotics: -DVT/anticoagulation:  Pharmaceutical: Lovenox             -antiplatelet therapy: On Plavix daily.   3. Fibromyalgia/Lupus/Pain Management: Tylenol prn. 4. Mood: LCSW to follow for evaluation and support.              -antipsychotic agents: N/A 5. Neuropsych: This patient is capable of making decisions on her own behalf. 6. Skin/Wound Care: Unna boots replaced 3/17. Add ensure max to help with protein stores/ wound healing.  7. Fluids/Electrolytes/Nutrition: Monitor I/O. Check lytes in am. Continue 2000cc/FR.  8. NICM/VT s/p ICD:  On Ranolazine.  Reported to have 17 ICD discharges since 01/2018. Amiodarone decreased to 200 mg bid due to tremors. BB on hold due to low output.  9. SLE: controlled on hydroxychloroquine.  10. Acute renal failure: Serum Creatinine trending back up. Recheck labs daily  for now.  11. Gout flare: Treated with 3 day course of prednisone. Continue colchicine.  12. Acute on chronic  systolic CHF: Likely exacerbated by VT. Monitor weights daily and for signs of overload. SBP ranging in 90's today but asymptomatic. Continue spironolactone, digoxin and bidil.  Torsemide 20 mg to resume tomorrow.  13. Hypokalemia: Resolved with intermittent supplement. Recheck lytes in am.  14. T2DM: Hgb A1C- 6.8. Continue to  Monitor ac/hs --currently controlled on SSI alone but intake poor. Will monitor trend and resume 50/50 insulin as indicated.          Bary Leriche, PA-C 04/05/2018  Post Admission Physician Evaluation: 1. Functional deficits secondary  to debility. 2. Patient is admitted to receive collaborative, interdisciplinary care between the physiatrist, rehab nursing staff, and therapy team. 3. Patient's level of medical complexity and substantial therapy needs in context of that medical necessity cannot be provided at a lesser intensity of care such as a SNF. 4. Patient has experienced substantial functional loss from his/her baseline which was documented above under the "Functional History" and "Functional Status" headings.  Judging by the patient's diagnosis, physical exam, and functional history, the patient has potential for functional progress which will result in measurable gains while on inpatient rehab.  These gains will be of substantial and practical use upon discharge  in facilitating mobility and self-care at the household level. 5. Physiatrist will provide 24 hour management of medical needs as well as oversight of the therapy plan/treatment and provide guidance as appropriate regarding the interaction of the two. 6. The Preadmission Screening has been reviewed and patient status is unchanged unless otherwise stated above. 7. 24 hour rehab nursing will assist with bladder management, bowel management, safety, skin/wound care, disease management, medication  administration, pain management and patient education  and help integrate therapy concepts, techniques,education, etc. 8. PT will assess and treat for/with: Lower extremity strength, range of motion, stamina, balance, functional mobility, safety, adaptive techniques and equipment, pain mgt,  NMR, community reentry, family ed.   Goals are: supervision. 9. OT will assess and treat for/with: ADL's, functional mobility, safety, upper extremity strength, adaptive techniques and equipment, NMR, family ed.   Goals are: supervision. Therapy may proceed with showering this patient. 10. SLP will assess and treat for/with: n/a.  Goals are: n/a. 11. Case Management and Social Worker will assess and treat for psychological issues and discharge planning. 12. Team conference will be held weekly to assess progress toward goals and to determine barriers to discharge. 13. Patient will receive at least 3 hours of therapy per day at least 5 days per week. 14. ELOS: 10-14 days       15. Prognosis:  excellent   I have personally performed a face to face diagnostic evaluation of this patient and formulated the key components of the plan.  Additionally, I have personally reviewed laboratory data, imaging studies, as well as relevant notes and concur with the physician assistant's documentation above.  Meredith Staggers, MD, Mellody Drown

## 2018-04-06 ENCOUNTER — Inpatient Hospital Stay (HOSPITAL_COMMUNITY): Payer: Medicaid Other | Admitting: Occupational Therapy

## 2018-04-06 ENCOUNTER — Inpatient Hospital Stay (HOSPITAL_COMMUNITY): Payer: Medicaid Other | Admitting: Physical Therapy

## 2018-04-06 LAB — BASIC METABOLIC PANEL
Anion gap: 11 (ref 5–15)
BUN: 23 mg/dL — ABNORMAL HIGH (ref 6–20)
CO2: 23 mmol/L (ref 22–32)
Calcium: 9.3 mg/dL (ref 8.9–10.3)
Chloride: 100 mmol/L (ref 98–111)
Creatinine, Ser: 2.09 mg/dL — ABNORMAL HIGH (ref 0.44–1.00)
GFR calc Af Amer: 30 mL/min — ABNORMAL LOW (ref >60)
GFR calc non Af Amer: 26 mL/min — ABNORMAL LOW (ref >60)
Glucose, Bld: 172 mg/dL — ABNORMAL HIGH (ref 70–99)
Potassium: 4.1 mmol/L (ref 3.5–5.1)
Sodium: 134 mmol/L — ABNORMAL LOW (ref 135–145)

## 2018-04-06 LAB — GLUCOSE, CAPILLARY
Glucose-Capillary: 105 mg/dL — ABNORMAL HIGH (ref 70–99)
Glucose-Capillary: 115 mg/dL — ABNORMAL HIGH (ref 70–99)
Glucose-Capillary: 113 mg/dL — ABNORMAL HIGH (ref 70–99)
Glucose-Capillary: 111 mg/dL — ABNORMAL HIGH (ref 70–99)

## 2018-04-06 MED ORDER — GLUCERNA SHAKE PO LIQD: 237.0000 mL | Freq: Three times a day (TID) | ORAL | Status: DC

## 2018-04-06 MED ORDER — PRO-STAT SUGAR FREE PO LIQD
30.0000 mL | Freq: Two times a day (BID) | ORAL | Status: DC
  Administered 2018-04-06 – 2018-04-12 (×11): 30 mL via ORAL
  Filled 2018-04-06 (×12): qty 30

## 2018-04-06 NOTE — Progress Notes (Signed)
Inpatient Rehabilitation  Patient information reviewed and entered into eRehab system by Bonnie Overdorf M. Lantz Hermann, M.A., CCC/SLP, PPS Coordinator.  Information including medical coding, functional ability and quality indicators will be reviewed and updated through discharge.    

## 2018-04-06 NOTE — Evaluation (Signed)
Physical Therapy Assessment and Plan  Patient Details  Name: Katherine [name] MRN: [id number] Date of Birth: Jan 22, 1960  PT Diagnosis: Abnormality of gait, Difficulty walking, Impaired sensation and Muscle weakness Rehab Potential: Good ELOS: 5-7 days   Today's Date: 04/06/2018 PT Individual Time: 0800-0915 PT Individual Time Calculation (min): 75 min    Problem List:  Patient Active Problem List   Diagnosis Date Noted  . Debility 04/05/2018  . Hypokalemia   . History of CVA with residual deficit   . Diabetes mellitus type 2 in obese ([place])   . Acute gout of shoulder   . Acute on chronic systolic heart failure ([place]) 03/24/2018  . CHF (congestive heart failure) ([place]) 03/19/2018  . Morbid obesity due to excess calories ([place]) 09/24/2015  . Personal history of noncompliance with medical treatment, presenting hazards to health 02/19/2015  . Acute on chronic combined systolic and diastolic CHF (congestive heart failure) ([place]) 11/17/2012  . Non-ischemic cardiomyopathy ([place]) 10/03/2012  . AICD (automatic cardioverter/defibrillator) present 10/03/2012  . HTN (hypertension) 10/03/2012  . Diabetes mellitus with stage 3 chronic kidney disease ([place]) 10/03/2012  . Fibromyalgia 10/03/2012  . Dyslipidemia 10/03/2012  . Lupus ([place]) 10/03/2012  . Stroke [facility]) 10/03/2012    Past Medical History:  Past Medical History:  Diagnosis Date  . Anxiety   . Arthritis   . Asthma   . Bronchitis   . CHF (congestive heart failure) ([place])    a. EF 20-25% by cath in 11/2010 with cath showing normal cors  . CKD (chronic kidney disease) stage 3, GFR 30-59 ml/min (HCC)   . Fibromyalgia   . GERD (gastroesophageal reflux disease)   . Hallux valgus of left foot   . Hay fever   . Headache(784.0)   . History of pneumonia   . History of seizures   . Hyperlipidemia   . Hypertension   . Nonischemic cardiomyopathy ([place])   . Obesity   . Stroke ([place])   . Systemic lupus erythematosus ([place])   . Type 2  diabetes mellitus ([place])    Past Surgical History:  Past Surgical History:  Procedure Laterality Date  . ICD  02/13/2011   Medtronic Protecta DR XT  . ICD GENERATOR CHANGEOUT N/A 01/07/2017   Procedure: [place];  Surgeon: Evans Lance, MD;  Location: [facility] CV LAB;  Service: Cardiovascular;  Laterality: N/A;  . IMPLANTABLE CARDIOVERTER DEFIBRILLATOR IMPLANT N/A 02/13/2011   Procedure: IMPLANTABLE CARDIOVERTER DEFIBRILLATOR IMPLANT;  Surgeon: Sanda Klein, MD;  Location: [facility] CATH LAB;  Service: Cardiovascular;  Laterality: N/A;  . LEFT HEART CATHETERIZATION WITH CORONARY ANGIOGRAM N/A 12/05/2010   Procedure: LEFT HEART CATHETERIZATION WITH CORONARY ANGIOGRAM;  Surgeon: Pixie Casino, MD;  Location: [facility] CATH LAB;  Service: Cardiovascular;  Laterality: N/A;  . RIGHT ANKLE    . TUBAL LIGATION    . US ECHOCARDIOGRAPHY  11/09/2010   mild LV enlargement w/conc. LVH & severe global hypokinesis EF 30-35%,mod. diastolic dysfunction, mod. TR,mild AI,mild to mod MR,mild PI    Assessment & Plan Clinical Impression:  Katherine [name] is a 58 year old female with history of SLE, CVA with left HP and HH, systolic HF due to NICM/VT s/p ICD, T2DM, CKD who was admitted to [facility] 02/29/20with nausea/vomiting, worsening of dyspnea as well as orthopnea. GI work-up revealed mildly elevated LFTs due to cholestasis but no evidence of obstruction. GI symptoms felt to be due to amiodarone and dose was decreased. Patient was noted to be markedly volume overloaded but was  unable to tolerate diuresis due to acute on chronic renal failure as well as low blood pressures. She was transferred to [facility] on 03/24/18 for management by heart failure team.  She wasplaceddobutamine andhas tolerated diuresis with resolution of fluid overload. Dobutamine weaned off by 3/15and medications being adjusted with weight down from 134.7 kg--> 101.1 kg.AKI being monitored and serum  creatininenoted to betrending back up therefore Demadex held today--to resume at lower dose tomorrow. Hospital course significant for issues with left shoulder pain felt to be due to gout flare, severe constipation requiring disimpaction, hemorrhoidal flare as well as intermittent issues with nausea. Respiratory status and activity tolerance hasimproved but patient noted to be deconditioned. Therapy evaluations revealed functional decline and CIR recommended for follow-up therapy. Patient transferred to CIR on 04/05/2018 .   Patient currently requires min with mobility secondary to muscle weakness, decreased cardiorespiratoy endurance and decreased standing balance, decreased postural control and decreased balance strategies.  Prior to hospitalization, patient was modified independent  with mobility and lived with Significant other in a House home.  Home access is 4Stairs to enter.  Patient will benefit from skilled PT intervention to maximize safe functional mobility, minimize fall risk and decrease caregiver burden for planned discharge home with 24 hour supervision.  Anticipate patient will benefit from follow up [facility] at discharge.  PT - End of Session Activity Tolerance: Tolerates 30+ min activity with multiple rests Endurance Deficit: Yes Endurance Deficit Description: needs frequent rest breaks PT Assessment Rehab Potential (ACUTE/IP ONLY): Good PT Barriers to Discharge: Medical stability;Home environment access/layout PT Patient demonstrates impairments in the following area(s): Balance;Endurance;Safety;Sensory PT Transfers Functional Problem(s): Bed Mobility;Bed to Chair;Car;Furniture;Floor PT Locomotion Functional Problem(s): Ambulation;Wheelchair Mobility;Stairs PT Plan PT Intensity: Minimum of 1-2 x/day ,45 to 90 minutes PT Frequency: 5 out of 7 days PT Duration Estimated Length of Stay: 5-7 days PT Treatment/Interventions: Ambulation/gait training;Balance/vestibular  training;Community reintegration;Discharge planning;Disease management/prevention;DME/adaptive equipment instruction;Functional mobility training;Neuromuscular re-education;Pain management;Patient/family education;Psychosocial support;Stair training;Therapeutic Activities;Therapeutic Exercise;UE/LE Strength taining/ROM;UE/LE Coordination activities;Visual/perceptual remediation/compensation;Wheelchair propulsion/positioning PT Transfers Anticipated Outcome(s): Supervision PT Locomotion Anticipated Outcome(s): Supervision with LRAD PT Recommendation Follow Up Recommendations: Home health PT Patient destination: Home Equipment Recommended: Rolling walker with 5" wheels Equipment Details: pt already owns RW and manual w/c  Skilled Therapeutic Intervention Evaluation completed (see details above and below) with education on PT POC and goals and individual treatment initiated with focus on functional transfer and gait assessment. Pt received seated in w/c in room handed off from NT, agreeable to PT session. Pt is min A to stand to RW throughout session, min A for gait up to 180 ft with RW and min A for balance with 2 standing rest breaks. Pt ambulates with a wide BOS and B flat feet. Ascend/descend 4 stairs with 2 handrails and min A. Car transfer with min A with RW. Supine to/from sit with Supervision. Education with pt about expected LOS, goals, rehab scheduled, etc. Pt left seated in recliner in room with needs in reach and chair alarm in place at end of session.  PT Evaluation Precautions/Restrictions Precautions Precautions: Fall Restrictions Weight Bearing Restrictions: No Pain Pain Assessment Pain Scale: 0-10 Pain Score: 0-No pain Home Living/Prior Functioning Home Living Available Help at Discharge: Family;Friend(s);Available 24 hours/day Type of Home: House Home Access: Stairs to enter [employer] of Steps: 4 Entrance Stairs-Rails: Right Home Layout: One level  Lives  With: Significant other Prior Function Level of Independence: Needs assistance with ADLs;Needs assistance with gait  Able to Take Stairs?: Yes Driving:  No Vocation: On disability Comments: Reports 2 falls in past 6 months Vision/Perception  Vision - History Baseline Vision: Other (comment) Visual History: Other (comment)(loss of L peripheral vision after CVA) Vision - Assessment Vision Assessment: Vision impaired - to be further tested in functional context Perception Perception: Within Functional Limits Praxis Praxis: Intact  Cognition Overall Cognitive Status: Within Functional Limits for tasks assessed Arousal/Alertness: Awake/alert Orientation Level: Oriented X4 Attention: Focused Focused Attention: Appears intact Memory: Appears intact Awareness: Appears intact Problem Solving: Appears intact Safety/Judgment: Appears intact Sensation Sensation Light Touch: Impaired Detail Light Touch Impaired Details: Impaired RLE;Impaired LLE(peripheral neuropathy, BLE unna boots) Proprioception: Appears Intact Coordination Gross Motor Movements are Fluid and Coordinated: No Fine Motor Movements are Fluid and Coordinated: Yes Coordination and Movement Description: limited by generalized weakness Heel Shin Test: decreased speed B Motor  Motor Motor: Within Functional Limits  Mobility Bed Mobility Bed Mobility: Rolling Right;Rolling Left;Supine to Sit;Sit to Supine Rolling Right: Supervision/verbal cueing Rolling Left: Supervision/Verbal cueing Supine to Sit: Supervision/Verbal cueing Sit to Supine: Supervision/Verbal cueing Transfers Transfers: Sit to Stand;Stand Pivot Transfers Sit to Stand: Minimal Assistance - Patient > 75% Stand Pivot Transfers: Minimal Assistance - Patient > 75% Stand Pivot Transfer Details: Verbal cues for sequencing;Verbal cues for technique;Verbal cues for precautions/safety;Verbal cues for safe use of DME/AE Transfer (Assistive device): Rolling  walker Locomotion  Gait Gait Distance (Feet): 180 Feet Assistive device: Rolling walker Gait Gait Pattern: Impaired(wide BOS, flat foot B) Gait velocity: decreased Stairs / Additional Locomotion Stairs: Yes Stairs Assistance: Minimal Assistance - Patient > 75% Stair Management Technique: Two rails;Step to pattern Number of Stairs: 4 Height of Stairs: 6 Wheelchair Mobility Wheelchair Mobility: Yes Wheelchair Assistance: Chartered loss adjuster: Both upper extremities Wheelchair Parts Management: Needs assistance Distance: 50  Trunk/Postural Assessment  Cervical Assessment Cervical Assessment: Exceptions to WFL(forward head) Thoracic Assessment Thoracic Assessment: Exceptions to WFL(rounded shoulders) Lumbar Assessment Lumbar Assessment: Exceptions to WFL(posterior pelvic tilt) Postural Control Postural Control: Deficits on evaluation Righting Reactions: delayed  Balance Balance Balance Assessed: Yes Static Sitting Balance Static Sitting - Balance Support: No upper extremity supported;Feet supported Static Sitting - Level of Assistance: 5: Stand by assistance Dynamic Sitting Balance Dynamic Sitting - Balance Support: No upper extremity supported;Feet supported Dynamic Sitting - Level of Assistance: 5: Stand by assistance Static Standing Balance Static Standing - Balance Support: Bilateral upper extremity supported;During functional activity Static Standing - Level of Assistance: 4: Min assist Dynamic Standing Balance Dynamic Standing - Balance Support: Bilateral upper extremity supported;During functional activity Dynamic Standing - Level of Assistance: 4: Min assist Extremity Assessment   RLE Assessment RLE Assessment: Within Functional Limits General Strength Comments: 4/5 grossly LLE Assessment LLE Assessment: Within Functional Limits Active Range of Motion (AROM) Comments: decreased DF General Strength Comments: 4/5 grossly, 3/5 ankle  DF    Refer to Care Plan for Long Term Goals  Recommendations for other services: None   Discharge Criteria: Patient will be discharged from PT if patient refuses treatment 3 consecutive times without medical reason, if treatment goals not met, if there is a change in medical status, if patient makes no progress towards goals or if patient is discharged from hospital.  The above assessment, treatment plan, treatment alternatives and goals were discussed and mutually agreed upon: by patient   Excell Seltzer, PT, DPT  04/06/2018, 10:26 AM

## 2018-04-06 NOTE — Progress Notes (Addendum)
[facility] PHYSICAL MEDICINE & REHABILITATION PROGRESS NOTE   Subjective/Complaints: Pt up at sink washing herself--said that NT set her up in w/c. No new issues overnight. Able to sleep  ROS: Patient denies fever, rash, sore throat, blurred vision, nausea, vomiting, diarrhea, cough, shortness of breath or chest pain, joint or back pain, headache, or mood change.   Objective:   No results found. Recent Labs    04/04/18 0556 04/05/18 0427  WBC 9.7 7.9  HGB 12.4 13.3  HCT 36.8 40.3  PLT 430* 483*   Recent Labs    04/04/18 0556 04/05/18 0427  NA 137 136  K 3.2* 4.1  CL 99 96*  CO2 26 28  GLUCOSE 98 121*  BUN 22* 22*  CREATININE 1.98* 2.19*  CALCIUM 8.4* 9.5    Intake/Output Summary (Last 24 hours) at 04/06/2018 1610 Last data filed at 04/06/2018 0742 Gross per 24 hour  Intake 300 ml  Output -  Net 300 ml     Physical Exam: Vital Signs Blood pressure 113/68, pulse 84, temperature 98 F (36.7 C), temperature source Oral, resp. rate 18, height 6' (1.829 m), weight 97.5 kg, last menstrual period 01/30/2011, SpO2 99 %. Constitutional: No distress . Vital signs reviewed. HEENT: EOMI, oral membranes moist Neck: supple Cardiovascular: RRR without murmur. No JVD    Respiratory: CTA Bilaterally without wheezes or rales. Normal effort    GI: BS +, non-tender, non-distended  Musculoskeletal:Normal range of motion.  General: No deformity.  Comments: BLE with 1+pedal edema and unna boots in place. Mild pain with ABD/ER/IR left shoulder is present Neurological: She is alertand oriented to person, place, and time. Speech clear. Able to follow basic commands without difficulty. RUE 5/5. LUE 4+/5. RLE 3-4/5 prox to distal. LLE 3- to 4/5 prox to distal. No focal sensory loss, cognitively intact Skin: Multiple healed lesions on BUE. Bilateral knees with scabbed abrasions without change Psychiatric: pleasant and appropriate.    Assessment/Plan: 1. Functional  deficits secondary to debility which require 3+ hours per day of interdisciplinary therapy in a comprehensive inpatient rehab setting.  Physiatrist is providing close team supervision and 24 hour management of active medical problems listed below.  Physiatrist and rehab team continue to assess barriers to discharge/monitor patient progress toward functional and medical goals  Care Tool:  Bathing              Bathing assist       Upper Body Dressing/Undressing Upper body dressing        Upper body assist      Lower Body Dressing/Undressing Lower body dressing            Lower body assist       Toileting Toileting    Toileting assist Assist for toileting: Contact Guard/Touching assist     Transfers Chair/bed transfer  Transfers assist           Locomotion Ambulation   Ambulation assist              Walk 10 feet activity   Assist           Walk 50 feet activity   Assist           Walk 150 feet activity   Assist           Walk 10 feet on uneven surface  activity   Assist           Wheelchair     Assist  Wheelchair 50 feet with 2 turns activity    Assist            Wheelchair 150 feet activity     Assist          Medical Problem List and Plan: 1.Functional and mobility deficitssecondary to congestive heart failure and multiple medical issues -Patient is beginning CIR therapies today including PT and OT  2. Antithrombotics: -DVT/anticoagulation:Pharmaceutical:Lovenox -antiplatelet therapy: On Plavix daily. 3.Fibromyalgia/Lupus/Pain Management:Tylenol prn. 4. Mood:LCSW to follow for evaluation and support. -antipsychotic agents: N/A 5. Neuropsych: This patientiscapable of making decisions onherown behalf. 6. Skin/Wound Care:Unna boots replaced 3/17. Added ensure max to help with protein stores/ wound healing. 7.  Fluids/Electrolytes/Nutrition:Monitor I/O.   Continue 2000cc/FR.   -labs pending today 8. NICM/VT s/p ICD: On Ranolazine. Reported to have 17 ICD discharges since 01/2018. Amiodarone decreased to 200 mg bid due to tremors. BB on hold due to low output.  9. SLE: controlled on hydroxychloroquine.  10. Acute renal failure: Serum Creatinine trending back up.    -labs pending.  11. Gout flare: Treated with 3 day course ofprednisone. Continuecolchicine.  12. Acute on chronic systolic CHF: Likely exacerbated by VT.    -Continue spironolactone, digoxin and bidil.   -Torsemide 20 mg to resume today  - Filed Weights   04/05/18 1623 04/06/18 0542  Weight: 101.4 kg 97.5 kg    13. Hypokalemia: Resolved with intermittent supplement.   -follow up labs as available.  14. T2DM: Hgb A1C- 6.8. Continue to Monitor ac/hs --remains controlled on SSI alone but intake has been poor.    -resume 50/50 insulin as indicated.     LOS: 1 days A FACE TO FACE EVALUATION WAS PERFORMED  Ranelle Oyster 04/06/2018, 8:33 AM

## 2018-04-06 NOTE — Progress Notes (Addendum)
Advanced Heart Failure Rounding Note  PCP-Cardiologist: Lewayne Bunting, MD   Subjective:    Cr 2.06 -> 1.98 -> 2.19 -> Pending  Feeling good this am. Denies SOB. Working well with therapy. No dizziness or lightheadedness.   BMET pending today.   Objective:   Weight Range: 97.5 kg Body mass index is 29.15 kg/m.   Vital Signs: Temp:  [97.4 F (36.3 C)-98.1 F (36.7 C)] 98 F (36.7 C) (03/18 0542) Pulse Rate:  [80-84] 84 (03/18 0542) Resp:  [18-20] 18 (03/18 0542) BP: (107-113)/(67-72) 113/68 (03/18 0542) SpO2:  [98 %-100 %] 99 % (03/18 0759) Weight:  [97.5 kg-101.4 kg] 97.5 kg (03/18 0542) Last BM Date: 04/04/18  Weight change: Filed Weights   04/05/18 1623 04/06/18 0542  Weight: 101.4 kg 97.5 kg   Intake/Output:   Intake/Output Summary (Last 24 hours) at 04/06/2018 0833 Last data filed at 04/06/2018 0742 Gross per 24 hour  Intake 300 ml  Output -  Net 300 ml    Physical Exam    General:  Obese. NAD HEENT: Normal Neck: Supple. JVP 5-6 cm. Carotids 2+ bilat; no bruits. No lymphadenopathy or thyromegaly appreciated. Cor: PMI nondisplaced. Regular rate & rhythm. No rubs, gallops or murmurs. Lungs: Clear Abdomen: Soft, nontender, nondistended. No hepatosplenomegaly. No bruits or masses. Good bowel sounds. Extremities: No cyanosis, clubbing, or rash. BLE UNNA boots.  Neuro: Alert & orientedx3, cranial nerves grossly intact. moves all 4 extremities w/o difficulty. Affect pleasant  Telemetry   Not connected (CIR)  EKG    No new tracings.    Labs    CBC Recent Labs    04/04/18 0556 04/05/18 0427  WBC 9.7 7.9  NEUTROABS 5.4 3.6  HGB 12.4 13.3  HCT 36.8 40.3  MCV 91.5 92.4  PLT 430* 483*   Basic Metabolic Panel Recent Labs    66/06/30 0556 04/05/18 0427  NA 137 136  K 3.2* 4.1  CL 99 96*  CO2 26 28  GLUCOSE 98 121*  BUN 22* 22*  CREATININE 1.98* 2.19*  CALCIUM 8.4* 9.5   Liver Function Tests No results for input(s): AST, ALT, ALKPHOS,  BILITOT, PROT, ALBUMIN in the last 72 hours. No results for input(s): LIPASE, AMYLASE in the last 72 hours. Cardiac Enzymes No results for input(s): CKTOTAL, CKMB, CKMBINDEX, TROPONINI in the last 72 hours.  BNP: BNP (last 3 results) Recent Labs    01/12/18 0410 03/19/18 0702 03/24/18 1752  BNP 2,922.0* >4,500.0* 893.2*   ProBNP (last 3 results) No results for input(s): PROBNP in the last 8760 hours.  D-Dimer No results for input(s): DDIMER in the last 72 hours. Hemoglobin A1C No results for input(s): HGBA1C in the last 72 hours. Fasting Lipid Panel No results for input(s): CHOL, HDL, LDLCALC, TRIG, CHOLHDL, LDLDIRECT in the last 72 hours. Thyroid Function Tests No results for input(s): TSH, T4TOTAL, T3FREE, THYROIDAB in the last 72 hours.  Invalid input(s): FREET3  Other results:  Imaging    No results found.  Medications:    Scheduled Medications: . amiodarone  200 mg Oral BID  . clopidogrel  75 mg Oral Daily  . colchicine  0.6 mg Oral Daily  . digoxin  0.0625 mg Oral Daily  . docusate sodium  100 mg Oral BID  . enoxaparin (LOVENOX) injection  40 mg Subcutaneous Q24H  . hydroxychloroquine  200 mg Oral BID  . insulin aspart  0-5 Units Subcutaneous QHS  . insulin aspart  0-9 Units Subcutaneous TID WC  . isosorbide-hydrALAZINE  1 tablet Oral TID  . loratadine  10 mg Oral Daily  . mometasone-formoterol  2 puff Inhalation BID  . multivitamin with minerals  1 tablet Oral Daily  . Ensure Max Protein  11 oz Oral BID  . ranolazine  500 mg Oral BID  . spironolactone  25 mg Oral Daily  . torsemide  80 mg Oral Daily  . venlafaxine XR  150 mg Oral Q breakfast     Infusions:   PRN Medications:  acetaminophen, alum & mag hydroxide-simeth, diphenhydrAMINE, guaiFENesin-dextromethorphan, prochlorperazine **OR** prochlorperazine **OR** prochlorperazine, traZODone  Assessment/Plan   1. Acute on chronic systolic CHF: Nonischemic cardiomyopathy. Echo in 12/19 with EF  20-25%, mildly decreased RV systolic function. Medtronic ICD. She had been [facility] with CHF exacerbation and VT, creatinine rose considerably with attempts at diuresis. Lactate was elevated. Initial co-ox 38%. SBP was low at times in the 80s-90s.  Dobutamine was started at 4 mcg/kg/min and she was diuresed with IV Lasix. Weight is down considerably and she is now on po diuretic and off dobutamine. Volume status OK on exam.  - Resume torsemide 80 mg daily today and follow renal function closely.  - Continue spironolactone 25 mg daily  - Continue low dose digoxin. Dig level <0.2 04/05/18. - Continue Bidil 1 tab tid. SBP 110-120s - Probably not a good LVAD candidate with poor mobility and renal dysfunction. However, could consider if needed if she makes progress PT.  2. AKI: Creatinine up to peak of 3.17 from 1.11. Suspect cardiorenal in setting of low cardiac output. She was admitted markedly volume overloaded. Creatinine trending back up 1.98 -> 2.19, pending this am.  3. VT: Patient had 17 episodes of VT with ICD discharges since 1/20. Suspect that CHF was trigger of VT. Has been quiescent in hospital. No change. - Continue amiodarone 200 mg BID for now. Has a tremor that may be amiodarone-related.   - Continue ranolazine.  - No BB with low output.  4. H/o CVA: On Plavix. No change.  5. H/o SLE: On hydroxychloroquine.No change.  6. UTI: Completed cipro. No change.  7. Gout: Suspect flare in shoulders.  - Continue colchicine - Finished 3 days prednisone. Resolved  Medication concerns reviewed with patient and pharmacy team. Barriers identified: None at this time.  Length of Stay: 1  Katherine [name]  04/06/2018, 8:33 AM  Advanced Heart Failure Team Pager [phone] (M-F; 7a - 4p)  Please contact [facility] for night-coverage after hours (4p -7a ) and weekends on [email or web address]   Patient seen with PA, agree with the above note.  Creatinine lower today at 2.09, BP stable.    Continue current meds, continue PT.   Katherine [name] 04/06/2018 11:47 AM

## 2018-04-06 NOTE — Progress Notes (Signed)
Initial Nutrition Assessment  DOCUMENTATION CODES:   Not applicable  INTERVENTION:   D/C Ensure Max   Add Glucerna Shake po TID, each supplement provides 220 kcal and 10 grams of protein  Add MVI daily  NUTRITION DIAGNOSIS:   Inadequate oral intake related to decreased appetite as evidenced by per patient/family report.  GOAL:   Patient will meet greater than or equal to 90% of their needs  MONITOR:   PO intake, Supplement acceptance, Weight trends, Labs, I & O's  REASON FOR ASSESSMENT:   Consult Diet education  ASSESSMENT:   Patient with PMH significant for CHF secondary to nonischemic cardiomyopathy s/p ICD, CKD III, HTN, DM, HLD, and CVA. Recently admitted at [facility] 2/29 with cholestasis without obstruction and was transferred to [facility] for further management by HF team. Moved to CIR for functional and mobility deficits 2/2 to CHF.    RD consulted for diet education. Pt provided low sodium and diabetic diet education by clinical nutrition on 3/2. Briefly went over any questions pt had and provided more handouts for reading material.   Her appetite is slowly increasing. She does not like the taste of hospital food. Meal completions charted as 50-80% for her last two meals. She wishes to keep supplementation as she finds herself skipping protein options on meal trays. RD to change Ensure to Glucerna as she likes the taste better.   Weight noted to decrease from 102.6 on 3/16 (last RD visit) to 97.5 kg today. Will continue to monitor trends.   Medications reviewed and include: colace, SS novolog, MVI with minerals, aldactone, demadex once daily Labs reviewed: Na 134 (L)   NUTRITION - FOCUSED PHYSICAL EXAM:    Most Recent Value  Orbital Region  No depletion  Upper Arm Region  No depletion  Thoracic and Lumbar Region  No depletion  Buccal Region  No depletion  Temple Region  No depletion  Clavicle Bone Region  No depletion  Clavicle and Acromion Bone Region  No  depletion  Scapular Bone Region  No depletion  Dorsal Hand  No depletion  Patellar Region  No depletion  Anterior Thigh Region  No depletion  Posterior Calf Region  No depletion  Edema (RD Assessment)  Mild  Hair  Reviewed  Eyes  Reviewed  Mouth  Reviewed  Skin  Reviewed  Nails  Reviewed     Diet Order:   Diet Order            Diet heart healthy/carb modified Room service appropriate? Yes; Fluid consistency: Thin; Fluid restriction: 2000 mL Fluid  Diet effective now              EDUCATION NEEDS:   Education needs have been addressed  Skin:  Skin Assessment: Reviewed RN Assessment  Last BM:  3/17  Height:   Ht Readings from Last 1 Encounters:  04/05/18 6' (1.829 m)    Weight:   Wt Readings from Last 1 Encounters:  04/06/18 97.5 kg    Ideal Body Weight:  80.9 kg  BMI:  Body mass index is 29.15 kg/m.  Estimated Nutritional Needs:   Kcal:  2000-2200 kcal  Protein:  105-115 grams  Fluid:  2000 ml fluid restriction   Vanessa Kick RD, LDN Clinical Nutrition Pager # - [phone]

## 2018-04-06 NOTE — Evaluation (Signed)
Occupational Therapy Assessment and Plan  Patient Details  Name: Katherine [name] MRN: [id number] Date of Birth: 1960/02/04  OT Diagnosis: abnormal posture, muscle weakness (generalized) and decreased sensation Rehab Potential: Rehab Potential (ACUTE ONLY): Good ELOS: 5-7 days   Today's Date: 04/06/2018 OT Individual Time: 6222-9798 OT Individual Time Calculation (min): 55 min     Problem List:  Patient Active Problem List   Diagnosis Date Noted  . Debility 04/05/2018  . Hypokalemia   . History of CVA with residual deficit   . Diabetes mellitus type 2 in obese ([place])   . Acute gout of shoulder   . Chronic systolic CHF (congestive heart failure) ([place]) 03/24/2018  . CHF (congestive heart failure) ([place]) 03/19/2018  . Morbid obesity due to excess calories ([place]) 09/24/2015  . Personal history of noncompliance with medical treatment, presenting hazards to health 02/19/2015  . Acute on chronic combined systolic and diastolic CHF (congestive heart failure) ([place]) 11/17/2012  . Non-ischemic cardiomyopathy ([place]) 10/03/2012  . AICD (automatic cardioverter/defibrillator) present 10/03/2012  . HTN (hypertension) 10/03/2012  . Diabetes mellitus with stage 3 chronic kidney disease ([place]) 10/03/2012  . Fibromyalgia 10/03/2012  . Dyslipidemia 10/03/2012  . Lupus ([place]) 10/03/2012  . Stroke [facility]) 10/03/2012    Past Medical History:  Past Medical History:  Diagnosis Date  . Anxiety   . Arthritis   . Asthma   . Bronchitis   . CHF (congestive heart failure) ([place])    a. EF 20-25% by cath in 11/2010 with cath showing normal cors  . CKD (chronic kidney disease) stage 3, GFR 30-59 ml/min (HCC)   . Fibromyalgia   . GERD (gastroesophageal reflux disease)   . Hallux valgus of left foot   . Hay fever   . Headache(784.0)   . History of pneumonia   . History of seizures   . Hyperlipidemia   . Hypertension   . Nonischemic cardiomyopathy ([place])   . Obesity   . Stroke ([place])   . Systemic lupus  erythematosus ([place])   . Type 2 diabetes mellitus ([place])    Past Surgical History:  Past Surgical History:  Procedure Laterality Date  . ICD  02/13/2011   Medtronic Protecta DR XT  . ICD GENERATOR CHANGEOUT N/A 01/07/2017   Procedure: [place];  Surgeon: Evans Lance, MD;  Location: [place] CV LAB;  Service: Cardiovascular;  Laterality: N/A;  . IMPLANTABLE CARDIOVERTER DEFIBRILLATOR IMPLANT N/A 02/13/2011   Procedure: IMPLANTABLE CARDIOVERTER DEFIBRILLATOR IMPLANT;  Surgeon: Sanda Klein, MD;  Location: [facility] CATH LAB;  Service: Cardiovascular;  Laterality: N/A;  . LEFT HEART CATHETERIZATION WITH CORONARY ANGIOGRAM N/A 12/05/2010   Procedure: LEFT HEART CATHETERIZATION WITH CORONARY ANGIOGRAM;  Surgeon: Pixie Casino, MD;  Location: [facility] CATH LAB;  Service: Cardiovascular;  Laterality: N/A;  . RIGHT ANKLE    . TUBAL LIGATION    . US ECHOCARDIOGRAPHY  11/09/2010   mild LV enlargement w/conc. LVH & severe global hypokinesis EF 30-35%,mod. diastolic dysfunction, mod. TR,mild AI,mild to mod MR,mild PI    Assessment & Plan Clinical Impression: Patient is a 58 y.o. year old female with history of SLE, CVA with left HP and HH, systolic HF due to NICM/VT s/p ICD, T2DM, CKD who was admitted to [facility] 02/29/20with nausea/vomiting, worsening of dyspnea as well as orthopnea. GI work-up revealed mildly elevated LFTs due to cholestasis but no evidence of obstruction. GI symptoms felt to be due to amiodarone and dose was decreased. Patient was noted to be markedly volume overloaded but  was unable to tolerate diuresis due to acute on chronic renal failure as well as low blood pressures. She was transferred to [facility] on 03/24/18 for management by heart failure team.  She wasplaceddobutamine andhas tolerated diuresis with resolution of fluid overload. Dobutamine weaned off by 3/15and medications being adjusted with weight down from 134.7 kg--> 101.1 kg.AKI being  monitored and serum creatininenoted to betrending back up therefore Demadex held today--to resume at lower dose tomorrow. Hospital course significant for issues with left shoulder pain felt to be due to gout flare, severe constipation requiring disimpaction, hemorrhoidal flare as well as intermittent issues with nausea. Respiratory status and activity tolerance hasimproved but patient noted to be deconditioned .  Patient transferred to CIR on 04/05/2018 .    Patient currently requires min with basic self-care skills and IADL secondary to muscle weakness, decreased cardiorespiratoy endurance and decreased sitting balance, decreased standing balance, decreased postural control and decreased balance strategies.  Prior to hospitalization, patient could complete ADLs and IADLs with modified independent .  Patient will benefit from skilled intervention to increase independence with basic self-care skills prior to discharge home with care partner.  Anticipate patient will require 24 hour supervision and follow up home health.  OT - End of Session Activity Tolerance: Decreased this session Endurance Deficit: Yes Endurance Deficit Description: needs frequent rest breaks OT Assessment Rehab Potential (ACUTE ONLY): Good OT Barriers to Discharge: Other (comments) OT Barriers to Discharge Comments: none known at this time OT Patient demonstrates impairments in the following area(s): Balance;Endurance;Motor;Safety OT Basic ADL's Functional Problem(s): Grooming;Bathing;Dressing;Toileting OT Advanced ADL's Functional Problem(s): Laundry OT Transfers Functional Problem(s): Toilet;Tub/Shower OT Additional Impairment(s): None OT Plan OT Intensity: Minimum of 1-2 x/day, 45 to 90 minutes OT Frequency: 5 out of 7 days OT Duration/Estimated Length of Stay: 5-7 days OT Treatment/Interventions: Balance/vestibular training;Self Care/advanced ADL retraining;Therapeutic Exercise;Wheelchair  propulsion/positioning;DME/adaptive equipment instruction;Pain management;UE/LE Strength taining/ROM;Community reintegration;Functional electrical stimulation;Patient/family education;UE/LE Coordination activities;Discharge planning;Functional mobility training;Psychosocial support;Therapeutic Activities OT Self Feeding Anticipated Outcome(s): n/a OT Basic Self-Care Anticipated Outcome(s): S OT Toileting Anticipated Outcome(s): S OT Bathroom Transfers Anticipated Outcome(s): S OT Recommendation Recommendations for Other Services: Neuropsych consult Patient destination: Home Follow Up Recommendations: 24 hour supervision/assistance;Home health OT Equipment Recommended: To be determined   Skilled Therapeutic Intervention Upon entering the room, pt seated in recliner chair with no c/o pain and agreeable to OT intervention. Pt performed sit >stand with min A and ambulating to bathroom for toileting with min guard for balance. Pt performed clothing management and hygiene from seated position with min guard overall. Pt doffing clothing while seated on toilet and transferred onto TTB with bathing at shower level. B LEs covered for shower and pt required min A for bathing while seated with lateral leans to wash buttocks. Pt transferred to sit onto toilet to don clothing this session with min A for standing balance during LB clothing management. Pt requesting to return to bed at end of session secondary to fatigue. Min guard for sit >supine. Bed alarm activated and call bell within reach.   OT Evaluation Precautions/Restrictions  Precautions Precautions: Fall Precaution Comments: ICD Restrictions Weight Bearing Restrictions: No Vital Signs Therapy Vitals Temp: 98 F (36.7 C) Pulse Rate: 83 Resp: 18 BP: 115/74 Patient Position (if appropriate): Lying Oxygen Therapy SpO2: 98 % O2 Device: Room Air Pain Pain Assessment Pain Scale: 0-10 Pain Score: 0-No pain Home Living/Prior Functioning Home  Living Family/patient expects to be discharged to:: Private residence Living Arrangements: Spouse/significant other Available Help at Discharge:  Family, Friend(s), Available 24 hours/day Type of Home: House Home Access: Stairs to enter Technical brewer of Steps: 4 Entrance Stairs-Rails: Right Home Layout: One level Bathroom Shower/Tub: Multimedia programmer: Standard  Lives With: Significant other Prior Function Level of Independence: Needs assistance with ADLs, Needs assistance with gait  Able to Take Stairs?: Yes Driving: No Vocation: On disability Comments: Reports 2 falls in past 6 months. Loves to go to church Vision Baseline Vision/History: Wears glasses Wears Glasses: Reading only Patient Visual Report: Other (comment);Peripheral vision impairment(prior L field cut from CVA) Cognition Overall Cognitive Status: Within Functional Limits for tasks assessed Arousal/Alertness: Awake/alert Orientation Level: Person;Place;Situation Person: Oriented Place: Oriented Situation: Oriented Year: 2020 Month: March Day of Week: Correct Memory: Appears intact Immediate Memory Recall: Sock;Blue;Bed Memory Recall: Sock;Blue;Bed Focused Attention: Appears intact Awareness: Appears intact Problem Solving: Appears intact Safety/Judgment: Appears intact Sensation Sensation Light Touch: Impaired Detail Light Touch Impaired Details: Impaired RLE;Impaired LLE Proprioception: Appears Intact Coordination Gross Motor Movements are Fluid and Coordinated: No Fine Motor Movements are Fluid and Coordinated: Yes Coordination and Movement Description: limited by generalized weakness Motor  Motor Motor: Within Functional Limits Mobility  Bed Mobility Bed Mobility: Rolling Right;Rolling Left;Supine to Sit;Sit to Supine Rolling Right: Supervision/verbal cueing Rolling Left: Supervision/Verbal cueing Supine to Sit: Supervision/Verbal cueing Sit to Supine: Supervision/Verbal  cueing Transfers Sit to Stand: Minimal Assistance - Patient > 75%  Trunk/Postural Assessment  Cervical Assessment Cervical Assessment: Exceptions to WFL(forward head) Thoracic Assessment Thoracic Assessment: Exceptions to WFL(rounded shoulders) Lumbar Assessment Lumbar Assessment: Exceptions to WFL(posterior pelvic tilt) Postural Control Postural Control: Deficits on evaluation Righting Reactions: delayed  Balance Balance Balance Assessed: Yes Static Sitting Balance Static Sitting - Balance Support: No upper extremity supported;Feet supported Static Sitting - Level of Assistance: 5: Stand by assistance Dynamic Sitting Balance Dynamic Sitting - Balance Support: No upper extremity supported;Feet supported Dynamic Sitting - Level of Assistance: 5: Stand by assistance Static Standing Balance Static Standing - Balance Support: Bilateral upper extremity supported;During functional activity Static Standing - Level of Assistance: 4: Min assist Dynamic Standing Balance Dynamic Standing - Balance Support: Bilateral upper extremity supported;During functional activity Dynamic Standing - Level of Assistance: 4: Min assist Extremity/Trunk Assessment RUE Assessment RUE Assessment: Within Functional Limits LUE Assessment LUE Assessment: Within Functional Limits     Refer to Care Plan for Long Term Goals  Recommendations for other services: Neuropsych   Discharge Criteria: Patient will be discharged from OT if patient refuses treatment 3 consecutive times without medical reason, if treatment goals not met, if there is a change in medical status, if patient makes no progress towards goals or if patient is discharged from hospital.  The above assessment, treatment plan, treatment alternatives and goals were discussed and mutually agreed upon: by patient  Gypsy Decant 04/06/2018, 4:53 PM

## 2018-04-06 NOTE — Progress Notes (Signed)
Occupational Therapy Session Note  Patient Details  Name: Katherine [name] MRN: [id number] Date of Birth: 1960/12/20  Today's Date: 04/06/2018 OT Individual Time: 1300-1357 OT Individual Time Calculation (min): 57 min    Short Term Goals: Week 1:    STG=LTG due to LOS  Skilled Therapeutic Interventions/Progress Updates:    Pt seen for OT session focusing on education, functional mobility, and functional activity tolerance during ADL tasks. Pt with complaints of fatigue, denied pain, with encouragement willing to participate in session. Pt had not eaten lunch, reports dietician just left and had encouraged her to eat lunch. Pt agreeable to transitioning to EOB to eat lunch. While pt eating lunch, education/discussion regarding OT/PT goals, pt's goals, PLOF vs CLOF, CIR, importance of heart healthy diet, and d/c planning. Pt very motivated to return to PLOF and active lifestyle, reports lots of medical and family/personal set backs.  She ambulated throughout room with RW and CGA, VCs for RW management in functional context. Completed toileting task with steadying assist. She returned to sink to complete hand hygiene, heavy UE reliance on sink 2/2 fatigue. Following extended seated rest break, she completed hair care task in standing, following cues to limit UE reliance. Pt returned to sitting EOB with min A. Pt reports fatigue 7/10 following grooming tasks.  Pt left in supine at end of session with bed alarm on and all needs in reach.   Therapy Documentation Precautions:  Restrictions Weight Bearing Restrictions: No Pain:   No/denies pain   Therapy/Group: Individual Therapy  Genna Casimir L 04/06/2018, 7:05 AM

## 2018-04-07 ENCOUNTER — Inpatient Hospital Stay (HOSPITAL_COMMUNITY): Payer: Medicaid Other | Admitting: Occupational Therapy

## 2018-04-07 ENCOUNTER — Inpatient Hospital Stay (HOSPITAL_COMMUNITY): Payer: Medicaid Other | Admitting: Physical Therapy

## 2018-04-07 DIAGNOSIS — I5022 Chronic systolic (congestive) heart failure: Secondary | ICD-10-CM

## 2018-04-07 LAB — BASIC METABOLIC PANEL
Anion gap: 12 (ref 5–15)
BUN: 26 mg/dL — ABNORMAL HIGH (ref 6–20)
CO2: 25 mmol/L (ref 22–32)
Calcium: 9.4 mg/dL (ref 8.9–10.3)
Chloride: 100 mmol/L (ref 98–111)
Creatinine, Ser: 2.03 mg/dL — ABNORMAL HIGH (ref 0.44–1.00)
GFR calc Af Amer: 31 mL/min — ABNORMAL LOW (ref >60)
GFR calc non Af Amer: 27 mL/min — ABNORMAL LOW (ref >60)
GLUCOSE: 113 mg/dL — AB (ref 70–99)
Potassium: 3.6 mmol/L (ref 3.5–5.1)
Sodium: 137 mmol/L (ref 135–145)

## 2018-04-07 LAB — GLUCOSE, CAPILLARY
Glucose-Capillary: 92 mg/dL (ref 70–99)
Glucose-Capillary: 126 mg/dL — ABNORMAL HIGH (ref 70–99)
Glucose-Capillary: 103 mg/dL — ABNORMAL HIGH (ref 70–99)
Glucose-Capillary: 145 mg/dL — ABNORMAL HIGH (ref 70–99)

## 2018-04-07 NOTE — Progress Notes (Signed)
Patient ID: Katherine [name], female   DOB: 10-06-1960, 58 y.o.   MRN: [id number]     Advanced Heart Failure Rounding Note  PCP-Cardiologist: Lewayne Bunting, MD   Subjective:    Cr 2.06 -> 1.98 -> 2.19 -> 2.03  No dyspnea, rehab going well.    Objective:   Weight Range: 97.5 kg Body mass index is 29.15 kg/m.   Vital Signs: Temp:  [97.7 F (36.5 C)-98 F (36.7 C)] 97.7 F (36.5 C) (03/19 0505) Pulse Rate:  [80-87] 87 (03/19 0505) Resp:  [15-18] 16 (03/19 0505) BP: (99-120)/(65-84) 120/84 (03/19 0505) SpO2:  [96 %-99 %] 97 % (03/19 0721) Last BM Date: 04/07/18  Weight change: Filed Weights   04/05/18 1623 04/06/18 0542  Weight: 101.4 kg 97.5 kg   Intake/Output:   Intake/Output Summary (Last 24 hours) at 04/07/2018 1048 Last data filed at 04/07/2018 0900 Gross per 24 hour  Intake 480 ml  Output -  Net 480 ml    Physical Exam    General: NAD Neck: No JVD, no thyromegaly or thyroid nodule.  Lungs: Clear to auscultation bilaterally with normal respiratory effort. CV: Nondisplaced PMI.  Heart regular S1/S2, no S3/S4, no murmur.  Trace ankle edema.  Abdomen: Soft, nontender, no hepatosplenomegaly, no distention.  Skin: Intact without lesions or rashes.  Neurologic: Alert and oriented x 3.  Psych: Normal affect. Extremities: No clubbing or cyanosis.  HEENT: Normal.    Telemetry   Not connected (CIR)  EKG    No new tracings.    Labs    CBC Recent Labs    04/05/18 0427  WBC 7.9  NEUTROABS 3.6  HGB 13.3  HCT 40.3  MCV 92.4  PLT 483*   Basic Metabolic Panel Recent Labs    16/60/63 0913 04/07/18 0732  NA 134* 137  K 4.1 3.6  CL 100 100  CO2 23 25  GLUCOSE 172* 113*  BUN 23* 26*  CREATININE 2.09* 2.03*  CALCIUM 9.3 9.4   Liver Function Tests No results for input(s): AST, ALT, ALKPHOS, BILITOT, PROT, ALBUMIN in the last 72 hours. No results for input(s): LIPASE, AMYLASE in the last 72 hours. Cardiac Enzymes No results for input(s): CKTOTAL,  CKMB, CKMBINDEX, TROPONINI in the last 72 hours.  BNP: BNP (last 3 results) Recent Labs    01/12/18 0410 03/19/18 0702 03/24/18 1752  BNP 2,922.0* >4,500.0* 893.2*   ProBNP (last 3 results) No results for input(s): PROBNP in the last 8760 hours.  D-Dimer No results for input(s): DDIMER in the last 72 hours. Hemoglobin A1C No results for input(s): HGBA1C in the last 72 hours. Fasting Lipid Panel No results for input(s): CHOL, HDL, LDLCALC, TRIG, CHOLHDL, LDLDIRECT in the last 72 hours. Thyroid Function Tests No results for input(s): TSH, T4TOTAL, T3FREE, THYROIDAB in the last 72 hours.  Invalid input(s): FREET3  Other results:  Imaging   No results found.  Medications:    Scheduled Medications: . amiodarone  200 mg Oral BID  . clopidogrel  75 mg Oral Daily  . colchicine  0.6 mg Oral Daily  . digoxin  0.0625 mg Oral Daily  . docusate sodium  100 mg Oral BID  . enoxaparin (LOVENOX) injection  40 mg Subcutaneous Q24H  . feeding supplement (PRO-STAT SUGAR FREE 64)  30 mL Oral BID  . hydroxychloroquine  200 mg Oral BID  . insulin aspart  0-5 Units Subcutaneous QHS  . insulin aspart  0-9 Units Subcutaneous TID WC  . isosorbide-hydrALAZINE  1 tablet  Oral TID  . loratadine  10 mg Oral Daily  . mometasone-formoterol  2 puff Inhalation BID  . multivitamin with minerals  1 tablet Oral Daily  . ranolazine  500 mg Oral BID  . spironolactone  25 mg Oral Daily  . torsemide  80 mg Oral Daily  . venlafaxine XR  150 mg Oral Q breakfast    Infusions:   PRN Medications: acetaminophen, alum & mag hydroxide-simeth, diphenhydrAMINE, guaiFENesin-dextromethorphan, prochlorperazine **OR** prochlorperazine **OR** prochlorperazine, traZODone  Assessment/Plan   1. Acute on chronic systolic CHF: Nonischemic cardiomyopathy. Echo in 12/19 with EF 20-25%, mildly decreased RV systolic function. Medtronic ICD. She had been at [facility] with CHF exacerbation and VT, creatinine rose  considerably with attempts at diuresis. Lactate was elevated. Initial co-ox 38%. SBP was low at times in the 80s-90s.  Dobutamine was started at 4 mcg/kg/min and she was diuresed with IV Lasix. Weight is down considerably and she is now on po diuretic and off dobutamine. Volume status OK on exam with po diuretic, creatinine stable.  - Continue torsemide 80 mg daily and follow renal function closely.  - Continue spironolactone 25 mg daily  - Continue low dose digoxin. - Continue Bidil 1 tab tid. BP stable, but will not increase yet.  - Probably not a good LVAD candidate with poor mobility and renal dysfunction. However, could consider if needed if she makes progress PT.  2. AKI: Creatinine up to peak of 3.17 from 1.11. Suspect cardiorenal in setting of low cardiac output. She was admitted markedly volume overloaded. Creatinine now stable at 2.05. 3. VT: Patient had 17 episodes of VT with ICD discharges since 1/20. Suspect that CHF was trigger of VT. Has been quiescent in hospital. No change. - Continue amiodarone 200 mg BID for now. Has a tremor that may be amiodarone-related.   - Continue ranolazine.  - No BB with low output.  4. H/o CVA: On Plavix. No change.  5. H/o SLE: On hydroxychloroquine.No change.  6. UTI: Completed cipro. No change.  7. Gout: Suspect flare in shoulders.  - Continue colchicine - Finished 3 days prednisone. Resolved  Medication concerns reviewed with patient and pharmacy team. Barriers identified: None at this time.  Length of Stay: 2  Marca Ancona, MD  04/07/2018, 10:48 AM  Advanced Heart Failure Team Pager [phone] (M-F; 7a - 4p)  Please contact [facility] for night-coverage after hours (4p -7a ) and weekends on [email or web address]

## 2018-04-07 NOTE — Progress Notes (Signed)
[facility] PHYSICAL MEDICINE & REHABILITATION PROGRESS NOTE   Subjective/Complaints: Pt up in chair eating breakast. Sleeping well. Pain improved. Breathing better also.   ROS: Patient denies fever, rash, sore throat, blurred vision, nausea, vomiting, diarrhea, cough, shortness of breath or chest pain, joint or back pain, headache, or mood change.    Objective:   No results found. Recent Labs    04/05/18 0427  WBC 7.9  HGB 13.3  HCT 40.3  PLT 483*   Recent Labs    04/06/18 0913 04/07/18 0732  NA 134* 137  K 4.1 3.6  CL 100 100  CO2 23 25  GLUCOSE 172* 113*  BUN 23* 26*  CREATININE 2.09* 2.03*  CALCIUM 9.3 9.4    Intake/Output Summary (Last 24 hours) at 04/07/2018 1206 Last data filed at 04/07/2018 0900 Gross per 24 hour  Intake 480 ml  Output -  Net 480 ml     Physical Exam: Vital Signs Blood pressure 120/84, pulse 87, temperature 97.7 F (36.5 C), temperature source Oral, resp. rate 16, height 6' (1.829 m), weight 97.5 kg, last menstrual period 01/30/2011, SpO2 97 %. Constitutional: No distress . Vital signs reviewed. HEENT: EOMI, oral membranes moist Neck: supple Cardiovascular: RRR without murmur. No JVD    Respiratory: CTA Bilaterally without wheezes or rales. Normal effort    GI: BS +, non-tender, non-distended  Musculoskeletal:Normal range of motion.  General: No deformity.  Comments: BLE with tr to 1+ pedal edema and unna boots in place. Mild pain with ABD/ER/IR left shoulder is improving Neurological: She is alertand oriented to person, place, and time. Speech clear. Able to follow basic commands without difficulty. RUE 5/5. LUE 4+/5. RLE 3-4/5 prox to distal. LLE 3- to 4/5 prox to distal. No focal sensory loss, cognitively intact---stable exam Skin: Multiple healed lesions on BUE. Bilateral knees with scabbed abrasions with improvement Psychiatric: pleasant and appropriate.    Assessment/Plan: 1. Functional deficits secondary to  debility which require 3+ hours per day of interdisciplinary therapy in a comprehensive inpatient rehab setting.  Physiatrist is providing close team supervision and 24 hour management of active medical problems listed below.  Physiatrist and rehab team continue to assess barriers to discharge/monitor patient progress toward functional and medical goals  Care Tool:  Bathing    Body parts bathed by patient: Right arm, Left arm, Chest, Abdomen, Front perineal area, Right upper leg, Left upper leg, Face, Buttocks   Body parts bathed by helper: Buttocks Body parts n/a: Right lower leg, Left lower leg   Bathing assist Assist Level: Supervision/Verbal cueing     Upper Body Dressing/Undressing Upper body dressing   What is the patient wearing?: Pull over shirt    Upper body assist Assist Level: Contact Guard/Touching assist    Lower Body Dressing/Undressing Lower body dressing      What is the patient wearing?: Underwear/pull up, Pants     Lower body assist Assist for lower body dressing: Minimal Assistance - Patient > 75%     Toileting Toileting    Toileting assist Assist for toileting: Contact Guard/Touching assist     Transfers Chair/bed transfer  Transfers assist     Chair/bed transfer assist level: Contact Guard/Touching assist     Locomotion Ambulation   Ambulation assist      Assist level: Minimal Assistance - Patient > 75% Assistive device: Walker-rolling Max distance: 180'   Walk 10 feet activity   Assist     Assist level: Minimal Assistance - Patient > 75% Assistive  device: Walker-rolling   Walk 50 feet activity   Assist    Assist level: Minimal Assistance - Patient > 75% Assistive device: Walker-rolling    Walk 150 feet activity   Assist    Assist level: Minimal Assistance - Patient > 75% Assistive device: Walker-rolling    Walk 10 feet on uneven surface  activity   Assist Walk 10 feet on uneven surfaces activity did not  occur: Safety/medical concerns         Wheelchair     Assist Will patient use wheelchair at discharge?: Yes Type of Wheelchair: Manual    Wheelchair assist level: Supervision/Verbal cueing Max wheelchair distance: 66'    Wheelchair 50 feet with 2 turns activity    Assist        Assist Level: Supervision/Verbal cueing   Wheelchair 150 feet activity     Assist Wheelchair 150 feet activity did not occur: Safety/medical concerns        Medical Problem List and Plan: 1.Functional and mobility deficitssecondary to congestive heart failure and multiple medical issues --Continue CIR therapies including PT, OT  2. Antithrombotics: -DVT/anticoagulation:Pharmaceutical:Lovenox -antiplatelet therapy: On Plavix daily. 3.Fibromyalgia/Lupus/Pain Management:Tylenol prn. 4. Mood:LCSW to follow for evaluation and support. -antipsychotic agents: N/A 5. Neuropsych: This patientiscapable of making decisions onherown behalf. 6. Skin/Wound Care:Unna boots replaced 3/17. Added ensure max to help with protein stores/ wound healing. 7. Fluids/Electrolytes/Nutrition:Monitor I/O.   Continue 2000cc/FR.   -I personally reviewed the patient's labs today.   8. NICM/VT s/p ICD: On Ranolazine. Reported to have 17 ICD discharges since 01/2018. Amiodarone decreased to 200 mg bid due to tremors. BB on hold due to low output.   -HR controlled 9. SLE: controlled on hydroxychloroquine.  10. Acute renal failure: Serum Creatinine trending back up.    -Cr 2.03 today, holding steady this week at what appears to be baseline 11. Gout flare: Treated with 3 day course ofprednisone. Continuecolchicine.  12. Acute on chronic systolic CHF: Likely exacerbated by VT.    -Continue spironolactone, digoxin and bidil.   -Torsemide 20 mg resumed  -needs weight today Filed Weights   04/05/18 1623 04/06/18 0542  Weight: 101.4 kg 97.5 kg    13.  Hypokalemia: Resolved with intermittent supplement.   -follow up labs as available.  14. T2DM: Hgb A1C- 6.8. Continue to Monitor ac/hs --remains controlled on SSI  -PO intake is picking up without significant elevation yet    -resume 50/50 insulin as indicated.     LOS: 2 days A FACE TO FACE EVALUATION WAS PERFORMED  Katherine [name] 04/07/2018, 12:06 PM

## 2018-04-07 NOTE — Progress Notes (Signed)
Occupational Therapy Session Note  Patient Details  Name: Katherine [name] MRN: [id number] Date of Birth: [birth year]  Today's Date: 04/07/2018 OT Individual Time: 3329-5188 OT Individual Time Calculation (min): 72 min    Short Term Goals: Week 1:  OT Short Term Goal 1 (Week 1): STGs=LTGs secondary to short estimated LOS  Skilled Therapeutic Interventions/Progress Updates:    Pt seen for OT ADL bathing/dressing session. Pt sitting up in recliner upon arrival, reports feeling good and agreeable to tx session. She ambulated throughout session with CGA using RW. VCs during donning/doffing of clothes for safety awareness, completing dynamic tasks in sitting when able, I.e. stepping out of pants and doffing shirt, as well as VCs for RW management in functional context. B LEs wrapped in bags prior to shower. She bathed seated on tub transfer bench with distant supervision and set-up. She returned to w/c to dress, increased time and intermittent rest breaks required. Min A UB dressing and CGA when stanind at RW to pull pants up. Following seated rest break, she completed grooming tasks standing at sink with supervision. Pt taken to ADL apartment total A in w/c for time and energy conservation. Educated regarding technique for shower stall transfer using RW, demonstration provided. Pt return demonstrated with guarding assist to [facility] as simulated shower seat. Pt reports she has multiple grab bars in home shower. Pt returned to room in w/c total A. Ambulated ~32ft within room to return to recliner with close supervision. Pt left sitting up in recliner at end of session, all needs in reach and chair pad alarm on.   Therapy Documentation Precautions:  Precautions Precautions: Fall Precaution Comments: ICD Restrictions Weight Bearing Restrictions: No Pain:   No/denies pain   Therapy/Group: Individual Therapy  Aliyah Abeyta L 04/07/2018, 7:10 AM

## 2018-04-07 NOTE — Progress Notes (Signed)
Physical Therapy Session Note  Patient Details  Name: Katherine [name] MRN: [id number] Date of Birth: 05-25-1960  Today's Date: 04/07/2018 PT Individual Time: 1030-1130; 1300-1400 PT Individual Time Calculation (min): 60 min and 60 min  Short Term Goals: Week 1:  PT Short Term Goal 1 (Week 1): =LTG due to ELOS  Skilled Therapeutic Interventions/Progress Updates:    Session 1: Pt received seated in recliner in room, agreeable to PT session. No complaints of pain. Sit to stand with CGA. Ambulation to bathroom with RW and CGA. Toilet transfer with mod A due to low toilet seat height. Pt reports her toilet seat height at home is taller. Ambulation x 200 ft with RW and CGA, pt exhibits improved endurance for gait this date. Ascend/descend 4 stairs with R handrail forwards with min to mod A, laterally with BUE support on R handrail and min A. Education with patient about performing stairs laterally at home for improved safety and support from rail. Nustep level 3 x 10 min with two seated rest breaks with use of B UE/LE for global endurance training. Pt reports significant fatigue at end of session. Pt left seated in recliner in room with needs in reach, chair alarm in place at end of session.  Session 2: Pt received seated in recliner in room, agreeable to PT session. No complaints of pain. Sit to stand with CGA to RW. Stand pivot transfer recliner to bed with RW and CGA. Sit to supine Supervision. Provided pt with handout for BLE strengthening exercises in supine, sitting, and standing. Reviewed supine therex during this session: SLR, hip abd, hip add squeeze, SAQ, and quad sets x 10 reps each. Pt requests to change into a gown at end of session. Pt is setup A for changing out of shirt and pants into gown. Pt left supine in bed with needs in reach and bed alarm in place.   Therapy Documentation Precautions:  Precautions Precautions: Fall Precaution Comments: ICD Restrictions Weight Bearing  Restrictions: No Pain: Pain Assessment Pain Scale: 0-10 Pain Score: 0-No pain    Therapy/Group: Individual Therapy   Peter Congo, PT, DPT  04/07/2018, 12:08 PM

## 2018-04-08 ENCOUNTER — Inpatient Hospital Stay (HOSPITAL_COMMUNITY): Payer: Medicaid Other | Admitting: Occupational Therapy

## 2018-04-08 ENCOUNTER — Inpatient Hospital Stay (HOSPITAL_COMMUNITY): Payer: Medicaid Other | Admitting: Physical Therapy

## 2018-04-08 LAB — BASIC METABOLIC PANEL
Anion gap: 14 (ref 5–15)
BUN: 31 mg/dL — ABNORMAL HIGH (ref 6–20)
CHLORIDE: 101 mmol/L (ref 98–111)
CO2: 22 mmol/L (ref 22–32)
Calcium: 9.1 mg/dL (ref 8.9–10.3)
Creatinine, Ser: 2.08 mg/dL — ABNORMAL HIGH (ref 0.44–1.00)
GFR calc Af Amer: 30 mL/min — ABNORMAL LOW (ref >60)
GFR calc non Af Amer: 26 mL/min — ABNORMAL LOW (ref >60)
Glucose, Bld: 113 mg/dL — ABNORMAL HIGH (ref 70–99)
Potassium: 3.4 mmol/L — ABNORMAL LOW (ref 3.5–5.1)
Sodium: 137 mmol/L (ref 135–145)

## 2018-04-08 LAB — GLUCOSE, CAPILLARY
GLUCOSE-CAPILLARY: 87 mg/dL (ref 70–99)
Glucose-Capillary: 109 mg/dL — ABNORMAL HIGH (ref 70–99)
Glucose-Capillary: 116 mg/dL — ABNORMAL HIGH (ref 70–99)
Glucose-Capillary: 92 mg/dL (ref 70–99)

## 2018-04-08 MED ORDER — POTASSIUM CHLORIDE CRYS ER 20 MEQ PO TBCR
20.0000 meq | EXTENDED_RELEASE_TABLET | Freq: Every day | ORAL | Status: DC
  Administered 2018-04-08 – 2018-04-12 (×5): 20 meq via ORAL
  Filled 2018-04-08 (×5): qty 1

## 2018-04-08 NOTE — Progress Notes (Signed)
Physical Therapy Session Note  Patient Details  Name: Katherine [name] MRN: [id number] Date of Birth: [birth year]  Today's Date: 04/08/2018 PT Individual Time: 1455-1540 PT Individual Time Calculation (min): 45 min   Short Term Goals: Week 1:  PT Short Term Goal 1 (Week 1): =LTG due to ELOS  Skilled Therapeutic Interventions/Progress Updates:    Pt received seated in recliner in room, agreeable to PT session. No complaints of pain but pt does have a bloody nose intermittently throughout session. Sit to stand with Supervision to 4WW. Ambulation 2 x 150 ft with 4WW and Supervision, one seated rest break on 4WW between each bout of ambulation. Pt requires min v/c for safe brake management. Pt's nose blood continues so returned to patient's room and pt in semi-reclined position in bed. RN aware of nose bleed. Supine BLE therex x 10 reps: SLR, hip abd, heel slides. Pt left seated in bed with needs in reach, bed alarm in place.  Therapy Documentation Precautions:  Precautions Precautions: Fall Precaution Comments: ICD Restrictions Weight Bearing Restrictions: No    Therapy/Group: Individual Therapy   Peter Congo, PT, DPT  04/08/2018, 3:51 PM

## 2018-04-08 NOTE — IPOC Note (Signed)
Overall Plan of Care [facility]) Patient Details Name: Katherine ARTERS MRN: [id number] DOB: [birth year]  Admitting Diagnosis: <principal problem not specified>  Hospital Problems: Active Problems:   Chronic systolic CHF (congestive heart failure) (HCC)   Debility     Functional Problem List: Nursing Endurance, Medication Management, Motor, Sensory, Pain  PT Balance, Endurance, Safety, Sensory  OT Balance, Endurance, Motor, Safety  SLP    TR         Basic ADL's: OT Grooming, Bathing, Dressing, Toileting     Advanced  ADL's: OT Laundry     Transfers: PT Bed Mobility, Bed to Chair, Car, [employer], Civil Service fast streamer, Research scientist (life sciences): PT Ambulation, Psychologist, prison and probation services, Stairs     Additional Impairments: OT None  SLP        TR      Anticipated Outcomes Item Anticipated Outcome  Self Feeding n/a  Swallowing      Basic self-care  S  Toileting  S   Bathroom Transfers S  Bowel/Bladder  Pt will manage bowel and bladder with min assist   Transfers  Supervision  Locomotion  Supervision with LRAD  Communication     Cognition     Pain  Pt will manage pain at 3 or less on a scale of 0-10.   Safety/Judgment  Pt will remain free of falls with injury while in rehab with min cues.    Therapy Plan: PT Intensity: Minimum of 1-2 x/day ,45 to 90 minutes PT Frequency: 5 out of 7 days PT Duration Estimated Length of Stay: 5-7 days OT Intensity: Minimum of 1-2 x/day, 45 to 90 minutes OT Frequency: 5 out of 7 days OT Duration/Estimated Length of Stay: 5-7 days      Team Interventions: Nursing Interventions Patient/Family Education, Pain Management, Disease Management/Prevention, Discharge Planning, Medication Management  PT interventions Ambulation/gait training, Warden/ranger, Community reintegration, Discharge planning, Disease management/prevention, DME/adaptive equipment instruction, Functional mobility training, Neuromuscular re-education,  Pain management, Patient/family education, Psychosocial support, Stair training, Therapeutic Activities, Therapeutic Exercise, UE/LE Strength taining/ROM, UE/LE Coordination activities, Visual/perceptual remediation/compensation, Wheelchair propulsion/positioning  OT Interventions Warden/ranger, Self Care/advanced ADL retraining, Therapeutic Exercise, Wheelchair propulsion/positioning, DME/adaptive equipment instruction, Pain management, UE/LE Strength taining/ROM, Community reintegration, Development worker, international aid stimulation, Patient/family education, UE/LE Coordination activities, Discharge planning, Functional mobility training, Psychosocial support, Therapeutic Activities  SLP Interventions    TR Interventions    SW/CM Interventions Discharge Planning, Psychosocial Support, Patient/Family Education   Barriers to Discharge MD  Medical stability  Nursing      PT Medical stability, Home environment access/layout    OT Other (comments) none known at this time  SLP      SW       Team Discharge Planning: Destination: PT-Home ,OT- Home , SLP-  Projected Follow-up: PT-Home health PT, OT-  24 hour supervision/assistance, Home health OT, SLP-  Projected Equipment Needs: PT-Rolling walker with 5" wheels, OT- To be determined, SLP-  Equipment Details: PT-pt already owns RW and manual w/c, OT-  Patient/family involved in discharge planning: PT- Patient,  OT-Patient, SLP-   MD ELOS: 5-7 days Medical Rehab Prognosis:  Excellent Assessment: The patient has been admitted for CIR therapies with the diagnosis of debility related to CHF and multiple medical. The team will be addressing functional mobility, strength, stamina, balance, safety, adaptive techniques and equipment, self-care, bowel and bladder mgt, patient and caregiver education, activity tolerance, community reentry. Goals have been set at supervision for mobility and self-care tasks.    Lamonte Richer.  Riley Kill, MD, [facility]       See Team Conference Notes for weekly updates to the plan of care

## 2018-04-08 NOTE — Progress Notes (Addendum)
Patient ID: Katherine [name], female   DOB: January 26, 1960, 58 y.o.   MRN: [id number]     Advanced Heart Failure Rounding Note  PCP-Cardiologist: Lewayne Bunting, MD   Subjective:    Cr 2.06 -> 1.98 -> 2.19 -> 2.03 -> 2.08. K 3.4.  Feeling good today. Continues to work with PT/OT. Tentatively home beginning of next week. Denies SOB or CP.  Objective:   Weight Range: 98.4 kg Body mass index is 29.42 kg/m.   Vital Signs: Temp:  [97.8 F (36.6 C)-98.2 F (36.8 C)] 97.8 F (36.6 C) (03/20 0528) Pulse Rate:  [80-86] 86 (03/20 0528) Resp:  [16] 16 (03/20 0528) BP: (115-124)/(74-79) 124/74 (03/20 0528) SpO2:  [99 %-100 %] 99 % (03/20 0528) Weight:  [98.4 kg] 98.4 kg (03/20 0528) Last BM Date: 04/07/18  Weight change: Filed Weights   04/05/18 1623 04/06/18 0542 04/08/18 0528  Weight: 101.4 kg 97.5 kg 98.4 kg   Intake/Output:   Intake/Output Summary (Last 24 hours) at 04/08/2018 0841 Last data filed at 04/07/2018 1300 Gross per 24 hour  Intake 480 ml  Output -  Net 480 ml    Physical Exam    General: NAD HEENT: Normal Neck: Supple. JVP not elevated. Carotids 2+ bilat; no bruits. No thyromegaly or nodule noted. Cor: PMI nondisplaced. RRR, No M/G/R noted Lungs: CTAB, normal effort. Abdomen: Soft, non-tender, non-distended, no HSM. No bruits or masses. +BS  Extremities: No cyanosis, clubbing, or rash. R and LLE no edema.  Neuro: Alert & orientedx3, cranial nerves grossly intact. moves all 4 extremities w/o difficulty. Affect pleasant   Telemetry   Not connected (CIR)  EKG    No new tracings.    Labs    CBC No results for input(s): WBC, NEUTROABS, HGB, HCT, MCV, PLT in the last 72 hours. Basic Metabolic Panel Recent Labs    51/70/01 0732 04/08/18 0509  NA 137 137  K 3.6 3.4*  CL 100 101  CO2 25 22  GLUCOSE 113* 113*  BUN 26* 31*  CREATININE 2.03* 2.08*  CALCIUM 9.4 9.1   Liver Function Tests No results for input(s): AST, ALT, ALKPHOS, BILITOT, PROT,  ALBUMIN in the last 72 hours. No results for input(s): LIPASE, AMYLASE in the last 72 hours. Cardiac Enzymes No results for input(s): CKTOTAL, CKMB, CKMBINDEX, TROPONINI in the last 72 hours.  BNP: BNP (last 3 results) Recent Labs    01/12/18 0410 03/19/18 0702 03/24/18 1752  BNP 2,922.0* >4,500.0* 893.2*   ProBNP (last 3 results) No results for input(s): PROBNP in the last 8760 hours.  D-Dimer No results for input(s): DDIMER in the last 72 hours. Hemoglobin A1C No results for input(s): HGBA1C in the last 72 hours. Fasting Lipid Panel No results for input(s): CHOL, HDL, LDLCALC, TRIG, CHOLHDL, LDLDIRECT in the last 72 hours. Thyroid Function Tests No results for input(s): TSH, T4TOTAL, T3FREE, THYROIDAB in the last 72 hours.  Invalid input(s): FREET3  Other results:  Imaging   No results found.  Medications:    Scheduled Medications: . amiodarone  200 mg Oral BID  . clopidogrel  75 mg Oral Daily  . colchicine  0.6 mg Oral Daily  . digoxin  0.0625 mg Oral Daily  . docusate sodium  100 mg Oral BID  . enoxaparin (LOVENOX) injection  40 mg Subcutaneous Q24H  . feeding supplement (PRO-STAT SUGAR FREE 64)  30 mL Oral BID  . hydroxychloroquine  200 mg Oral BID  . insulin aspart  0-5 Units Subcutaneous QHS  .  insulin aspart  0-9 Units Subcutaneous TID WC  . isosorbide-hydrALAZINE  1 tablet Oral TID  . loratadine  10 mg Oral Daily  . mometasone-formoterol  2 puff Inhalation BID  . multivitamin with minerals  1 tablet Oral Daily  . ranolazine  500 mg Oral BID  . spironolactone  25 mg Oral Daily  . torsemide  80 mg Oral Daily  . venlafaxine XR  150 mg Oral Q breakfast    Infusions:   PRN Medications: acetaminophen, alum & mag hydroxide-simeth, diphenhydrAMINE, guaiFENesin-dextromethorphan, prochlorperazine **OR** prochlorperazine **OR** prochlorperazine, traZODone  Assessment/Plan   1. Acute on chronic systolic CHF: Nonischemic cardiomyopathy. Echo in 12/19 with  EF 20-25%, mildly decreased RV systolic function. [employer] ICD. She had been at [facility] with CHF exacerbation and VT, creatinine rose considerably with attempts at diuresis. Lactate was elevated. Initial co-ox 38%. SBP was low at times in the 80s-90s.  Dobutamine was started at 4 mcg/kg/min and she was diuresed with IV Lasix. Weight is down considerably and she is now on po diuretic and off dobutamine. Volume status stable on po diuretics and creatinine stable.  - Continue torsemide 80 mg daily for now and continue to follow renal function closely.  - Continue spironolactone 25 mg daily  - Continue low dose digoxin. - Continue Bidil 1 tab tid. BP stable, but will not increase yet.  - Not a good LVAD candidate with poor mobility and renal dysfunction. 2. AKI: Creatinine up to peak of 3.17 from 1.11. Suspect cardiorenal in setting of low cardiac output. She was admitted markedly volume overloaded. Creatinine now stable at 2.05 -> 2.08. 3. VT: Patient had 17 episodes of VT with ICD discharges since 1/20. Suspect that CHF was trigger of VT. Has been quiescent in hospital. No change.  - Continue amiodarone 200 mg BID for now. Has a tremor that may be amiodarone-related.   - Continue ranolazine.  - No BB with low output.  4. H/o CVA: On Plavix. No change.  5. H/o SLE: On hydroxychloroquine.No change.  6. UTI: Completed cipro. No change.  7. Gout: Suspect flare in shoulders.  - Resolved with 3 days prednisone.  - Continue colchicine 8. Hypokalemia  Medication concerns reviewed with patient and pharmacy team. Barriers identified: None at this time.  Length of Stay: 3  Luane School  04/08/2018, 8:41 AM  Advanced Heart Failure Team Pager [phone] (M-F; 7a - 4p)  Please contact [facility] for night-coverage after hours (4p -7a ) and weekends on [email or web address]  Patient seen with PA, agree with the above note.   She is stable from a cardiac standpoint today.  Volume status looks  ok, creatinine stable.   Continue current cardiac meds without changes. Continue to follow BMET daily.  We will see again on Monday unless called.   Marca Ancona 04/08/2018 10:23 AM

## 2018-04-08 NOTE — Progress Notes (Signed)
Occupational Therapy Session Note  Patient Details  Name: Katherine [name] MRN: [id number] Date of Birth: June 21, 1960  Today's Date: 04/08/2018 OT Individual Time: 0845-1000 and 1345-1500 OT Individual Time Calculation (min): 75 min and 75 min    Short Term Goals: Week 1:  OT Short Term Goal 1 (Week 1): STGs=LTGs secondary to short estimated LOS  Skilled Therapeutic Interventions/Progress Updates:    Session One: Pt seen for OT ADL bathing/dressing session. Pt sitting up in recliner upon arrival, complaints of stomach pain and requesting to go to bathroom. Ambulated with RW and supervision into bathroom, min VCs for RW management in functional context. She completed toileting task with supervision, +BM and reported decreased in stomach pain. Pt provided with walker bag and able to gather clothing items from drawer with supervision and transport with use of walker bag. She bathed seated on tub transfer bench with intermittent supervision and set-up.  She returned to w/c to dress, supervision overall, standing with RW to pull pants up. Pt requiring seated rest breaks throughout ADL tasks 2/2 decreased functional activity tolerance. She stood at sink to complete grooming tasks with supervision.  Discussed pt's DME at home, pt reports using rollator PTA. Discussed with PT and trialed use of rollator for remainder of session. She ambulated with rollator and supervision to pt laundry facility and completed laundry task of placing items in top loading washer with supervision and VCs for management in functional context. She returned to room, one standing rest break required. Pt left in recliner at end of session with chair pad alarm on and all needs in reach. Pt becoming diaphoretic at end of session, pt reports she sweats a lot at home and has no concerns. RN made aware to check in on pt following rest break at end of therapy session.  Education provided throughout session regarding OT/PT goals, DME,  energy conservation, continuum of care, and d/c planning.   Session Two: Pt seen for OT session focusing on functional mobility and IADL re-training. Pt sitting up in recliner upon arrival, complaints of increased fatigue, however, willing to participate in therapy as able, denied pain.  She donned shoes seated in recliner independently. She ambulated throughout room with rollator and supervision. She required VCs throughout session for rollator management in functional context and management of brakes. Completed toileting task  Followed by hand hygiene with supervision. She ambulated throughout unit to pt laundry facility and gathered laundry from front loading dryer with supervision. She returned to room to fold laundry. Following seated rest break, pt stood to fold laundry and placed items in drawers and closet with supervision/VCs.  Pt taken outside total A in w/c for time and energy conservation. Completed functional ambulation outside over uneven terrain x2 trials. First trial using rollator with supervision. Following seated rest break, ambulated with min HHA. Pt very excited to be able to walk without AD. Throughout session and rest breaks, pt educated regarding continuum of care, energy conservation, return to activity and d/c planning.  Pt returned to unit in w/c. Transitioned to recliner. Pt with small nose bleed, able to manage independently and blood stopped before therapist exiting room. Pt left with all needs in reach and chair pad alarm on.   Therapy Documentation Precautions:  Precautions Precautions: Fall Precaution Comments: ICD Restrictions Weight Bearing Restrictions: No Pain:   No/denies pain   Therapy/Group: Individual Therapy  Katherine [name] L 04/08/2018, 6:56 AM

## 2018-04-08 NOTE — Progress Notes (Signed)
Social Work  Social Work Assessment and Plan  Patient Details  Name: Katherine [name] MRN: [id number] Date of Birth: 15-Aug-1960  Today's Date: 04/08/2018  Problem List:  Patient Active Problem List   Diagnosis Date Noted  . Debility 04/05/2018  . Hypokalemia   . History of CVA with residual deficit   . Diabetes mellitus type 2 in obese (HCC)   . Acute gout of shoulder   . Chronic systolic CHF (congestive heart failure) (HCC) 03/24/2018  . CHF (congestive heart failure) (HCC) 03/19/2018  . Morbid obesity due to excess calories (HCC) 09/24/2015  . Personal history of noncompliance with medical treatment, presenting hazards to health 02/19/2015  . Acute on chronic combined systolic and diastolic CHF (congestive heart failure) (HCC) 11/17/2012  . Non-ischemic cardiomyopathy (HCC) 10/03/2012  . AICD (automatic cardioverter/defibrillator) present 10/03/2012  . HTN (hypertension) 10/03/2012  . Diabetes mellitus with stage 3 chronic kidney disease (HCC) 10/03/2012  . Fibromyalgia 10/03/2012  . Dyslipidemia 10/03/2012  . Lupus (HCC) 10/03/2012  . Stroke [facility]) 10/03/2012   Past Medical History:  Past Medical History:  Diagnosis Date  . Anxiety   . Arthritis   . Asthma   . Bronchitis   . CHF (congestive heart failure) (HCC)    a. EF 20-25% by cath in 11/2010 with cath showing normal cors  . CKD (chronic kidney disease) stage 3, GFR 30-59 ml/min (HCC)   . Fibromyalgia   . GERD (gastroesophageal reflux disease)   . Hallux valgus of left foot   . Hay fever   . Headache(784.0)   . History of pneumonia   . History of seizures   . Hyperlipidemia   . Hypertension   . Nonischemic cardiomyopathy (HCC)   . Obesity   . Stroke (HCC)   . Systemic lupus erythematosus (HCC)   . Type 2 diabetes mellitus (HCC)    Past Surgical History:  Past Surgical History:  Procedure Laterality Date  . ICD  02/13/2011   Medtronic Protecta DR XT  . ICD GENERATOR CHANGEOUT N/A 01/07/2017    Procedure: ICD GENERATOR CHANGEOUT;  Surgeon: Marinus Maw, MD;  Location: [facility] INVASIVE CV LAB;  Service: Cardiovascular;  Laterality: N/A;  . IMPLANTABLE CARDIOVERTER DEFIBRILLATOR IMPLANT N/A 02/13/2011   Procedure: IMPLANTABLE CARDIOVERTER DEFIBRILLATOR IMPLANT;  Surgeon: Thurmon Fair, MD;  Location: [facility] CATH LAB;  Service: Cardiovascular;  Laterality: N/A;  . LEFT HEART CATHETERIZATION WITH CORONARY ANGIOGRAM N/A 12/05/2010   Procedure: LEFT HEART CATHETERIZATION WITH CORONARY ANGIOGRAM;  Surgeon: Chrystie Nose, MD;  Location: [facility] CATH LAB;  Service: Cardiovascular;  Laterality: N/A;  . RIGHT ANKLE    . TUBAL LIGATION    . US ECHOCARDIOGRAPHY  11/09/2010   mild LV enlargement w/conc. LVH & severe global hypokinesis EF 30-35%,mod. diastolic dysfunction, mod. TR,mild AI,mild to mod MR,mild PI   Social History:  reports that she quit smoking about 12 years ago. Her smoking use included cigarettes. She started smoking about 38 years ago. She has a 26.00 pack-year smoking history. She has never used smokeless tobacco. She reports previous alcohol use. She reports that she does not use drugs.  Family / Support Systems Marital Status: Single Patient Roles: Other (Comment)(has a boyfriend and 2 daughters) Spouse/Significant Other: boyfriend, Gabrielle Dare @ [phone] Children: 2 adult daughters Anticipated Caregiver: BF Ability/Limitations of Caregiver: Renae Fickle works as a Scientist, water quality.  Daughters work.  Has a son in law and a best friend who can assist. Caregiver Availability: 24/7 Family Dynamics: Pt notes very good support  from bf and daughters.  No concerns.  Social History Preferred language: English Religion: [employer] Cultural Background: NA Read: Yes Write: Yes Employment Status: Disabled Marine scientist Issues: None Guardian/Conservator: None - per MD, pt is capable to make decisions on her own behalf.   Abuse/Neglect Abuse/Neglect Assessment Can Be Completed:  Yes Physical Abuse: Denies Verbal Abuse: Denies Sexual Abuse: Denies Exploitation of patient/patient's resources: Denies Self-Neglect: Denies  Emotional Status Pt's affect, behavior and adjustment status: Pt very pleasant and talkative!  She is excited that team feels she will be ready for d/c early next week.  States that she has "learned a lot from everybody up here... a lot from the medical people, too..." in better management of her CHF.  Pt denies any significant emotional distress - will monitor. Recent Psychosocial Issues: None Psychiatric History: None Substance Abuse History: None  Patient / Family Perceptions, Expectations & Goals Pt/Family understanding of illness & functional limitations: Pt and family with good understanding of her medical decline and increased debility/ need for CIR. Premorbid pt/family roles/activities: Pt independent overall Anticipated changes in roles/activities/participation: Little change anticipated if pt able to reach supervision goals. Pt/family expectations/goals: "I just want to get home as soon as I can."  [employer]: Other (Comment)(Heart failure clinic) Premorbid Home Care/DME Agencies: Other (Comment)([facility]) Transportation available at discharge: yes Resource referrals recommended: Neuropsychology  Discharge Planning Living Arrangements: Spouse/significant other Support Systems: Spouse/significant other, Children, Other relatives, Friends/neighbors Type of Residence: Private residence Insurance Resources: [employer] (specify county) Surveyor, quantity Resources: SSI Financial Screen Referred: No Living Expenses: Psychologist, sport and exercise Management: Patient Does the patient have any problems obtaining your medications?: No Home Management: pt Patient/Family Preliminary Plans: Pt to return home with bf to stay and provide any needed assist. Social Work Anticipated Follow Up Needs: HH/OP Expected length of stay: 5-7 days  Clinical  Impression Very pleasant woman here for debility and making excellent progress and short LOS expected.  Good support from bf and daughter with plans for BF to stay with her upon d/c.  Denies any emotional distress - will monitor.  Jasraj Lappe 04/08/2018, 3:55 PM

## 2018-04-08 NOTE — Progress Notes (Signed)
PHYSICAL MEDICINE & REHABILITATION PROGRESS NOTE   Subjective/Complaints: Pt in chair. No new complaints. Happy with her progress  ROS: Patient denies fever, rash, sore throat, blurred vision, nausea, vomiting, diarrhea, cough, shortness of breath or chest pain, joint or back pain, headache, or mood change.     Objective:   No results found. No results for input(s): WBC, HGB, HCT, PLT in the last 72 hours. Recent Labs    04/07/18 0732 04/08/18 0509  NA 137 137  K 3.6 3.4*  CL 100 101  CO2 25 22  GLUCOSE 113* 113*  BUN 26* 31*  CREATININE 2.03* 2.08*  CALCIUM 9.4 9.1    Intake/Output Summary (Last 24 hours) at 04/08/2018 0858 Last data filed at 04/07/2018 1300 Gross per 24 hour  Intake 480 ml  Output -  Net 480 ml     Physical Exam: Vital Signs Blood pressure 124/74, pulse 86, temperature 97.8 F (36.6 C), resp. rate 16, height 6' (1.829 m), weight 98.4 kg, last menstrual period 01/30/2011, SpO2 99 %. Constitutional: No distress . Vital signs reviewed. HEENT: EOMI, oral membranes moist Neck: supple Cardiovascular: RRR without murmur. No JVD    Respiratory: CTA Bilaterally without wheezes or rales. Normal effort    GI: BS +, non-tender, non-distended   Musculoskeletal:Normal range of motion.  General: No deformity.  Comments: BLE with tr to 1+ pedal edema and unna boots in place. Mild pain with ABD/ER/IR left shoulder is improving Neurological: She is alertand oriented to person, place, and time. Speech clear. Able to follow basic commands without difficulty. RUE 5/5. LUE 4+/5. RLE 3-4/5 prox to distal. LLE 3- to 4/5 prox to distal. No focal sensory loss, cognitively intact---stable exam Skin: Scabs on BL LE Psychiatric: pleasant.    Assessment/Plan: 1. Functional deficits secondary to debility which require 3+ hours per day of interdisciplinary therapy in a comprehensive inpatient rehab setting.  Physiatrist is providing close team  supervision and 24 hour management of active medical problems listed below.  Physiatrist and rehab team continue to assess barriers to discharge/monitor patient progress toward functional and medical goals  Care Tool:  Bathing    Body parts bathed by patient: Right arm, Left arm, Chest, Abdomen, Front perineal area, Right upper leg, Left upper leg, Face, Buttocks   Body parts bathed by helper: Buttocks Body parts n/a: Right lower leg, Left lower leg   Bathing assist Assist Level: Supervision/Verbal cueing     Upper Body Dressing/Undressing Upper body dressing   What is the patient wearing?: Hospital gown only    Upper body assist Assist Level: Contact Guard/Touching assist    Lower Body Dressing/Undressing Lower body dressing      What is the patient wearing?: Underwear/pull up, Pants     Lower body assist Assist for lower body dressing: Minimal Assistance - Patient > 75%     Toileting Toileting    Toileting assist Assist for toileting: Supervision/Verbal cueing Assistive Device Comment: Front wheel walker   Transfers Chair/bed transfer  Transfers assist     Chair/bed transfer assist level: Supervision/Verbal cueing     Locomotion Ambulation   Ambulation assist      Assist level: Contact Guard/Touching assist Assistive device: Walker-rolling Max distance: 200'   Walk 10 feet activity   Assist     Assist level: Contact Guard/Touching assist Assistive device: Walker-rolling   Walk 50 feet activity   Assist    Assist level: Contact Guard/Touching assist Assistive device: Walker-rolling    Walk 150  feet activity   Assist    Assist level: Contact Guard/Touching assist Assistive device: Walker-rolling    Walk 10 feet on uneven surface  activity   Assist Walk 10 feet on uneven surfaces activity did not occur: Safety/medical concerns         Wheelchair     Assist Will patient use wheelchair at discharge?: Yes Type of  Wheelchair: Manual    Wheelchair assist level: Supervision/Verbal cueing Max wheelchair distance: 107'    Wheelchair 50 feet with 2 turns activity    Assist        Assist Level: Supervision/Verbal cueing   Wheelchair 150 feet activity     Assist Wheelchair 150 feet activity did not occur: Safety/medical concerns        Medical Problem List and Plan: 1.Functional and mobility deficitssecondary to congestive heart failure and multiple medical issues --Continue CIR therapies including PT, OT   2. Antithrombotics: -DVT/anticoagulation:Pharmaceutical:Lovenox -antiplatelet therapy: On Plavix daily. 3.Fibromyalgia/Lupus/Pain Management:Tylenol prn. 4. Mood:LCSW to follow for evaluation and support. -antipsychotic agents: N/A 5. Neuropsych: This patientiscapable of making decisions onherown behalf. 6. Skin/Wound Care:Unna boots replaced 3/17. Added ensure max to help with protein stores/ wound healing. 7. Fluids/Electrolytes/Nutrition:Monitor I/O.   Continue 2000cc/FR.   I personally reviewed the patient's labs today.   8. NICM/VT s/p ICD: On Ranolazine. Reported to have 17 ICD discharges since 01/2018. Amiodarone decreased to 200 mg bid due to tremors. BB on hold due to low output.   -HR controlled 9. SLE: controlled on hydroxychloroquine.  10. Acute renal failure: Serum Creatinine trending back up.    -Cr 2.08 today 3/20, holding steady this week at what appears to be baseline 11. Gout flare: Treated with 3 day course ofprednisone. Continuecolchicine.  12. Acute on chronic systolic CHF: Likely exacerbated by VT.    -Continue spironolactone, digoxin and bidil.   -Torsemide 20 mg resumed  -weights negative to stable Filed Weights   04/05/18 1623 04/06/18 0542 04/08/18 0528  Weight: 101.4 kg 97.5 kg 98.4 kg    13. Hypokalemia: 3.4  -continue supplementing  14. T2DM: Hgb A1C- 6.8. Continue to Monitor ac/hs  --remains controlled on SSI  -PO intake is 100% currently  -resume 50/50 insulin if indicated     LOS: 3 days A FACE TO FACE EVALUATION WAS PERFORMED  Ranelle Oyster 04/08/2018, 8:58 AM

## 2018-04-09 LAB — BASIC METABOLIC PANEL WITH GFR
Anion gap: 15 (ref 5–15)
BUN: 31 mg/dL — ABNORMAL HIGH (ref 6–20)
CO2: 23 mmol/L (ref 22–32)
Calcium: 9.5 mg/dL (ref 8.9–10.3)
Chloride: 99 mmol/L (ref 98–111)
Creatinine, Ser: 1.86 mg/dL — ABNORMAL HIGH (ref 0.44–1.00)
GFR calc Af Amer: 34 mL/min — ABNORMAL LOW
GFR calc non Af Amer: 30 mL/min — ABNORMAL LOW
Glucose, Bld: 124 mg/dL — ABNORMAL HIGH (ref 70–99)
Potassium: 3.4 mmol/L — ABNORMAL LOW (ref 3.5–5.1)
Sodium: 137 mmol/L (ref 135–145)

## 2018-04-09 LAB — GLUCOSE, CAPILLARY
Glucose-Capillary: 112 mg/dL — ABNORMAL HIGH (ref 70–99)
Glucose-Capillary: 123 mg/dL — ABNORMAL HIGH (ref 70–99)
Glucose-Capillary: 98 mg/dL (ref 70–99)
Glucose-Capillary: 145 mg/dL — ABNORMAL HIGH (ref 70–99)

## 2018-04-09 NOTE — Progress Notes (Signed)
Pt has been having nose bleeds with bright red clots. Physician on-call was called and notified.

## 2018-04-09 NOTE — Progress Notes (Signed)
Katherine [name] is a 58 y.o. female admitted for CIR with functional and mobility deficits secondary to complex medical issues including congestive heart failure  Past Medical History:  Diagnosis Date  . Anxiety   . Arthritis   . Asthma   . Bronchitis   . CHF (congestive heart failure) (HCC)    a. EF 20-25% by cath in 11/2010 with cath showing normal cors  . CKD (chronic kidney disease) stage 3, GFR 30-59 ml/min (HCC)   . Fibromyalgia   . GERD (gastroesophageal reflux disease)   . Hallux valgus of left foot   . Hay fever   . Headache(784.0)   . History of pneumonia   . History of seizures   . Hyperlipidemia   . Hypertension   . Nonischemic cardiomyopathy (HCC)   . Obesity   . Stroke (HCC)   . Systemic lupus erythematosus (HCC)   . Type 2 diabetes mellitus (HCC)      Subjective: No new complaints. No new problems.  Only issue is persistent left-sided epistaxis over the past few days.  Required some nasal packing through the night  Objective: Vital signs in last 24 hours: Temp:  [97.8 F (36.6 C)-98.1 F (36.7 C)] 97.8 F (36.6 C) (03/21 0546) Pulse Rate:  [82-90] 83 (03/21 0859) Resp:  [16-18] 18 (03/21 0546) BP: (112-118)/(70-76) 112/72 (03/21 0546) SpO2:  [99 %-100 %] 99 % (03/21 0829) Weight:  [97.8 kg] 97.8 kg (03/21 0700) Weight change: -0.6 kg Last BM Date: 04/08/18  Intake/Output from previous day: 03/20 0701 - 03/21 0700 In: 480 [P.O.:480] Out: -  Last cbgs: CBG (last 3)  Recent Labs    04/08/18 1634 04/08/18 2126 04/09/18 0624  GLUCAP 92 87 112*   Patient Vitals for the past 24 hrs:  BP Temp Temp src Pulse Resp SpO2 Weight  04/09/18 0859 - - - 83 - - -  04/09/18 0829 - - - - - 99 % -  04/09/18 0700 - - - - - - 97.8 kg  04/09/18 0546 112/72 97.8 F (36.6 C) Oral 90 18 99 % -  04/08/18 1939 113/70 98.1 F (36.7 C) - 86 16 100 % -  04/08/18 1933 - - - - - 99 % -  04/08/18 1541 118/76 97.8 F (36.6 C) Oral 82 16 100 % -     Physical  Exam General: No apparent distress obese.  Alert HEENT: not dry; nasal packing left nares Lungs: Normal effort. Lungs clear to auscultation, no crackles or wheezes. Cardiovascular: Regular rate and rhythm, no edema Abdomen: S/NT/ND; BS(+) Musculoskeletal:  unchanged Neurological: No new neurological deficits with some lower extremity weakness Skin: clear   Mental state: Alert, oriented, cooperative    Lab Results: BMET    Component Value Date/Time   NA 137 04/09/2018 0532   NA 143 03/14/2018 1702   K 3.4 (L) 04/09/2018 0532   CL 99 04/09/2018 0532   CO2 23 04/09/2018 0532   GLUCOSE 124 (H) 04/09/2018 0532   BUN 31 (H) 04/09/2018 0532   BUN 27 (H) 03/14/2018 1702   CREATININE 1.86 (H) 04/09/2018 0532   CREATININE 1.75 (H) 08/22/2015 1440   CALCIUM 9.5 04/09/2018 0532   GFRNONAA 30 (L) 04/09/2018 0532   GFRAA 34 (L) 04/09/2018 0532   CBC    Component Value Date/Time   WBC 7.9 04/05/2018 0427   RBC 4.36 04/05/2018 0427   HGB 13.3 04/05/2018 0427   HCT 40.3 04/05/2018 0427   PLT 483 (H) 04/05/2018  0427   MCV 92.4 04/05/2018 0427   MCH 30.5 04/05/2018 0427   MCHC 33.0 04/05/2018 0427   RDW 18.5 (H) 04/05/2018 0427   LYMPHSABS 3.2 04/05/2018 0427   MONOABS 0.6 04/05/2018 0427   EOSABS 0.3 04/05/2018 0427   BASOSABS 0.1 04/05/2018 0427    Medications: I have reviewed the patient's current medications.  Assessment/Plan:  Functional deficits secondary to general debility.  Continue CIR  DVT prophylaxis.  Continue Lovenox  Epistaxis.  We will continue to observe;  present medications include Lovenox and Plavix  NICM/VT s/p ICD  Chronic kidney disease.  Creatinine improved to 1.86  Acute on chronic systolic heart failure.  No change in therapy  Diabetes mellitus.  Continue SSI therapy.  Blood sugars remain well controlled  Length of stay, days: 4  Gordy Savers , MD 04/09/2018, 9:21 AM

## 2018-04-09 NOTE — Progress Notes (Signed)
No nose bleeds noted throughout shift. drsg removed to nose/facial area. Will cont to monitor.  Ross Ludwig, LPN

## 2018-04-10 ENCOUNTER — Inpatient Hospital Stay (HOSPITAL_COMMUNITY): Payer: Medicaid Other | Admitting: Occupational Therapy

## 2018-04-10 ENCOUNTER — Inpatient Hospital Stay (HOSPITAL_COMMUNITY): Payer: Medicaid Other

## 2018-04-10 LAB — BASIC METABOLIC PANEL
Anion gap: 14 (ref 5–15)
BUN: 30 mg/dL — ABNORMAL HIGH (ref 6–20)
CO2: 25 mmol/L (ref 22–32)
Calcium: 9.5 mg/dL (ref 8.9–10.3)
Chloride: 100 mmol/L (ref 98–111)
Creatinine, Ser: 1.75 mg/dL — ABNORMAL HIGH (ref 0.44–1.00)
GFR calc Af Amer: 37 mL/min — ABNORMAL LOW (ref >60)
GFR, EST NON AFRICAN AMERICAN: 32 mL/min — AB (ref >60)
Glucose, Bld: 124 mg/dL — ABNORMAL HIGH (ref 70–99)
Potassium: 3.5 mmol/L (ref 3.5–5.1)
Sodium: 139 mmol/L (ref 135–145)

## 2018-04-10 LAB — GLUCOSE, CAPILLARY
Glucose-Capillary: 113 mg/dL — ABNORMAL HIGH (ref 70–99)
Glucose-Capillary: 118 mg/dL — ABNORMAL HIGH (ref 70–99)
Glucose-Capillary: 117 mg/dL — ABNORMAL HIGH (ref 70–99)
Glucose-Capillary: 149 mg/dL — ABNORMAL HIGH (ref 70–99)

## 2018-04-10 NOTE — Progress Notes (Addendum)
Physical Therapy Session Note  Patient Details  Name: Katherine [name] MRN: [id number] Date of Birth: [birth year]  Today's Date: 04/10/2018 PT Individual Time: 1100-1200 and 6834-1962 PT Individual Time Calculation (min): 60 min and 25 minutes   Short Term Goals: Week 1:  PT Short Term Goal 1 (Week 1): =LTG due to ELOS  Skilled Therapeutic Interventions/Progress Updates:   Session 1:  Pt seated in recliner upon PT arrival, agreeable to therapy tx and denies pain. Pt transferred to standing with supervision and ambulated into bathroom with rollator ans supervision. Pt performed all toileting with distant supervision, washed hands at sink with supervision for standing balance. Pt ambulated to the dayroom with rollator and supervision x 150 ft. Pt used nustep this session x 6 minutes on workload 5 for global strengthening and endurance. Pt ambulated x 100 ft plus x60 ft with SPC and CGA this session, pt reports she uses a cane sometimes within the house. Pt ambulated 2 x 30 ft this session without AD for balance and endurance CGA, pt reports sometimes she will walk into the kitchen at home without AD. Pt worked on dynamic balance this session to perform toe taps to 4 inch step without AD 2 x 5 each LE, min assist. Pt ambulated to mat with CGA and SPC. Pt performed 2 x 5 sit<>stands this session from elevated mat without UE support for LE strengthening, verbal cues for techniques. Pt ambulated back to room with rollator and supervision x 200 ft. Pt left in recliner with needs in reach and chair alarm set.   Session 2: Pt seated in recliner upon PT arrival, agreeable to therapy tx and denies pain. Pt donned shoes and socks with supervision. Pt ambulated to the gym with rollator and supervision x 200 ft. Pt worked on standing balance this session without UE support while tossing horseshoes, x 1 trial standing on level surface and x1 trial standing on airex, supervision for balance. Pt performed x 5  sit<>stands from elevated mat without UE support, CGA. Pt ambulated back to room x 200 ft with rollator and supervision, pt transferred to bed and to supine and left with needs in reach and bed alarm set.   Therapy Documentation Precautions:  Precautions Precautions: Fall Precaution Comments: ICD Restrictions Weight Bearing Restrictions: No  Therapy/Group: Individual Therapy  Cresenciano Genre, PT, DPT 04/10/2018, 8:00 AM

## 2018-04-10 NOTE — Progress Notes (Signed)
Katherine [name] is a 58 y.o. female admitted for CIR with mobility and functional deficits secondary to complex medical issues including congestive heart failure  Past Medical History:  Diagnosis Date  . Anxiety   . Arthritis   . Asthma   . Bronchitis   . CHF (congestive heart failure) (HCC)    a. EF 20-25% by cath in 11/2010 with cath showing normal cors  . CKD (chronic kidney disease) stage 3, GFR 30-59 ml/min (HCC)   . Fibromyalgia   . GERD (gastroesophageal reflux disease)   . Hallux valgus of left foot   . Hay fever   . Headache(784.0)   . History of pneumonia   . History of seizures   . Hyperlipidemia   . Hypertension   . Nonischemic cardiomyopathy (HCC)   . Obesity   . Stroke (HCC)   . Systemic lupus erythematosus (HCC)   . Type 2 diabetes mellitus (HCC)      Subjective: No new complaints. No new problems. Slept well.  No recurrent epistaxis following removal of nasal pack  Objective: Vital signs in last 24 hours: Temp:  [97.7 F (36.5 C)-98.2 F (36.8 C)] 98.2 F (36.8 C) (03/22 0408) Pulse Rate:  [79-90] 79 (03/22 0730) Resp:  [16] 16 (03/22 0408) BP: (111-122)/(66-78) 122/78 (03/22 0408) SpO2:  [98 %-100 %] 98 % (03/22 0808) Weight:  [96.8 kg] 96.8 kg (03/22 0408) Weight change: -1 kg Last BM Date: 04/09/18  Intake/Output from previous day: 03/21 0701 - 03/22 0700 In: 480 [P.O.:480] Out: -  Last cbgs: CBG (last 3)  Recent Labs    04/09/18 1651 04/09/18 2131 04/10/18 0627  GLUCAP 98 145* 113*   Patient Vitals for the past 24 hrs:  BP Temp Temp src Pulse Resp SpO2 Weight  04/10/18 0808 - - - - - 98 % -  04/10/18 0730 - - - 79 - - -  04/10/18 0408 122/78 98.2 F (36.8 C) - 90 16 100 % 96.8 kg  04/09/18 2129 111/66 98.2 F (36.8 C) Oral 87 16 100 % -  04/09/18 1929 - - - - - 98 % -  04/09/18 1439 120/70 97.7 F (36.5 C) Oral 82 16 100 % -     Physical Exam General: No apparent distress obese HEENT: not dry; nasal packing left nares  removed Lungs: Normal effort. Lungs clear to auscultation, no crackles or wheezes. Cardiovascular: Regular rate and rhythm, no edema Abdomen: S/NT/ND; BS(+) Musculoskeletal:  unchanged Neurological: No new neurological deficits with some  lower extremity weakness   Skin: clear no rash Mental state: Alert, oriented, cooperative    Lab Results: BMET    Component Value Date/Time   NA 139 04/10/2018 0605   NA 143 03/14/2018 1702   K 3.5 04/10/2018 0605   CL 100 04/10/2018 0605   CO2 25 04/10/2018 0605   GLUCOSE 124 (H) 04/10/2018 0605   BUN 30 (H) 04/10/2018 0605   BUN 27 (H) 03/14/2018 1702   CREATININE 1.75 (H) 04/10/2018 0605   CREATININE 1.75 (H) 08/22/2015 1440   CALCIUM 9.5 04/10/2018 0605   GFRNONAA 32 (L) 04/10/2018 0605   GFRAA 37 (L) 04/10/2018 0605   CBC    Component Value Date/Time   WBC 7.9 04/05/2018 0427   RBC 4.36 04/05/2018 0427   HGB 13.3 04/05/2018 0427   HCT 40.3 04/05/2018 0427   PLT 483 (H) 04/05/2018 0427   MCV 92.4 04/05/2018 0427   MCH 30.5 04/05/2018 0427   MCHC 33.0 04/05/2018  0427   RDW 18.5 (H) 04/05/2018 0427   LYMPHSABS 3.2 04/05/2018 0427   MONOABS 0.6 04/05/2018 0427   EOSABS 0.3 04/05/2018 0427   BASOSABS 0.1 04/05/2018 0427    Medications: I have reviewed the patient's current medications.  Assessment/Plan:  Functional deficits/general debility.  Continue CIR Epistaxis presently resolved DVT prophylaxis.  Continue Lovenox NICM/recurrent DVT.  Status post ICD Diabetes mellitus.  Remains stable continue SSI Chronic systolic heart failure compensated    Length of stay, days: 5  Gordy Savers , MD 04/10/2018, 9:13 AM

## 2018-04-10 NOTE — Progress Notes (Signed)
Occupational Therapy Session Note  Patient Details  Name: Katherine [name] MRN: [id number] Date of Birth: 1960/12/07  Today's Date: 04/10/2018 OT Individual Time: 5945-8592 and 1250-1335 OT Individual Time Calculation (min): 58 min and 45 min   Short Term Goals: Week 1:  OT Short Term Goal 1 (Week 1): STGs=LTGs secondary to short estimated LOS  Skilled Therapeutic Interventions/Progress Updates:    Session One: Pt seen for OT ADL bathing/dressing session. Pt awake in supine upon arrival, denying pain and agreeable to tx session. She ambulated throughout room with rollator and supervision to gather clothing items in prep for shower. She ambulated into bathroom and required VCs for rollator management and safety awareness when completing functional transfers and gathering towels/washclothes in prep for shower. She bathed seated on tub transfer bench with intermittent supervision. Following shower, she stood with close supervision for dynamic balance while drying off. She returned to toilet to dress with supervision.  Grooming tasks completed standing at sink mod I. Throughout session, pt requiring increased time and rest breaks throughout ADL tasks 2/2 decreased functional activity tolerance. Pt able to independetnly initiate energy conservation techniques taught in previous sessions.  She returned to recliner at end of session, left seated with all needs in reach.   Session Two: Pt seen for OT session focusing on functional activity tolerance and ambulation during IADL task. Pt sitting up in recliner upon arrival having just finished lunch and agreeable to tx session, denying pain. She ambulated throughout room and unit using rollator with supervision, intermittent cuing for rollator management and brakes. Completed toileting task and hand hygiene with supervision.  She ambulated to ADL apartment, self initiated standing rest breaks along the way.  Pt desiring to attempt home making task without  AD. She completed simple meal prep task of making cooked egg at stove level. She gathered needed supplies from overhead and low cabinets with supervision. Cooking task completed at overall supervision level, used counter to lean on for rest breaks during task. She demonstrated good safety awareness during task. One seated rest break to eat egg then returned to kitchen to wash dishes.  Following seated rest break, pt ambulated back to room with use of rollator and supervision. Pt left seated in recliner at end of session with all needs in reach and chair pad alarm on. Throughout session, education provided regarding AD, energy conservation, rollator management and safety and d/c planning.   Therapy Documentation Precautions:  Precautions Precautions: Fall Precaution Comments: ICD Restrictions Weight Bearing Restrictions: No Pain:    No/denies pain   Therapy/Group: Individual Therapy  Niaja Stickley L 04/10/2018, 6:38 AM

## 2018-04-11 ENCOUNTER — Inpatient Hospital Stay (HOSPITAL_COMMUNITY): Payer: Medicaid Other | Admitting: Physical Therapy

## 2018-04-11 ENCOUNTER — Inpatient Hospital Stay (HOSPITAL_COMMUNITY): Payer: Medicaid Other | Admitting: Occupational Therapy

## 2018-04-11 LAB — BASIC METABOLIC PANEL
Anion gap: 11 (ref 5–15)
BUN: 32 mg/dL — ABNORMAL HIGH (ref 6–20)
CALCIUM: 9.1 mg/dL (ref 8.9–10.3)
CO2: 24 mmol/L (ref 22–32)
Chloride: 102 mmol/L (ref 98–111)
Creatinine, Ser: 1.77 mg/dL — ABNORMAL HIGH (ref 0.44–1.00)
GFR calc Af Amer: 36 mL/min — ABNORMAL LOW (ref >60)
GFR calc non Af Amer: 31 mL/min — ABNORMAL LOW (ref >60)
Glucose, Bld: 116 mg/dL — ABNORMAL HIGH (ref 70–99)
Potassium: 3.6 mmol/L (ref 3.5–5.1)
Sodium: 137 mmol/L (ref 135–145)

## 2018-04-11 LAB — GLUCOSE, CAPILLARY
GLUCOSE-CAPILLARY: 101 mg/dL — AB (ref 70–99)
Glucose-Capillary: 109 mg/dL — ABNORMAL HIGH (ref 70–99)
Glucose-Capillary: 133 mg/dL — ABNORMAL HIGH (ref 70–99)

## 2018-04-11 LAB — CBC
HCT: 39.8 % (ref 36.0–46.0)
Hemoglobin: 13.1 g/dL (ref 12.0–15.0)
MCH: 30.3 pg (ref 26.0–34.0)
MCHC: 32.9 g/dL (ref 30.0–36.0)
MCV: 91.9 fL (ref 80.0–100.0)
Platelets: 403 10*3/uL — ABNORMAL HIGH (ref 150–400)
RBC: 4.33 MIL/uL (ref 3.87–5.11)
RDW: 18.3 % — ABNORMAL HIGH (ref 11.5–15.5)
WBC: 5.6 10*3/uL (ref 4.0–10.5)
nRBC: 0 % (ref 0.0–0.2)

## 2018-04-11 MED ORDER — ISOSORB DINITRATE-HYDRALAZINE 20-37.5 MG PO TABS
1.5000 | ORAL_TABLET | Freq: Three times a day (TID) | ORAL | Status: DC
  Administered 2018-04-11: 1.5 via ORAL
  Administered 2018-04-11: 1 via ORAL
  Administered 2018-04-12: 1.5 via ORAL
  Filled 2018-04-11 (×5): qty 1.5

## 2018-04-11 NOTE — Care Management (Signed)
[facility] Individual Statement of Services  Patient Name:  Katherine [name]  Date:  04/11/2018  Welcome to the [facility].  Our goal is to provide you with an individualized program based on your diagnosis and situation, designed to meet your specific needs.  With this comprehensive rehabilitation program, you will be expected to participate in at least 3 hours of rehabilitation therapies Monday-Friday, with modified therapy programming on the weekends.  Your rehabilitation program will include the following services:  Physical Therapy (PT), Occupational Therapy (OT), 24 hour per day rehabilitation nursing, Case Management (Social Worker), Rehabilitation Medicine, Nutrition Services and Pharmacy Services  Weekly team conferences will be held on Tuesdays to discuss your progress.  Your Social Worker will talk with you frequently to get your input and to update you on team discussions.  Team conferences with you and your family in attendance may also be held.  Expected length of stay: 5-7 days   Overall anticipated outcome: supervision  Depending on your progress and recovery, your program may change. Your Social Worker will coordinate services and will keep you informed of any changes. Your Social Worker's name and contact numbers are listed  below.  The following services may also be recommended but are not provided by the [facility]:   Driving Evaluations  Home Health Rehabiltiation Services  Outpatient Rehabilitation Services   Arrangements will be made to provide these services after discharge if needed.  Arrangements include referral to agencies that provide these services.  Your insurance has been verified to be:  Medicaid Your primary doctor is:  Alvina Filbert  Pertinent information will be shared with your doctor and your insurance company.  Social Worker:  [place], [place] [phone] or ([phone]    Information discussed with and copy given to patient by: Amada Jupiter, 04/11/2018, 3:27 PM

## 2018-04-11 NOTE — Progress Notes (Signed)
[place] PHYSICAL MEDICINE & REHABILITATION PROGRESS NOTE   Subjective/Complaints: Lying in bed. No new complaints. Feels well.   ROS: Patient denies fever, rash, sore throat, blurred vision, nausea, vomiting, diarrhea, cough, shortness of breath or chest pain, joint or back pain, headache, or mood change.      Objective:   No results found. Recent Labs    04/11/18 0535  WBC 5.6  HGB 13.1  HCT 39.8  PLT 403*   Recent Labs    04/10/18 0605 04/11/18 0535  NA 139 137  K 3.5 3.6  CL 100 102  CO2 25 24  GLUCOSE 124* 116*  BUN 30* 32*  CREATININE 1.75* 1.77*  CALCIUM 9.5 9.1    Intake/Output Summary (Last 24 hours) at 04/11/2018 0518 Last data filed at 04/10/2018 1845 Gross per 24 hour  Intake 480 ml  Output -  Net 480 ml     Physical Exam: Vital Signs Blood pressure 119/71, pulse 88, temperature 97.8 F (36.6 C), temperature source Oral, resp. rate 17, height 6' (1.829 m), weight (P) 97.9 kg, last menstrual period 01/30/2011, SpO2 98 %. Constitutional: No distress . Vital signs reviewed. HEENT: EOMI, oral membranes moist Neck: supple Cardiovascular: RRR without murmur. No JVD    Respiratory: CTA Bilaterally without wheezes or rales. Normal effort    GI: BS +, non-tender, non-distended  Musculoskeletal:Normal range of motion.  General: No deformity.  Comments: BLE with tr    edema   Neurological: She is alertand oriented to person, place, and time. Speech clear. Able to follow basic commands without difficulty. RUE 5/5. LUE 4+/5. RLE 4- to 4/5 prox to distal. LLE 4- to 4/5 prox to distal. No focal sensory loss, cognitively intact---  Skin: Scabs on BL LE healing Psychiatric: pleasant.    Assessment/Plan: 1. Functional deficits secondary to debility which require 3+ hours per day of interdisciplinary therapy in a comprehensive inpatient rehab setting.  Physiatrist is providing close team supervision and 24 hour management of active medical  problems listed below.  Physiatrist and rehab team continue to assess barriers to discharge/monitor patient progress toward functional and medical goals  Care Tool:  Bathing    Body parts bathed by patient: Right arm, Left arm, Chest, Abdomen, Front perineal area, Right upper leg, Left upper leg, Face, Buttocks, Right lower leg, Left lower leg   Body parts bathed by helper: Buttocks Body parts n/a: Right lower leg, Left lower leg   Bathing assist Assist Level: Supervision/Verbal cueing     Upper Body Dressing/Undressing Upper body dressing   What is the patient wearing?: Pull over shirt    Upper body assist Assist Level: Independent    Lower Body Dressing/Undressing Lower body dressing      What is the patient wearing?: Underwear/pull up, Pants     Lower body assist Assist for lower body dressing: Independent with assitive device     Toileting Toileting    Toileting assist Assist for toileting: Independent with assistive device Assistive Device Comment: Front wheel walker   Transfers Chair/bed transfer  Transfers assist     Chair/bed transfer assist level: Independent with assistive device Chair/bed transfer assistive device: Other(Rollator)   Locomotion Ambulation   Ambulation assist      Assist level: Supervision/Verbal cueing Assistive device: Rollator Max distance: 150'   Walk 10 feet activity   Assist     Assist level: Supervision/Verbal cueing Assistive device: Rollator   Walk 50 feet activity   Assist    Assist level: Supervision/Verbal  cueing Assistive device: Rollator    Walk 150 feet activity   Assist    Assist level: Supervision/Verbal cueing Assistive device: Rollator    Walk 10 feet on uneven surface  activity   Assist Walk 10 feet on uneven surfaces activity did not occur: Safety/medical concerns         Wheelchair     Assist Will patient use wheelchair at discharge?: Yes Type of Wheelchair: Manual     Wheelchair assist level: Supervision/Verbal cueing Max wheelchair distance: 56'    Wheelchair 50 feet with 2 turns activity    Assist        Assist Level: Supervision/Verbal cueing   Wheelchair 150 feet activity     Assist Wheelchair 150 feet activity did not occur: Safety/medical concerns        Medical Problem List and Plan: 1.Functional and mobility deficitssecondary to congestive heart failure and multiple medical issues --Continue CIR therapies including PT, OT   2. Antithrombotics: -DVT/anticoagulation:Pharmaceutical:Lovenox -antiplatelet therapy: On Plavix daily. 3.Fibromyalgia/Lupus/Pain Management:Tylenol prn. 4. Mood:LCSW to follow for evaluation and support. -antipsychotic agents: N/A 5. Neuropsych: This patientiscapable of making decisions onherown behalf. 6. Skin/Wound Care:Unna boots replaced 3/17. Added ensure max to help with protein stores/ wound healing. 7. Fluids/Electrolytes/Nutrition:Monitor I/O.   Continue 2000cc/FR.      -I personally reviewed the patient's labs today.   8. NICM/VT s/p ICD: On Ranolazine. Reported to have 17 ICD discharges since 01/2018. Amiodarone decreased to 200 mg bid due to tremors. BB on hold due to low output.   -HR controlled 9. SLE: controlled on hydroxychloroquine.  10. Acute renal failure: Serum Creatinine trending back up.     -Cr down to 1.77 today, close to baseline 11. Gout flare: Treated with 3 day course ofprednisone. Continuecolchicine.  12. Acute on chronic systolic CHF: Likely exacerbated by VT.    -Continue spironolactone, digoxin and bidil.   -Torsemide 20 mg daily resumed  -weights stable Filed Weights   04/09/18 0700 04/10/18 0408 04/11/18 8299  Weight: 97.8 kg 96.8 kg (P) 97.9 kg    13. Hypokalemia: 3.6  -continue supplementing  14. T2DM: Hgb A1C- 6.8.  -SSI only  -sugars controlled  -PO intake is 100% currently  -resume 50/50  insulin if indicated     LOS: 6 days A FACE TO FACE EVALUATION WAS PERFORMED  Ranelle Oyster 04/11/2018, 9:07 AM

## 2018-04-11 NOTE — Progress Notes (Signed)
Occupational Therapy Session Note  Patient Details  Name: Katherine [name] MRN: [id number] Date of Birth: 1960-02-09  Today's Date: 04/11/2018 OT Individual Time: 2458-0998 and 1235-1300 OT Individual Time Calculation (min): 60 min and 25 min OT Missed time: 35 min (nausea/vomiting)   Short Term Goals: Week 1:  OT Short Term Goal 1 (Week 1): STGs=LTGs secondary to short estimated LOS  Skilled Therapeutic Interventions/Progress Updates:    Session One: Pt seen for OT ADL bathing/dressing session. Pt in supine upon arrival with RN administering AM medications. Pt denying pain and agreeable to tx session. Discussed AD for use at d/c, trialing RW vs rollator vs cane vs no AD. Pt reports kitchen at home is small and she will not use AD while ambulating in kitchen. She plans to use cane at d/c. Therefore, practiced with use of cane during ADL session this AM. Discussed at length procs/cons of various pieces of AD. Pt reports she has all AD listed above and that PTA she would you different pieces based on activity, fatigue and pain for that day.  She ambulated throughout room to gather clothing items in prep for shower with supervision reaching into low drawers to obtain needed items. She bathed with intermittent supervision seated on tub transfer bench.  She returned to standard chair to dress at distant supervision-mod I level. Grooming tasks completed from seated level for energy conservation mod I. Seated rest breaks required throughout bathing/dressing routine 2/2 decreased functional activity tolerance.  Pt returned to recliner at end of session. , all needs in reach. Pt made mod I in room in prep for d/c home tomorrow. Pt instructed to use rollator or RW in room at this time until PT can assess use of cane. Pt voiced understanding.   Session Two: Pt seen for OT session focusing on functional ambulation and activity tolerance. Pt sitting up in recliner upon arrival having just finished lunch.  Complaints of generalized arthritis pain, declined need for intervention at this time. Heating pad provided at end of session for comfort.  She ambulated throughout room with SPC mod I. Completed toileting task, +BM at mod I level. Required seated rest break following void and hand hygiene standing at sink. Following seated rest break, pt stood to ambulate out of door, pt with sudden onset of  Nausea, ambulating quickly to bathroom and vomiting copious amounts into toilet. RN present for this. She returned to w/c and hand off to RN for cont assessment as pt declining cont therapy at this time.    Therapy Documentation Precautions:  Precautions Precautions: Fall Precaution Comments: ICD Restrictions Weight Bearing Restrictions: No   Therapy/Group: Individual Therapy  Katherine [name] L 04/11/2018, 7:03 AM

## 2018-04-11 NOTE — Progress Notes (Addendum)
Physical Therapy Discharge Summary  Patient Details  Name: Katherine [name] MRN: [id number] Date of Birth: [birth year]  Today's Date: 04/11/2018 PT Individual Time: 6948-5462; 1440-1450 PT Individual Time Calculation (min): 60 min and 10 min PT Missed Time: 20 minutes Missed Time Reason: Patient illness (nausea/vomiting)   Patient has met 6 of 6 long term goals due to improved activity tolerance, improved balance, improved postural control, increased strength and ability to compensate for deficits.  Patient to discharge at an ambulatory level Modified Independent.   Patient's care partner is independent to provide the necessary physical assistance at discharge. Pt demos good safety and balance with all functional mobility at mod I level with use of either rollator or SPC depending on fatigue level.  Reasons goals not met: Pt has met all rehab goals.  Recommendation:  Patient will benefit from ongoing skilled PT services in home health setting to continue to advance safe functional mobility, address ongoing impairments in endurance, strength, safety, and minimize fall risk.  Equipment: No equipment provided. Pt already owns all necessary equipment including a RW, a rollator, a SPC, and a manual w/c.  Reasons for discharge: treatment goals met and discharge from hospital  Patient/family agrees with progress made and goals achieved: Yes   Skilled Intervention: Session 1: Pt received seated in recliner in room, agreeable to PT session. No complaints of pain. Pt transfers sit to stand with mod I throughout session with intermittent use of SPC and 4WW. Ambulation x 200 ft with rollator and mod I. Ascend/descend 4 stairs with R handrail and SPC in L hand with Supervision. Car transfer mod I. Bed mobility mod I. Trial ambulation with SPC, pt demos good safety and balance with use of SPC. Pt does exhibit wider BOS with use of SPC but states this is her PLOF. Pt is safe to ambulate around her room  using either rollator or SPC. Pt left seated in recliner in room with needs in reach.  Session 2: Pt received supine in bed, per OT report and pt report she has been having ongoing nausea and vomiting this PM, RN aware. Pt provided with education with regards to d/c plan, safe mobility in the home, follow up therapy, and HEP, handout provided. Pt declines any further participation in session after education due to ongoing symptoms. Pt left supine in bed with needs in reach. Pt missed 20 min of scheduled therapy time due to illness.  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) Perception Perception: Within Functional Limits Praxis Praxis: Intact  Cognition Overall Cognitive Status: Within Functional Limits for tasks assessed Arousal/Alertness: Awake/alert Orientation Level: Oriented X4 Attention: Focused Focused Attention: Appears intact Memory: Appears intact Awareness: Appears intact Problem Solving: Appears intact Safety/Judgment: Appears intact Sensation Sensation Light Touch: Impaired Detail Light Touch Impaired Details: Impaired RLE;Impaired LLE(decreased entire RLE, decreased distal LLE) Proprioception: Appears Intact Coordination Gross Motor Movements are Fluid and Coordinated: Yes Fine Motor Movements are Fluid and Coordinated: Yes Coordination and Movement Description:  generalized weakness Heel Shin Test: intact B Motor  Motor Motor: Within Functional Limits Motor - Discharge Observations: [facility]  Mobility Bed Mobility Bed Mobility: Rolling Right;Rolling Left;Supine to Sit;Sit to Supine Rolling Right: Independent with assistive device Rolling Left: Independent with assistive device Supine to Sit: Independent with assistive  device Sit to Supine: Independent with assistive device Transfers Transfers: Sit to  Stand;Stand Pivot Transfers Sit to Stand: Independent with assistive device Stand Pivot Transfers: Independent with assistive device Transfer (Assistive device): 4-wheeled walker Locomotion  Gait Ambulation: Yes Gait Assistance: Independent with assistive device Gait Distance (Feet): 200 Feet Assistive device: 4-wheeled walker Gait Gait: Yes Gait Pattern: Impaired Gait Pattern: Wide base of support;Right foot flat;Left foot flat Gait velocity: decreased Stairs / Additional Locomotion Stairs: Yes Stairs Assistance: Supervision/Verbal cueing Stair Management Technique: One rail Right;With cane Number of Stairs: 4 Height of Stairs: 6 Wheelchair Mobility Wheelchair Mobility: No  Trunk/Postural Assessment  Cervical Assessment Cervical Assessment: Within Functional Limits Thoracic Assessment Thoracic Assessment: Exceptions to WFL(rounded shoulders) Lumbar Assessment Lumbar Assessment: Exceptions to WFL(posterior pelvic tilt) Postural Control Postural Control: Within Functional Limits  Balance Balance Balance Assessed: Yes Static Sitting Balance Static Sitting - Balance Support: No upper extremity supported;Feet supported Static Sitting - Level of Assistance: 7: Independent Dynamic Sitting Balance Dynamic Sitting - Balance Support: No upper extremity supported;Feet supported;During functional activity Dynamic Sitting - Level of Assistance: 6: Modified independent (Device/Increase time) Static Standing Balance Static Standing - Balance Support: During functional activity;No upper extremity supported Static Standing - Level of Assistance: 6: Modified independent (Device/Increase time);5: Stand by assistance Dynamic Standing Balance Dynamic Standing - Balance Support: During functional activity;Right upper extremity supported;Left upper extremity supported Dynamic Standing - Level of Assistance:  5: Stand by assistance;6: Modified independent (Device/Increase time) Extremity Assessment   RLE Assessment RLE Assessment: Within Functional Limits General Strength Comments: 4/5 grossly LLE Assessment LLE Assessment: Within Functional Limits General Strength Comments: 4/5 grossly, 3/5 ankle DF     Excell Seltzer, PT, DPT 04/11/2018, 12:59 PM

## 2018-04-11 NOTE — Progress Notes (Signed)
Patient ID: Katherine [name], female   DOB: 10-Aug-1960, 58 y.o.   MRN: [id number]     Advanced Heart Failure Rounding Note  PCP-Cardiologist: Lewayne Bunting, MD   Subjective:    Creatinine down to 1.77.  She is doing well with PT, only complains of arthritis. No significant dyspnea.   Objective:   Weight Range: (P) 97.9 kg Body mass index is 29.27 kg/m (pended).   Vital Signs: Temp:  [97.8 F (36.6 C)-97.9 F (36.6 C)] 97.8 F (36.6 C) (03/23 0401) Pulse Rate:  [81-88] 88 (03/23 0401) Resp:  [16-18] 17 (03/23 0401) BP: (109-119)/(61-71) 119/71 (03/23 0401) SpO2:  [98 %-100 %] 98 % (03/23 0746) Weight:  [97.9 kg] (P) 97.9 kg (03/23 0632) Last BM Date: 04/10/18  Weight change: Filed Weights   04/09/18 0700 04/10/18 0408 04/11/18 5035  Weight: 97.8 kg 96.8 kg (P) 97.9 kg   Intake/Output:   Intake/Output Summary (Last 24 hours) at 04/11/2018 0854 Last data filed at 04/10/2018 1845 Gross per 24 hour  Intake 720 ml  Output -  Net 720 ml    Physical Exam    General: NAD Neck: No JVD, no thyromegaly or thyroid nodule.  Lungs: Clear to auscultation bilaterally with normal respiratory effort. CV: Nondisplaced PMI.  Heart regular S1/S2, no S3/S4, no murmur.  Trace ankle edema.   Abdomen: Soft, nontender, no hepatosplenomegaly, no distention.  Skin: Intact without lesions or rashes.  Neurologic: Alert and oriented x 3.  Psych: Normal affect. Extremities: No clubbing or cyanosis.  HEENT: Normal.   Telemetry   Not connected (CIR)  EKG    No new tracings.    Labs    CBC Recent Labs    04/11/18 0535  WBC 5.6  HGB 13.1  HCT 39.8  MCV 91.9  PLT 403*   Basic Metabolic Panel Recent Labs    46/56/81 0605 04/11/18 0535  NA 139 137  K 3.5 3.6  CL 100 102  CO2 25 24  GLUCOSE 124* 116*  BUN 30* 32*  CREATININE 1.75* 1.77*  CALCIUM 9.5 9.1   Liver Function Tests No results for input(s): AST, ALT, ALKPHOS, BILITOT, PROT, ALBUMIN in the last 72 hours. No  results for input(s): LIPASE, AMYLASE in the last 72 hours. Cardiac Enzymes No results for input(s): CKTOTAL, CKMB, CKMBINDEX, TROPONINI in the last 72 hours.  BNP: BNP (last 3 results) Recent Labs    01/12/18 0410 03/19/18 0702 03/24/18 1752  BNP 2,922.0* >4,500.0* 893.2*   ProBNP (last 3 results) No results for input(s): PROBNP in the last 8760 hours.  D-Dimer No results for input(s): DDIMER in the last 72 hours. Hemoglobin A1C No results for input(s): HGBA1C in the last 72 hours. Fasting Lipid Panel No results for input(s): CHOL, HDL, LDLCALC, TRIG, CHOLHDL, LDLDIRECT in the last 72 hours. Thyroid Function Tests No results for input(s): TSH, T4TOTAL, T3FREE, THYROIDAB in the last 72 hours.  Invalid input(s): FREET3  Other results:  Imaging   No results found.  Medications:    Scheduled Medications: . amiodarone  200 mg Oral BID  . clopidogrel  75 mg Oral Daily  . colchicine  0.6 mg Oral Daily  . digoxin  0.0625 mg Oral Daily  . docusate sodium  100 mg Oral BID  . enoxaparin (LOVENOX) injection  40 mg Subcutaneous Q24H  . feeding supplement (PRO-STAT SUGAR FREE 64)  30 mL Oral BID  . hydroxychloroquine  200 mg Oral BID  . insulin aspart  0-5 Units Subcutaneous QHS  .  insulin aspart  0-9 Units Subcutaneous TID WC  . isosorbide-hydrALAZINE  1 tablet Oral TID  . loratadine  10 mg Oral Daily  . mometasone-formoterol  2 puff Inhalation BID  . multivitamin with minerals  1 tablet Oral Daily  . potassium chloride  20 mEq Oral Daily  . ranolazine  500 mg Oral BID  . spironolactone  25 mg Oral Daily  . torsemide  80 mg Oral Daily  . venlafaxine XR  150 mg Oral Q breakfast    Infusions:   PRN Medications: acetaminophen, alum & mag hydroxide-simeth, diphenhydrAMINE, guaiFENesin-dextromethorphan, prochlorperazine **OR** prochlorperazine **OR** prochlorperazine, traZODone  Assessment/Plan   1. Acute on chronic systolic CHF: Nonischemic cardiomyopathy. Echo in  12/19 with EF 20-25%, mildly decreased RV systolic function. [employer] ICD. She had been at [facility] with CHF exacerbation and VT, creatinine rose considerably with attempts at diuresis. Lactate was elevated. Initial co-ox 38%. SBP was low at times in the 80s-90s.  Dobutamine was started at 4 mcg/kg/min and she was diuresed with IV Lasix. Weight is down considerably and she is now on po diuretic and off dobutamine. Volume status stable on po diuretics and creatinine overall lower at 1.77.  - Continue torsemide 80 mg daily for now and continue to follow renal function closely.  - Continue spironolactone 25 mg daily  - Continue low dose digoxin. Check digoxin level in am, if very low can increase digoxin to 0.125.  - Increase Bidil to 1.5 tab tid with BP trending overall higher.  - Not a good LVAD candidate with poor mobility and renal dysfunction. 2. AKI: Creatinine up to peak of 3.17 from 1.11. Suspect cardiorenal in setting of low cardiac output. She was admitted markedly volume overloaded. Creatinine now stable at 1.77.  3. VT: Patient had 17 episodes of VT with ICD discharges since 1/20. Suspect that CHF was trigger of VT. Has been quiescent in hospital. No change.  - Continue amiodarone 200 mg BID for now. Has a tremor that may be amiodarone-related.   - Continue ranolazine.  - No BB with low output.  4. H/o CVA: On Plavix. No change.  5. H/o SLE: On hydroxychloroquine.No change.  6. UTI: Completed cipro. No change.  7. Gout: Suspect flare in shoulders.  - Resolved with 3 days prednisone.  - Continue colchicine  Length of Stay: 6  Marca Ancona, MD  04/11/2018, 8:54 AM  Advanced Heart Failure Team Pager [phone] (M-F; 7a - 4p)  Please contact [facility] for night-coverage after hours (4p -7a ) and weekends on [email or web address]

## 2018-04-11 NOTE — Progress Notes (Signed)
Occupational Therapy Discharge Summary  Patient Details  Name: Katherine [name] MRN: [id number] Date of Birth: 14-Mar-1960  Patient has met 10 of 10 long term goals due to improved activity tolerance, improved balance and postural control.  Patient to discharge at overall Supervision- mod I level.  Patient's care partner is independent to provide the necessary physical assistance at discharge.  Pt's caregivers were not present during rehab admission, however, pt and CSW report that pt's significant other and family members will be able to provide the intermittent supervision level pt requires. She cont to be most limited by generalized weakness and decreased functional activity tolerance. She is able to self-initiate rest breaks as needed during ADL/IADL tasks. Pt is at distant supervision-mod I level using all pieces of AD. Pt owns all AD and reports switching btwn devices PTA based on pain, fatigue and activity for that day. She will cont to do this at d/c. Pt voices feeling comfortable and confident with planned d/c home.    Recommendation:  Patient with no further OT needs at this time. Balance/endurance deficits cont to be addressed in follow up PT.   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) Vision Assessment?: No apparent visual deficits Perception  Perception: Within Functional Limits Praxis Praxis: Intact Cognition Overall Cognitive Status: Within Functional Limits for tasks assessed Arousal/Alertness: Awake/alert Orientation Level: Oriented  X4 Focused Attention: Appears intact Memory: Appears intact Awareness: Appears intact Problem Solving: Appears intact Safety/Judgment: Appears intact Sensation Sensation Proprioception: Appears Intact Coordination Gross Motor Movements are Fluid and Coordinated: Yes Fine Motor Movements are Fluid and Coordinated: Yes Coordination and Movement Description:  generalized weakness Motor  Motor Motor: Within Functional Limits Trunk/Postural Assessment  Cervical Assessment Cervical Assessment: Within Functional Limits Thoracic Assessment Thoracic Assessment: Exceptions to WFL(Rounded shoulders) Lumbar Assessment Lumbar Assessment: Exceptions to WFL(Posterior pelvic tilt) Postural Control Postural Control: Within Functional Limits  Balance Balance Balance Assessed: Yes Static Sitting Balance Static Sitting - Balance Support: No upper extremity supported;Feet supported Static Sitting - Level of Assistance: 7: Independent Dynamic Sitting Balance Dynamic Sitting - Balance Support: No upper extremity supported;Feet supported;During functional activity Dynamic Sitting - Level of Assistance: 6: Modified independent (Device/Increase time) Sitting balance - Comments: sitting to complete bathing task Static Standing Balance Static Standing - Balance Support: During functional activity;No upper extremity supported Static Standing - Level of Assistance: 6: Modified independent (Device/Increase time);5: Stand by assistance Dynamic Standing Balance Dynamic Standing - Balance Support: During functional activity;Right upper extremity supported;Left upper extremity supported Dynamic Standing - Level of Assistance: 5: Stand by assistance;6: Modified independent (Device/Increase time) Dynamic Standing - Comments: Standing to complete LB clothing management/toileting task Extremity/Trunk Assessment RUE Assessment RUE Assessment: Within Functional Limits LUE Assessment LUE Assessment: Within  Functional Limits   Jamine Wingate L 04/11/2018, 7:23 AM

## 2018-04-12 LAB — BASIC METABOLIC PANEL
ANION GAP: 19 — AB (ref 5–15)
BUN: 33 mg/dL — ABNORMAL HIGH (ref 6–20)
CO2: 25 mmol/L (ref 22–32)
Calcium: 10 mg/dL (ref 8.9–10.3)
Chloride: 97 mmol/L — ABNORMAL LOW (ref 98–111)
Creatinine, Ser: 1.84 mg/dL — ABNORMAL HIGH (ref 0.44–1.00)
GFR calc non Af Amer: 30 mL/min — ABNORMAL LOW (ref >60)
GFR, EST AFRICAN AMERICAN: 35 mL/min — AB (ref >60)
Glucose, Bld: 110 mg/dL — ABNORMAL HIGH (ref 70–99)
Potassium: 4.1 mmol/L (ref 3.5–5.1)
Sodium: 141 mmol/L (ref 135–145)

## 2018-04-12 LAB — GLUCOSE, CAPILLARY
GLUCOSE-CAPILLARY: 129 mg/dL — AB (ref 70–99)
Glucose-Capillary: 109 mg/dL — ABNORMAL HIGH (ref 70–99)

## 2018-04-12 LAB — DIGOXIN LEVEL: Digoxin Level: 0.7 ng/mL — ABNORMAL LOW (ref 0.8–2.0)

## 2018-04-12 MED ORDER — TORSEMIDE 20 MG PO TABS: 80.0000 mg | ORAL_TABLET | Freq: Every day | ORAL | 1 refills | Status: DC

## 2018-04-12 MED ORDER — DIMENHYDRINATE 50 MG PO TABS: 25.0000 mg | ORAL_TABLET | Freq: Three times a day (TID) | ORAL | 0 refills | Status: AC | PRN

## 2018-04-12 MED ORDER — ACETAMINOPHEN 325 MG PO TABS: 325.0000 mg | ORAL_TABLET | ORAL | Status: AC | PRN

## 2018-04-12 MED ORDER — ISOSORB DINITRATE-HYDRALAZINE 20-37.5 MG PO TABS: 1.0000 | ORAL_TABLET | Freq: Three times a day (TID) | ORAL | 1 refills | Status: DC

## 2018-04-12 MED ORDER — CETIRIZINE HCL 10 MG PO TABS: 10.0000 mg | ORAL_TABLET | Freq: Every day | ORAL | 0 refills | Status: AC

## 2018-04-12 MED ORDER — RANOLAZINE ER 500 MG PO TB12: 500.0000 mg | ORAL_TABLET | Freq: Two times a day (BID) | ORAL | 1 refills | Status: DC

## 2018-04-12 MED ORDER — HYDROXYCHLOROQUINE SULFATE 200 MG PO TABS: 200.0000 mg | ORAL_TABLET | Freq: Two times a day (BID) | ORAL | 1 refills | Status: AC

## 2018-04-12 MED ORDER — VENLAFAXINE HCL ER 150 MG PO CP24: 150.0000 mg | ORAL_CAPSULE | Freq: Every day | ORAL | 1 refills | Status: AC

## 2018-04-12 MED ORDER — AMIODARONE HCL 200 MG PO TABS: 200.0000 mg | ORAL_TABLET | Freq: Two times a day (BID) | ORAL | 1 refills | Status: DC

## 2018-04-12 MED ORDER — DOCUSATE SODIUM 100 MG PO CAPS: 100.0000 mg | ORAL_CAPSULE | Freq: Two times a day (BID) | ORAL | 0 refills | Status: AC

## 2018-04-12 MED ORDER — COLCHICINE 0.6 MG PO TABS: 0.6000 mg | ORAL_TABLET | Freq: Every day | ORAL | 0 refills | Status: DC

## 2018-04-12 MED ORDER — DIGOXIN 62.5 MCG PO TABS: 0.0625 mg | ORAL_TABLET | Freq: Every day | ORAL | 1 refills | Status: DC

## 2018-04-12 MED ORDER — POTASSIUM CHLORIDE CRYS ER 20 MEQ PO TBCR: 20.0000 meq | EXTENDED_RELEASE_TABLET | Freq: Every day | ORAL | 1 refills | Status: DC

## 2018-04-12 MED ORDER — CLOPIDOGREL BISULFATE 75 MG PO TABS: 75.0000 mg | ORAL_TABLET | Freq: Every day | ORAL | 0 refills | Status: DC

## 2018-04-12 MED ORDER — POLYETHYLENE GLYCOL 3350 17 G PO PACK: 17.0000 g | PACK | Freq: Every day | ORAL | 0 refills | Status: AC

## 2018-04-12 MED ORDER — PRO-STAT SUGAR FREE PO LIQD: 30.0000 mL | Freq: Two times a day (BID) | ORAL | 0 refills | Status: AC

## 2018-04-12 MED ORDER — SPIRONOLACTONE 25 MG PO TABS: 25.0000 mg | ORAL_TABLET | Freq: Every day | ORAL | 1 refills | Status: DC

## 2018-04-12 NOTE — Discharge Instructions (Signed)
Inpatient Rehab Discharge Instructions  Katherine [name] Discharge date and time: 04/12/18   Activities/Precautions/ Functional Status: Activity: no lifting, driving, or strenuous exercise till cleared by MD Diet: cardiac diet and diabetic diet Wound Care: none needed   Functional status:  ___ No restrictions     ___ Walk up steps independently ___ 24/7 supervision/assistance   ___ Walk up steps with assistance _X__ Intermittent supervision/assistance  ___ Bathe/dress independently ___ Walk with walker     ___ Bathe/dress with assistance ___ Walk Independently    ___ Shower independently ___ Walk with assistance    ___ Shower with assistance _X__ No alcohol     ___ Return to work/school ________  Special Instructions: 1. Check blood sugars before meals and at bedtime. Call your doctor with report of results and ask for information on how much insulin to resume.  2. Weight your self daily--see instructions for congestive heart failure.    My questions have been answered and I understand these instructions. I will adhere to these goals and the provided educational materials after my discharge from the hospital.  Patient/Caregiver Signature _______________________________ Date __________  Clinician Signature _______________________________________ Date __________  Please bring this form and your medication list with you to all your follow-up doctor's appointments.

## 2018-04-12 NOTE — Progress Notes (Signed)
Pt. Got d/c instructions,ready to go home with family.

## 2018-04-12 NOTE — Progress Notes (Addendum)
After discussion with [employer] rep, called patient's pharmacy to inform them that brand name "Ranexa" was on Medicaid formulary and does not need prior auth.

## 2018-04-12 NOTE — Progress Notes (Signed)
[facility] PHYSICAL MEDICINE & REHABILITATION PROGRESS NOTE   Subjective/Complaints: Up in chair. Excited about discharge! Feels prepared!  ROS: Patient denies fever, rash, sore throat, blurred vision, nausea, vomiting, diarrhea, cough, shortness of breath or chest pain, joint or back pain, headache, or mood change.   Objective:   No results found. Recent Labs    04/11/18 0535  WBC 5.6  HGB 13.1  HCT 39.8  PLT 403*   Recent Labs    04/11/18 0535 04/12/18 0529  NA 137 141  K 3.6 4.1  CL 102 97*  CO2 24 25  GLUCOSE 116* 110*  BUN 32* 33*  CREATININE 1.77* 1.84*  CALCIUM 9.1 10.0    Intake/Output Summary (Last 24 hours) at 04/12/2018 0941 Last data filed at 04/11/2018 1823 Gross per 24 hour  Intake 340 ml  Output -  Net 340 ml     Physical Exam: Vital Signs Blood pressure 110/70, pulse 80, temperature 98.4 F (36.9 C), temperature source Oral, resp. rate 19, height 6' (1.829 m), weight 97.9 kg, last menstrual period 01/30/2011, SpO2 98 %. Constitutional: No distress . Vital signs reviewed. HEENT: EOMI, oral membranes moist Neck: supple Cardiovascular: RRR without murmur. No JVD    Respiratory: CTA Bilaterally without wheezes or rales. Normal effort    GI: BS +, non-tender, non-distended  Musculoskeletal:Normal range of motion.  General: No deformity.  Comments: BLE with tr    edema   Neurological: She is alertand oriented to person, place, and time. Speech clear. Able to follow basic commands without difficulty. RUE 5/5. LUE 4+/5. RLE 4- to 4/5 prox to distal. LLE 4- to 4/5 prox to distal. No focal sensory loss, cognitively appropriate. Motor/sensory exam stable Skin: Scabs on BL LE healing Psychiatric: pleasant and appropriate    Assessment/Plan: 1. Functional deficits secondary to debility which require 3+ hours per day of interdisciplinary therapy in a comprehensive inpatient rehab setting.  Physiatrist is providing close team supervision  and 24 hour management of active medical problems listed below.  Physiatrist and rehab team continue to assess barriers to discharge/monitor patient progress toward functional and medical goals  Care Tool:  Bathing    Body parts bathed by patient: Right arm, Left arm, Chest, Abdomen, Front perineal area, Right upper leg, Left upper leg, Face, Buttocks, Right lower leg, Left lower leg   Body parts bathed by helper: Buttocks Body parts n/a: Right lower leg, Left lower leg   Bathing assist Assist Level: Supervision/Verbal cueing     Upper Body Dressing/Undressing Upper body dressing   What is the patient wearing?: Pull over shirt    Upper body assist Assist Level: Independent    Lower Body Dressing/Undressing Lower body dressing      What is the patient wearing?: Underwear/pull up, Pants     Lower body assist Assist for lower body dressing: Independent with assitive device     Toileting Toileting    Toileting assist Assist for toileting: Independent with assistive device Assistive Device Comment: Front wheel walker   Transfers Chair/bed transfer  Transfers assist     Chair/bed transfer assist level: Independent with assistive device Chair/bed transfer assistive device: Other(rollator)   Locomotion Ambulation   Ambulation assist      Assist level: Independent with assistive device Assistive device: Rollator Max distance: 150'   Walk 10 feet activity   Assist     Assist level: Independent with assistive device Assistive device: Rollator   Walk 50 feet activity   Assist  Assist level: Independent with assistive device Assistive device: Rollator    Walk 150 feet activity   Assist    Assist level: Independent with assistive device Assistive device: Rollator    Walk 10 feet on uneven surface  activity   Assist Walk 10 feet on uneven surfaces activity did not occur: Safety/medical concerns   Assist level: Supervision/Verbal  cueing Assistive device: Rollator   Wheelchair     Assist Will patient use wheelchair at discharge?: No Type of Wheelchair: Manual    Wheelchair assist level: Supervision/Verbal cueing Max wheelchair distance: 82'    Wheelchair 50 feet with 2 turns activity    Assist        Assist Level: Supervision/Verbal cueing   Wheelchair 150 feet activity     Assist Wheelchair 150 feet activity did not occur: Safety/medical concerns        Medical Problem List and Plan: 1.Functional and mobility deficitssecondary to congestive heart failure and multiple medical issues dc home today  PM&R  follow up in 4 weeks  2. Antithrombotics: -DVT/anticoagulation:Pharmaceutical:Lovenox -antiplatelet therapy: On Plavix daily. 3.Fibromyalgia/Lupus/Pain Management:Tylenol prn. 4. Mood:LCSW to follow for evaluation and support. -antipsychotic agents: N/A 5. Neuropsych: This patientiscapable of making decisions onherown behalf. 6. Skin/Wound Care:Unna boots replaced 3/17. Added ensure max to help with protein stores/ wound healing. 7. Fluids/Electrolytes/Nutrition:Monitor I/O.   Continue 2000cc/FR.      -good intake   8. NICM/VT s/p ICD: On Ranolazine. Reported to have 17 ICD discharges since 01/2018. Amiodarone decreased to 200 mg bid due to tremors. BB on hold due to low output.   -HR controlled 9. SLE: controlled on hydroxychloroquine.  10. Acute renal failure: Serum Creatinine trending back up.     -Cr down to 1.77 today, close to baseline 11. Gout flare: Treated with 3 day course ofprednisone. Continuecolchicine.  12. Acute on chronic systolic CHF: Likely exacerbated by VT.    -Continue spironolactone, digoxin and bidil.   -Torsemide 20 mg daily resumed  -weights stable Filed Weights   04/09/18 0700 04/10/18 0408 04/11/18 0632  Weight: 97.8 kg 96.8 kg 97.9 kg    13. Hypokalemia: 3.6  -continue supplementing  14.  T2DM: Hgb A1C- 6.8.  -SSI only  -sugars remain controlled  -PO intake is 100% currently  -resume 50/50 insulin if indicated on an outpt basis    LOS: 7 days A FACE TO FACE EVALUATION WAS PERFORMED  Ranelle Oyster 04/12/2018, 9:41 AM

## 2018-04-12 NOTE — Discharge Summary (Signed)
Physician Discharge Summary  Patient ID: Katherine [name] MRN: [id number] DOB/AGE: 02/21/1960 58 y.o.  Admit date: 04/05/2018 Discharge date: 04/12/2018  Discharge Diagnoses:  Principal Problem:   Debility Active Problems:   Non-ischemic cardiomyopathy (HCC)   AICD (automatic cardioverter/defibrillator) present   Diabetes mellitus with stage 3 chronic kidney disease (HCC)   Lupus (HCC)   Chronic systolic CHF (congestive heart failure) (HCC)   Acute gout of shoulder   Discharged Condition:  Stable   Significant Diagnostic Studies: N/A    Labs:  Basic Metabolic Panel: Recent Labs  Lab 04/06/18 0913 04/07/18 0732 04/08/18 0509 04/09/18 0532 04/10/18 0605 04/11/18 0535 04/12/18 0529  NA 134* 137 137 137 139 137 141  K 4.1 3.6 3.4* 3.4* 3.5 3.6 4.1  CL 100 100 101 99 100 102 97*  CO2 23 25 22 23 25 24 25   GLUCOSE 172* 113* 113* 124* 124* 116* 110*  BUN 23* 26* 31* 31* 30* 32* 33*  CREATININE 2.09* 2.03* 2.08* 1.86* 1.75* 1.77* 1.84*  CALCIUM 9.3 9.4 9.1 9.5 9.5 9.1 10.0    CBC: CBC Latest Ref Rng & Units 04/11/2018 04/05/2018 04/04/2018  WBC 4.0 - 10.5 K/uL 5.6 7.9 9.7  Hemoglobin 12.0 - 15.0 g/dL 40.9 81.1 91.4  Hematocrit 36.0 - 46.0 % 39.8 40.3 36.8  Platelets 150 - 400 K/uL 403(H) 483(H) 430(H)    CBG: Recent Labs  Lab 04/11/18 0629 04/11/18 1152 04/11/18 1646 04/11/18 2117 04/12/18 0631  GLUCAP 109* 133* 101* 109* 129*   Filed Weights   04/09/18 0700 04/10/18 0408 04/11/18 7829  Weight: 97.8 kg 96.8 kg 97.9 kg    Brief HPI:   Katherine [name] is a 58 year old female with history of SLE, CVA with left HP, systolic HF with NICM/VT s/p ICD, T2DM, CKD who was admitted 03/19/18 to [facility] with nausea vomiting, worsening of dyspnea as well as orthopnea.  GI symptoms felt to be due to amiodarone and dose was adjusted.  She was noted to be markedly volume overloaded but was unable to tolerate diuresis due to acute on chronic renal failure as well as low blood  pressures and was transferred to [facility] on 03/05 for management by heart failure team.  She was placed on dobutamine and was able to tolerate diuresis with resolution of fluid overload.  She has been transitioned to p.o.'s and AKI slowly resolving.  She did have issues with significant left shoulder pain felt to be due to gout flare and was started on steroids for treatment.  Respiratory status and activity tolerance was improving however she was noted to be debilitated.  CIR was recommended due to functional decline    Hospital Course: Donald D Rockwood was admitted to rehab 04/05/2018 for inpatient therapies to consist of PT and OT at least three hours five days a week. Past admission physiatrist, therapy team and rehab RN have worked together to provide customized collaborative inpatient rehab. Left shoulder pain has resolved with 3-day course of steroids and she is to continue on colchicine after discharge.  Heart rate has been stable on amiodarone bid.  Cardiology has been following for input and management of cardiac issues.  Demadex was resumed and titrated up to 80 mg daily.  Serial check of lytes shows renal status to be relatively stable with serum creatinine at 1.84 . BIDIL was increased to 1.5 pills tid to help with better BP control. Peripheral edema has resolved and unna boots were discontinued. Her weight is down to 215  lbs.   Her po intake has improved and protein supplement was added to help with low calorie malnutrition.  Diabetes has been monitored with AC at bedtime CBG checks and blood sugars have been well controlled on diet alone.  She has been educated on importance of dietary discretion as well as need to monitor blood sugars on 4 times daily basis for now.  She is to follow-up with primary MD for input on resumption of insulin past discharge.  Her respiratory status and endurance have greatly improve.  She has made good progress during her rehab stay and is currently at modified  independent level.  She will continue to receive further follow-up home health PT and RN by [facility] after discharge.    Rehab course: During patient's stay in rehab team conference was held monitor patient's progress, set goals and discuss barriers to discharge. At admission, patient required min assist for mobility and basic self care tasks. She  has had improvement in activity tolerance, balance, postural control as well as ability to compensate for deficits. She has progressed to modified independent level and requires either rollator or SPC depending on fatigue level.  She is able to ambulate 200' with RW and requires supervision with cane to climb 4 stairs. Her boyfriend is able to provide assistance as needed after discharge.     Disposition: Home  Diet: Heart healthy/Carb Modified.   Special Instructions: 1. Check weights daily. 2. Monitor BS ac/hs and follow up with Primary MD for input on insulin resumption.    Allergies as of 04/12/2018      Reactions   Aspirin Other (See Comments)   Penicillins Itching   Can take amoxicillin without a reaction   Shellfish Allergy Other (See Comments)   Causes to have grand mal seizures      Medication List    STOP taking these medications   chlorhexidine 0.12 % solution Commonly known as:  PERIDEX   HumaLOG Mix 50/50 KwikPen (50-50) 100 UNIT/ML Kwikpen Generic drug:  Insulin Lispro Prot & Lispro   insulin aspart 100 UNIT/ML injection Commonly known as:  novoLOG   loratadine 10 MG tablet Commonly known as:  CLARITIN   simvastatin 20 MG tablet Commonly known as:  ZOCOR   sodium chloride 0.65 % Soln nasal spray Commonly known as:  OCEAN     TAKE these medications   acetaminophen 325 MG tablet Commonly known as:  TYLENOL Take 1-2 tablets (325-650 mg total) by mouth every 4 (four) hours as needed for mild pain.   albuterol 108 (90 Base) MCG/ACT inhaler Commonly known as:  PROVENTIL HFA;VENTOLIN HFA Inhale 2 puffs  into the lungs 4 (four) times daily as needed. For wheezing and shortness of breath   alum & mag hydroxide-simeth 200-200-20 MG/5ML suspension Commonly known as:  MAALOX/MYLANTA Take 30 mLs by mouth every 6 (six) hours as needed for indigestion or heartburn.   amiodarone 200 MG tablet Commonly known as:  PACERONE Take 1 tablet (200 mg total) by mouth 2 (two) times daily.   cetirizine 10 MG tablet Commonly known as:  ZYRTEC Take 1 tablet (10 mg total) by mouth daily.   clopidogrel 75 MG tablet Commonly known as:  PLAVIX Take 1 tablet (75 mg total) by mouth daily.   COD LIVER OIL PO Take 1 tablet by mouth daily.   colchicine 0.6 MG tablet Take 1 tablet (0.6 mg total) by mouth daily.   Digoxin 62.5 MCG Tabs Take 0.0625 mg by mouth daily.  dimenhyDRINATE 50 MG tablet Commonly known as:  DRAMAMINE Take 0.5 tablets (25 mg total) by mouth every 8 (eight) hours as needed for dizziness.   docusate sodium 100 MG capsule Commonly known as:  COLACE Take 1 capsule (100 mg total) by mouth 2 (two) times daily.   feeding supplement (PRO-STAT SUGAR FREE 64) Liqd Take 30 mLs by mouth 2 (two) times daily.   guaiFENesin 100 MG/5ML Soln Commonly known as:  ROBITUSSIN Take 5 mLs (100 mg total) by mouth every 4 (four) hours as needed for cough or to loosen phlegm.   hydrocortisone cream 0.5 % Apply topically 3 (three) times daily as needed for itching.   hydroxychloroquine 200 MG tablet Commonly known as:  PLAQUENIL Take 1 tablet (200 mg total) by mouth 2 (two) times daily.   isosorbide-hydrALAZINE 20-37.5 MG tablet Commonly known as:  BIDIL Take 1.5  tablet by mouth 3 (three) times daily.   mometasone-formoterol 200-5 MCG/ACT Aero Commonly known as:  DULERA Inhale 2 puffs into the lungs 2 (two) times daily.   MULTIVITAMIN WOMEN 50+ PO Take 1 tablet by mouth daily.   polyethylene glycol packet Commonly known as:  MIRALAX / GLYCOLAX Take 17 g by mouth daily. What changed:     when to take this  reasons to take this   potassium chloride SA 20 MEQ tablet Commonly known as:  K-DUR,KLOR-CON Take 1 tablet (20 mEq total) by mouth daily. Start taking on:  April 13, 2018   ranolazine 500 MG 12 hr tablet Commonly known as:  RANEXA Take 1 tablet (500 mg total) by mouth 2 (two) times daily.   spironolactone 25 MG tablet Commonly known as:  ALDACTONE Take 1 tablet (25 mg total) by mouth daily.   torsemide 20 MG tablet Commonly known as:  DEMADEX Take 4 tablets (80 mg total) by mouth daily.   venlafaxine XR 150 MG 24 hr capsule Commonly known as:  EFFEXOR-XR Take 1 capsule (150 mg total) by mouth daily.      Follow-up Information    Alvina Filbert, MD. Call.   Specialty:  Internal Medicine Why:  with report of your blood sugars and for post hospital follow up Contact information: [address] [place] [address] [phone]        Marinus Maw, MD. Call.   Specialty:  Cardiology Why:  for follow up appointment Contact information: [address] [place] [address] [phone]        Ranelle Oyster, MD Follow up.   Specialty:  Physical Medicine and Rehabilitation Why:  as needed Contact information: [address] [place] [address] [phone]           Signed: Jacquelynn Cree 04/12/2018, 4:36 PM

## 2018-04-13 ENCOUNTER — Ambulatory Visit: Payer: Medicaid Other | Admitting: Occupational Therapy

## 2018-04-13 NOTE — Progress Notes (Signed)
Social Work  Discharge Note  The overall goal for the admission was met for:   Discharge location: Yes- home with boyfriend to stay and assist as needed  Length of Stay: Yes - 7 days  Discharge activity level: Yes - modified independent - supervision  Home/community participation: Yes  Services provided included: MD, RD, PT, OT, RN, Pharmacy and SW  Financial Services: Medicaid  Follow-up services arranged: Home Health: RN, PT via [facility] and Patient/Family has no preference for HH/DME agencies  Comments (or additional information):  Contact info:  Pt @ [phone]       Boyfriend, Landis Gandy @ [phone]  Patient/Family verbalized understanding of follow-up arrangements: Yes  Individual responsible for coordination of the follow-up plan: pt  Confirmed correct DME delivered: NA    Katherine [name]

## 2018-04-13 NOTE — Patient Care Conference (Signed)
Inpatient RehabilitationTeam Conference and Plan of Care Update Date: 04/12/2018   Time: 2:15 PM    Patient Name: Katherine [name]      Medical Record Number: [id number]  Date of Birth: 18-Jul-1960 Sex: Female         Room/Bed: 4W04C/4W04C-01 Payor Info: Payor: MEDICAID [place] / Plan: MEDICAID [place] ACCESS / Product Type: *No Product type* /    Admitting Diagnosis: debility  Admit Date/Time:  04/05/2018  3:51 PM Admission Comments: No comment available   Primary Diagnosis:  Debility Principal Problem: Debility  Patient Active Problem List   Diagnosis Date Noted  . Debility 04/05/2018  . Hypokalemia   . History of CVA with residual deficit   . Diabetes mellitus type 2 in obese ([place])   . Acute gout of shoulder   . Chronic systolic CHF (congestive heart failure) ([place]) 03/24/2018  . CHF (congestive heart failure) ([place]) 03/19/2018  . Morbid obesity due to excess calories ([place]) 09/24/2015  . Personal history of noncompliance with medical treatment, presenting hazards to health 02/19/2015  . Acute on chronic combined systolic and diastolic CHF (congestive heart failure) ([place]) 11/17/2012  . Non-ischemic cardiomyopathy ([place]) 10/03/2012  . AICD (automatic cardioverter/defibrillator) present 10/03/2012  . HTN (hypertension) 10/03/2012  . Diabetes mellitus with stage 3 chronic kidney disease ([place]) 10/03/2012  . Fibromyalgia 10/03/2012  . Dyslipidemia 10/03/2012  . Lupus ([place]) 10/03/2012  . Stroke [facility]) 10/03/2012    Expected Discharge Date: Expected Discharge Date: 04/12/18  Team Members Present: Physician leading conference: Dr. Alger Simons Social Worker Present: Lennart Pall, LCSW Nurse Present: Leonette Nutting, RN PT Present: Other (comment)(Taylor Ervin Knack, PT) OT Present: Amy Rounds, OT SLP Present: Windell Moulding, SLP PPS Coordinator present : Gunnar Fusi     Current Status/Progress Goal Weekly Team Focus  Medical   pneumonia, chf with subsequent debility, making fast gains   improve functional mobility and activity tolerance  finalize medical plan for discharge home   Bowel/Bladder   Continent of bowel and bladder  Continue bathroom habits  Minimal assistance to bathroom.   Swallow/Nutrition/ Hydration             ADL's   Independent  Continue independence  Home   Mobility   mod I overall, gait up to 200' with rollator, Supervision on stairs  set Supervision goals, pt is mod I overall  endurance training, d/c planning, education   Communication             Safety/Cognition/ Behavioral Observations            Pain   Patient c/o mild pain in her legs and head.  To be pain free.  Home   Skin   No skin issues          Rehab Goals Patient on target to meet rehab goals: Yes *See Care Plan and progress notes for long and short-term goals.     Barriers to Discharge  Current Status/Progress Possible Resolutions Date Resolved   Physician                    Nursing                  PT                    OT                  SLP  SW                Discharge Planning/Teaching Needs:  Home with boyfriend to provide any needed assistance.      Team Discussion:  Pt has met mod independent goals overall and ready for d/c today.  Revisions to Treatment Plan:  NA    Continued Need for Acute Rehabilitation Level of Care: The patient requires daily medical management by a physician with specialized training in physical medicine and rehabilitation for the following conditions: Daily direction of a multidisciplinary physical rehabilitation program to ensure safe treatment while eliciting the highest outcome that is of practical value to the patient.: Yes Daily medical management of patient stability for increased activity during participation in an intensive rehabilitation regime.: Yes Daily analysis of laboratory values and/or radiology reports with any subsequent need for medication adjustment of medical intervention for : Pulmonary  problems;Cardiac problems   I attest that I was present, lead the team conference, and concur with the assessment and plan of the team.   Esker Dever 04/13/2018, 12:37 PM

## 2018-04-14 ENCOUNTER — Inpatient Hospital Stay (HOSPITAL_COMMUNITY): Payer: Medicaid Other

## 2018-04-15 ENCOUNTER — Encounter: Payer: Medicaid Other | Admitting: Occupational Therapy

## 2018-04-18 ENCOUNTER — Encounter: Payer: Medicaid Other | Attending: Registered Nurse | Admitting: Registered Nurse

## 2018-04-18 ENCOUNTER — Encounter: Payer: Self-pay | Admitting: Registered Nurse

## 2018-04-18 ENCOUNTER — Other Ambulatory Visit: Payer: Self-pay

## 2018-04-18 DIAGNOSIS — I428 Other cardiomyopathies: Secondary | ICD-10-CM

## 2018-04-18 DIAGNOSIS — E1122 Type 2 diabetes mellitus with diabetic chronic kidney disease: Secondary | ICD-10-CM

## 2018-04-18 DIAGNOSIS — I5022 Chronic systolic (congestive) heart failure: Secondary | ICD-10-CM

## 2018-04-18 DIAGNOSIS — R5381 Other malaise: Secondary | ICD-10-CM

## 2018-04-18 DIAGNOSIS — N183 Chronic kidney disease, stage 3 unspecified: Secondary | ICD-10-CM

## 2018-04-18 DIAGNOSIS — Z95810 Presence of automatic (implantable) cardiac defibrillator: Secondary | ICD-10-CM

## 2018-04-18 DIAGNOSIS — M329 Systemic lupus erythematosus, unspecified: Secondary | ICD-10-CM

## 2018-04-18 DIAGNOSIS — M10012 Idiopathic gout, left shoulder: Secondary | ICD-10-CM

## 2018-04-18 NOTE — Progress Notes (Signed)
Virtual Transitional Care call  Patient name: Katherine [name]  DOB: 1960/04/24 1. Are you/is patient experiencing any problems since coming home? No 2. Are there any questions regarding any aspect of care? No. Are there any questions regarding medications administration/dosing? No a. Are meds being taken as prescribed? Yes b. "Patient should review meds with caller to confirm" Medication List Reviewed.  3. Have there been any falls? No 4. Has Home Health been to the house and/or have they contacted you? Yes, [facility] scheduled to come out today, she reports.  a. If not, have you tried to contact them? NA b. Can we help you contact them? NA 5. Are bowels and bladder emptying properly? Yes a. Are there any unexpected incontinence issues? NA b. If applicable, is patient following bowel/bladder programs? NA 6. Any fevers, problems with breathing, unexpected pain? No 7. Are there any skin problems or new areas of breakdown? No 8. Has the patient/family member arranged specialty MD follow up (ie cardiology/neurology/renal/surgical/etc.)?  [name] has scheduled her HFU appointments.  a. Can we help arrange? NA 9. Does the patient need any other services or support that we can help arrange? No 10. Are caregivers following through as expected in assisting the patient? Yes. 11. Has the patient quit smoking, drinking alcohol, or using drugs as recommended? [name] states she doesn't smoke, drink alcohol or use illicit drugs   Appointment date/time 05/17/2018, arrival time 9:45 for 10:00 appointment with Dr. Riley Kill. At [address] [facility] [address]

## 2018-04-19 ENCOUNTER — Encounter: Payer: Medicaid Other | Admitting: Occupational Therapy

## 2018-04-20 ENCOUNTER — Encounter: Payer: Medicaid Other | Admitting: Internal Medicine

## 2018-04-20 ENCOUNTER — Telehealth: Payer: Self-pay | Admitting: *Deleted

## 2018-04-20 NOTE — Telephone Encounter (Signed)
Elnita Maxwell RN calling to verify that Dr Riley Kill will be signing [facility] orders.  2wkw, 1wk1.  I have confirmed.

## 2018-04-22 ENCOUNTER — Telehealth: Payer: Self-pay

## 2018-04-22 ENCOUNTER — Encounter: Payer: Medicaid Other | Admitting: Occupational Therapy

## 2018-04-22 NOTE — Telephone Encounter (Signed)
Pt called at 5:55pm 04/21/2018 with concerns about Covid-19. Pt states her boyfriend and his whole family has Covid-19 and concerned that she may have it since she has been exposed to it. I told her to call her pcp or do an e-visit thru mychart to get a physicians order. Then she can go to the drive thru tents on [address] or [facility] to get tested. She wanted to know where to go in [place] to get tested. I told her I do not know where to go in [place] to get tested for Covid-19. I advised her to make sure she talked to her pcp or do a e-visit on mychart to get the physicians order again. Pt states she understood.

## 2018-04-26 ENCOUNTER — Encounter: Payer: Medicaid Other | Admitting: Occupational Therapy

## 2018-04-27 ENCOUNTER — Telehealth: Payer: Self-pay | Admitting: Internal Medicine

## 2018-04-27 ENCOUNTER — Ambulatory Visit (INDEPENDENT_AMBULATORY_CARE_PROVIDER_SITE_OTHER): Payer: Medicaid Other | Admitting: *Deleted

## 2018-04-27 DIAGNOSIS — I428 Other cardiomyopathies: Secondary | ICD-10-CM | POA: Diagnosis not present

## 2018-04-27 DIAGNOSIS — I5022 Chronic systolic (congestive) heart failure: Secondary | ICD-10-CM

## 2018-04-27 NOTE — Telephone Encounter (Signed)
Spoke with patient. She is unable to get her monitor to work due to poor signal. She will plan to have someone bring her to the [place] office for an appointment on Monday, 05/02/18 at 11:00am. Advised to call back sooner if any new concerns. Pt verbalizes understanding and thanked me for my call.  Called back and LMOVM with [employer] address and DC phone number.

## 2018-04-27 NOTE — Telephone Encounter (Signed)
Spoke with patient. Her ICD has been beeping again. Pt most recently received alert tone on 03/14/18 for unsuccessful [facility] Alert transmission, she had been shocked appropriately 17 times since 02/08/18 for VT/VF. ICD battery was estimated to have 8.2 years remaining at that check.  Pt reports compliance with all cardiac medications including amiodarone and ranolazine. She denies any dizziness, syncope, or other cardiac symptoms. Reports she is not aware of any recent ICD shocks. Feels that she is doing well. Would need to drive to [facility] or [place] and find somewhere to plug monitor in to transmit as she has no signal at home. Advised against driving anywhere right now due to driving restrictions and Covid. Pt verbalizes understanding. Next availability for in-office check in [place] is on 05/02/18 at the earliest. Routed to [name] and Boneta Lucks, RN for recommendations.

## 2018-04-27 NOTE — Telephone Encounter (Signed)
  1. Has your device fired? NO  2. Is you device beeping? NO  3. Are you experiencing draining or swelling at device site? NO  4. Are you calling to see if we received your device transmission? YES  5. Have you passed out? NO      Please route to Device Clinic Pool  

## 2018-04-27 NOTE — Telephone Encounter (Signed)
LMOVM advising of Dr. Lubertha Basque recommendations. Explained that her device will continue alarming until she sends a transmission (if due to unsuccessful transmission), or until her device is checked in clinic (if due to lead/battery alert). Requested call back to DC.

## 2018-04-27 NOTE — Telephone Encounter (Signed)
Please give pt a call concerning her home transmission from last week, she states it's still beeping.

## 2018-04-27 NOTE — Telephone Encounter (Signed)
Per [name] if Pt states she feels ok she is ok to remain at home.   No action needed unless she has symptoms.

## 2018-04-28 ENCOUNTER — Encounter: Payer: Self-pay | Admitting: *Deleted

## 2018-04-28 ENCOUNTER — Other Ambulatory Visit: Payer: Self-pay

## 2018-04-28 LAB — CUP PACEART REMOTE DEVICE CHECK
Battery Remaining Longevity: 76 mo
Battery Voltage: 2.97 V
Brady Statistic AP VP Percent: 0.85 %
Brady Statistic AP VS Percent: 10.95 %
Brady Statistic AS VP Percent: 0.38 %
Brady Statistic AS VS Percent: 87.81 %
Brady Statistic RA Percent Paced: 11.8 %
Brady Statistic RV Percent Paced: 1.15 %
Date Time Interrogation Session: 20200408185450
HighPow Impedance: 36 Ohm
HighPow Impedance: 45 Ohm
Implantable Lead Implant Date: 20130125
Implantable Lead Implant Date: 20130125
Implantable Lead Location: 753859
Implantable Lead Location: 753860
Implantable Lead Model: 5076
Implantable Lead Model: 6947
Implantable Pulse Generator Implant Date: 20181220
Lead Channel Impedance Value: 266 Ohm
Lead Channel Impedance Value: 342 Ohm
Lead Channel Impedance Value: 380 Ohm
Lead Channel Pacing Threshold Amplitude: 0.625 V
Lead Channel Pacing Threshold Amplitude: 0.75 V
Lead Channel Pacing Threshold Pulse Width: 0.4 ms
Lead Channel Pacing Threshold Pulse Width: 0.4 ms
Lead Channel Sensing Intrinsic Amplitude: 1.5 mV
Lead Channel Sensing Intrinsic Amplitude: 1.5 mV
Lead Channel Sensing Intrinsic Amplitude: 8.375 mV
Lead Channel Sensing Intrinsic Amplitude: 8.375 mV
Lead Channel Setting Pacing Amplitude: 1.5 V
Lead Channel Setting Pacing Amplitude: 2.5 V
Lead Channel Setting Pacing Pulse Width: 0.4 ms
Lead Channel Setting Sensing Sensitivity: 0.6 mV

## 2018-04-28 NOTE — Telephone Encounter (Signed)
Transmission sent on 04/27/18 at 1454 was finally received. Alert tone was due to appropriate shock for VF on 04/17/18 at 1159. 5 nonsustained polymorphic VT episodes also noted on that date. Remote transmission added to schedule for processing.   Will route to [name] for recommendations as patient confirmed compliance with cardiac meds during our phone call yesterday.

## 2018-04-28 NOTE — Telephone Encounter (Signed)
Spoke with pt to cancel DC appointment for 4/13 as it is no longer necessary. Pt aware that appointment has been canceled.  Pt reports she was asleep all day on 3/29 and didn't notice her ICD shock. She reports that her pharmacy called her yesterday to review her medications and she discovered that she was taking both Bidil and hydralazine 10mg  tablets that she had at home. She has since stopped taking the extra hydralazine as instructed. Has HH RN to help with medications as well. Reviewed other cardiac meds--pt reports compliance and no missed doses. Advised of [place] DMV driving restrictions x6 months and pt verbalizes understanding. She is aware we will call back if [name] has additional recommendations.

## 2018-04-28 NOTE — Telephone Encounter (Signed)
Opened in error

## 2018-04-29 ENCOUNTER — Encounter: Payer: Medicaid Other | Admitting: Occupational Therapy

## 2018-05-03 ENCOUNTER — Encounter: Payer: Medicaid Other | Admitting: Occupational Therapy

## 2018-05-04 ENCOUNTER — Telehealth: Payer: Self-pay | Admitting: Internal Medicine

## 2018-05-04 DIAGNOSIS — M797 Fibromyalgia: Secondary | ICD-10-CM

## 2018-05-04 DIAGNOSIS — M329 Systemic lupus erythematosus, unspecified: Secondary | ICD-10-CM

## 2018-05-04 DIAGNOSIS — I5023 Acute on chronic systolic (congestive) heart failure: Secondary | ICD-10-CM

## 2018-05-04 DIAGNOSIS — E1122 Type 2 diabetes mellitus with diabetic chronic kidney disease: Secondary | ICD-10-CM | POA: Diagnosis not present

## 2018-05-04 DIAGNOSIS — E669 Obesity, unspecified: Secondary | ICD-10-CM

## 2018-05-04 DIAGNOSIS — F419 Anxiety disorder, unspecified: Secondary | ICD-10-CM

## 2018-05-04 DIAGNOSIS — I130 Hypertensive heart and chronic kidney disease with heart failure and stage 1 through stage 4 chronic kidney disease, or unspecified chronic kidney disease: Secondary | ICD-10-CM | POA: Diagnosis not present

## 2018-05-04 DIAGNOSIS — N183 Chronic kidney disease, stage 3 (moderate): Secondary | ICD-10-CM

## 2018-05-04 DIAGNOSIS — I429 Cardiomyopathy, unspecified: Secondary | ICD-10-CM

## 2018-05-04 DIAGNOSIS — Z7902 Long term (current) use of antithrombotics/antiplatelets: Secondary | ICD-10-CM

## 2018-05-04 DIAGNOSIS — Z950 Presence of cardiac pacemaker: Secondary | ICD-10-CM

## 2018-05-04 DIAGNOSIS — Z794 Long term (current) use of insulin: Secondary | ICD-10-CM

## 2018-05-04 DIAGNOSIS — Z87891 Personal history of nicotine dependence: Secondary | ICD-10-CM

## 2018-05-04 DIAGNOSIS — E785 Hyperlipidemia, unspecified: Secondary | ICD-10-CM

## 2018-05-04 NOTE — Telephone Encounter (Signed)
Patient called stating that her Defibrillator continues to go off.

## 2018-05-04 NOTE — Telephone Encounter (Signed)
Pt hearing alert tone daily and it is bothering her. Reviewed that we can't turn that tone off remotely.  She would like to have it turned off sooner rather than later. Appt made for Monday at 8:30AM with device clinic  Gypsy Balsam, NP 05/04/2018 12:19 PM

## 2018-05-04 NOTE — Telephone Encounter (Signed)
LMOVM requesting call back to DC. Gave direct number for return call. 

## 2018-05-06 ENCOUNTER — Encounter: Payer: Medicaid Other | Admitting: Occupational Therapy

## 2018-05-06 NOTE — Progress Notes (Signed)
Remote ICD transmission.   

## 2018-05-09 ENCOUNTER — Other Ambulatory Visit: Payer: Self-pay

## 2018-05-09 ENCOUNTER — Ambulatory Visit (INDEPENDENT_AMBULATORY_CARE_PROVIDER_SITE_OTHER): Payer: Medicaid Other | Admitting: Student

## 2018-05-09 DIAGNOSIS — I5022 Chronic systolic (congestive) heart failure: Secondary | ICD-10-CM

## 2018-05-09 DIAGNOSIS — I472 Ventricular tachycardia, unspecified: Secondary | ICD-10-CM

## 2018-05-09 DIAGNOSIS — I428 Other cardiomyopathies: Secondary | ICD-10-CM | POA: Diagnosis not present

## 2018-05-09 LAB — CUP PACEART INCLINIC DEVICE CHECK
Battery Remaining Longevity: 72 mo
Battery Voltage: 2.98 V
Brady Statistic AP VP Percent: 1.4 %
Brady Statistic AP VS Percent: 22.68 %
Brady Statistic AS VP Percent: 0.45 %
Brady Statistic AS VS Percent: 75.47 %
Brady Statistic RA Percent Paced: 23.75 %
Brady Statistic RV Percent Paced: 1.74 %
Date Time Interrogation Session: 20200420092941
HighPow Impedance: 40 Ohm
HighPow Impedance: 49 Ohm
Implantable Lead Implant Date: 20130125
Implantable Lead Implant Date: 20130125
Implantable Lead Location: 753859
Implantable Lead Location: 753860
Implantable Lead Model: 5076
Implantable Lead Model: 6947
Implantable Pulse Generator Implant Date: 20181220
Lead Channel Impedance Value: 285 Ohm
Lead Channel Impedance Value: 380 Ohm
Lead Channel Impedance Value: 399 Ohm
Lead Channel Pacing Threshold Amplitude: 0.625 V
Lead Channel Pacing Threshold Amplitude: 0.625 V
Lead Channel Pacing Threshold Pulse Width: 0.4 ms
Lead Channel Pacing Threshold Pulse Width: 0.4 ms
Lead Channel Sensing Intrinsic Amplitude: 1 mV
Lead Channel Sensing Intrinsic Amplitude: 1 mV
Lead Channel Sensing Intrinsic Amplitude: 8.875 mV
Lead Channel Sensing Intrinsic Amplitude: 8.875 mV
Lead Channel Setting Pacing Amplitude: 1.5 V
Lead Channel Setting Pacing Amplitude: 2.5 V
Lead Channel Setting Pacing Pulse Width: 0.4 ms
Lead Channel Setting Sensing Sensitivity: 0.6 mV

## 2018-05-09 NOTE — Progress Notes (Signed)
ICD check in clinic. Normal device function. Thresholds and sensing consistent with previous device measurements. Impedance trends stable over time. No new ventricular arrhythmias since last assessment 04/17/18. No mode switches. Histogram distribution appropriate for patient and level of activity. Max A. Pacing Lead Impedance changed from 1000 ohms to 2000 ohms. Max RV Lead Impedance changed from 1000 ohms to 2000 ohms. Alert time changed from 0800 to 2000. Device programmed at appropriate safety margins. Device programmed to optimize intrinsic conduction. Estimated longevity 6 years. Pt enrolled in remote follow-up. Plan to check device every 3 months remotely and in office annually. Patient education completed including shock plan. Alert tones/vibration demonstrated for patient.  Pt noted to have increased optivol; decreased thoracic impedence. Patient states she has been taking Lasix 80 mg daily AND torsemide 80 mg daily. Instructed to STOP lasix and continue torsemide as directed. Instructed to take extra 40 mg of torsemide in the afternoon as needed. Will contact HF clinic about follow up.   Casimiro Needle [address]" [place], PA-C 05/09/2018 9:31 AM

## 2018-05-09 NOTE — Patient Instructions (Signed)
We will contact the [facility] to make follow up for you.   If you do not hear from them, please call their office at [phone].   STOP Furosemide (lasix)  CONTINUE Torsemide (Demadex) 80 mg once daily.

## 2018-05-10 ENCOUNTER — Encounter: Payer: Medicaid Other | Admitting: Occupational Therapy

## 2018-05-11 DIAGNOSIS — E785 Hyperlipidemia, unspecified: Secondary | ICD-10-CM

## 2018-05-11 DIAGNOSIS — E1122 Type 2 diabetes mellitus with diabetic chronic kidney disease: Secondary | ICD-10-CM | POA: Diagnosis not present

## 2018-05-11 DIAGNOSIS — I429 Cardiomyopathy, unspecified: Secondary | ICD-10-CM | POA: Diagnosis not present

## 2018-05-11 DIAGNOSIS — Z950 Presence of cardiac pacemaker: Secondary | ICD-10-CM

## 2018-05-11 DIAGNOSIS — F419 Anxiety disorder, unspecified: Secondary | ICD-10-CM

## 2018-05-11 DIAGNOSIS — Z794 Long term (current) use of insulin: Secondary | ICD-10-CM

## 2018-05-11 DIAGNOSIS — I130 Hypertensive heart and chronic kidney disease with heart failure and stage 1 through stage 4 chronic kidney disease, or unspecified chronic kidney disease: Secondary | ICD-10-CM | POA: Diagnosis not present

## 2018-05-11 DIAGNOSIS — M329 Systemic lupus erythematosus, unspecified: Secondary | ICD-10-CM

## 2018-05-11 DIAGNOSIS — M797 Fibromyalgia: Secondary | ICD-10-CM

## 2018-05-11 DIAGNOSIS — Z7902 Long term (current) use of antithrombotics/antiplatelets: Secondary | ICD-10-CM

## 2018-05-11 DIAGNOSIS — N183 Chronic kidney disease, stage 3 (moderate): Secondary | ICD-10-CM

## 2018-05-11 DIAGNOSIS — Z87891 Personal history of nicotine dependence: Secondary | ICD-10-CM

## 2018-05-11 DIAGNOSIS — E669 Obesity, unspecified: Secondary | ICD-10-CM

## 2018-05-11 DIAGNOSIS — I5023 Acute on chronic systolic (congestive) heart failure: Secondary | ICD-10-CM | POA: Diagnosis not present

## 2018-05-13 ENCOUNTER — Encounter: Payer: Medicaid Other | Admitting: Occupational Therapy

## 2018-05-13 ENCOUNTER — Other Ambulatory Visit: Payer: Self-pay | Admitting: Internal Medicine

## 2018-05-16 ENCOUNTER — Other Ambulatory Visit: Payer: Self-pay

## 2018-05-16 ENCOUNTER — Ambulatory Visit (HOSPITAL_COMMUNITY)
Admission: RE | Admit: 2018-05-16 | Discharge: 2018-05-16 | Disposition: A | Discharge status: Home / Self Care | Payer: Medicaid Other | Source: Ambulatory Visit | Attending: Cardiology | Admitting: Cardiology

## 2018-05-16 ENCOUNTER — Telehealth (HOSPITAL_COMMUNITY): Payer: Self-pay | Admitting: *Deleted

## 2018-05-16 DIAGNOSIS — I5022 Chronic systolic (congestive) heart failure: Secondary | ICD-10-CM

## 2018-05-16 MED ORDER — TORSEMIDE 20 MG PO TABS: ORAL_TABLET | ORAL | 1 refills | Status: DC

## 2018-05-16 MED ORDER — COLCHICINE 0.6 MG PO TABS: 0.6000 mg | ORAL_TABLET | Freq: Every day | ORAL | 0 refills | Status: DC

## 2018-05-16 MED ORDER — ISOSORB DINITRATE-HYDRALAZINE 20-37.5 MG PO TABS: 2.0000 | ORAL_TABLET | Freq: Three times a day (TID) | ORAL | 3 refills | Status: DC

## 2018-05-16 MED ORDER — POTASSIUM CHLORIDE CRYS ER 20 MEQ PO TBCR: EXTENDED_RELEASE_TABLET | ORAL | 1 refills | Status: DC

## 2018-05-16 MED ORDER — RANOLAZINE ER 500 MG PO TB12: 500.0000 mg | ORAL_TABLET | Freq: Two times a day (BID) | ORAL | 1 refills | Status: DC

## 2018-05-16 NOTE — Progress Notes (Signed)
Left VM for pt to call back to go over AVS and to schedule telehealth 3week follow up.   AVS and lab order mailed to patient.

## 2018-05-16 NOTE — Patient Instructions (Addendum)
Dr.McLean would like paramedicine to see you to help with medications. Message sent to Nurse navigator to see if this is an option since you live in [place]. We will call you to set up appointment if available.  Increase Bidil 2 tabs tid.   Increase torsemide to 80 mg every morning and 40 mg every OTHER afternoon.    Increase KCl to 20 mEq every morning and 20 mg every OTHER afternoon.    Needs telemedicine followup in 3 wks.   CMET, digoxin level, and TSH needs to be drawn. Please take attached prescription to a lab near you and have them fax results to [phone].

## 2018-05-16 NOTE — Telephone Encounter (Signed)
Dr.McLean paramedicine in for a patient in [place] is not an option at this time. Also pt can not have skilled nursing unless she is homebound.

## 2018-05-16 NOTE — Telephone Encounter (Signed)
-----   Message from Crissie Figures, RN sent at 05/16/2018  2:49 PM EDT ----- Regarding: RE: paramedicine Not an option at this time to my knowledge ----- Message ----- From: Modesta Messing, CMA Sent: 05/16/2018   2:46 PM EDT To: Crissie Figures, RN Subject: paramedicine                                   Dr.McLean wants pt to be followed by paramedicine but patient lives in [place]. He told me to ask if this was an option or not. Thanks

## 2018-05-16 NOTE — Progress Notes (Signed)
Heart Failure TeleHealth Note  Due to national recommendations of social distancing due to COVID 19, Audio/video telehealth visit is felt to be most appropriate for this patient at this time.  See MyChart message from today for patient consent regarding [facility]health [facility].  Date:  05/16/2018   ID:  Katherine [name], DOB 1960/02/14, MRN [id number]  Location: Home  Provider location: [place] [facility] Type of Visit: Established patient   PCP:  Alvina FilbertHunter, Denise, MD  Cardiologist:  Lewayne BuntingGregg Taylor, MD Primary HF: Dr. Shirlee LatchMcLean  Chief Complaint: Fatigue   History of Present Illness: Katherine [name] is a 58 y.o. female who presents via audio/video conferencing for a telehealth visit today.     she denies symptoms worrisome for COVID 19.   Patient has nonischemic cardiomyopathy, CVA, and VT.  Patient has long history of NICM, diagnosed in 2012.  At that time, cath showed no coronary disease and cardiac MRI showed no infiltrative disease (no LGE) with EF 28%.  Echo in 12/19 showed moderate LV dilation with EF 20-25%, mildly decreased RV systolic function.    Patient was [facility] to [facility] in 3/20 with dyspnea, edema, and nausea.  AKI noted.  Device interrogation showed 17 appropriate shocks by ICD since 02/08/18.  She was found to have low output HF.  Dobutamine gtt was started and she was diuresed.    Recently, she has noted fatigue walking around the grocery store.  She can walk for about 15 minutes then gets tired and has to rest.  Weight is up 5 lbs since discharge.  She says that she is eating better.  No lightheadedness or palpitations.  No chest pain, orthopnea, PND.   Medtronic device interrogation: 1 shock in 3/20 for VT.  Fluid index > threshold with decreased impedance, rare V-pacing.   Labs (3/20): digoxin 0.7, K 4.1, creatinine 1.8.   Past Medical History: 1. Chronic systolic CHF: Nonischemic cardiomyopathy.  Medtronic ICD.  - Cath in 11/12 with normal  coronaries.  - Cardiac MRI 12/12 with no evidence for infiltrative disease (no LGE), EF 28% with moderate LV dilation.  - Echo (12/19): EF 20-25%, moderate LV dilation, mild MR, mildly decreased RV systolic function.  2. H/o CVA: On Plavix.  3. HTN 4. Type 2 diabetes. 5. Hyperlipidemia 6. SLE: On hydroxychloroquine.  7. H/o VT/ICD shocks.  8. Gout 9. CKD: Stage 3.   Current Outpatient Medications  Medication Sig Dispense Refill  . acetaminophen (TYLENOL) 325 MG tablet Take 1-2 tablets (325-650 mg total) by mouth every 4 (four) hours as needed for mild pain.    Marland Kitchen. albuterol (PROVENTIL HFA;VENTOLIN HFA) 108 (90 BASE) MCG/ACT inhaler Inhale 2 puffs into the lungs 4 (four) times daily as needed. For wheezing and shortness of breath    . alum & mag hydroxide-simeth (MAALOX/MYLANTA) 200-200-20 MG/5ML suspension Take 30 mLs by mouth every 6 (six) hours as needed for indigestion or heartburn. 355 mL 0  . Amino Acids-Protein Hydrolys (FEEDING SUPPLEMENT, PRO-STAT SUGAR FREE 64,) LIQD Take 30 mLs by mouth 2 (two) times daily. 887 mL 0  . amiodarone (PACERONE) 200 MG tablet Take 1 tablet (200 mg total) by mouth 2 (two) times daily. 60 tablet 1  . cetirizine (ZYRTEC) 10 MG tablet Take 1 tablet (10 mg total) by mouth daily. 30 tablet 0  . clopidogrel (PLAVIX) 75 MG tablet Take 1 tablet (75 mg total) by mouth daily. 30 tablet 0  . COD LIVER OIL PO Take 1  tablet by mouth daily.     . colchicine 0.6 MG tablet Take 1 tablet (0.6 mg total) by mouth daily. 30 tablet 0  . Digoxin 62.5 MCG TABS Take 0.0625 mg by mouth daily. 30 tablet 1  . dimenhyDRINATE (DRAMAMINE) 50 MG tablet Take 0.5 tablets (25 mg total) by mouth every 8 (eight) hours as needed for dizziness. 30 tablet 0  . docusate sodium (COLACE) 100 MG capsule Take 1 capsule (100 mg total) by mouth 2 (two) times daily. 60 capsule 0  . guaiFENesin (ROBITUSSIN) 100 MG/5ML SOLN Take 5 mLs (100 mg total) by mouth every 4 (four) hours as needed for cough or  to loosen phlegm. 236 mL 0  . hydrocortisone cream 0.5 % Apply topically 3 (three) times daily as needed for itching. 30 g 0  . hydroxychloroquine (PLAQUENIL) 200 MG tablet Take 1 tablet (200 mg total) by mouth 2 (two) times daily. 60 tablet 1  . isosorbide-hydrALAZINE (BIDIL) 20-37.5 MG tablet Take 2 tablets by mouth 3 (three) times daily. 180 tablet 3  . mometasone-formoterol (DULERA) 200-5 MCG/ACT AERO Inhale 2 puffs into the lungs 2 (two) times daily. 8.8 g 3  . Multiple Vitamins-Minerals (MULTIVITAMIN WOMEN 50+ PO) Take 1 tablet by mouth daily.    . polyethylene glycol (MIRALAX / GLYCOLAX) packet Take 17 g by mouth daily. 14 each 0  . potassium chloride SA (K-DUR) 20 MEQ tablet Take every morning and every other afternoon. 45 tablet 1  . ranolazine (RANEXA) 500 MG 12 hr tablet Take 1 tablet (500 mg total) by mouth 2 (two) times daily. 60 tablet 1  . spironolactone (ALDACTONE) 25 MG tablet Take 1 tablet (25 mg total) by mouth daily. 30 tablet 1  . torsemide (DEMADEX) 20 MG tablet Take 80mg  every morning and 40mg  every other afternoon. 120 tablet 1  . venlafaxine XR (EFFEXOR-XR) 150 MG 24 hr capsule Take 1 capsule (150 mg total) by mouth daily. 30 capsule 1   No current facility-administered medications for this encounter.     Allergies:   Aspirin; Penicillins; and Shellfish allergy   Social History:  The patient  reports that she quit smoking about 12 years ago. Her smoking use included cigarettes. She started smoking about 38 years ago. She has a 26.00 pack-year smoking history. She has never used smokeless tobacco. She reports previous alcohol use. She reports that she does not use drugs.   Family History:  The patient's family history includes Diabetes in her father; Heart failure in her mother; Hypertension in her brother, daughter, and sister.   ROS:  Please see the history of present illness.   All other systems are personally reviewed and negative.   Exam:  (Video/Tele  Health Call; Exam is subjective and or/visual.) General:  Speaks in full sentences. No resp difficulty. Neck: JVP difficult Lungs: Normal respiratory effort with conversation.  Abdomen: Non-distended per patient report Extremities: Pt denies edema. Neuro: Alert & oriented x 3.   Recent Labs: 03/24/2018: B Natriuretic Peptide 893.2 03/25/2018: ALT 26 03/31/2018: Magnesium 2.5 04/11/2018: Hemoglobin 13.1; Platelets 403 04/12/2018: BUN 33; Creatinine, Ser 1.84; Potassium 4.1; Sodium 141  Personally reviewed   Wt Readings from Last 3 Encounters:  04/11/18 97.9 kg (215 lb 13.3 oz)  04/05/18 101.1 kg (222 lb 14.2 oz)  03/24/18 (!) 142.7 kg (314 lb 8 oz)      ASSESSMENT AND PLAN:  1. Chronic systolic CHF: Nonischemic cardiomyopathy. Cardiac MRI in 12/12 with no LGE. Echo in 12/19 with  EF 20-25%, mildly decreased RV systolic function. Medtronic ICD. Episode of low output HF requiring dobutamine in 3/20 but able to be titrated off.  Weight is up and Optivol suggests fluid overload.   - Increase torsemide to 80 qam/40 qpm and KCl to 20 mEq bid.  I will arrange for BMET.  - Continue spironolactone 25 mg daily  - Continue low dose digoxin. check level.   - Increase Bidil to 2 tabs tid.  2. CKD: Stage 3.  Increasing torsemide and will check BMET as above.  3. VT: Patient had 17 episodes of VT with ICD discharges since 1/20. Suspect that CHF was trigger of VT. She had another episode in 3/20.  - Continue amiodarone 200 mg BID for now. Check LFTs and TSH.  She will need a regular eye exam given amiodarone use.  -Continueranolazine, needs refill.  -No BBwith low output.  4. H/o CVA: On Plavix.  5. H/o SLE: On hydroxychloroquine.  6. Gout: Continue colchicine.   COVID screen The patient does not have any symptoms that suggest any further testing/ screening at this time.  Social distancing reinforced today.  Patient Risk: After full review of this patients clinical status, I feel that they  are at moderate risk for cardiac decompensation at this time.  Relevant cardiac medications were reviewed at length with the patient today. The patient does not have concerns regarding their medications at this time.   Recommended follow-up:  telehealth in 3 wks.   Today, I have spent 21 minutes with the patient with telehealth technology discussing the above issues .    Signed, Marca Ancona, MD  05/16/2018 11:52 PM  [facility] [address] Heart and Vascular [place] [address] [phone] (office) [phone] (fax)

## 2018-05-17 ENCOUNTER — Encounter: Payer: Medicaid Other | Admitting: Occupational Therapy

## 2018-05-17 ENCOUNTER — Encounter: Payer: Self-pay | Admitting: Physical Medicine & Rehabilitation

## 2018-05-17 ENCOUNTER — Encounter: Payer: Medicaid Other | Attending: Registered Nurse | Admitting: Physical Medicine & Rehabilitation

## 2018-05-17 ENCOUNTER — Other Ambulatory Visit: Payer: Self-pay

## 2018-05-17 VITALS — Ht 71.0 in | Wt 230.0 lb

## 2018-05-17 DIAGNOSIS — R5381 Other malaise: Secondary | ICD-10-CM | POA: Diagnosis not present

## 2018-05-17 DIAGNOSIS — E1122 Type 2 diabetes mellitus with diabetic chronic kidney disease: Secondary | ICD-10-CM | POA: Diagnosis not present

## 2018-05-17 DIAGNOSIS — M329 Systemic lupus erythematosus, unspecified: Secondary | ICD-10-CM | POA: Diagnosis not present

## 2018-05-17 DIAGNOSIS — N183 Chronic kidney disease, stage 3 unspecified: Secondary | ICD-10-CM

## 2018-05-17 NOTE — Progress Notes (Signed)
Subjective:    Patient ID: Katherine [name], female    DOB: 1960/03/23, 58 y.o.   MRN: [id number]  HPI   Due to national recommendations of social distancing because of COVID 69, an audio/video tele-health visit is felt to be the most appropriate encounter for this patient at this time. See MyChart message from today for the patient's consent to a tele-health encounter with [facility]. This is a follow up telephone visit for the patient who is at home. MD is at office.    I am meeting with the patient today regarding debility and recent inpatient rehab stay. She was discharged on 3/24.  She is using a cane occasionally at home but otherwise she walks without a device. She received HH initially but it was short lived due to the fact that she was doing well. She is able to perform household chores now on her own.   She has an occasional ache when the weather cools or if it's damp. She had some issues with the doses of some of her CV meds initially but since has straightened that out. She maintains follow up with cardiology.   Has had a gout flare or two since she's been home   Pain Inventory Average Pain 0 Pain Right Now 0 My pain is no pain  In the last 24 hours, has pain interfered with the following? General activity 0 Relation with others 0 Enjoyment of life 0 What TIME of day is your pain at its worst? no pain Sleep (in general) Good  Pain is worse with: no pain Pain improves with: no pain Relief from Meds: no pain  Mobility walk without assistance walk with assistance use a cane  Function disabled: date disabled .  Neuro/Psych weakness tremor trouble walking  Prior Studies hospital f/u  Physicians involved in your care hospital f/u   Family History  Problem Relation Age of Onset  . Heart failure Mother   . Diabetes Father   . Hypertension Sister   . Hypertension Brother   . Hypertension Daughter    Social History    Socioeconomic History  . Marital status: Legally Separated    Spouse name: Not on file  . Number of children: Not on file  . Years of education: Not on file  . Highest education level: Not on file  Occupational History  . Not on file  Social Needs  . Financial resource strain: Not very hard  . Food insecurity:    Worry: Never true    Inability: Never true  . Transportation needs:    Medical: No    Non-medical: No  Tobacco Use  . Smoking status: Former Smoker    Packs/day: 1.00    Years: 26.00    Pack years: 26.00    Types: Cigarettes    Start date: 07/01/1979    Last attempt to quit: 10/03/2005    Years since quitting: 12.6  . Smokeless tobacco: Never Used  Substance and Sexual Activity  . Alcohol use: Not Currently    Alcohol/week: 0.0 standard drinks    Comment: quit 2007  . Drug use: No  . Sexual activity: Yes  Lifestyle  . Physical activity:    Days per week: Patient refused    Minutes per session: Patient refused  . Stress: Only a little  Relationships  . Social connections:    Talks on phone: Patient refused    Gets together: Patient refused    Attends religious service:  Patient refused    Active member of club or organization: Patient refused    Attends meetings of clubs or organizations: Patient refused    Relationship status: Patient refused  Other Topics Concern  . Not on file  Social History Narrative  . Not on file   Past Surgical History:  Procedure Laterality Date  . ICD  02/13/2011   Medtronic Protecta DR XT  . ICD GENERATOR CHANGEOUT N/A 01/07/2017   Procedure: ICD GENERATOR CHANGEOUT;  Surgeon: Marinus Mawaylor, Gregg W, MD;  [facility];  Service: Cardiovascular;  Laterality: N/A;  . IMPLANTABLE CARDIOVERTER DEFIBRILLATOR IMPLANT N/A 02/13/2011   Procedure: IMPLANTABLE CARDIOVERTER DEFIBRILLATOR IMPLANT;  Surgeon: Thurmon FairMihai Croitoru, MD;  Location: [facility] CATH LAB;  Service: Cardiovascular;  Laterality: N/A;  . LEFT HEART CATHETERIZATION WITH  CORONARY ANGIOGRAM N/A 12/05/2010   Procedure: LEFT HEART CATHETERIZATION WITH CORONARY ANGIOGRAM;  Surgeon: Chrystie NoseKenneth C. Hilty, MD;  [facility];  Service: Cardiovascular;  Laterality: N/A;  . RIGHT ANKLE    . TUBAL LIGATION    . US ECHOCARDIOGRAPHY  11/09/2010   mild LV enlargement w/conc. LVH & severe global hypokinesis EF 30-35%,mod. diastolic dysfunction, mod. TR,mild AI,mild to mod MR,mild PI   Past Medical History:  Diagnosis Date  . Anxiety   . Arthritis   . Asthma   . Bronchitis   . CHF (congestive heart failure) (HCC)    a. EF 20-25% by cath in 11/2010 with cath showing normal cors  . CKD (chronic kidney disease) stage 3, GFR 30-59 ml/min (HCC)   . Fibromyalgia   . GERD (gastroesophageal reflux disease)   . Hallux valgus of left foot   . Hay fever   . Headache(784.0)   . History of pneumonia   . History of seizures   . Hyperlipidemia   . Hypertension   . Nonischemic cardiomyopathy (HCC)   . Obesity   . Stroke (HCC)   . Systemic lupus erythematosus (HCC)   . Type 2 diabetes mellitus (HCC)    Ht 5\' 11"  (1.803 m)   Wt 230 lb (104.3 kg)   LMP 01/30/2011   BMI 32.08 kg/m   Opioid Risk Score:   Fall Risk Score:  `1  Depression screen PHQ 2/9  Depression screen [facility] 2/9 09/24/2015 04/04/2015 03/07/2015 02/19/2015 11/14/2014  Decreased Interest 0 0 0 0 0  Down, Depressed, Hopeless 0 0 0 0 0  PHQ - 2 Score 0 0 0 0 0    Review of Systems  Constitutional: Negative.   HENT: Negative.   Eyes: Negative.   Respiratory: Negative.   Gastrointestinal: Positive for diarrhea.  Endocrine: Negative.   Skin: Negative.   Allergic/Immunologic: Negative.   Neurological: Positive for tremors and weakness.  Psychiatric/Behavioral: Negative.        Assessment & Plan:  1.Functional and mobility deficitssecondary to congestive heart failure and multiple medical issues  -continue HEP  -walking without a device. Close to functional baseline 2.  Antithrombotics: -antiplatelet therapy: On Plavix daily. 3.Fibromyalgia/Lupus/Pain Management:Tylenol prn.  -occasional aches with damp weather or when she's more physically active 4. . NICM/VT s/p ICD: On Ranolazine. amiodarone  -cards following 5. SLE: controlled on hydroxychloroquine.  6. Chronic renal failure:  Per primary 7 Gout flare: has had flare ups on occasion.   8. Acute on chronic systolic CHF: Likely exacerbated by VT.               -Continue spironolactone, digoxin and bidil.              -  Torsemide 20 mg daily resumed  9. T2DM: Hgb A1C- 6.8.             -diet controlled    9 minutes of tele-visit time was spent with this patient today. Follow up in as needed

## 2018-05-20 ENCOUNTER — Encounter: Payer: Medicaid Other | Admitting: Occupational Therapy

## 2018-05-23 ENCOUNTER — Telehealth (HOSPITAL_COMMUNITY): Payer: Self-pay

## 2018-05-23 MED ORDER — DIGOXIN 62.5 MCG PO TABS: 0.0625 mg | ORAL_TABLET | Freq: Every day | ORAL | 5 refills | Status: DC

## 2018-05-23 MED ORDER — TORSEMIDE 20 MG PO TABS: ORAL_TABLET | ORAL | 5 refills | Status: DC

## 2018-05-23 MED ORDER — SPIRONOLACTONE 25 MG PO TABS: 25.0000 mg | ORAL_TABLET | Freq: Every day | ORAL | 5 refills | Status: DC

## 2018-05-23 MED ORDER — AMIODARONE HCL 200 MG PO TABS: 200.0000 mg | ORAL_TABLET | Freq: Two times a day (BID) | ORAL | 5 refills | Status: DC

## 2018-05-23 NOTE — Telephone Encounter (Signed)
Called patients pharmacy at [facility], discussed bubble wrapping medicines.  Pharmacist noted that patient was scheduled to have meds pre wrapped starting May 18th. Heart failure medications reviewed with pharmacist.  Medications that needed refills were sent.  Torsemide that was change at last virtual visit did not go to pharmacy as it was set to print.  That also sent to pharmacy.  Pt aware of same and appreciative. Advised patient to take her med list from AVS to pharmacist to do another comparison to ensure all meds are correct. Pt verbalized understanding.

## 2018-05-23 NOTE — Telephone Encounter (Signed)
Received call from RN Rose Fillers as she is at patients home doing med reconciliation.  Medications reviewed and confirmed with Dr. Shirlee Latch as correct on med list.  Rose Fillers communicated that to patient.  Will inquire about pharmacy bubble wrapping daily meds.  Advised will update patient accordingly.

## 2018-05-23 NOTE — Progress Notes (Signed)
Home visit for medication reconciliation w/Ms. Katherine [name].  She states she feels fine and has no concerns otherwise.   Her HF clinic Rx'd medications were reviewed along w/[name], RN w/HF clinic who was able to make clarifications w/Dr. Shirlee [name].  Understanding stated by Katherine [name] who stated she felt much better after going through them. She had a concern about a few other medications that were Rx'd by her PMD and she states she will call her PMD for clarification.  She also would like her medications to be in bubble packs which is going to be followed up on by Katherine Center, RN who will be in contact w/Katherine [name] when she finds out the process.  Katherine [name] knows to call the HF clinic w/any concerns.

## 2018-05-24 ENCOUNTER — Encounter: Payer: Medicaid Other | Admitting: Occupational Therapy

## 2018-05-25 ENCOUNTER — Telehealth: Payer: Self-pay | Admitting: Internal Medicine

## 2018-05-25 NOTE — Telephone Encounter (Signed)
Please give pt a call concerning her device.

## 2018-05-25 NOTE — Telephone Encounter (Signed)
Left message for patient to call - options are to add on to today's device clinic schedule or send a remote check from home  Katherine Balsam, NP 05/25/2018 10:15 AM

## 2018-05-25 NOTE — Telephone Encounter (Signed)
Follow up: ° ° ° °Patient returning your call back. Please call patient. °

## 2018-05-25 NOTE — Telephone Encounter (Signed)
Believes she got shocked and shes getting an alert tone. Can't send remote transmission b/c she doesn't have cell signal to send the transmission and can't go to town / a friends home to send due to social distancing guidelines.

## 2018-05-25 NOTE — Telephone Encounter (Signed)
Pt reports 2 ICD shocks since being seen by Dr Shirlee Latch. She feels well now.  She does not have a good cell signal at home and is unable to send a transmission. She does have transportation for Friday and could come to the office. Appt made for Friday at 11AM. I advised patient to call us back and let us know if she can't make it as we will bring someone into the office only for this appointment.  Gypsy Balsam, NP 05/25/2018 11:10 AM

## 2018-05-27 ENCOUNTER — Ambulatory Visit (INDEPENDENT_AMBULATORY_CARE_PROVIDER_SITE_OTHER): Payer: Medicaid Other | Admitting: *Deleted

## 2018-05-27 ENCOUNTER — Encounter: Payer: Medicaid Other | Admitting: Occupational Therapy

## 2018-05-27 ENCOUNTER — Other Ambulatory Visit: Payer: Self-pay

## 2018-05-27 DIAGNOSIS — I472 Ventricular tachycardia, unspecified: Secondary | ICD-10-CM

## 2018-05-27 DIAGNOSIS — Z95810 Presence of automatic (implantable) cardiac defibrillator: Secondary | ICD-10-CM | POA: Diagnosis not present

## 2018-05-27 LAB — CUP PACEART INCLINIC DEVICE CHECK
Battery Remaining Longevity: 58 mo
Battery Voltage: 2.94 V
Brady Statistic AP VP Percent: 2.61 %
Brady Statistic AP VS Percent: 34.3 %
Brady Statistic AS VP Percent: 0.59 %
Brady Statistic AS VS Percent: 62.5 %
Brady Statistic RA Percent Paced: 35.67 %
Brady Statistic RV Percent Paced: 2.57 %
Date Time Interrogation Session: 20200508164242
HighPow Impedance: 42 Ohm
HighPow Impedance: 52 Ohm
Implantable Lead Implant Date: 20130125
Implantable Lead Implant Date: 20130125
Implantable Lead Location: 753859
Implantable Lead Location: 753860
Implantable Lead Model: 5076
Implantable Lead Model: 6947
Implantable Pulse Generator Implant Date: 20181220
Lead Channel Impedance Value: 285 Ohm
Lead Channel Impedance Value: 380 Ohm
Lead Channel Impedance Value: 437 Ohm
Lead Channel Pacing Threshold Amplitude: 0.5 V
Lead Channel Pacing Threshold Amplitude: 0.75 V
Lead Channel Pacing Threshold Pulse Width: 0.4 ms
Lead Channel Pacing Threshold Pulse Width: 0.4 ms
Lead Channel Sensing Intrinsic Amplitude: 12.75 mV
Lead Channel Sensing Intrinsic Amplitude: 3.375 mV
Lead Channel Setting Pacing Amplitude: 1.5 V
Lead Channel Setting Pacing Amplitude: 2.5 V
Lead Channel Setting Pacing Pulse Width: 0.4 ms
Lead Channel Setting Sensing Sensitivity: 0.6 mV

## 2018-05-27 NOTE — Progress Notes (Signed)
ICD check in clinic, added-on for auditory alert tone. Alert tone for unsuccessful [facility] transmission. 5 treated VF episodes since last check on 05/09/18--all terminated with 35J shock x1, episodes appear VF/polymorphic VT. 6 VT-NS episodes and 3 high rate-NS episodes also noted. Pt reports she was not aware of any shocks until those on 5/2 and 5/3--reports she likely passed out with these episodes as she felt her body "jolt" but did not feel pain. She feels well now. Pt aware of [place] DMV driving restrictions x6 months. Reports compliance with medications, +amiodarone, digoxin, and ranolazine, states her med list was recently clarified via home visit/CHF clinic on 05/23/18. Orders entered for BMET, Mg, dig level, and amio level per [name].  Thresholds and sensing consistent with previous device measurements. Impedance trends stable over time. No mode switches. Histogram distribution appropriate for patient and level of activity. OptiVol up since 04/27/18, appears to be heading toward baseline at this time, pt denies fluid symptoms. No changes made this session. Device programmed at appropriate safety margins. Device programmed to optimize intrinsic conduction. Estimated longevity 4.8 years. Patient enrolled in remote follow-up, but is unable to transmit from home due to poor signal. Patient education completed including shock plan. Pt aware to call for auditory alert tone. ROV with [name] next available.

## 2018-05-30 LAB — BASIC METABOLIC PANEL
BUN/Creatinine Ratio: 14 (ref 9–23)
BUN: 26 mg/dL — ABNORMAL HIGH (ref 6–24)
CO2: 21 mmol/L (ref 20–29)
Calcium: 10.3 mg/dL — ABNORMAL HIGH (ref 8.7–10.2)
Chloride: 99 mmol/L (ref 96–106)
Creatinine, Ser: 1.91 mg/dL — ABNORMAL HIGH (ref 0.57–1.00)
GFR calc Af Amer: 33 mL/min/{1.73_m2} — ABNORMAL LOW (ref >59)
GFR calc non Af Amer: 29 mL/min/{1.73_m2} — ABNORMAL LOW (ref >59)
Glucose: 90 mg/dL (ref 65–99)
Potassium: 4 mmol/L (ref 3.5–5.2)
Sodium: 142 mmol/L (ref 134–144)

## 2018-05-30 LAB — AMIODARONE LEVEL
Amiodarone, Serum: 0.9 ug/mL — ABNORMAL LOW (ref 1.0–2.5)
Noramiodarone,S: 0.6 ug/mL — ABNORMAL LOW (ref 1.0–2.5)

## 2018-05-30 LAB — DIGOXIN LEVEL: Digoxin, Serum: 0.8 ng/mL (ref 0.5–0.9)

## 2018-05-30 LAB — MAGNESIUM: Magnesium: 2.4 mg/dL — ABNORMAL HIGH (ref 1.6–2.3)

## 2018-05-31 ENCOUNTER — Encounter: Payer: Medicaid Other | Admitting: Occupational Therapy

## 2018-06-02 ENCOUNTER — Other Ambulatory Visit: Payer: Self-pay

## 2018-06-02 ENCOUNTER — Telehealth (INDEPENDENT_AMBULATORY_CARE_PROVIDER_SITE_OTHER): Payer: Medicaid Other | Admitting: Internal Medicine

## 2018-06-02 DIAGNOSIS — I472 Ventricular tachycardia, unspecified: Secondary | ICD-10-CM

## 2018-06-02 DIAGNOSIS — Z95810 Presence of automatic (implantable) cardiac defibrillator: Secondary | ICD-10-CM | POA: Diagnosis not present

## 2018-06-02 NOTE — Progress Notes (Signed)
Electrophysiology TeleHealth Note   Due to national recommendations of social distancing due to COVID 19, an audio/video telehealth visit is felt to be most appropriate for this patient at this time.  See MyChart message from today for the patient's consent to telehealth for [facility].   Date:  06/02/2018   ID:  Randa SpikeMyrriell D Bosshart, DOB 1960-06-04, MRN [id number]  Location: patient's home  Provider location: [address], [place]  Evaluation Performed: Follow-up visit  PCP:  Alvina FilbertHunter, Denise, MD  Cardiologist:  Lewayne BuntingGregg Taylor, MD Dr. Shirlee LatchMcLean Electrophysiologist:  Dr Ladona Ridgelaylor  Chief Complaint:  "I been doing better since Dr. Shirlee LatchMcLean changed my medicines"  History of Present Illness:    Jessilyn D Neale BurlyFreeman is a 58 y.o. female who presents via audio/video conferencing for a telehealth visit today.  She has chronic systolic heart failure, s/p ICD insertion, VT/VF, and obesity. She has had several ICD shocks for VF. There were some compliance issuses. She appears to be back on the amiodarone as prescribed.The patient is otherwise without complaint today.  The patient denies symptoms of fevers, chills, cough, or new SOB worrisome for COVID 19.  Past Medical History:  Diagnosis Date  . Anxiety   . Arthritis   . Asthma   . Bronchitis   . CHF (congestive heart failure) (HCC)    a. EF 20-25% by cath in 11/2010 with cath showing normal cors  . CKD (chronic kidney disease) stage 3, GFR 30-59 ml/min (HCC)   . Fibromyalgia   . GERD (gastroesophageal reflux disease)   . Hallux valgus of left foot   . Hay fever   . Headache(784.0)   . History of pneumonia   . History of seizures   . Hyperlipidemia   . Hypertension   . Nonischemic cardiomyopathy (HCC)   . Obesity   . Stroke (HCC)   . Systemic lupus erythematosus (HCC)   . Type 2 diabetes mellitus (HCC)     Past Surgical History:  Procedure Laterality Date  . ICD  02/13/2011   Medtronic Protecta DR XT  . ICD GENERATOR  CHANGEOUT N/A 01/07/2017   Procedure: ICD GENERATOR CHANGEOUT;  Surgeon: Marinus Mawaylor, Gregg W, MD;  [facility];  Service: Cardiovascular;  Laterality: N/A;  . IMPLANTABLE CARDIOVERTER DEFIBRILLATOR IMPLANT N/A 02/13/2011   Procedure: IMPLANTABLE CARDIOVERTER DEFIBRILLATOR IMPLANT;  Surgeon: Thurmon FairMihai Croitoru, MD;  Location: [facility] CATH LAB;  Service: Cardiovascular;  Laterality: N/A;  . LEFT HEART CATHETERIZATION WITH CORONARY ANGIOGRAM N/A 12/05/2010   Procedure: LEFT HEART CATHETERIZATION WITH CORONARY ANGIOGRAM;  Surgeon: Chrystie NoseKenneth C. Hilty, MD;  [facility];  Service: Cardiovascular;  Laterality: N/A;  . RIGHT ANKLE    . TUBAL LIGATION    . US ECHOCARDIOGRAPHY  11/09/2010   mild LV enlargement w/conc. LVH & severe global hypokinesis EF 30-35%,mod. diastolic dysfunction, mod. TR,mild AI,mild to mod MR,mild PI    Current Outpatient Medications  Medication Sig Dispense Refill  . acetaminophen (TYLENOL) 325 MG tablet Take 1-2 tablets (325-650 mg total) by mouth every 4 (four) hours as needed for mild pain.    Marland Kitchen. albuterol (PROVENTIL HFA;VENTOLIN HFA) 108 (90 BASE) MCG/ACT inhaler Inhale 2 puffs into the lungs 4 (four) times daily as needed. For wheezing and shortness of breath    . alum & mag hydroxide-simeth (MAALOX/MYLANTA) 200-200-20 MG/5ML suspension Take 30 mLs by mouth every 6 (six) hours as needed for indigestion or heartburn. 355 mL 0  . Amino Acids-Protein Hydrolys (FEEDING SUPPLEMENT, PRO-STAT  SUGAR FREE 64,) LIQD Take 30 mLs by mouth 2 (two) times daily. 887 mL 0  . amiodarone (PACERONE) 200 MG tablet Take 1 tablet (200 mg total) by mouth 2 (two) times daily. 60 tablet 5  . cetirizine (ZYRTEC) 10 MG tablet Take 1 tablet (10 mg total) by mouth daily. 30 tablet 0  . clopidogrel (PLAVIX) 75 MG tablet Take 1 tablet (75 mg total) by mouth daily. 30 tablet 0  . COD LIVER OIL PO Take 1 tablet by mouth daily.     . colchicine 0.6 MG tablet Take 1 tablet (0.6 mg total) by mouth  daily. 30 tablet 0  . Digoxin 62.5 MCG TABS Take 0.0625 mg by mouth daily. 30 tablet 5  . dimenhyDRINATE (DRAMAMINE) 50 MG tablet Take 0.5 tablets (25 mg total) by mouth every 8 (eight) hours as needed for dizziness. 30 tablet 0  . docusate sodium (COLACE) 100 MG capsule Take 1 capsule (100 mg total) by mouth 2 (two) times daily. 60 capsule 0  . guaiFENesin (ROBITUSSIN) 100 MG/5ML SOLN Take 5 mLs (100 mg total) by mouth every 4 (four) hours as needed for cough or to loosen phlegm. 236 mL 0  . hydrocortisone cream 0.5 % Apply topically 3 (three) times daily as needed for itching. 30 g 0  . hydroxychloroquine (PLAQUENIL) 200 MG tablet Take 1 tablet (200 mg total) by mouth 2 (two) times daily. 60 tablet 1  . isosorbide-hydrALAZINE (BIDIL) 20-37.5 MG tablet Take 2 tablets by mouth 3 (three) times daily. 180 tablet 3  . mometasone-formoterol (DULERA) 200-5 MCG/ACT AERO Inhale 2 puffs into the lungs 2 (two) times daily. 8.8 g 3  . Multiple Vitamins-Minerals (MULTIVITAMIN WOMEN 50+ PO) Take 1 tablet by mouth daily.    . polyethylene glycol (MIRALAX / GLYCOLAX) packet Take 17 g by mouth daily. 14 each 0  . potassium chloride SA (K-DUR) 20 MEQ tablet Take every morning and every other afternoon. 45 tablet 1  . ranolazine (RANEXA) 500 MG 12 hr tablet Take 1 tablet (500 mg total) by mouth 2 (two) times daily. 60 tablet 1  . spironolactone (ALDACTONE) 25 MG tablet Take 1 tablet (25 mg total) by mouth daily. 30 tablet 5  . torsemide (DEMADEX) 20 MG tablet Take 80mg  every morning and 40mg  every other afternoon. 150 tablet 5  . venlafaxine XR (EFFEXOR-XR) 150 MG 24 hr capsule Take 1 capsule (150 mg total) by mouth daily. 30 capsule 1   No current facility-administered medications for this visit.     Allergies:   Aspirin; Penicillins; and Shellfish allergy   Social History:  The patient  reports that she quit smoking about 12 years ago. Her smoking use included cigarettes. She started smoking about  38 years ago. She has a 26.00 pack-year smoking history. She has never used smokeless tobacco. She reports previous alcohol use. She reports that she does not use drugs.   Family History:  The patient's  family history includes Diabetes in her father; Heart failure in her mother; Hypertension in her brother, daughter, and sister.   ROS:  Please see the history of present illness.   All other systems are personally reviewed and negative.    Exam:    Vital Signs:  LMP 01/30/2011    Labs/Other Tests and Data Reviewed:    Recent Labs: 03/24/2018: B Natriuretic Peptide 893.2 03/25/2018: ALT 26 04/11/2018: Hemoglobin 13.1; Platelets 403 05/27/2018: BUN 26; Creatinine, Ser 1.91; Magnesium 2.4; Potassium 4.0; Sodium 142   Wt  Readings from Last 3 Encounters:  05/17/18 230 lb (104.3 kg)  04/11/18 215 lb 13.3 oz (97.9 kg)  04/05/18 222 lb 14.2 oz (101.1 kg)     Other studies personally reviewed:  Last device remote is reviewed from PaceART PDF dated 5/8 which reveals normal device function, multiple episodes of VF/VF noted.   ASSESSMENT & PLAN:    1.  VT/VF - her episodes have improved over the past week. She is back on amiodarone. Her level was low. She will continue amio 200 bid 2. Chronic systolic heart failure - her symptoms are class 2. She will continue a low sodium diet. 3. Obesity - she is encouraged to lose weight. 4. COVID 19 screen The patient denies symptoms of COVID 19 at this time.  The importance of social distancing was discussed today.  Follow-up:  3 months with me Next remote: 8/20  Current medicines are reviewed at length with the patient today.   The patient does not have concerns regarding her medicines.  The following changes were made today:  none  Labs/ tests ordered today include: none No orders of the defined types were placed in this encounter.    Patient Risk:  after full review of this patients clinical status, I feel that they are at moderate risk at this  time.  Today, I have spent 15 minutes with the patient with telehealth technology discussing all of the above.    Signed, Lewayne Bunting, MD  06/02/2018 12:47 PM     [facility] [address] [place] [address] [phone] (office) [phone] (fax)

## 2018-06-03 ENCOUNTER — Encounter: Payer: Medicaid Other | Admitting: Occupational Therapy

## 2018-06-03 ENCOUNTER — Telehealth: Payer: Self-pay

## 2018-06-03 NOTE — Telephone Encounter (Signed)
Pt states her ICD fired. Pt is not having any symptoms. The pt do wants to come in and be seen. I asked the pt to seen a manual tranmission but she states she do not get cell reception to send a transmission. The best number to reach the pt is [phone]. I told her I will have the nurse call her back.

## 2018-06-03 NOTE — Telephone Encounter (Signed)
Spoke with patient by phone. She had another ICD shock on mother's day and has continued to have palpitations. She did not discuss with Dr Ladona Ridgel at appt yesterday. Reviewed with Dr Ladona Ridgel today - will stop Plaquenil and she will call rheumatology for alternative treatment in case this is pro-arrhythmic.  Follow up appt made with Dr Ladona Ridgel for 6/4 in office for EKG, labs, and to evaluate ventricular ectopy burden.  At that time, need to turn off device alarms for not being able to transmit to [facility] (pt does not have good service at home)  Pt aware and agrees with plan.  Katherine Balsam, NP 06/03/2018 9:02 AM

## 2018-06-07 ENCOUNTER — Encounter: Payer: Medicaid Other | Admitting: Occupational Therapy

## 2018-06-10 ENCOUNTER — Encounter: Payer: Medicaid Other | Admitting: Occupational Therapy

## 2018-06-14 ENCOUNTER — Encounter: Payer: Medicaid Other | Admitting: Occupational Therapy

## 2018-06-15 ENCOUNTER — Other Ambulatory Visit: Payer: Self-pay | Admitting: Cardiology

## 2018-06-16 ENCOUNTER — Other Ambulatory Visit (HOSPITAL_COMMUNITY): Payer: Self-pay | Admitting: *Deleted

## 2018-06-16 ENCOUNTER — Telehealth: Payer: Self-pay | Admitting: *Deleted

## 2018-06-16 MED ORDER — TORSEMIDE 20 MG PO TABS: ORAL_TABLET | ORAL | 5 refills | Status: DC

## 2018-06-16 NOTE — Telephone Encounter (Signed)
06/15/18 patient left voicemail for refill request message unclear. Left voicemail for patient to call back with request and to please speak clearly or spell out medication for verification.

## 2018-06-17 ENCOUNTER — Encounter: Payer: Medicaid Other | Admitting: Occupational Therapy

## 2018-06-21 ENCOUNTER — Telehealth (HOSPITAL_COMMUNITY): Payer: Self-pay

## 2018-06-21 ENCOUNTER — Telehealth: Payer: Self-pay

## 2018-06-21 ENCOUNTER — Other Ambulatory Visit (HOSPITAL_COMMUNITY): Payer: Self-pay | Admitting: Cardiology

## 2018-06-21 NOTE — Telephone Encounter (Signed)
Left messaged regarding covid-19 screnning prior to appt.

## 2018-06-21 NOTE — Telephone Encounter (Signed)
[employer] called requesting patient medication list so they can verify patient meds. List faxed to [facility] [phone]

## 2018-06-23 ENCOUNTER — Telehealth: Payer: Self-pay

## 2018-06-23 ENCOUNTER — Telehealth: Payer: Self-pay | Admitting: Internal Medicine

## 2018-06-23 ENCOUNTER — Encounter: Payer: Medicaid Other | Admitting: Internal Medicine

## 2018-06-23 NOTE — Telephone Encounter (Signed)
Follow up    Pt is returning call  She said she can come first thing in the morning    Please call

## 2018-06-23 NOTE — Telephone Encounter (Signed)
LMOVM to ask pt if she could come to the [place] office today.

## 2018-06-23 NOTE — Telephone Encounter (Signed)
Spoke with pt, arranged appt on 07/12/18 @ 1000.

## 2018-06-23 NOTE — Telephone Encounter (Signed)
LMOVM

## 2018-06-23 NOTE — Telephone Encounter (Signed)
  Patient says she was told her appt with Dr Ladona Ridgel today 06/23/18 was at the [place] office. She has missed her appt and needs to have her pacemaker alarm cut off. She is asking if she can come by tomorrow to have someone cut it off for her. Please call her back

## 2018-07-05 ENCOUNTER — Telehealth: Payer: Self-pay

## 2018-07-05 NOTE — Telephone Encounter (Signed)

## 2018-07-12 ENCOUNTER — Ambulatory Visit (INDEPENDENT_AMBULATORY_CARE_PROVIDER_SITE_OTHER): Payer: Medicaid Other | Admitting: Internal Medicine

## 2018-07-12 ENCOUNTER — Other Ambulatory Visit: Payer: Self-pay

## 2018-07-12 ENCOUNTER — Encounter: Payer: Self-pay | Admitting: Internal Medicine

## 2018-07-12 VITALS — BP 130/76 | HR 79 | Ht 71.0 in | Wt 230.0 lb

## 2018-07-12 DIAGNOSIS — I472 Ventricular tachycardia, unspecified: Secondary | ICD-10-CM | POA: Insufficient documentation

## 2018-07-12 DIAGNOSIS — Z95810 Presence of automatic (implantable) cardiac defibrillator: Secondary | ICD-10-CM | POA: Diagnosis not present

## 2018-07-12 DIAGNOSIS — I5022 Chronic systolic (congestive) heart failure: Secondary | ICD-10-CM | POA: Diagnosis not present

## 2018-07-12 LAB — CUP PACEART INCLINIC DEVICE CHECK
Battery Remaining Longevity: 47 mo
Battery Voltage: 2.97 V
Brady Statistic AP VP Percent: 0 %
Brady Statistic AP VS Percent: 0.16 %
Brady Statistic AS VP Percent: 0.1 %
Brady Statistic AS VS Percent: 99.74 %
Brady Statistic RA Percent Paced: 0.16 %
Brady Statistic RV Percent Paced: 0.09 %
Date Time Interrogation Session: 20200623134007
HighPow Impedance: 41 Ohm
HighPow Impedance: 51 Ohm
Implantable Lead Implant Date: 20130125
Implantable Lead Implant Date: 20130125
Implantable Lead Location: 753859
Implantable Lead Location: 753860
Implantable Lead Model: 5076
Implantable Lead Model: 6947
Implantable Pulse Generator Implant Date: 20181220
Lead Channel Impedance Value: 285 Ohm
Lead Channel Impedance Value: 380 Ohm
Lead Channel Impedance Value: 437 Ohm
Lead Channel Pacing Threshold Amplitude: 0.625 V
Lead Channel Pacing Threshold Amplitude: 0.75 V
Lead Channel Pacing Threshold Pulse Width: 0.4 ms
Lead Channel Pacing Threshold Pulse Width: 0.4 ms
Lead Channel Sensing Intrinsic Amplitude: 1 mV
Lead Channel Sensing Intrinsic Amplitude: 11.875 mV
Lead Channel Setting Pacing Amplitude: 1.5 V
Lead Channel Setting Pacing Amplitude: 2.5 V
Lead Channel Setting Pacing Pulse Width: 0.4 ms
Lead Channel Setting Sensing Sensitivity: 0.6 mV

## 2018-07-12 MED ORDER — AMIODARONE HCL 200 MG PO TABS: ORAL_TABLET | ORAL | 3 refills | Status: DC

## 2018-07-12 NOTE — Patient Instructions (Addendum)
Medication Instructions:  Your physician has recommended you make the following change in your medication:   1.  On July 24, 2018----Reduce your amiodarone 200 mg---Take one tablet (200 mg) in the morning and 1/2 tablet (100 mg) in the PM.     Labwork: None ordered.  Testing/Procedures: None ordered.  Follow-Up: Your physician wants you to follow-up in: 3 months with Dr. Lovena Le.      Remote monitoring is used to monitor your ICD from home. This monitoring reduces the number of office visits required to check your device to one time per year. It allows [place] to keep an eye on the functioning of your device to ensure it is working properly. You are scheduled for a device check from home on 07/27/2018. You may send your transmission at any time that day. If you have a wireless device, the transmission will be sent automatically. After your physician reviews your transmission, you will receive a postcard with your next transmission date.  Any Other Special Instructions Will Be Listed Below (If Applicable).  If you need a refill on your cardiac medications before your next appointment, please call your pharmacy.

## 2018-07-12 NOTE — Progress Notes (Signed)
HPI The patient is a 58 yo woman with a h/o chronic systolic heart failure, VT, VF, s/p ICD insertion. She has recently had ICD shocks and was placed on amiodarone. Her ventricular arrhythmias have been quiet. She has not had any side effects from her amiodarone. No edema. No chest pain.  Allergies  Allergen Reactions  . Aspirin Other (See Comments)  . Penicillins Itching    Can take amoxicillin without a reaction  . Shellfish Allergy Other (See Comments)    Causes to have grand mal seizures     Current Outpatient Medications  Medication Sig Dispense Refill  . acetaminophen (TYLENOL) 325 MG tablet Take 1-2 tablets (325-650 mg total) by mouth every 4 (four) hours as needed for mild pain.    Marland Kitchen. albuterol (PROVENTIL HFA;VENTOLIN HFA) 108 (90 BASE) MCG/ACT inhaler Inhale 2 puffs into the lungs 4 (four) times daily as needed. For wheezing and shortness of breath    . alum & mag hydroxide-simeth (MAALOX/MYLANTA) 200-200-20 MG/5ML suspension Take 30 mLs by mouth every 6 (six) hours as needed for indigestion or heartburn. 355 mL 0  . Amino Acids-Protein Hydrolys (FEEDING SUPPLEMENT, PRO-STAT SUGAR FREE 64,) LIQD Take 30 mLs by mouth 2 (two) times daily. 887 mL 0  . amiodarone (PACERONE) 200 MG tablet Take 1 tablet (200 mg total) by mouth 2 (two) times daily. 60 tablet 5  . cetirizine (ZYRTEC) 10 MG tablet Take 1 tablet (10 mg total) by mouth daily. 30 tablet 0  . clopidogrel (PLAVIX) 75 MG tablet Take 1 tablet (75 mg total) by mouth daily. 30 tablet 0  . COD LIVER OIL PO Take 1 tablet by mouth daily.     . Digoxin 62.5 MCG TABS Take 0.0625 mg by mouth daily. 30 tablet 5  . dimenhyDRINATE (DRAMAMINE) 50 MG tablet Take 0.5 tablets (25 mg total) by mouth every 8 (eight) hours as needed for dizziness. 30 tablet 0  . docusate sodium (COLACE) 100 MG capsule Take 1 capsule (100 mg total) by mouth 2 (two) times daily. 60 capsule 0  . guaiFENesin (ROBITUSSIN) 100 MG/5ML SOLN Take 5 mLs (100 mg  total) by mouth every 4 (four) hours as needed for cough or to loosen phlegm. 236 mL 0  . hydrocortisone cream 0.5 % Apply topically 3 (three) times daily as needed for itching. 30 g 0  . hydroxychloroquine (PLAQUENIL) 200 MG tablet Take 1 tablet (200 mg total) by mouth 2 (two) times daily. 60 tablet 1  . isosorbide-hydrALAZINE (BIDIL) 20-37.5 MG tablet Take 2 tablets by mouth 3 (three) times daily. 180 tablet 3  . MITIGARE 0.6 MG CAPS TAKE (1) CAPSULE BY MOUTH ONCE DAILY. 30 capsule 0  . Multiple Vitamins-Minerals (MULTIVITAMIN WOMEN 50+ PO) Take 1 tablet by mouth daily.    . polyethylene glycol (MIRALAX / GLYCOLAX) packet Take 17 g by mouth daily. 14 each 0  . potassium chloride SA (K-DUR) 20 MEQ tablet Take 20meq every morning and 20meq every other afternoon. 45 tablet 1  . ranolazine (RANEXA) 500 MG 12 hr tablet Take 1 tablet (500 mg total) by mouth 2 (two) times daily. 60 tablet 1  . spironolactone (ALDACTONE) 25 MG tablet Take 1 tablet (25 mg total) by mouth daily. 30 tablet 5  . torsemide (DEMADEX) 20 MG tablet Take 80mg  every morning and 40mg  every other afternoon. 180 tablet 5  . venlafaxine XR (EFFEXOR-XR) 150 MG 24 hr capsule Take 1 capsule (150 mg total) by mouth daily. 30 capsule  1  . mometasone-formoterol (DULERA) 200-5 MCG/ACT AERO Inhale 2 puffs into the lungs 2 (two) times daily. 8.8 g 3   No current facility-administered medications for this visit.      Past Medical History:  Diagnosis Date  . Anxiety   . Arthritis   . Asthma   . Bronchitis   . CHF (congestive heart failure) (HCC)    a. EF 20-25% by cath in 11/2010 with cath showing normal cors  . CKD (chronic kidney disease) stage 3, GFR 30-59 ml/min (HCC)   . Fibromyalgia   . GERD (gastroesophageal reflux disease)   . Hallux valgus of left foot   . Hay fever   . Headache(784.0)   . History of pneumonia   . History of seizures   . Hyperlipidemia   . Hypertension   . Nonischemic cardiomyopathy (HCC)   . Obesity    . Stroke (HCC)   . Systemic lupus erythematosus (HCC)   . Type 2 diabetes mellitus (HCC)     ROS:   All systems reviewed and negative except as noted in the HPI.   Past Surgical History:  Procedure Laterality Date  . ICD  02/13/2011   Medtronic Protecta DR XT  . ICD GENERATOR CHANGEOUT N/A 01/07/2017   Procedure: ICD GENERATOR CHANGEOUT;  Surgeon: Marinus Maw, MD;  Location[facility] INVASIVE CV LAB;  Service: Cardiovascular;  Laterality: N/A;  . IMPLANTABLE CARDIOVERTER DEFIBRILLATOR IMPLANT N/A 02/13/2011   Procedure: IMPLANTABLE CARDIOVERTER DEFIBRILLATOR IMPLANT;  Surgeon: Thurmon Fair, MD;  Location: [facility] CATH LAB;  Service: Cardiovascular;  Laterality: N/A;  . LEFT HEART CATHETERIZATION WITH CORONARY ANGIOGRAM N/A 12/05/2010   Procedure: LEFT HEART CATHETERIZATION WITH CORONARY ANGIOGRAM;  Surgeon: Chrystie Nose, MD;  Location: [facility] CATH LAB;  Service: Cardiovascular;  Laterality: N/A;  . RIGHT ANKLE    . TUBAL LIGATION    . US ECHOCARDIOGRAPHY  11/09/2010   mild LV enlargement w/conc. LVH & severe global hypokinesis EF 30-35%,mod. diastolic dysfunction, mod. TR,mild AI,mild to mod MR,mild PI     Family History  Problem Relation Age of Onset  . Heart failure Mother   . Diabetes Father   . Hypertension Sister   . Hypertension Brother   . Hypertension Daughter      Social History   Socioeconomic History  . Marital status: Legally Separated    Spouse name: Not on file  . Number of children: Not on file  . Years of education: Not on file  . Highest education level: Not on file  Occupational History  . Not on file  Social Needs  . Financial resource strain: Not very hard  . Food insecurity    Worry: Never true    Inability: Never true  . Transportation needs    Medical: No    Non-medical: No  Tobacco Use  . Smoking status: Former Smoker    Packs/day: 1.00    Years: 26.00    Pack years: 26.00    Types: Cigarettes    Start date: 07/01/1979    Quit date:  10/03/2005    Years since quitting: 12.7  . Smokeless tobacco: Never Used  Substance and Sexual Activity  . Alcohol use: Not Currently    Alcohol/week: 0.0 standard drinks    Comment: quit 2007  . Drug use: No  . Sexual activity: Yes  Lifestyle  . Physical activity    Days per week: Patient refused    Minutes per session: Patient refused  . Stress: Only a little  Relationships  . Social Herbalist on phone: Patient refused    Gets together: Patient refused    Attends religious service: Patient refused    Active member of club or organization: Patient refused    Attends meetings of clubs or organizations: Patient refused    Relationship status: Patient refused  . Intimate partner violence    Fear of current or ex partner: No    Emotionally abused: No    Physically abused: No    Forced sexual activity: No  Other Topics Concern  . Not on file  Social History Narrative  . Not on file     BP 130/76   Pulse 79   Ht 5\' 11"  (1.803 m)   Wt 230 lb (104.3 kg)   LMP 01/30/2011   SpO2 96%   BMI 32.08 kg/m   Physical Exam:  Well appearing NAD HEENT: Unremarkable Neck:  No JVD, no thyromegally Lymphatics:  No adenopathy Back:  No CVA tenderness Lungs:  No increased work of breathing HEART:  Regular rate rhythm, no murmurs, no rubs, no clicks Abd:  soft, positive bowel sounds, no organomegally, no rebound, no guarding Ext:  2 plus pulses, no edema, no cyanosis, no clubbing Skin:  No rashes no nodules Neuro:  CN II through XII intact, motor grossly intact  EKG - NSR with LBBB, left atrial enlargement.  DEVICE  Normal device function.  See PaceArt for details.   Assess/Plan: 1. VT/VF - her VT has quieted on amiodarone. I asked her to reduce her amiodarone down to 300 mg daily.  2. ICD- her medtronic ICD is working normally.  3. Chronic systolic heart failure - her symptoms are class 2. She appears euvolemic on medical therapy. She is encouraged to avoid salty  food.  4. HTN - her blood pressure is controlled. We will follow.  Mikle Bosworth.D.

## 2018-07-20 ENCOUNTER — Other Ambulatory Visit (HOSPITAL_COMMUNITY): Payer: Self-pay | Admitting: Cardiology

## 2018-07-27 ENCOUNTER — Encounter: Payer: Medicaid Other | Admitting: *Deleted

## 2018-07-28 ENCOUNTER — Telehealth: Payer: Self-pay

## 2018-07-28 NOTE — Telephone Encounter (Signed)
Left message for patient to remind of missed remote transmission.  

## 2018-08-01 ENCOUNTER — Telehealth: Payer: Self-pay

## 2018-08-01 NOTE — Telephone Encounter (Signed)
Pt states her ICD shocked her. Pt states she did not have any chest pains, no dizziness, no shortness of breath. Pt states she did not feel well. Pt states she felt nauses and had some diarrhea afterwards. I asked the pt was she able to send a manual transmission with her home monitor. She states she could not because she do not have good wifi at her house and no one will let her do it from their homes because of Covid-19. I told her I will send a phone note to the nurse and have them give her a call back. Pt verbalized understanding.

## 2018-08-01 NOTE — Telephone Encounter (Signed)
Pt states she received a shock 07/31/18 in the afternoon. States she did not feel good Thursday-Saturday and just tired on Sunday. After receiving shock, pt says she felt anxious and "jittery." Educated pt to call 911 if she does not feel well after receiving shock and advised her of [employer] driving restrictions. Pt is unable to send remote transmissions. Will schedule for DC appt. Pt has to arrange transportation to the office and states she will call DC back.

## 2018-08-01 NOTE — Telephone Encounter (Signed)
I scheduled the pt for an appointment tomorrow at 11:30 am.       COVID-19 Pre-Screening Questions:  . In the past 7 to 10 days have you had a cough,  shortness of breath, headache, congestion, fever (100 or greater) body aches, chills, sore throat, or sudden loss of taste or sense of smell? No . Have you been around anyone with known Covid 19. No . Have you been around anyone who is awaiting Covid 19 test results in the past 7 to 10 days? No . Have you been around anyone who has been exposed to Covid 19, or has mentioned symptoms of Covid 19 within the past 7 to 10 days? No  If you have any concerns/questions about symptoms patients report during screening (either on the phone or at threshold). Contact the provider seeing the patient or DOD for further guidance.  If neither are available contact a member of the leadership team.       Pt answered no to all Covid-19 prescreening questions. I told her to wear a mask to her appointment. I also told the pt to come alone to her appointment. I told her I know someone will be driving her but they can not come onto the 3rd floor. I told her if anything changes to call and let us know. Pt verbalized understanding.

## 2018-08-02 ENCOUNTER — Ambulatory Visit (INDEPENDENT_AMBULATORY_CARE_PROVIDER_SITE_OTHER): Payer: Medicaid Other | Admitting: *Deleted

## 2018-08-02 ENCOUNTER — Other Ambulatory Visit: Payer: Self-pay

## 2018-08-02 DIAGNOSIS — I428 Other cardiomyopathies: Secondary | ICD-10-CM | POA: Diagnosis not present

## 2018-08-02 LAB — CUP PACEART INCLINIC DEVICE CHECK
Battery Remaining Longevity: 45 mo
Battery Voltage: 2.9 V
Brady Statistic AP VP Percent: 0 %
Brady Statistic AP VS Percent: 0.31 %
Brady Statistic AS VP Percent: 0.03 %
Brady Statistic AS VS Percent: 99.66 %
Brady Statistic RA Percent Paced: 0.31 %
Brady Statistic RV Percent Paced: 0.03 %
Date Time Interrogation Session: 20200714153927
HighPow Impedance: 42 Ohm
HighPow Impedance: 53 Ohm
Implantable Lead Implant Date: 20130125
Implantable Lead Implant Date: 20130125
Implantable Lead Location: 753859
Implantable Lead Location: 753860
Implantable Lead Model: 5076
Implantable Lead Model: 6947
Implantable Pulse Generator Implant Date: 20181220
Lead Channel Impedance Value: 323 Ohm
Lead Channel Impedance Value: 342 Ohm
Lead Channel Impedance Value: 437 Ohm
Lead Channel Pacing Threshold Amplitude: 0.625 V
Lead Channel Pacing Threshold Amplitude: 0.75 V
Lead Channel Pacing Threshold Pulse Width: 0.4 ms
Lead Channel Pacing Threshold Pulse Width: 0.4 ms
Lead Channel Sensing Intrinsic Amplitude: 1.25 mV
Lead Channel Sensing Intrinsic Amplitude: 1.625 mV
Lead Channel Sensing Intrinsic Amplitude: 10.375 mV
Lead Channel Sensing Intrinsic Amplitude: 11.25 mV
Lead Channel Setting Pacing Amplitude: 1.5 V
Lead Channel Setting Pacing Amplitude: 2.5 V
Lead Channel Setting Pacing Pulse Width: 0.4 ms
Lead Channel Setting Sensing Sensitivity: 0.6 mV

## 2018-08-02 MED ORDER — AMIODARONE HCL 200 MG PO TABS: 200.0000 mg | ORAL_TABLET | Freq: Two times a day (BID) | ORAL | 0 refills | Status: DC

## 2018-08-02 NOTE — Progress Notes (Signed)
ICD check in clinic. Normal device function. Thresholds and sensing consistent with previous device measurements. Impedance trends stable over time. No mode switches. 1 VF episode 18 sec duration, 07/31/18, ATP did not terminate. 1 shock delivered, terminated episode. Max V rate 240. Increase amio per Dr. Lovena Le. Histogram distribution appropriate for patient and level of activity. No changes made this session. Device programmed at appropriate safety margins. Device programmed to optimize intrinsic conduction. Estimated longevity 3.7 years. Pt unable to enroll in remote f/u. Plan to check device every 3 months. F/u w/ Dr. Lovena Le 10/14/18. Patient education completed including shock plan.

## 2018-08-05 ENCOUNTER — Other Ambulatory Visit (HOSPITAL_COMMUNITY): Payer: Self-pay | Admitting: Cardiology

## 2018-08-08 ENCOUNTER — Encounter: Payer: Self-pay | Admitting: Cardiology

## 2018-08-11 ENCOUNTER — Other Ambulatory Visit (HOSPITAL_COMMUNITY): Payer: Self-pay | Admitting: Cardiology

## 2018-08-15 ENCOUNTER — Other Ambulatory Visit (HOSPITAL_COMMUNITY): Payer: Self-pay | Admitting: Cardiology

## 2018-08-17 ENCOUNTER — Telehealth: Payer: Self-pay

## 2018-08-17 NOTE — Telephone Encounter (Signed)
Follow up   Patient states that she is returning your call. Please call. 

## 2018-08-17 NOTE — Telephone Encounter (Signed)
Pt called back, agreeable to an appointment in [place] on Friday, 08/19/18 at 11:00am at the [facility] office. Pt aware to not drive herself. ED precautions given for additional shocks or cardiac symptoms in the interim. Pt verbalizes understanding.      COVID-19 Pre-Screening Questions:  . In the past 7 to 10 days have you had a cough,  shortness of breath, headache, congestion, fever (100 or greater) body aches, chills, sore throat, or sudden loss of taste or sense of smell? . Have you been around anyone with known Covid 19? Marland Kitchen Have you been around anyone who is awaiting Covid 19 test results in the past 7 to 10 days? . Have you been around anyone who has been exposed to Covid 19, or has mentioned symptoms of Covid 19 within the past 7 to 10 days?  If you have any concerns/questions about symptoms patients report during screening (either on the phone or at threshold). Contact the provider seeing the patient or DOD for further guidance.  If neither are available contact a member of the leadership team.   Patient answered "no" to all pre-screening questions. Aware to wear her own mask and come alone to her appointment.

## 2018-08-17 NOTE — Telephone Encounter (Signed)
Pt states she was shocked by her ICD Monday and was not sure at first. Her ICD started beeping yesterday @ 6pm. The pt did not have any symptoms from the shock. The best number to call the pt is [phone].

## 2018-08-17 NOTE — Telephone Encounter (Signed)
Pt has no SHOB, no CP, and has had no syncopal events. She reports she has not felt ICD shock.Pt reports she feels fine until she takes her Amiodorone and then she has to rest because she feels her heart slow down.  Pt has no way to send remote transmission from her home. She will try to go to a neighbor house and send transmission.  Trying to set up appointment in [place] for Friday , 08/19/18, to check device.She wiill contact friend to set up transportation and call back to confirm time she will be able to come for appointment. Patient given ED precautions and shock plan reviewed.

## 2018-08-19 ENCOUNTER — Ambulatory Visit (INDEPENDENT_AMBULATORY_CARE_PROVIDER_SITE_OTHER): Payer: Medicaid Other | Admitting: *Deleted

## 2018-08-19 ENCOUNTER — Other Ambulatory Visit: Payer: Self-pay

## 2018-08-19 DIAGNOSIS — Z95810 Presence of automatic (implantable) cardiac defibrillator: Secondary | ICD-10-CM

## 2018-08-19 DIAGNOSIS — I472 Ventricular tachycardia, unspecified: Secondary | ICD-10-CM

## 2018-08-19 LAB — CUP PACEART INCLINIC DEVICE CHECK
Battery Remaining Longevity: 44 mo
Battery Voltage: 2.93 V
Brady Statistic AP VP Percent: 0 %
Brady Statistic AP VS Percent: 0.24 %
Brady Statistic AS VP Percent: 0.04 %
Brady Statistic AS VS Percent: 99.71 %
Brady Statistic RA Percent Paced: 0.25 %
Brady Statistic RV Percent Paced: 0.04 %
Date Time Interrogation Session: 20200731152504
HighPow Impedance: 44 Ohm
HighPow Impedance: 55 Ohm
Implantable Lead Implant Date: 20130125
Implantable Lead Implant Date: 20130125
Implantable Lead Location: 753859
Implantable Lead Location: 753860
Implantable Lead Model: 5076
Implantable Lead Model: 6947
Implantable Pulse Generator Implant Date: 20181220
Lead Channel Impedance Value: 323 Ohm
Lead Channel Impedance Value: 380 Ohm
Lead Channel Impedance Value: 437 Ohm
Lead Channel Pacing Threshold Amplitude: 0.5 V
Lead Channel Pacing Threshold Amplitude: 0.75 V
Lead Channel Pacing Threshold Pulse Width: 0.4 ms
Lead Channel Pacing Threshold Pulse Width: 0.4 ms
Lead Channel Sensing Intrinsic Amplitude: 0.9 mV
Lead Channel Sensing Intrinsic Amplitude: 11.25 mV
Lead Channel Setting Pacing Amplitude: 1.5 V
Lead Channel Setting Pacing Amplitude: 2.5 V
Lead Channel Setting Pacing Pulse Width: 0.4 ms
Lead Channel Setting Sensing Sensitivity: 0.6 mV

## 2018-08-19 NOTE — Progress Notes (Signed)
ICD check in clinic, added-on due to alert tone for unsuccessful CareAlert transmission. Normal device function. Thresholds and sensing consistent with previous device measurements. Impedance trends stable over time. No mode switches. 1 VF episode terminated with 35J shock x1 on 08/13/18, +amiodarone 200mg  BID and ranolazine, pt unaware of shock. 2 NSVT episodes immediately prior to shock. 1 monitored VT episode from 08/19/18 at 06:31, monomorphic VT, avg V rate 180bpm, pt reports noticing palpitations and felt her heart "drop" during this episode. Changes per Dr. Lovena Le: VT zone added at 176-200bpm, 32 initial beats, Rx 1 burst x3, Rx 2 ramp x3, Rx 3-6 35J CV, VT monitor zone lowered to 150bpm. Histogram distribution appropriate for patient and level of activity. Device programmed at appropriate safety margins. Device programmed to optimize intrinsic conduction. Estimated longevity 3.6 years. Pt unable to utilize Carelink monitor due to poor cell signal. Discussed option to turn off home monitor option in device so that pt doesn't receive auditory alerts for unsuccessful CareAlert transmissions--pt declines as she would then not know when to call us. Alert time adjusted to 21:30 per pt request. Patient education completed including shock plan. She no longer drives. No medication changes today per Dr. Lovena Le. ROV with Dr. Lovena Le on 10/14/18.

## 2018-09-02 ENCOUNTER — Other Ambulatory Visit (HOSPITAL_COMMUNITY): Payer: Self-pay | Admitting: Cardiology

## 2018-09-02 ENCOUNTER — Other Ambulatory Visit: Payer: Self-pay | Admitting: Internal Medicine

## 2018-09-02 DIAGNOSIS — I428 Other cardiomyopathies: Secondary | ICD-10-CM

## 2018-10-05 ENCOUNTER — Other Ambulatory Visit (HOSPITAL_COMMUNITY): Payer: Self-pay | Admitting: Cardiology

## 2018-10-10 ENCOUNTER — Other Ambulatory Visit (HOSPITAL_COMMUNITY): Payer: Self-pay | Admitting: Cardiology

## 2018-10-14 ENCOUNTER — Other Ambulatory Visit: Payer: Self-pay

## 2018-10-14 ENCOUNTER — Ambulatory Visit (INDEPENDENT_AMBULATORY_CARE_PROVIDER_SITE_OTHER): Payer: Medicaid Other | Admitting: Internal Medicine

## 2018-10-14 ENCOUNTER — Telehealth: Payer: Self-pay | Admitting: Internal Medicine

## 2018-10-14 ENCOUNTER — Encounter: Payer: Self-pay | Admitting: Internal Medicine

## 2018-10-14 VITALS — BP 118/80 | HR 73 | Ht 71.0 in | Wt 245.2 lb

## 2018-10-14 DIAGNOSIS — Z95810 Presence of automatic (implantable) cardiac defibrillator: Secondary | ICD-10-CM | POA: Diagnosis not present

## 2018-10-14 DIAGNOSIS — I472 Ventricular tachycardia, unspecified: Secondary | ICD-10-CM

## 2018-10-14 DIAGNOSIS — I5022 Chronic systolic (congestive) heart failure: Secondary | ICD-10-CM

## 2018-10-14 DIAGNOSIS — R0602 Shortness of breath: Secondary | ICD-10-CM | POA: Diagnosis not present

## 2018-10-14 MED ORDER — TORSEMIDE 20 MG PO TABS: 80.0000 mg | ORAL_TABLET | Freq: Two times a day (BID) | ORAL | 3 refills | Status: DC

## 2018-10-14 MED ORDER — ALBUTEROL SULFATE HFA 108 (90 BASE) MCG/ACT IN AERS: 2.0000 | INHALATION_SPRAY | Freq: Four times a day (QID) | RESPIRATORY_TRACT | 3 refills | Status: AC | PRN

## 2018-10-14 MED ORDER — TORSEMIDE 20 MG PO TABS: ORAL_TABLET | ORAL | 3 refills | Status: DC

## 2018-10-14 NOTE — Progress Notes (Signed)
HPI The patient is a 58 yo woman with a h/o chronic systolic heart failure, VT, VF, s/p ICD insertion. She has recently had ICD shocks and was placed on amiodarone. Her ventricular arrhythmias have been quiet. She has not had any side effects from her amiodarone. No edema. No chest pain. She has been taking her torsemide 80 mg in the a.m and 40 mg in the p.m every other day and her fluid status has been good. She does admit to dietary indiscretion. She has been eating peach ice cream. Allergies  Allergen Reactions  . Aspirin Other (See Comments)  . Penicillins Itching    Can take amoxicillin without a reaction  . Shellfish Allergy Other (See Comments)    Causes to have grand mal seizures     Current Outpatient Medications  Medication Sig Dispense Refill  . acetaminophen (TYLENOL) 325 MG tablet Take 1-2 tablets (325-650 mg total) by mouth every 4 (four) hours as needed for mild pain.    Marland Kitchen allopurinol (ZYLOPRIM) 100 MG tablet Take 2 tablets by mouth daily.    Marland Kitchen alum & mag hydroxide-simeth (MAALOX/MYLANTA) 200-200-20 MG/5ML suspension Take 30 mLs by mouth every 6 (six) hours as needed for indigestion or heartburn. 355 mL 0  . Amino Acids-Protein Hydrolys (FEEDING SUPPLEMENT, PRO-STAT SUGAR FREE 64,) LIQD Take 30 mLs by mouth 2 (two) times daily. 887 mL 0  . amiodarone (PACERONE) 200 MG tablet TAKE 1 TABLET BY MOUTH TWICE DAILY 180 tablet 3  . BIDIL 20-37.5 MG tablet TAKE (2) TABLETS BY MOUTH THREE TIMES DAILY. 168 tablet 5  . cetirizine (ZYRTEC) 10 MG tablet Take 1 tablet (10 mg total) by mouth daily. 30 tablet 0  . clopidogrel (PLAVIX) 75 MG tablet Take 1 tablet (75 mg total) by mouth daily. 30 tablet 0  . COD LIVER OIL PO Take 1 tablet by mouth daily.     . digoxin (LANOXIN) 0.125 MG tablet Take 0.5 tablets by mouth daily.    Marland Kitchen dimenhyDRINATE (DRAMAMINE) 50 MG tablet Take 0.5 tablets (25 mg total) by mouth every 8 (eight) hours as needed for dizziness. 30 tablet 0  . docusate sodium  (COLACE) 100 MG capsule Take 1 capsule (100 mg total) by mouth 2 (two) times daily. 60 capsule 0  . guaiFENesin (ROBITUSSIN) 100 MG/5ML SOLN Take 5 mLs (100 mg total) by mouth every 4 (four) hours as needed for cough or to loosen phlegm. 236 mL 0  . hydrocortisone cream 0.5 % Apply topically 3 (three) times daily as needed for itching. 30 g 0  . hydroxychloroquine (PLAQUENIL) 200 MG tablet Take 1 tablet (200 mg total) by mouth 2 (two) times daily. 60 tablet 1  . MITIGARE 0.6 MG CAPS TAKE (1) CAPSULE BY MOUTH ONCE DAILY. 28 capsule 11  . Multiple Vitamins-Minerals (MULTIVITAMIN WOMEN 50+ PO) Take 1 tablet by mouth daily.    . polyethylene glycol (MIRALAX / GLYCOLAX) packet Take 17 g by mouth daily. 14 each 0  . potassium chloride SA (K-DUR) 20 MEQ tablet Take 1 tablet (20 mEq total) by mouth 2 (two) times daily. 60 tablet 6  . RANEXA 500 MG 12 hr tablet TAKE 1 TABLET BY MOUTH TWICE DAILY 56 tablet 11  . spironolactone (ALDACTONE) 25 MG tablet Take 1 tablet (25 mg total) by mouth daily. 30 tablet 5  . torsemide (DEMADEX) 20 MG tablet Take 80mg  every morning and 40mg  every other afternoon. 180 tablet 5  . venlafaxine XR (EFFEXOR-XR) 150 MG 24  hr capsule Take 1 capsule (150 mg total) by mouth daily. 30 capsule 1  . mometasone-formoterol (DULERA) 200-5 MCG/ACT AERO Inhale 2 puffs into the lungs 2 (two) times daily. 8.8 g 3   No current facility-administered medications for this visit.      Past Medical History:  Diagnosis Date  . Anxiety   . Arthritis   . Asthma   . Bronchitis   . CHF (congestive heart failure) (HCC)    a. EF 20-25% by cath in 11/2010 with cath showing normal cors  . CKD (chronic kidney disease) stage 3, GFR 30-59 ml/min (HCC)   . Fibromyalgia   . GERD (gastroesophageal reflux disease)   . Hallux valgus of left foot   . Hay fever   . Headache(784.0)   . History of pneumonia   . History of seizures   . Hyperlipidemia   . Hypertension   . Nonischemic cardiomyopathy (HCC)    . Obesity   . Stroke (HCC)   . Systemic lupus erythematosus (HCC)   . Type 2 diabetes mellitus (HCC)     ROS:   All systems reviewed and negative except as noted in the HPI.   Past Surgical History:  Procedure Laterality Date  . ICD  02/13/2011   Medtronic Protecta DR XT  . ICD GENERATOR CHANGEOUT N/A 01/07/2017   Procedure: ICD GENERATOR CHANGEOUT;  Surgeon: Marinus Maw, MD;  Location: [facility] INVASIVE CV LAB;  Service: Cardiovascular;  Laterality: N/A;  . IMPLANTABLE CARDIOVERTER DEFIBRILLATOR IMPLANT N/A 02/13/2011   Procedure: IMPLANTABLE CARDIOVERTER DEFIBRILLATOR IMPLANT;  Surgeon: Thurmon Fair, MD;  Location: [facility] CATH LAB;  Service: Cardiovascular;  Laterality: N/A;  . LEFT HEART CATHETERIZATION WITH CORONARY ANGIOGRAM N/A 12/05/2010   Procedure: LEFT HEART CATHETERIZATION WITH CORONARY ANGIOGRAM;  Surgeon: Chrystie Nose, MD;  Location: [facility] CATH LAB;  Service: Cardiovascular;  Laterality: N/A;  . RIGHT ANKLE    . TUBAL LIGATION    . US ECHOCARDIOGRAPHY  11/09/2010   mild LV enlargement w/conc. LVH & severe global hypokinesis EF 30-35%,mod. diastolic dysfunction, mod. TR,mild AI,mild to mod MR,mild PI     Family History  Problem Relation Age of Onset  . Heart failure Mother   . Diabetes Father   . Hypertension Sister   . Hypertension Brother   . Hypertension Daughter      Social History   Socioeconomic History  . Marital status: Legally Separated    Spouse name: Not on file  . Number of children: Not on file  . Years of education: Not on file  . Highest education level: Not on file  Occupational History  . Not on file  Social Needs  . Financial resource strain: Not very hard  . Food insecurity    Worry: Never true    Inability: Never true  . Transportation needs    Medical: No    Non-medical: No  Tobacco Use  . Smoking status: Former Smoker    Packs/day: 1.00    Years: 26.00    Pack years: 26.00    Types: Cigarettes    Start date: 07/01/1979     Quit date: 10/03/2005    Years since quitting: 13.0  . Smokeless tobacco: Never Used  Substance and Sexual Activity  . Alcohol use: Not Currently    Alcohol/week: 0.0 standard drinks    Comment: quit 2007  . Drug use: No  . Sexual activity: Yes  Lifestyle  . Physical activity    Days per week: Patient refused  Minutes per session: Patient refused  . Stress: Only a little  Relationships  . Social Musician on phone: Patient refused    Gets together: Patient refused    Attends religious service: Patient refused    Active member of club or organization: Patient refused    Attends meetings of clubs or organizations: Patient refused    Relationship status: Patient refused  . Intimate partner violence    Fear of current or ex partner: No    Emotionally abused: No    Physically abused: No    Forced sexual activity: No  Other Topics Concern  . Not on file  Social History Narrative  . Not on file     BP 118/80   Pulse 73   Ht 5\' 11"  (1.803 m)   Wt 245 lb 3.2 oz (111.2 kg)   LMP 01/30/2011   SpO2 97%   BMI 34.20 kg/m   Physical Exam:  Well appearing NAD HEENT: Unremarkable Neck:  No JVD, no thyromegally Lymphatics:  No adenopathy Back:  No CVA tenderness Lungs:  Clear with no wheezes HEART:  Regular rate rhythm, no murmurs, no rubs, no clicks Abd:  soft, positive bowel sounds, no organomegally, no rebound, no guarding Ext:  2 plus pulses, no edema, no cyanosis, no clubbing Skin:  No rashes no nodules Neuro:  CN II through XII intact, motor grossly intact  EKG - nsr with LBBB  DEVICE  Normal device function.  See PaceArt for details.   Assess/Plan: 1. VT/VF - She will continue her amiodarone. She has had no recent episodes of VT 2. ICD - her medtronic DDD ICD is workign normally. 3. Chronic systolic heart failure - her fluid index is good. She will continue the torsemide. 4.  Obesity - she has gained 15 lbs in the past 6 months. I have strongly  encouraged her to reduce her oral intake.  03/30/2011, M.D.

## 2018-10-14 NOTE — Telephone Encounter (Signed)
New message   Pt c/o medication issue:  1. Name of Medication: torsemide (DEMADEX) 20 MG tablet  2. How are you currently taking this medication (dosage and times per day)? n/a  3. Are you having a reaction (difficulty breathing--STAT)?n/a  4. What is your medication issue? Katherine [name] states that there is a discrepancy in the instructions for this medication. Please call.

## 2018-10-14 NOTE — Patient Instructions (Addendum)
Medication Instructions:  Your physician has recommended you make the following change in your medication:   1.  Increase your torsemide---Take 4 tablets (80 mg) by mouth twice a day  Labwork: None ordered.  Testing/Procedures: None ordered.  Follow-Up:  You will follow up with the device clinic in 3 months for a defibrillator check.  Your physician wants you to follow-up in: one year with Dr. Lovena Le.   You will receive a reminder letter in the mail two months in advance. If you don't receive a letter, please call our office to schedule the follow-up appointment.   Any Other Special Instructions Will Be Listed Below (If Applicable).  If you need a refill on your cardiac medications before your next appointment, please call your pharmacy.

## 2018-10-14 NOTE — Telephone Encounter (Signed)
Attempted to return call to pharmacy.  First call was disconnected.  2nd call was put on hold for 10 minutes with no pick up.  Resent correct prescription.

## 2018-10-25 ENCOUNTER — Other Ambulatory Visit: Payer: Self-pay | Admitting: Internal Medicine

## 2018-10-26 ENCOUNTER — Encounter: Payer: Medicaid Other | Admitting: Internal Medicine

## 2018-11-04 ENCOUNTER — Other Ambulatory Visit (HOSPITAL_COMMUNITY): Payer: Self-pay | Admitting: Cardiology

## 2018-12-05 ENCOUNTER — Other Ambulatory Visit (HOSPITAL_COMMUNITY): Payer: Self-pay | Admitting: Cardiology

## 2018-12-05 ENCOUNTER — Ambulatory Visit (INDEPENDENT_AMBULATORY_CARE_PROVIDER_SITE_OTHER): Payer: Medicaid Other | Admitting: *Deleted

## 2018-12-05 DIAGNOSIS — I472 Ventricular tachycardia, unspecified: Secondary | ICD-10-CM

## 2018-12-05 DIAGNOSIS — I5022 Chronic systolic (congestive) heart failure: Secondary | ICD-10-CM

## 2018-12-07 ENCOUNTER — Other Ambulatory Visit: Payer: Self-pay | Admitting: Internal Medicine

## 2018-12-07 DIAGNOSIS — I428 Other cardiomyopathies: Secondary | ICD-10-CM

## 2018-12-07 LAB — CUP PACEART REMOTE DEVICE CHECK
Battery Remaining Longevity: 51 mo
Battery Voltage: 2.97 V
Brady Statistic AP VP Percent: 0.53 %
Brady Statistic AP VS Percent: 9.44 %
Brady Statistic AS VP Percent: 0.47 %
Brady Statistic AS VS Percent: 89.56 %
Brady Statistic RA Percent Paced: 10.05 %
Brady Statistic RV Percent Paced: 0.64 %
Date Time Interrogation Session: 20201116182713
HighPow Impedance: 41 Ohm
HighPow Impedance: 50 Ohm
Implantable Lead Implant Date: 20130125
Implantable Lead Implant Date: 20130125
Implantable Lead Location: 753859
Implantable Lead Location: 753860
Implantable Lead Model: 5076
Implantable Lead Model: 6947
Implantable Pulse Generator Implant Date: 20181220
Lead Channel Impedance Value: 323 Ohm
Lead Channel Impedance Value: 380 Ohm
Lead Channel Impedance Value: 399 Ohm
Lead Channel Pacing Threshold Amplitude: 0.625 V
Lead Channel Pacing Threshold Amplitude: 0.75 V
Lead Channel Pacing Threshold Pulse Width: 0.4 ms
Lead Channel Pacing Threshold Pulse Width: 0.4 ms
Lead Channel Sensing Intrinsic Amplitude: 1.375 mV
Lead Channel Sensing Intrinsic Amplitude: 1.375 mV
Lead Channel Sensing Intrinsic Amplitude: 10.625 mV
Lead Channel Sensing Intrinsic Amplitude: 10.625 mV
Lead Channel Setting Pacing Amplitude: 1.5 V
Lead Channel Setting Pacing Amplitude: 2.5 V
Lead Channel Setting Pacing Pulse Width: 0.4 ms
Lead Channel Setting Sensing Sensitivity: 0.6 mV

## 2018-12-29 ENCOUNTER — Other Ambulatory Visit: Payer: Self-pay | Admitting: Internal Medicine

## 2018-12-29 ENCOUNTER — Other Ambulatory Visit (HOSPITAL_COMMUNITY): Payer: Self-pay | Admitting: Cardiology

## 2018-12-30 ENCOUNTER — Telehealth: Payer: Self-pay | Admitting: Internal Medicine

## 2018-12-30 NOTE — Telephone Encounter (Signed)
CareAlert received for HV therapy delivered 12/10 around 6PM.  Appropriate therapy for VT terminated after 2 HV shocks. Optivol also elevated.  Pt currently admitted in [place], [place].  I have made her an appt next week with Renee for close outpatient follow up.  Chanetta Marshall, NP 12/30/2018 10:00 AM

## 2018-12-30 NOTE — Progress Notes (Signed)
Remote ICD transmission.   

## 2019-01-02 ENCOUNTER — Telehealth: Payer: Self-pay | Admitting: *Deleted

## 2019-01-02 NOTE — Telephone Encounter (Signed)
Attempted to reach patient, call went straight to VM. Did not leave additional message. No DPR on file.

## 2019-01-02 NOTE — Telephone Encounter (Addendum)
LMOVM requesting call back to [place]. Direct number and office hours provided.  Carelink alert received for treated VF episode on 27-Jan-2019 at 19:38, polymorphic VT terminated with 35J shock x1. Episode preceded by polymorphic VT hovering in VT zone beginning at 19:37 (stored as VT-NS episodes). Per phone note from 12/30/18, pt was recently admitted in [place], [place] after [place] therapy on 12/10. Pt has f/u with Jens Som, PA, on 01/04/19. Presenting EGM as of time of transmission (19:38:39) shows AP/VP rhythm at 80bpm.

## 2019-01-03 NOTE — Telephone Encounter (Signed)
Contacted Carelink tech support as most recent episode from 01-15-2019 shows up only in [place] and not in [place]. Per Elisa, patient's monitor association has somehow changed and she will need a new Carelink monitor. Will make patient aware at visit on 01/04/19.  Attempted to reach [facility]) due to unsuccessful attempts to reach patient. Received automated message that calling restrictions are in place and phone is not accepting calls.

## 2019-01-03 NOTE — Progress Notes (Deleted)
Cardiology Office Note Date:  01/03/2019  Patient ID:  Katherine [name], DOB [birth year], MRN [id number] PCP:  Alvina FilbertHunter, Denise, MD  Cardiologist:  ***  ***refresh   Chief Complaint: *** ICD therapies  History of Present Illness: Katherine [name] is a 58 y.o. 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 Ladona Ridgelaylor.  Last seen by him Sept 2020, at that time she had recently had appropriate shocks for VF episode in July.  She also had VT in a monitor zone.  She was started on amiodarone and VT zone added at 176-200bpm, 32 initial beats, Rx 1 burst x3, Rx 2 ramp x3, Rx 3-6 35J CV, VT monitor zone lowered to 150bpm. When she saw Dr. Ladona Ridgelaylor in Sept she had not had further VT, was euvolemic and no changes were made.  Chart notes 12/30/2018  CareAlert received for HV therapy delivered 12/10 around 6PM.  Appropriate therapy for VT terminated after 2 HV shocks. Optivol also elevated.  [place] made appt for follow up  01/02/2019 Carelink alert received for treated VF episode on 12-07-18 at 19:38, polymorphic VT terminated with 35J shock x1. Episode preceded by polymorphic VT hovering in VT zone beginning at 19:37 (stored as VT-NS episodes). Per phone note from 12/30/18, [place] recently [place] HV therapy on 12/10. Pt has f/u with Almyra Free. Sharone Picchi, PA, on 01/04/19. Presenting EGM as of time of transmission (19:38:39) shows AP/VP rhythm at 80bpm. Device RN called regarding above left a message, a follow up call went straight to VM.  *** Syncope? *** [facility] *** meds *** mo driving *** amio *** last ischemic eval??  Device information MDT dual chamber ICD, implanted 02/13/2011 >> gen change  01/07/17 + hx of appropriate therapies AAD Amiodarone started July 2020 >> ***  current    Past Medical History:  Diagnosis Date  . Anxiety   . Arthritis   . Asthma   . Bronchitis   . CHF (congestive heart failure) (HCC)    a. EF 20-25% by cath in 11/2010 with cath showing normal cors  . CKD (chronic kidney disease) stage 3, GFR 30-59 ml/min (HCC)   . Fibromyalgia   . GERD (gastroesophageal reflux disease)   . Hallux valgus of left foot   . Hay fever   . Headache(784.0)   . History of pneumonia   . History of seizures   . Hyperlipidemia   . Hypertension   . Nonischemic cardiomyopathy (HCC)   . Obesity   . Stroke (HCC)   . Systemic lupus erythematosus (HCC)   . Type 2 diabetes mellitus (HCC)     Past Surgical History:  Procedure Laterality Date  . ICD  02/13/2011   Medtronic Protecta DR XT  . ICD GENERATOR CHANGEOUT N/A 01/07/2017   Procedure: ICD GENERATOR CHANGEOUT;  Surgeon: Marinus Mawaylor, Gregg W, MD;  [facility];  Service: Cardiovascular;  Laterality: N/A;  . IMPLANTABLE CARDIOVERTER DEFIBRILLATOR IMPLANT N/A 02/13/2011   Procedure: IMPLANTABLE CARDIOVERTER DEFIBRILLATOR IMPLANT;  Surgeon: Thurmon FairMihai Croitoru, MD;  Location: [facility] CATH LAB;  Service: Cardiovascular;  Laterality: N/A;  . LEFT HEART CATHETERIZATION WITH CORONARY ANGIOGRAM N/A 12/05/2010   Procedure: LEFT HEART CATHETERIZATION WITH CORONARY ANGIOGRAM;  Surgeon: Chrystie NoseKenneth C. Hilty, MD;  [facility];  Service: Cardiovascular;  Laterality:  N/A;  . RIGHT ANKLE    . TUBAL LIGATION    . US ECHOCARDIOGRAPHY  11/09/2010   mild LV enlargement w/conc. LVH & severe global hypokinesis EF 30-35%,mod. diastolic dysfunction, mod. TR,mild AI,mild to mod MR,mild PI    Current Outpatient Medications  Medication Sig Dispense Refill  . acetaminophen (TYLENOL) 325 MG tablet Take 1-2 tablets (325-650 mg total) by mouth every 4 (four) hours as needed for mild pain.    Marland Kitchen albuterol (VENTOLIN HFA) 108 (90 Base) MCG/ACT inhaler Inhale 2 puffs into the lungs every 6 (six) hours as needed for  wheezing or shortness of breath. 8 g 3  . allopurinol (ZYLOPRIM) 100 MG tablet Take 2 tablets by mouth daily.    Marland Kitchen alum & mag hydroxide-simeth (MAALOX/MYLANTA) 200-200-20 MG/5ML suspension Take 30 mLs by mouth every 6 (six) hours as needed for indigestion or heartburn. 355 mL 0  . Amino Acids-Protein Hydrolys (FEEDING SUPPLEMENT, PRO-STAT SUGAR FREE 64,) LIQD Take 30 mLs by mouth 2 (two) times daily. 887 mL 0  . amiodarone (PACERONE) 200 MG tablet Take 1 tablet (200 mg total) by mouth 2 (two) times daily. 60 tablet 9  . BIDIL 20-37.5 MG tablet TAKE (2) TABLETS BY MOUTH THREE TIMES DAILY. 168 tablet 5  . cetirizine (ZYRTEC) 10 MG tablet Take 1 tablet (10 mg total) by mouth daily. 30 tablet 0  . clopidogrel (PLAVIX) 75 MG tablet TAKE 1 TABLET BY MOUTH ONCE DAILY. 28 tablet 11  . COD LIVER OIL PO Take 1 tablet by mouth daily.     . digoxin (LANOXIN) 0.125 MG tablet TAKE (1/2) TABLET BY MOUTH ONCE DAILY. 14 tablet 11  . dimenhyDRINATE (DRAMAMINE) 50 MG tablet Take 0.5 tablets (25 mg total) by mouth every 8 (eight) hours as needed for dizziness. 30 tablet 0  . docusate sodium (COLACE) 100 MG capsule Take 1 capsule (100 mg total) by mouth 2 (two) times daily. 60 capsule 0  . guaiFENesin (ROBITUSSIN) 100 MG/5ML SOLN Take 5 mLs (100 mg total) by mouth every 4 (four) hours as needed for cough or to loosen phlegm. 236 mL 0  . hydrocortisone cream 0.5 % Apply topically 3 (three) times daily as needed for itching. 30 g 0  . hydroxychloroquine (PLAQUENIL) 200 MG tablet Take 1 tablet (200 mg total) by mouth 2 (two) times daily. 60 tablet 1  . MITIGARE 0.6 MG CAPS TAKE (1) CAPSULE BY MOUTH ONCE DAILY. 28 capsule 11  . mometasone-formoterol (DULERA) 200-5 MCG/ACT AERO Inhale 2 puffs into the lungs 2 (two) times daily. 8.8 g 3  . Multiple Vitamins-Minerals (MULTIVITAMIN WOMEN 50+ PO) Take 1 tablet by mouth daily.    . polyethylene glycol (MIRALAX / GLYCOLAX) packet Take 17 g by mouth daily. 14 each 0  . potassium  chloride SA (KLOR-CON) 20 MEQ tablet TAKE 1 TABLET BY MOUTH TWICE DAILY 56 tablet 11  . RANEXA 500 MG 12 hr tablet TAKE 1 TABLET BY MOUTH TWICE DAILY 56 tablet 11  . spironolactone (ALDACTONE) 25 MG tablet TAKE 1 TABLET BY MOUTH ONCE DAILY. 28 tablet 11  . torsemide (DEMADEX) 20 MG tablet TAKE 4 TABLETS BY MOUTH TWICE DAILY 240 tablet 9  . venlafaxine XR (EFFEXOR-XR) 150 MG 24 hr capsule Take 1 capsule (150 mg total) by mouth daily. 30 capsule 1   No current facility-administered medications for this visit.    Allergies:   Aspirin, Penicillins, and Shellfish allergy   Social History:  The patient  reports that she quit smoking  about 13 years ago. Her smoking use included cigarettes. She started smoking about 39 years ago. She has a 26.00 pack-year smoking history. She has never used smokeless tobacco. She reports previous alcohol use. She reports that she does not use drugs.   Family History:  The patient's family history includes Diabetes in her father; Heart failure in her mother; Hypertension in her brother, daughter, and sister.***  ROS:  Please see the history of present illness. Otherwise, review of systems is positive for ***.   All other systems are reviewed and otherwise negative.   PHYSICAL EXAM: *** VS:  LMP 01/30/2011  BMI: There is no height or weight on file to calculate BMI. Well nourished, well developed, in no acute distress  HEENT: normocephalic, atraumatic  Neck: no JVD, carotid bruits or masses Cardiac:  normal S1, S2; RRR; no significant murmurs, no rubs, or gallops Lungs:  clear to auscultation bilaterally, no wheezing, rhonchi or rales  Abd: soft, nontender,  + BS MS: no deformity or atrophy Ext: no edema  Skin: warm and dry, no rash Neuro:  No gross deficits appreciated Psych: euthymic mood, full affect  *** PPM/ICD site is stable, no tethering or discomfort   EKG:  Done today shows ***   01/13/2018: TTE Study Conclusions - Left ventricle: The cavity  size was moderately dilated. Wall   thickness was increased in a pattern of mild LVH. Systolic   function was severely reduced. The estimated ejection fraction   was in the range of 20% to 25%. Diffuse hypokinesis. Features are   consistent with a pseudonormal left ventricular filling pattern,   with concomitant abnormal relaxation and increased filling   pressure (grade 2 diastolic dysfunction). - Ventricular septum: Septal motion showed abnormal function and   dyssynergy. - Aortic valve: Mildly calcified annulus. Trileaflet; mildly   calcified leaflets. There was mild regurgitation. - Mitral valve: Mildly calcified annulus. There was mild   regurgitation. - Left atrium: The atrium was mildly dilated. - Right ventricle: The cavity size was mildly dilated. Pacer wire   or catheter noted in right ventricle. Systolic function was   mildly reduced. - Right atrium: The atrium was mildly dilated. - Tricuspid valve: There was mild regurgitation. - Pulmonary arteries: Systolic pressure was moderately increased.   PA peak pressure: 46 mm Hg (S). - Pericardium, extracardiac: A trivial pericardial effusion was   identified.   12/25/2010: c.MRI IMPRESSION: 1. Moderate LV dilation with severe global hypokinesis, EF 28%. 2. No myocardial delayed enhancement, so no definitive evidence for prior MI, infiltrative disease, or myocarditis.  Hospital AHF consult note makes reference to cath in 2012 with no CAD Cath in 11/12 with normal coronaries  Recent Labs: 03/24/2018: B Natriuretic Peptide 893.2 03/25/2018: ALT 26 04/11/2018: Hemoglobin 13.1; Platelets 403 05/27/2018: BUN 26; Creatinine, Ser 1.91; Magnesium 2.4; Potassium 4.0; Sodium 142  No results found for requested labs within last 8760 hours.   CrCl cannot be calculated (Patient's most recent lab result is older than the maximum 21 days allowed.).   Wt Readings from Last 3 Encounters:  10/14/18 245 lb 3.2 oz (111.2 kg)  07/12/18 230 lb  (104.3 kg)  05/17/18 230 lb (104.3 kg)     Other studies reviewed: Additional studies/records reviewed today include: summarized above  ASSESSMENT AND PLAN:  1. NICM 2. Chronic CHF (systolic)     ***  3. VT/PMVT/VF     ***      *** no driving  4. ICD     ***  Disposition: F/u with ***  Current medicines are reviewed at length with the patient today.  The patient did not have any concerns regarding medicines.***  Signed, Tommye Standard, PA-C 01/03/2019 5:59 AM     [facility] [address] [place] [address] [phone] (office)  [phone] (fax)

## 2019-01-03 NOTE — Telephone Encounter (Signed)
Call went to voicemail and LMOVM to call back DC.

## 2019-01-04 ENCOUNTER — Encounter: Payer: Medicaid Other | Admitting: Physician Assistant

## 2019-01-04 NOTE — Telephone Encounter (Signed)
LMOVM for patient requesting call back to DC.  Due to multiple unsuccessful attempts to reach patient, contacted [facility] Department for welfare check.   Received call back from dispatcher--EMS was called to patient's address on 01-04-19, patient was coded and ultimately expired later that evening at hospital in [place], [place].   Dr. Lovena Le made aware. Routed to Dr. Aundra Dubin as Juluis Rainier.

## 2019-01-11 NOTE — Telephone Encounter (Signed)
completed

## 2019-01-11 NOTE — Telephone Encounter (Signed)
Made in error

## 2019-01-20 DEATH — deceased

## 2019-01-26 ENCOUNTER — Encounter (HOSPITAL_COMMUNITY): Payer: Medicaid Other | Admitting: Cardiology

## 2020-01-07 IMAGING — US US ABDOMEN LIMITED
1 series · 14 of 25 positions shown · non-contrast
Comparison: None.

CLINICAL DATA: Elevated bilirubin

EXAM:
ULTRASOUND ABDOMEN LIMITED RIGHT UPPER QUADRANT

[Series 1: us abdomen limited · 14 of 84 slices shown]
[im 1/84]
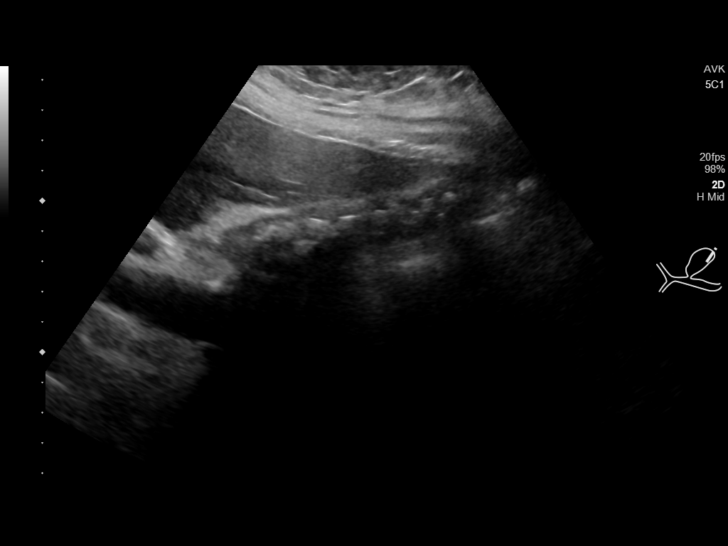
[im 7/84]
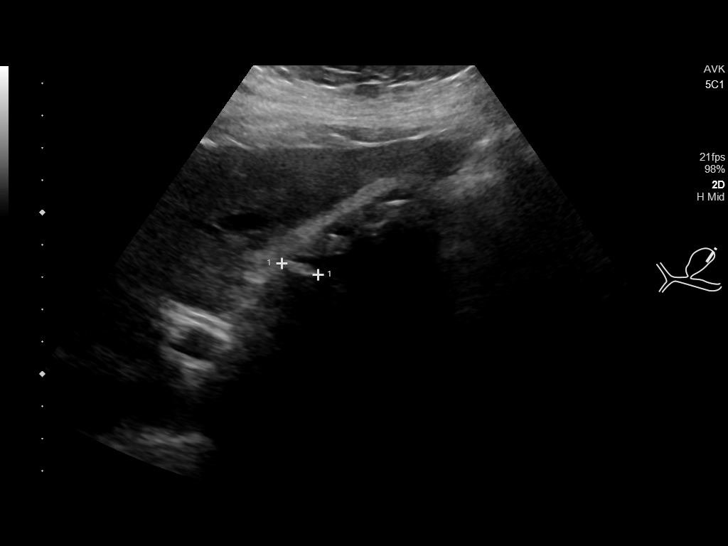
[im 14/84]
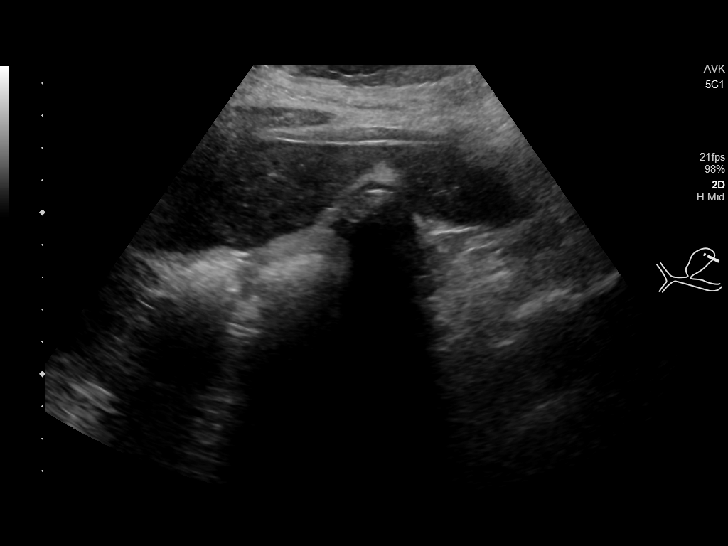
[im 21/84]
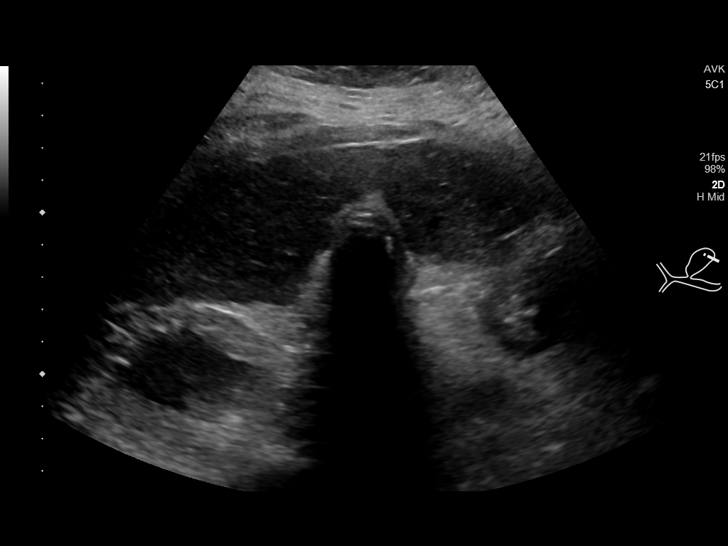
[im 28/84]
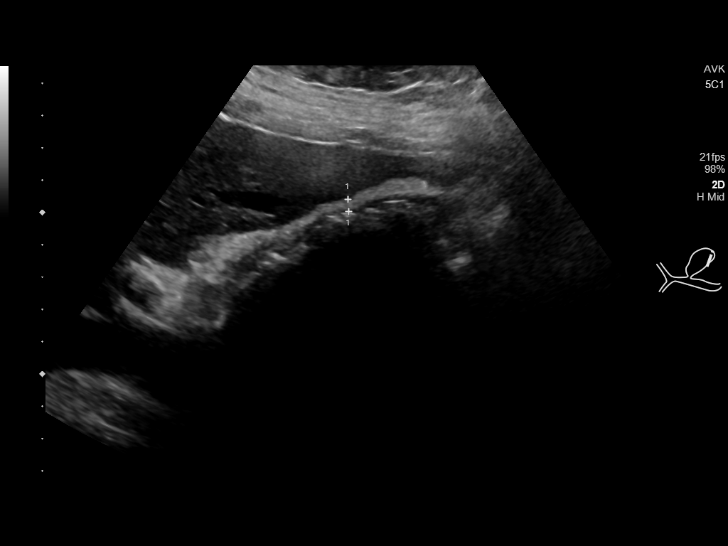
[im 32/84]
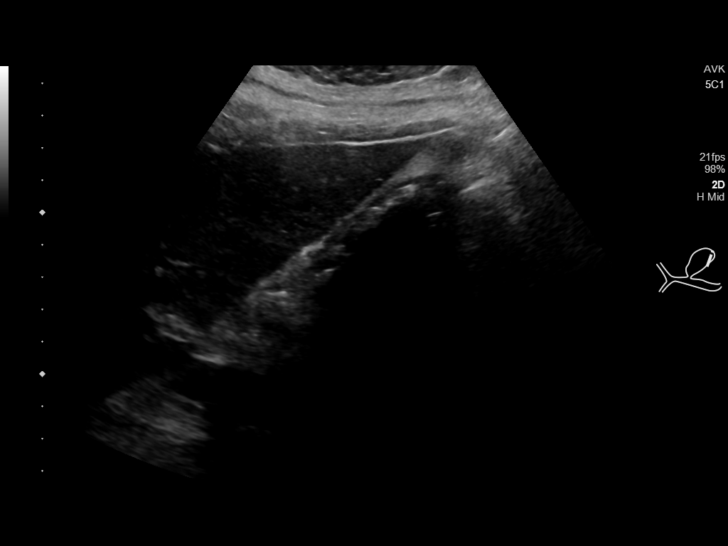
[im 39/84]
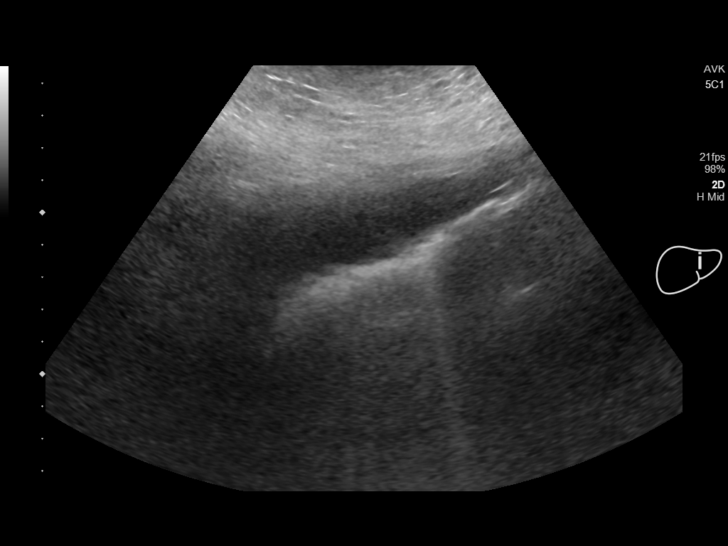
[im 45/84]
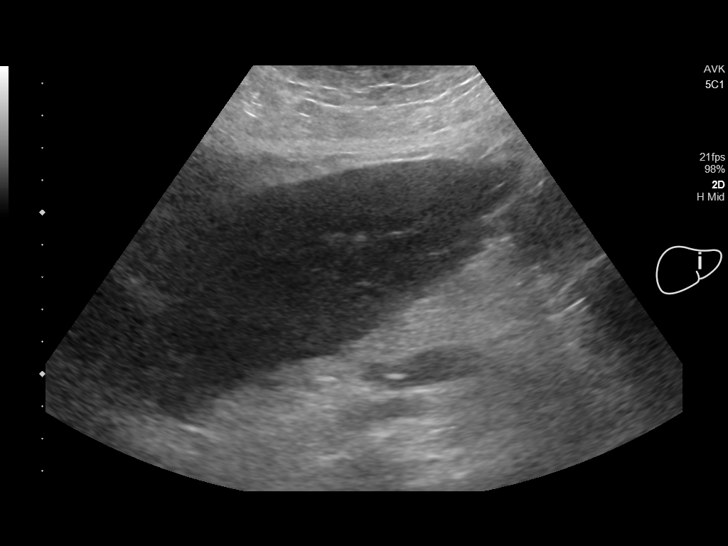
[im 52/84]
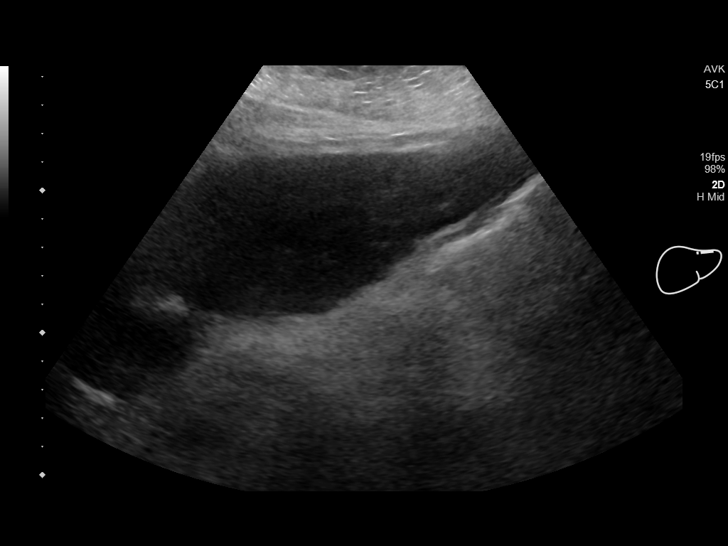
[im 56/84]
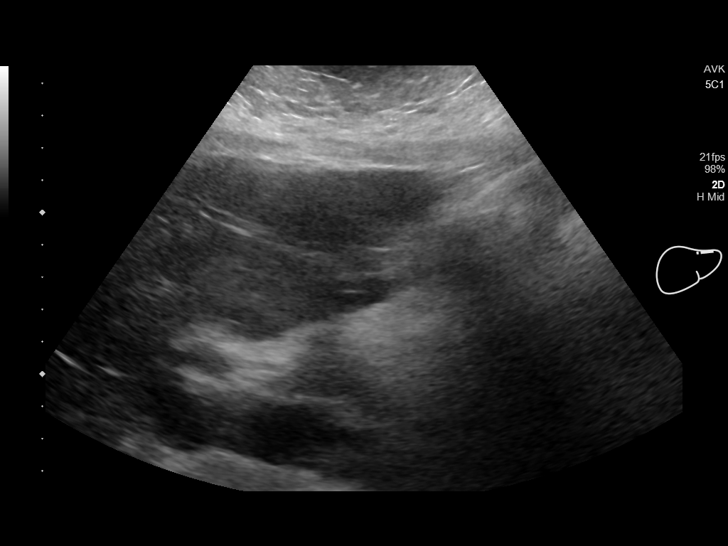
[im 63/84]
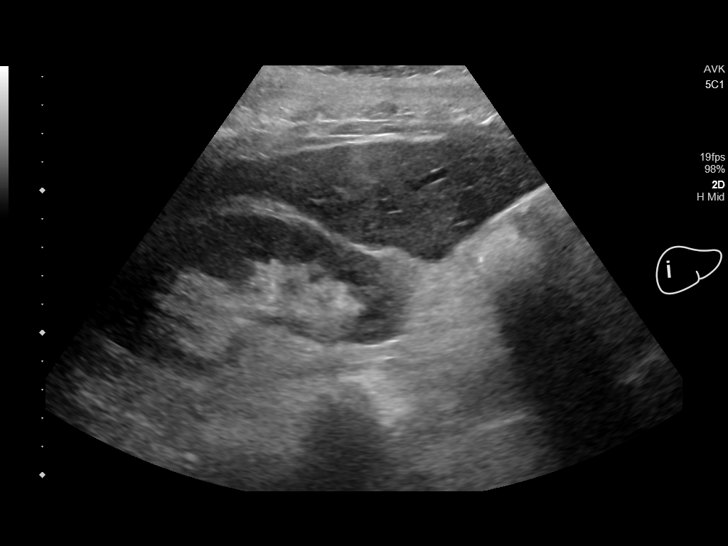
[im 70/84]
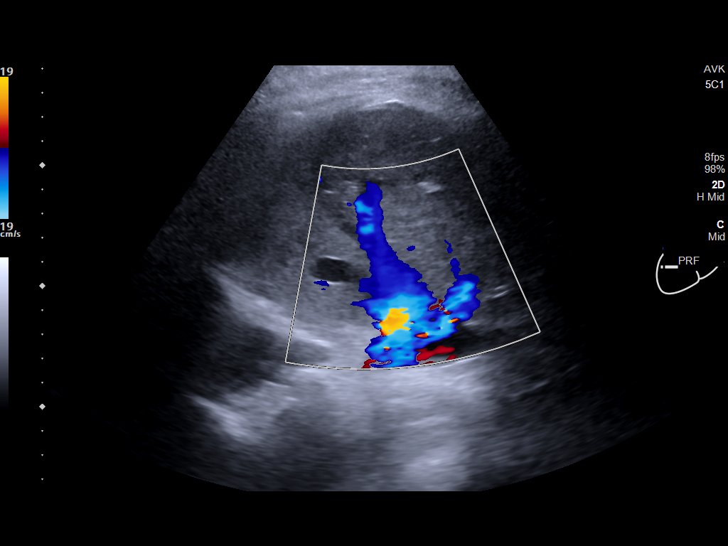
[im 77/84]
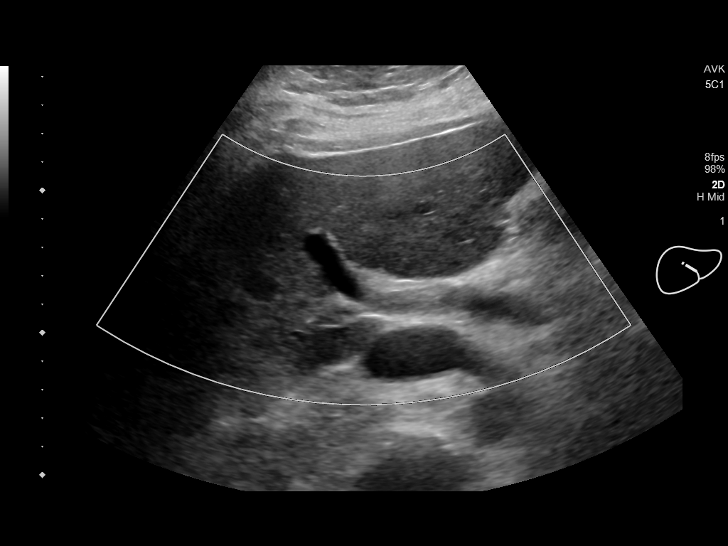
[im 84/84]
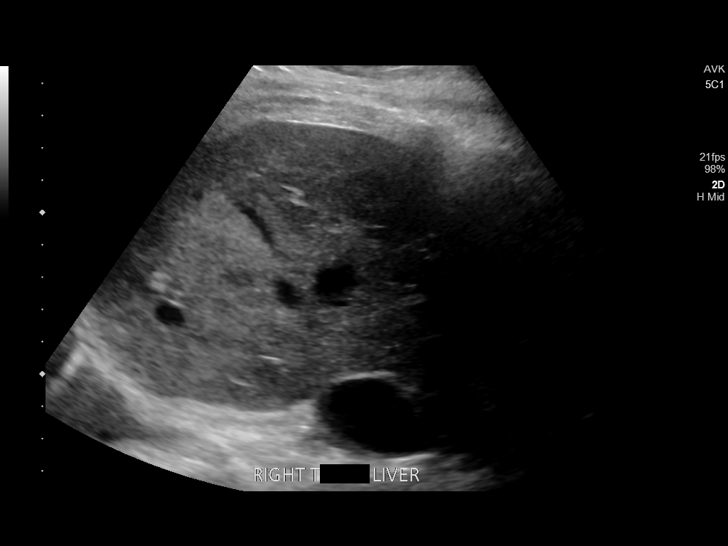

[14 of 25 positions shown; findings below may reference images not displayed]

FINDINGS: Gallbladder:

Gallbladder is filled with cholelithiasis. Gallbladder wall
thickness measures approximately 6 mm. Largest calculus measures 12
mm. No pericholecystic fluid. Negative sonographic Murphy sign.

Common bile duct:

Diameter: 4 mm

Liver:

No focal lesion identified. Increased hepatic parenchymal
echogenicity. Portal vein is patent on color Doppler imaging with
normal direction of blood flow towards the liver.
IMPRESSION: 1. Gallbladder filled with cholelithiasis with mild gallbladder wall
thickening, but without pericholecystic fluid or a sonographic
Murphy sign. No sonographic evidence of acute cholecystitis.
2. Increased hepatic echogenicity as can be seen with hepatic
steatosis.

## 2020-01-07 IMAGING — CR DG CHEST 2V
1 series · 2 of 2 positions shown · non-contrast
Comparison: 03/15/2017

CLINICAL DATA: Weakness for several months

EXAM:
CHEST - 2 VIEW

[Series 1: dg chest 2 view · 0.14mm/px · 2 of 2 slices shown]
[im 1/2]
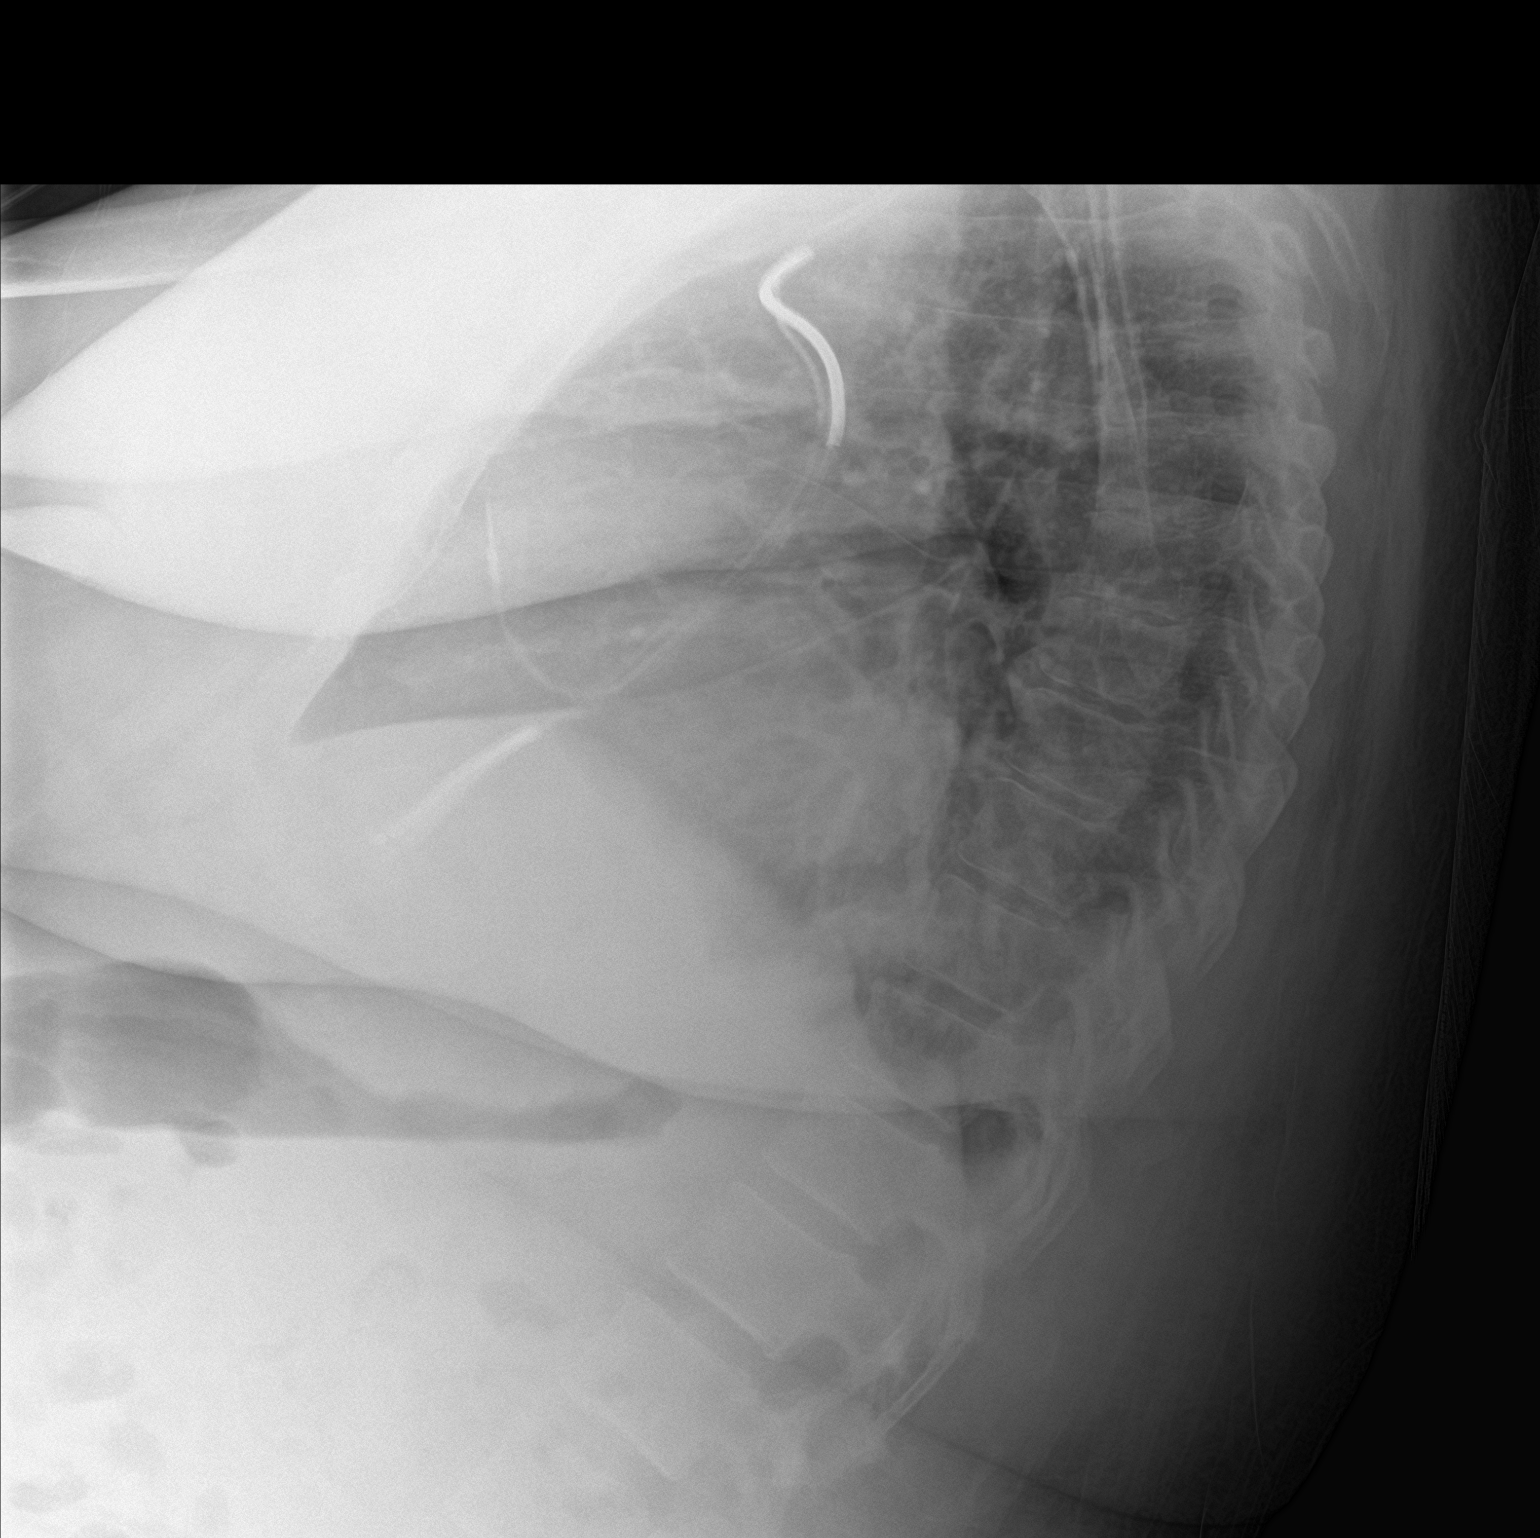
[im 2/2]
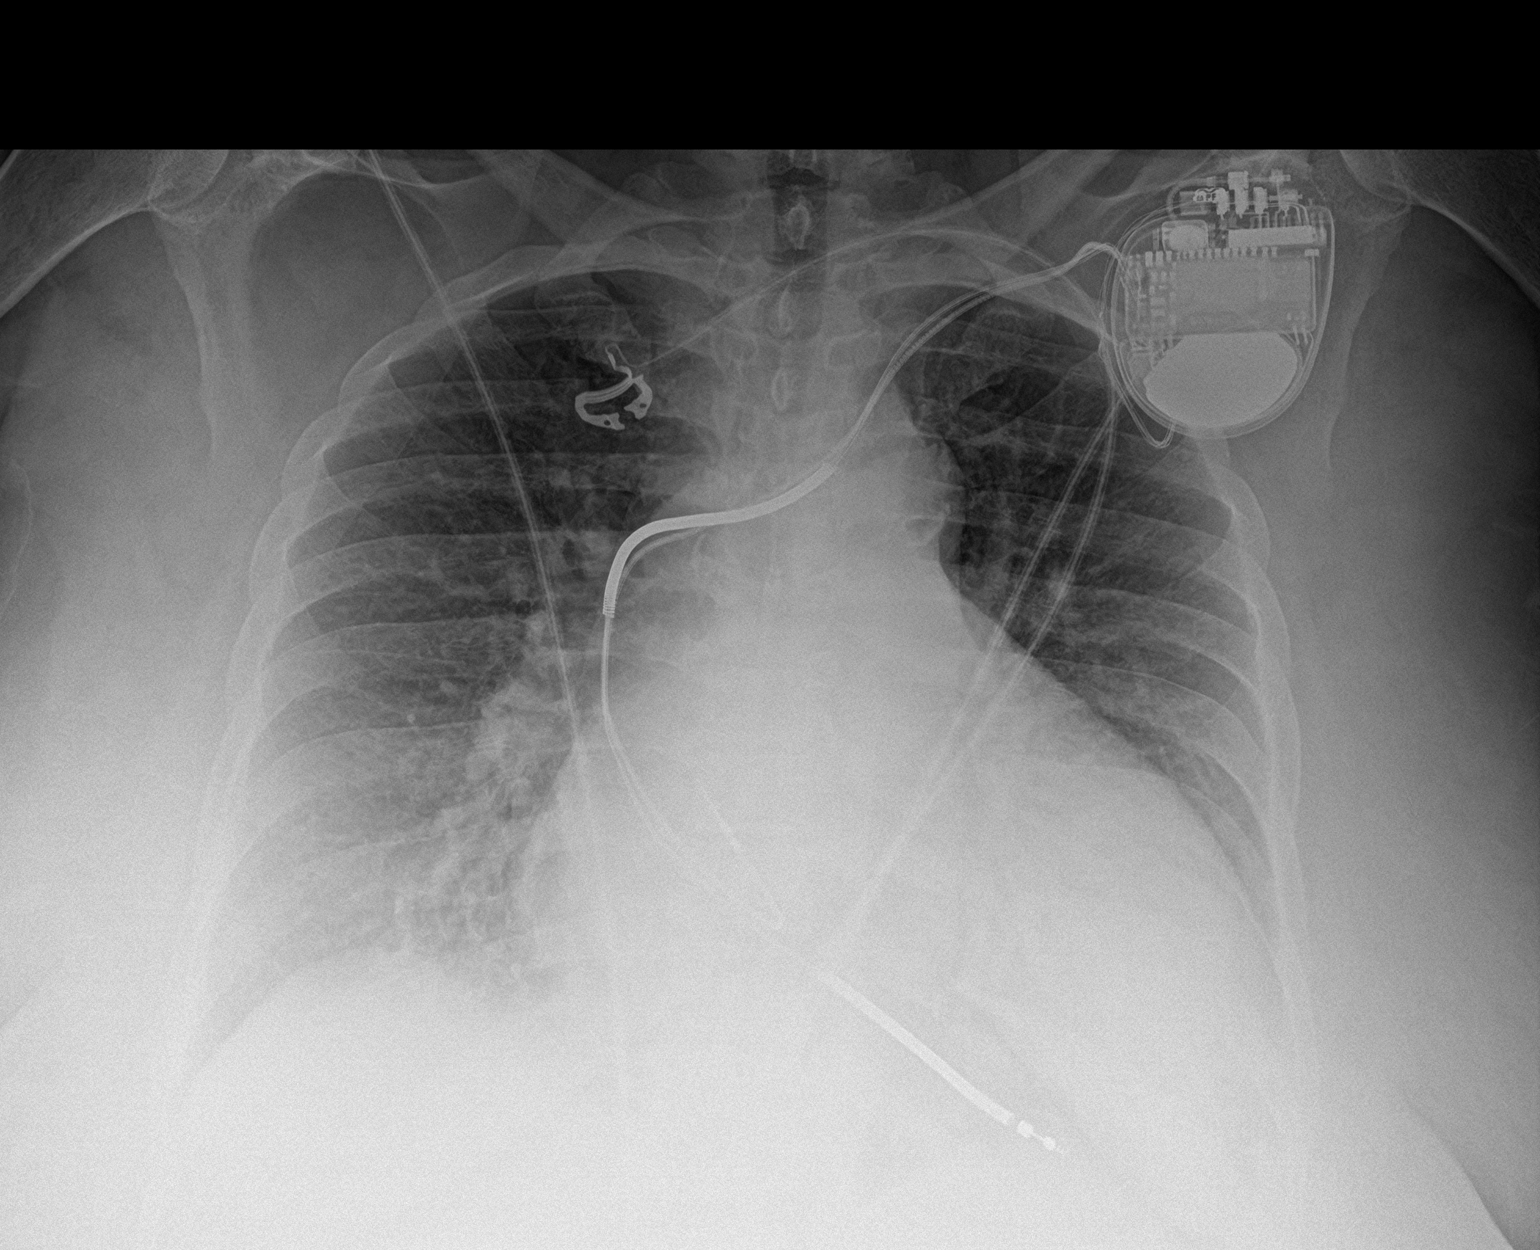

[2 of 2 positions shown; findings below may reference images not displayed]

FINDINGS: Cardiac shadow remains enlarged. Defibrillator is again noted and
stable. The lungs are well aerated bilaterally without focal
infiltrate or sizable effusion. Previously seen vascular congestion
has resolved in the interval. No bony abnormality is seen.
IMPRESSION: Stable cardiomegaly.  No acute abnormality noted.
